# Patient Record
Sex: Female | Born: 1955 | Race: White | Hispanic: No | Smoking: Former smoker
Health system: Southern US, Community
[De-identification: ages and names within clinical notes are randomized; demographics above are authoritative.]

## PROBLEM LIST (undated history)

## (undated) DIAGNOSIS — M810 Age-related osteoporosis without current pathological fracture: Secondary | ICD-10-CM

## (undated) DIAGNOSIS — Z8669 Personal history of other diseases of the nervous system and sense organs: Secondary | ICD-10-CM

## (undated) DIAGNOSIS — T7840XA Allergy, unspecified, initial encounter: Secondary | ICD-10-CM

## (undated) DIAGNOSIS — I452 Bifascicular block: Secondary | ICD-10-CM

## (undated) DIAGNOSIS — I10 Essential (primary) hypertension: Secondary | ICD-10-CM

## (undated) DIAGNOSIS — J439 Emphysema, unspecified: Secondary | ICD-10-CM

## (undated) DIAGNOSIS — J9383 Other pneumothorax: Secondary | ICD-10-CM

## (undated) DIAGNOSIS — H269 Unspecified cataract: Secondary | ICD-10-CM

## (undated) DIAGNOSIS — I2510 Atherosclerotic heart disease of native coronary artery without angina pectoris: Secondary | ICD-10-CM

## (undated) DIAGNOSIS — I351 Nonrheumatic aortic (valve) insufficiency: Secondary | ICD-10-CM

## (undated) HISTORY — DX: Other pneumothorax: J93.83

## (undated) HISTORY — PX: EYE SURGERY: SHX253

## (undated) HISTORY — DX: Emphysema, unspecified: J43.9

## (undated) HISTORY — PX: WISDOM TOOTH EXTRACTION: SHX21

## (undated) HISTORY — DX: Bifascicular block: I45.2

## (undated) HISTORY — PX: COLONOSCOPY: SHX174

## (undated) HISTORY — DX: Age-related osteoporosis without current pathological fracture: M81.0

## (undated) HISTORY — PX: CATARACT EXTRACTION W/ INTRAOCULAR LENS IMPLANT: SHX1309

## (undated) HISTORY — DX: Essential (primary) hypertension: I10

## (undated) HISTORY — DX: Allergy, unspecified, initial encounter: T78.40XA

## (undated) HISTORY — DX: Unspecified cataract: H26.9

## (undated) HISTORY — DX: Personal history of other diseases of the nervous system and sense organs: Z86.69

## (undated) HISTORY — DX: Nonrheumatic aortic (valve) insufficiency: I35.1

## (undated) HISTORY — DX: Atherosclerotic heart disease of native coronary artery without angina pectoris: I25.10

---

## 1999-02-06 ENCOUNTER — Other Ambulatory Visit: Admission: RE | Admit: 1999-02-06 | Discharge: 1999-02-06 | Payer: Self-pay | Admitting: Family Medicine

## 2000-01-29 ENCOUNTER — Other Ambulatory Visit: Admission: RE | Admit: 2000-01-29 | Discharge: 2000-01-29 | Payer: Self-pay | Admitting: Internal Medicine

## 2001-06-14 ENCOUNTER — Other Ambulatory Visit: Admission: RE | Admit: 2001-06-14 | Discharge: 2001-06-14 | Payer: Self-pay | Admitting: Family Medicine

## 2002-08-28 ENCOUNTER — Other Ambulatory Visit: Admission: RE | Admit: 2002-08-28 | Discharge: 2002-08-28 | Payer: Self-pay | Admitting: Family Medicine

## 2003-02-23 DIAGNOSIS — J9383 Other pneumothorax: Secondary | ICD-10-CM

## 2003-02-23 HISTORY — DX: Other pneumothorax: J93.83

## 2003-02-23 HISTORY — PX: CHEST TUBE INSERTION: SHX231

## 2003-02-23 HISTORY — PX: VIDEO ASSISTED THORACOSCOPY: SHX5073

## 2003-09-26 ENCOUNTER — Other Ambulatory Visit: Admission: RE | Admit: 2003-09-26 | Discharge: 2003-09-26 | Payer: Self-pay | Admitting: Family Medicine

## 2003-12-16 ENCOUNTER — Ambulatory Visit (HOSPITAL_COMMUNITY): Admission: RE | Admit: 2003-12-16 | Discharge: 2003-12-16 | Payer: Self-pay | Admitting: Surgery

## 2003-12-16 ENCOUNTER — Inpatient Hospital Stay (HOSPITAL_COMMUNITY): Admission: EM | Admit: 2003-12-16 | Discharge: 2003-12-20 | Payer: Self-pay | Admitting: Emergency Medicine

## 2003-12-24 ENCOUNTER — Encounter: Admission: RE | Admit: 2003-12-24 | Discharge: 2003-12-24 | Payer: Self-pay | Admitting: Surgery

## 2003-12-24 ENCOUNTER — Inpatient Hospital Stay (HOSPITAL_COMMUNITY): Admission: AD | Admit: 2003-12-24 | Discharge: 2003-12-28 | Payer: Self-pay | Admitting: Surgery

## 2003-12-25 ENCOUNTER — Encounter (INDEPENDENT_AMBULATORY_CARE_PROVIDER_SITE_OTHER): Payer: Self-pay | Admitting: Specialist

## 2004-01-02 ENCOUNTER — Encounter: Admission: RE | Admit: 2004-01-02 | Discharge: 2004-01-02 | Payer: Self-pay | Admitting: Cardiothoracic Surgery

## 2004-02-18 ENCOUNTER — Encounter: Admission: RE | Admit: 2004-02-18 | Discharge: 2004-02-18 | Payer: Self-pay | Admitting: Surgery

## 2004-10-05 ENCOUNTER — Other Ambulatory Visit: Admission: RE | Admit: 2004-10-05 | Discharge: 2004-10-05 | Payer: Self-pay | Admitting: Family Medicine

## 2004-10-05 ENCOUNTER — Ambulatory Visit: Payer: Self-pay | Admitting: Family Medicine

## 2005-11-24 ENCOUNTER — Encounter: Payer: Self-pay | Admitting: Family Medicine

## 2005-11-24 ENCOUNTER — Ambulatory Visit: Payer: Self-pay | Admitting: Family Medicine

## 2005-11-24 ENCOUNTER — Other Ambulatory Visit: Admission: RE | Admit: 2005-11-24 | Discharge: 2005-11-24 | Payer: Self-pay | Admitting: Family Medicine

## 2005-12-10 ENCOUNTER — Encounter: Payer: Self-pay | Admitting: Internal Medicine

## 2005-12-10 ENCOUNTER — Ambulatory Visit: Payer: Self-pay

## 2005-12-13 ENCOUNTER — Ambulatory Visit (HOSPITAL_COMMUNITY): Admission: RE | Admit: 2005-12-13 | Discharge: 2005-12-13 | Payer: Self-pay | Admitting: Internal Medicine

## 2005-12-13 ENCOUNTER — Ambulatory Visit: Payer: Self-pay | Admitting: Internal Medicine

## 2005-12-21 ENCOUNTER — Ambulatory Visit: Payer: Self-pay

## 2006-02-06 IMAGING — CR DG CHEST 1V PORT
1 series · 1 of 1 positions shown · non-contrast
Comparison: 12/16/2003.

CLINICAL DATA: Followup right pneumothorax.

PORTABLE CHEST - 1 VIEW

[view not recorded]
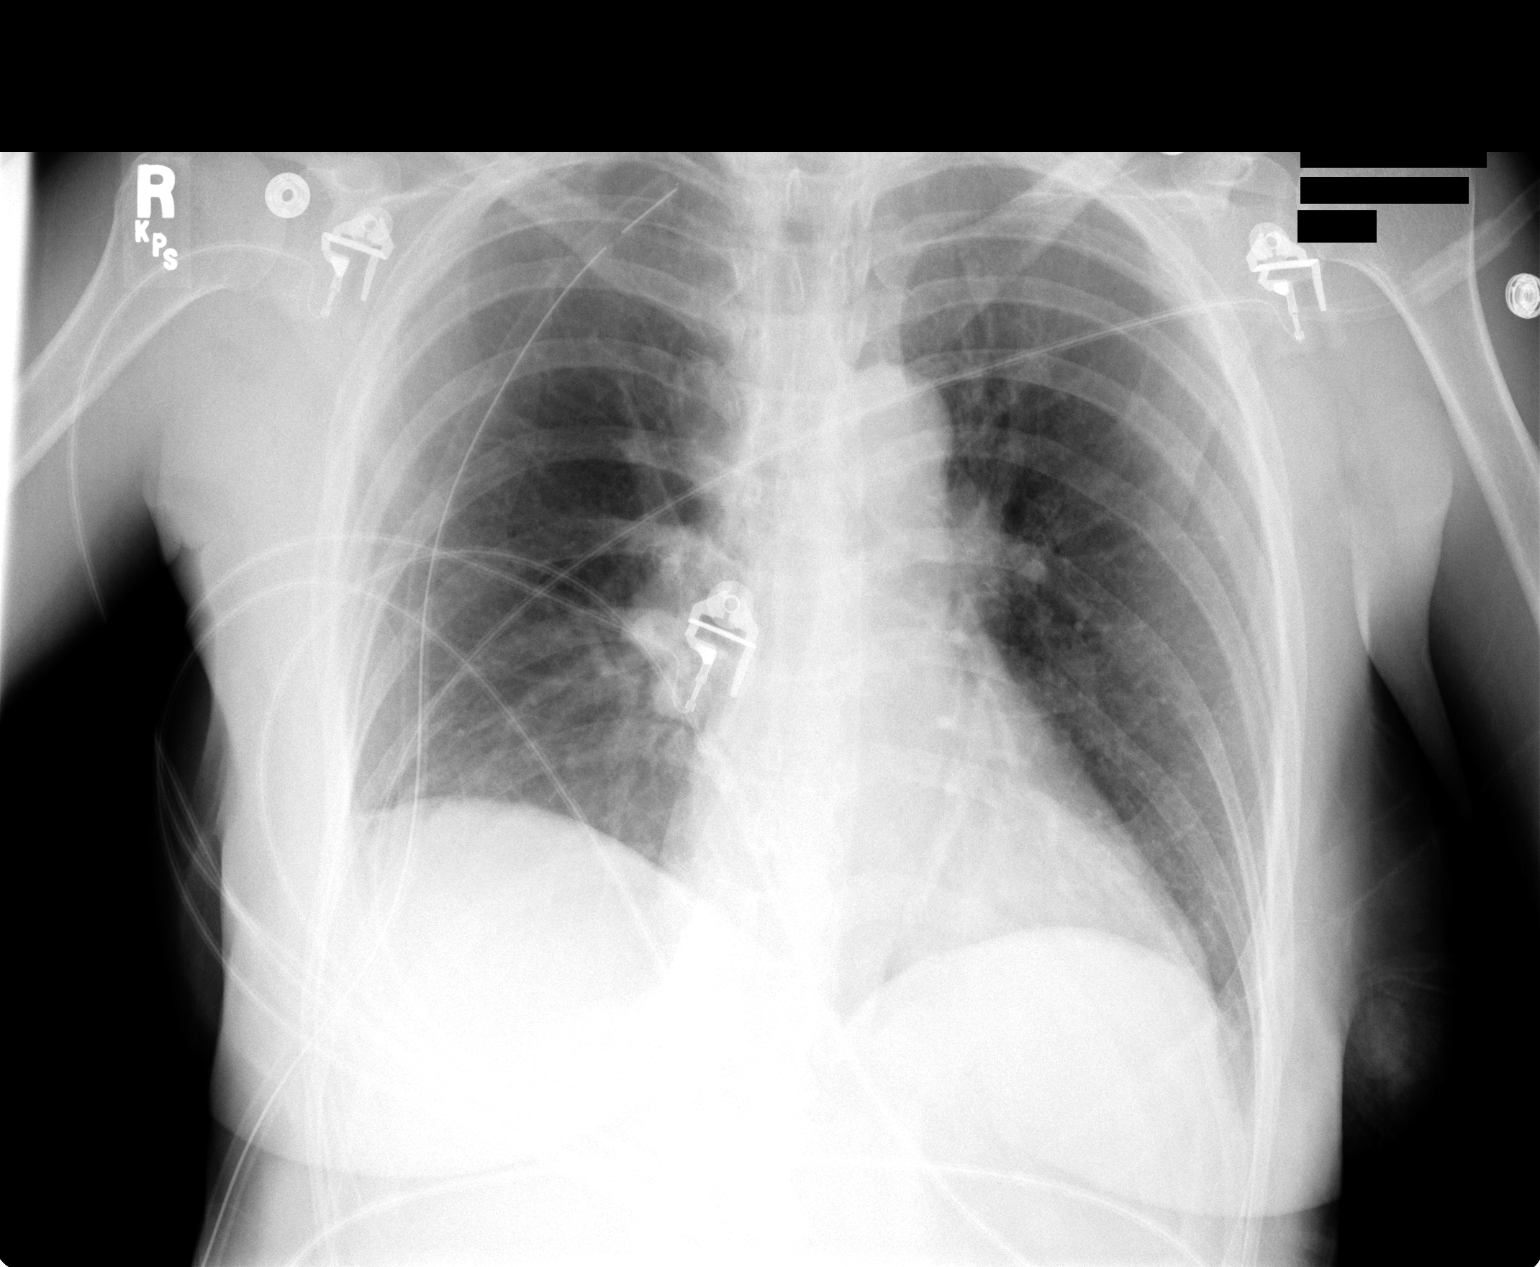

[1 of 1 positions shown; findings below may reference images not displayed]

FINDINGS: Stable small right apical pneumothorax. A right chest tube remains in place. Minimal
atelectasis at the right lateral costophrenic angle. Grossly normal sized heart. Minimally
prominent interstitial markings. Diffuse osteopenia.  

IMPRESSION

1. Stable less than 5% right apical pneumothorax. 

2. Minimal right basilar atelectasis.

3. Minimal chronic interstitial lung disease.

## 2006-02-13 IMAGING — CR DG CHEST 2V
2 series · 2 of 2 positions shown · non-contrast
Comparison: none

CLINICAL DATA: Spontaneous pneumothorax ? follow up.  Some shortness of breath. 
 CHEST X-RAY: 
 Two views of the chest are compared to a chest x-ray of 12/20/03 from [REDACTED].  There is a large right pneumothorax present of 60 to 70% with small right effusion.  The left lung is clear.  This report was called to Dr. Qaracaqiz?[REDACTED] at the time of interpretation.

[view not recorded (1 of 2)]
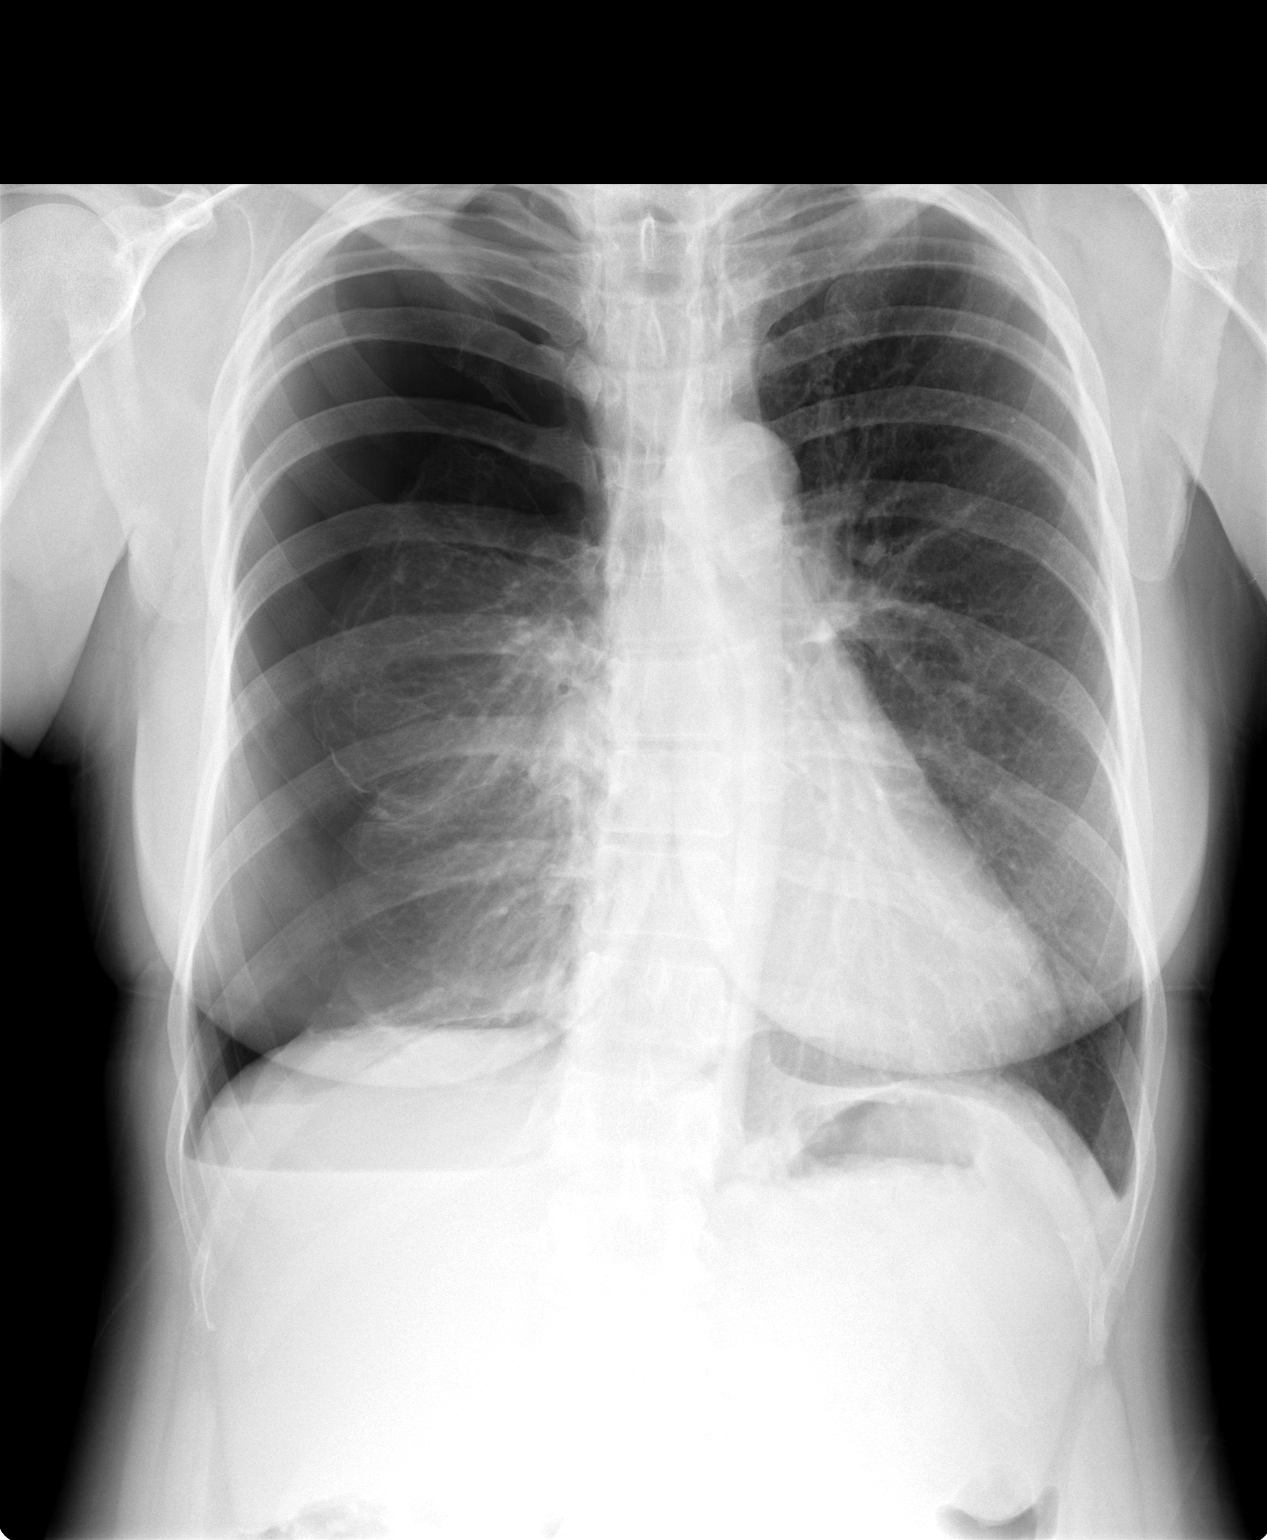

[view not recorded (2 of 2)]
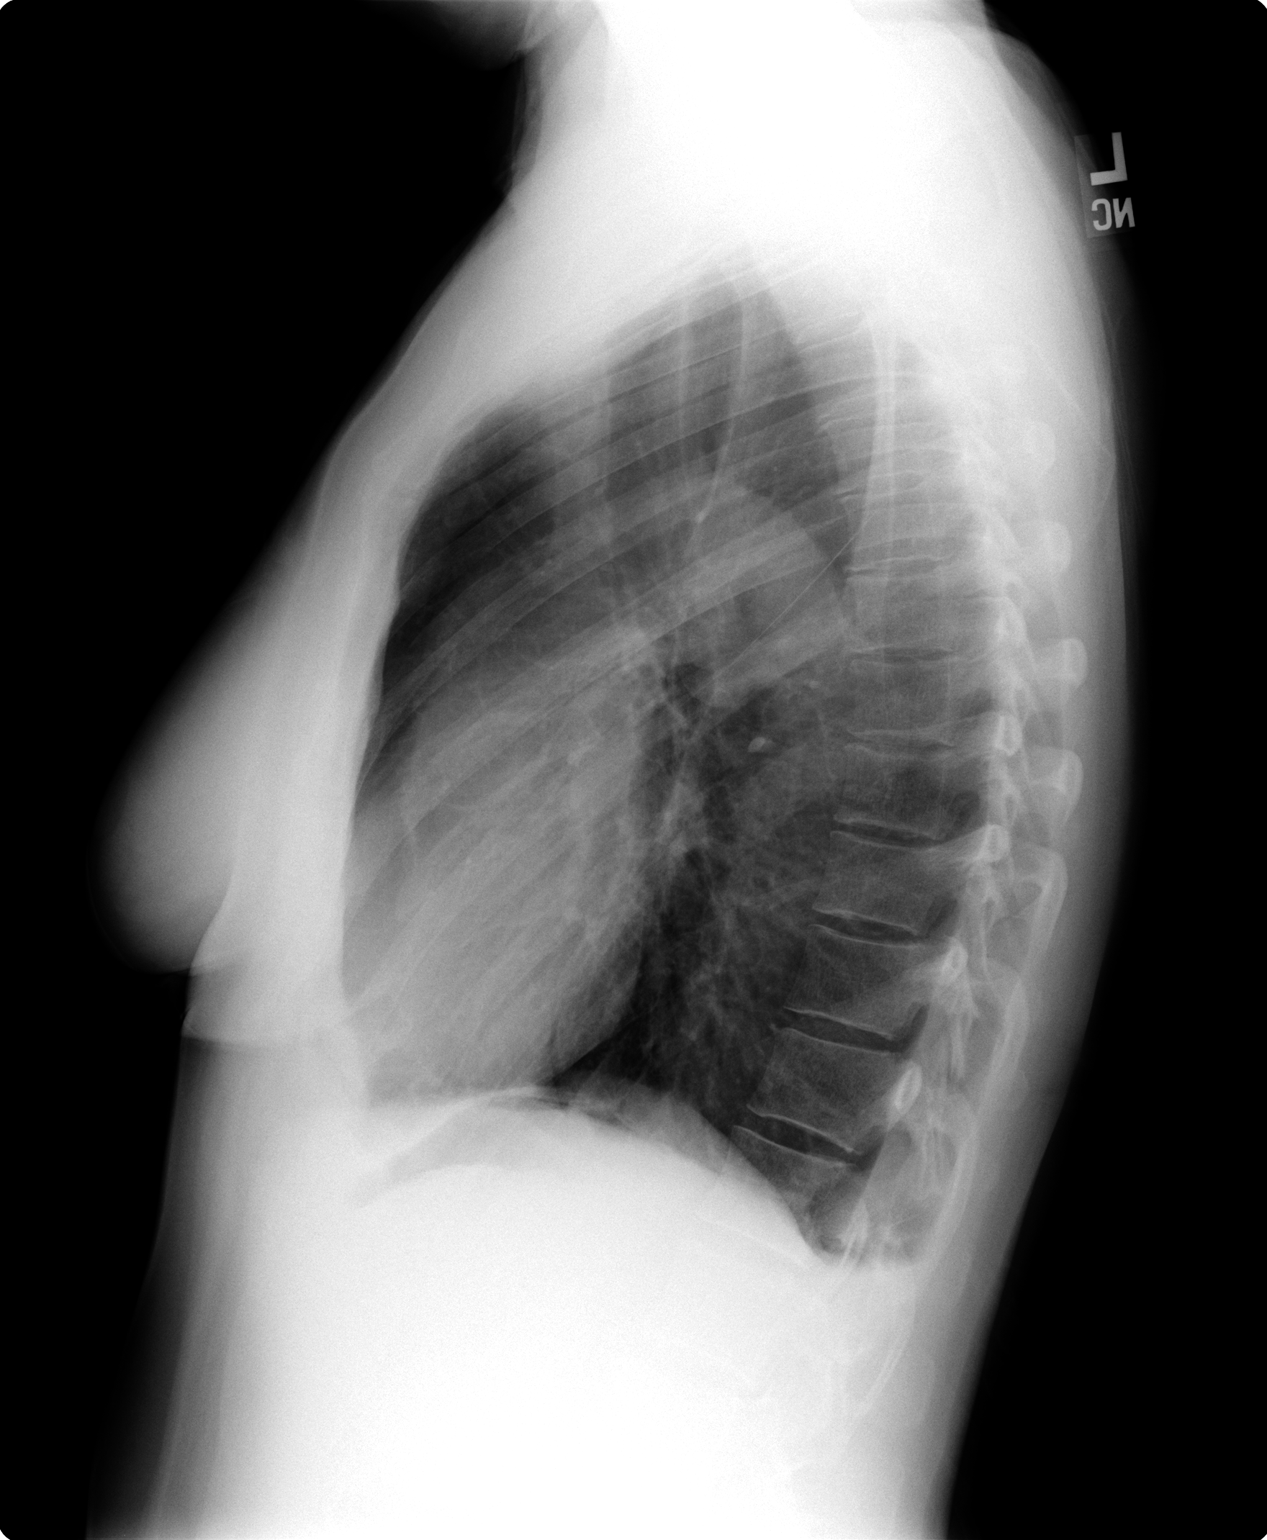

[2 of 2 positions shown; findings below may reference images not displayed]

IMPRESSION: Large right hydropneumothorax of at least 60 to 70%.

## 2006-03-23 ENCOUNTER — Ambulatory Visit: Payer: Self-pay | Admitting: Internal Medicine

## 2006-07-06 ENCOUNTER — Ambulatory Visit: Payer: Self-pay | Admitting: Family Medicine

## 2006-07-06 LAB — CONVERTED CEMR LAB
BUN: 11 mg/dL (ref 6–23)
CO2: 32 meq/L (ref 19–32)
Calcium: 8.9 mg/dL (ref 8.4–10.5)
Chloride: 108 meq/L (ref 96–112)
Creatinine, Ser: 0.7 mg/dL (ref 0.4–1.2)
GFR calc Af Amer: 113 mL/min
GFR calc non Af Amer: 93 mL/min
Glucose, Bld: 100 mg/dL — ABNORMAL HIGH (ref 70–99)
Potassium: 3.9 meq/L (ref 3.5–5.1)
Sodium: 143 meq/L (ref 135–145)

## 2006-10-07 ENCOUNTER — Encounter: Payer: Self-pay | Admitting: Family Medicine

## 2006-10-14 ENCOUNTER — Encounter: Payer: Self-pay | Admitting: Family Medicine

## 2006-12-21 ENCOUNTER — Telehealth (INDEPENDENT_AMBULATORY_CARE_PROVIDER_SITE_OTHER): Payer: Self-pay | Admitting: *Deleted

## 2007-01-12 ENCOUNTER — Telehealth (INDEPENDENT_AMBULATORY_CARE_PROVIDER_SITE_OTHER): Payer: Self-pay | Admitting: *Deleted

## 2007-01-13 ENCOUNTER — Ambulatory Visit: Payer: Self-pay | Admitting: Family Medicine

## 2007-01-13 DIAGNOSIS — N938 Other specified abnormal uterine and vaginal bleeding: Secondary | ICD-10-CM | POA: Insufficient documentation

## 2007-01-13 DIAGNOSIS — N949 Unspecified condition associated with female genital organs and menstrual cycle: Secondary | ICD-10-CM

## 2007-01-13 HISTORY — DX: Other specified abnormal uterine and vaginal bleeding: N93.8

## 2007-01-13 LAB — CONVERTED CEMR LAB: Estradiol: 24.2 pg/mL

## 2007-01-16 ENCOUNTER — Encounter (INDEPENDENT_AMBULATORY_CARE_PROVIDER_SITE_OTHER): Payer: Self-pay | Admitting: *Deleted

## 2007-01-16 LAB — CONVERTED CEMR LAB
Basophils Absolute: 0 10*3/uL (ref 0.0–0.1)
Basophils Relative: 1 % (ref 0–1)
Eosinophils Absolute: 0.2 10*3/uL (ref 0.2–0.7)
Eosinophils Relative: 4 % (ref 0–5)
FSH: 27.4 milliintl units/mL
HCT: 37.5 % (ref 36.0–46.0)
Hemoglobin: 13.2 g/dL (ref 12.0–15.0)
LH: 18.5 milliintl units/mL
Lymphocytes Relative: 34 % (ref 12–46)
Lymphs Abs: 1.7 10*3/uL (ref 0.7–4.0)
MCHC: 35.2 g/dL (ref 30.0–36.0)
MCV: 86.8 fL (ref 78.0–100.0)
Monocytes Absolute: 0.3 10*3/uL (ref 0.1–1.0)
Monocytes Relative: 7 % (ref 3–12)
Neutro Abs: 2.8 10*3/uL (ref 1.7–7.7)
Neutrophils Relative %: 56 % (ref 43–77)
Platelets: 202 10*3/uL (ref 150–400)
RBC: 4.32 M/uL (ref 3.87–5.11)
RDW: 12.2 % (ref 11.5–15.5)
TSH: 1.027 microintl units/mL (ref 0.350–5.50)
WBC: 4.9 10*3/uL (ref 4.0–10.5)

## 2007-01-18 ENCOUNTER — Encounter: Admission: RE | Admit: 2007-01-18 | Discharge: 2007-01-18 | Payer: Self-pay | Admitting: Family Medicine

## 2007-01-18 DIAGNOSIS — D259 Leiomyoma of uterus, unspecified: Secondary | ICD-10-CM

## 2007-01-18 HISTORY — DX: Leiomyoma of uterus, unspecified: D25.9

## 2007-01-27 ENCOUNTER — Encounter: Payer: Self-pay | Admitting: Family Medicine

## 2007-03-24 ENCOUNTER — Ambulatory Visit: Payer: Self-pay | Admitting: Family Medicine

## 2007-03-24 ENCOUNTER — Other Ambulatory Visit: Admission: RE | Admit: 2007-03-24 | Discharge: 2007-03-24 | Payer: Self-pay | Admitting: Family Medicine

## 2007-03-24 ENCOUNTER — Encounter: Payer: Self-pay | Admitting: Family Medicine

## 2007-03-24 DIAGNOSIS — J930 Spontaneous tension pneumothorax: Secondary | ICD-10-CM | POA: Insufficient documentation

## 2007-03-24 DIAGNOSIS — I10 Essential (primary) hypertension: Secondary | ICD-10-CM

## 2007-03-24 HISTORY — DX: Essential (primary) hypertension: I10

## 2007-03-24 HISTORY — DX: Spontaneous tension pneumothorax: J93.0

## 2007-03-28 ENCOUNTER — Telehealth (INDEPENDENT_AMBULATORY_CARE_PROVIDER_SITE_OTHER): Payer: Self-pay | Admitting: *Deleted

## 2007-05-25 ENCOUNTER — Ambulatory Visit: Payer: Self-pay | Admitting: Gastroenterology

## 2007-05-27 ENCOUNTER — Encounter: Payer: Self-pay | Admitting: Family Medicine

## 2007-06-07 ENCOUNTER — Encounter: Payer: Self-pay | Admitting: Family Medicine

## 2007-06-07 ENCOUNTER — Ambulatory Visit: Payer: Self-pay | Admitting: Gastroenterology

## 2007-10-16 ENCOUNTER — Encounter: Payer: Self-pay | Admitting: Family Medicine

## 2008-04-18 ENCOUNTER — Ambulatory Visit: Payer: Self-pay | Admitting: Family Medicine

## 2008-04-18 DIAGNOSIS — R928 Other abnormal and inconclusive findings on diagnostic imaging of breast: Secondary | ICD-10-CM | POA: Insufficient documentation

## 2008-04-26 ENCOUNTER — Ambulatory Visit: Payer: Self-pay | Admitting: Family Medicine

## 2008-04-26 ENCOUNTER — Encounter: Payer: Self-pay | Admitting: Family Medicine

## 2008-04-26 ENCOUNTER — Other Ambulatory Visit: Admission: RE | Admit: 2008-04-26 | Discharge: 2008-04-26 | Payer: Self-pay | Admitting: Internal Medicine

## 2008-05-02 ENCOUNTER — Encounter (INDEPENDENT_AMBULATORY_CARE_PROVIDER_SITE_OTHER): Payer: Self-pay | Admitting: *Deleted

## 2008-10-30 ENCOUNTER — Encounter: Payer: Self-pay | Admitting: Family Medicine

## 2009-05-05 ENCOUNTER — Other Ambulatory Visit: Admission: RE | Admit: 2009-05-05 | Discharge: 2009-05-05 | Payer: Self-pay | Admitting: Family Medicine

## 2009-05-05 ENCOUNTER — Ambulatory Visit: Payer: Self-pay | Admitting: Family Medicine

## 2009-05-13 ENCOUNTER — Encounter (INDEPENDENT_AMBULATORY_CARE_PROVIDER_SITE_OTHER): Payer: Self-pay | Admitting: *Deleted

## 2009-05-13 ENCOUNTER — Ambulatory Visit: Payer: Self-pay | Admitting: Family Medicine

## 2009-05-15 ENCOUNTER — Encounter (INDEPENDENT_AMBULATORY_CARE_PROVIDER_SITE_OTHER): Payer: Self-pay | Admitting: *Deleted

## 2009-05-15 LAB — CONVERTED CEMR LAB: Fecal Occult Bld: NEGATIVE

## 2009-10-31 ENCOUNTER — Encounter: Payer: Self-pay | Admitting: Family Medicine

## 2010-02-20 ENCOUNTER — Encounter: Payer: Self-pay | Admitting: Family Medicine

## 2010-03-14 ENCOUNTER — Encounter: Payer: Self-pay | Admitting: Thoracic Surgery (Cardiothoracic Vascular Surgery)

## 2010-03-22 LAB — CONVERTED CEMR LAB
ALT: 15 units/L (ref 0–35)
ALT: 15 units/L (ref 0–35)
ALT: 17 units/L (ref 0–35)
AST: 15 units/L (ref 0–37)
AST: 16 units/L (ref 0–37)
AST: 20 units/L (ref 0–37)
Albumin: 4 g/dL (ref 3.5–5.2)
Albumin: 4.2 g/dL (ref 3.5–5.2)
Albumin: 4.2 g/dL (ref 3.5–5.2)
Alkaline Phosphatase: 69 units/L (ref 39–117)
Alkaline Phosphatase: 75 units/L (ref 39–117)
Alkaline Phosphatase: 86 units/L (ref 39–117)
BUN: 12 mg/dL (ref 6–23)
BUN: 12 mg/dL (ref 6–23)
BUN: 8 mg/dL (ref 6–23)
Basophils Absolute: 0 10*3/uL (ref 0.0–0.1)
Basophils Absolute: 0 10*3/uL (ref 0.0–0.1)
Basophils Absolute: 0 10*3/uL (ref 0.0–0.1)
Basophils Relative: 0.1 % (ref 0.0–3.0)
Basophils Relative: 0.2 % (ref 0.0–1.0)
Basophils Relative: 0.8 % (ref 0.0–3.0)
Bilirubin Urine: NEGATIVE
Bilirubin Urine: NEGATIVE
Bilirubin, Direct: 0.1 mg/dL (ref 0.0–0.3)
Bilirubin, Direct: 0.1 mg/dL (ref 0.0–0.3)
Bilirubin, Direct: 0.1 mg/dL (ref 0.0–0.3)
Blood in Urine, dipstick: NEGATIVE
Blood in Urine, dipstick: NEGATIVE
CO2: 31 meq/L (ref 19–32)
CO2: 31 meq/L (ref 19–32)
CO2: 32 meq/L (ref 19–32)
Calcium: 8.9 mg/dL (ref 8.4–10.5)
Calcium: 9.4 mg/dL (ref 8.4–10.5)
Calcium: 9.6 mg/dL (ref 8.4–10.5)
Chloride: 105 meq/L (ref 96–112)
Chloride: 106 meq/L (ref 96–112)
Chloride: 107 meq/L (ref 96–112)
Cholesterol: 165 mg/dL (ref 0–200)
Cholesterol: 177 mg/dL (ref 0–200)
Cholesterol: 189 mg/dL (ref 0–200)
Creatinine, Ser: 0.6 mg/dL (ref 0.4–1.2)
Creatinine, Ser: 0.7 mg/dL (ref 0.4–1.2)
Creatinine, Ser: 0.8 mg/dL (ref 0.4–1.2)
Eosinophils Absolute: 0.2 10*3/uL (ref 0.0–0.6)
Eosinophils Absolute: 0.2 10*3/uL (ref 0.0–0.7)
Eosinophils Absolute: 0.2 10*3/uL (ref 0.0–0.7)
Eosinophils Relative: 3.8 % (ref 0.0–5.0)
Eosinophils Relative: 4.1 % (ref 0.0–5.0)
Eosinophils Relative: 4.4 % (ref 0.0–5.0)
GFR calc Af Amer: 113 mL/min
GFR calc Af Amer: 96 mL/min
GFR calc non Af Amer: 109.87 mL/min (ref >60)
GFR calc non Af Amer: 79 mL/min
GFR calc non Af Amer: 93 mL/min
Glucose, Bld: 90 mg/dL (ref 70–99)
Glucose, Bld: 99 mg/dL (ref 70–99)
Glucose, Bld: 99 mg/dL (ref 70–99)
Glucose, Urine, Semiquant: NEGATIVE
Glucose, Urine, Semiquant: NEGATIVE
HCT: 39.2 % (ref 36.0–46.0)
HCT: 39.3 % (ref 36.0–46.0)
HCT: 40.2 % (ref 36.0–46.0)
HDL: 58.7 mg/dL (ref >39.0)
HDL: 58.8 mg/dL (ref >39.0)
HDL: 64.9 mg/dL (ref >39.00)
Hemoglobin: 13.1 g/dL (ref 12.0–15.0)
Hemoglobin: 13.2 g/dL (ref 12.0–15.0)
Hemoglobin: 13.9 g/dL (ref 12.0–15.0)
Ketones, urine, test strip: NEGATIVE
Ketones, urine, test strip: NEGATIVE
LDL Cholesterol: 114 mg/dL — ABNORMAL HIGH (ref 0–99)
LDL Cholesterol: 96 mg/dL (ref 0–99)
LDL Cholesterol: 98 mg/dL (ref 0–99)
Lymphocytes Relative: 32.8 % (ref 12.0–46.0)
Lymphocytes Relative: 34.7 % (ref 12.0–46.0)
Lymphocytes Relative: 36.6 % (ref 12.0–46.0)
Lymphs Abs: 1.6 10*3/uL (ref 0.7–4.0)
MCHC: 33.6 g/dL (ref 30.0–36.0)
MCHC: 33.6 g/dL (ref 30.0–36.0)
MCHC: 34.6 g/dL (ref 30.0–36.0)
MCV: 89.8 fL (ref 78.0–100.0)
MCV: 90.1 fL (ref 78.0–100.0)
MCV: 91 fL (ref 78.0–100.0)
Monocytes Absolute: 0.3 10*3/uL (ref 0.1–1.0)
Monocytes Absolute: 0.3 10*3/uL (ref 0.2–0.7)
Monocytes Absolute: 0.4 10*3/uL (ref 0.1–1.0)
Monocytes Relative: 6.8 % (ref 3.0–11.0)
Monocytes Relative: 6.9 % (ref 3.0–12.0)
Monocytes Relative: 7.4 % (ref 3.0–12.0)
Neutro Abs: 2.3 10*3/uL (ref 1.4–7.7)
Neutro Abs: 2.6 10*3/uL (ref 1.4–7.7)
Neutro Abs: 2.8 10*3/uL (ref 1.4–7.7)
Neutrophils Relative %: 52 % (ref 43.0–77.0)
Neutrophils Relative %: 54.5 % (ref 43.0–77.0)
Neutrophils Relative %: 54.9 % (ref 43.0–77.0)
Nitrite: NEGATIVE
Nitrite: NEGATIVE
Pap Smear: NEGATIVE
Platelets: 202 10*3/uL (ref 150.0–400.0)
Platelets: 218 10*3/uL (ref 150–400)
Platelets: 224 10*3/uL (ref 150–400)
Potassium: 3.5 meq/L (ref 3.5–5.1)
Potassium: 4.2 meq/L (ref 3.5–5.1)
Potassium: 5.1 meq/L (ref 3.5–5.1)
Protein, U semiquant: NEGATIVE
Protein, U semiquant: NEGATIVE
RBC: 4.32 M/uL (ref 3.87–5.11)
RBC: 4.36 M/uL (ref 3.87–5.11)
RBC: 4.46 M/uL (ref 3.87–5.11)
RDW: 11.5 % (ref 11.5–14.6)
RDW: 11.5 % (ref 11.5–14.6)
RDW: 12.2 % (ref 11.5–14.6)
Sodium: 142 meq/L (ref 135–145)
Sodium: 142 meq/L (ref 135–145)
Sodium: 144 meq/L (ref 135–145)
Specific Gravity, Urine: <1.005
Specific Gravity, Urine: <1.005
TSH: 0.52 microintl units/mL (ref 0.35–5.50)
TSH: 0.9 microintl units/mL (ref 0.35–5.50)
TSH: 1.08 microintl units/mL (ref 0.35–5.50)
Total Bilirubin: 0.5 mg/dL (ref 0.3–1.2)
Total Bilirubin: 0.8 mg/dL (ref 0.3–1.2)
Total Bilirubin: 1.1 mg/dL (ref 0.3–1.2)
Total CHOL/HDL Ratio: 2.8
Total CHOL/HDL Ratio: 3
Total CHOL/HDL Ratio: 3.2
Total Protein: 7.1 g/dL (ref 6.0–8.3)
Total Protein: 7.5 g/dL (ref 6.0–8.3)
Total Protein: 7.7 g/dL (ref 6.0–8.3)
Triglycerides: 43 mg/dL (ref 0–149)
Triglycerides: 79 mg/dL (ref 0.0–149.0)
Triglycerides: 82 mg/dL (ref 0–149)
Urobilinogen, UA: NEGATIVE
Urobilinogen, UA: NEGATIVE
VLDL: 15.8 mg/dL (ref 0.0–40.0)
VLDL: 16 mg/dL (ref 0–40)
VLDL: 9 mg/dL (ref 0–40)
WBC Urine, dipstick: NEGATIVE
WBC Urine, dipstick: NEGATIVE
WBC: 4.4 10*3/uL — ABNORMAL LOW (ref 4.5–10.5)
WBC: 4.8 10*3/uL (ref 4.5–10.5)
WBC: 5 10*3/uL (ref 4.5–10.5)
pH: 5
pH: 6.5

## 2010-03-24 NOTE — Letter (Signed)
Summary: Results Follow up Letter  [facility] [place]  [address] [place], [place] [address]   Phone: [phone]  Fax: [phone]    05/15/2009 MRN: [id number]  [facility] [address] [place], [place]  [address]  Dear [name],  The following are the results of your recent test(s):  Test         Result    Pap Smear:        Normal _____  Not Normal _____ Comments: ______________________________________________________ Cholesterol: LDL(Bad cholesterol):         Your goal is less than:         HDL (Good cholesterol):       Your goal is more than: Comments:  ______________________________________________________ Mammogram:        Normal _____  Not Normal _____ Comments:  ___________________________________________________________________ Hemoccult:        Normal __X___  Not normal _______ Comments:    _____________________________________________________________________ Other Tests:    We routinely do not discuss normal results over the telephone.  If you desire a copy of the results, or you have any questions about this information we can discuss them at your next office visit.   Sincerely,

## 2010-03-24 NOTE — Assessment & Plan Note (Signed)
Summary: CPX & PAP, FASTING/RH......   Vital Signs:  Patient profile:   55 year old female Height:      63 inches Weight:      126 pounds BMI:     22.40 Temp:     97.7 degrees F oral Pulse rate:   65 / minute Pulse rhythm:   regular BP sitting:   124 / 72  (left arm) Cuff size:   regular  Vitals Entered By: [name] CMA (May 05, 2009 9:00 AM) CC: CPX, pap, [name] is fasting.   History of Present Illness: [name] here for cpe, labs, and pap.   [name] only complaint irregular periods---  she had 8 this year.  [name] saw [facility] gyn a few years ago.  They offered to start low dose hormones but she put is off then.  [name] will f/u with them if symptoms worsen.    Hypertension follow-up      This is a 55 year old woman who presents for Hypertension follow-up.  The patient denies lightheadedness, urinary frequency, headaches, edema, impotence, rash, and fatigue.  The patient denies the following associated symptoms: chest pain, chest pressure, exercise intolerance, dyspnea, palpitations, syncope, leg edema, and pedal edema.  Compliance with medications (by patient report) has been near 100%.  The patient reports that dietary compliance has been good.  The patient reports exercising 3-4X per week.  Adjunctive measures currently used by the patient include salt restriction.    Preventive Screening-Counseling & Management  Alcohol-Tobacco     Alcohol drinks/day: <1     Alcohol type: beer/ wine     Smoking Status: quit     Packs/Day: 1/2     Year Started: 1970     Year Quit: 1974     Pack years: 7     Passive Smoke Exposure: no  Caffeine-Diet-Exercise     Caffeine use/day: 1     Does Patient Exercise: yes     Type of exercise: jazzercise     Exercise (avg: min/session): 30-60     Times/week: 4  Hep-HIV-STD-Contraception     HIV Risk: no     Dental Visit-last 6 months yes     Dental Care Counseling: not indicated; dental care within six months     SBE monthly: yes     SBE  Education/Counseling: not indicated; SBE done regularly  Safety-Violence-Falls     Seat Belt Use: 100      Sexual History:  currently monogamous.    Current Medications (verified): 1)  Spironolactone 25 Mg  Tabs (Spironolactone) .Marland Kitchen.. 1 By Mouth Once Daily 2)  Diovan 160 Mg  Tabs (Valsartan) .Marland Kitchen.. 1 By Mouth Once Daily 3)  Flonase 50 Mcg/act Susp (Fluticasone Propionate) .... 2 Sprays Each Nostril Once Daily  Allergies (verified): No Known Drug Allergies  Past History:  Past Medical History: Last updated: 03/24/2007 Hypertension  Past Surgical History: Last updated: 03/24/2007 Caesarean section Tonsillectomy gum grafts  Family History: Last updated: 03/24/2007 MGM-- breast CA 72 Family History of Stroke F 1st degree relative 106 Family History of CAD Female 1st degree relative 1st MI in 52s Family History Hypertension  Social History: Last updated: 03/24/2007 Occupation: Musician for middle school Married Former Smoker Alcohol use-yes Drug use-no Regular exercise-yes  Risk Factors: Alcohol Use: <1 (05/05/2009) Caffeine Use: 1 (05/05/2009) Exercise: yes (05/05/2009)  Risk Factors: Smoking Status: quit (05/05/2009) Packs/Day: 1/2 (05/05/2009) Passive Smoke Exposure: no (05/05/2009)  Family History: Reviewed history from 03/24/2007 and no changes required. MGM-- breast  CA 72 Family History of Stroke F 1st degree relative 45 Family History of CAD Female 1st degree relative 1st MI in 48s Family History Hypertension  Social History: Reviewed history from 03/24/2007 and no changes required. 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 Eyes:  Denies blurring, discharge, double  vision, eye irritation, eye pain, halos, itching, light sensitivity, red eye, vision loss-1 eye, and vision loss-both eyes; optho-q2y. ENT:  Denies decreased hearing, difficulty swallowing, ear discharge, earache, hoarseness, nasal congestion, nosebleeds, postnasal drainage, ringing in ears, sinus pressure, and sore throat. CV:  Denies bluish discoloration of lips or nails, chest pain or discomfort, difficulty breathing at night, difficulty breathing while lying down, fainting, fatigue, leg cramps with exertion, lightheadness, near fainting, palpitations, shortness of breath with exertion, swelling of feet, swelling of hands, and weight gain. Resp:  Denies chest discomfort, chest pain with inspiration, cough, coughing up blood, excessive snoring, hypersomnolence, morning headaches, pleuritic, shortness of breath, sputum productive, and wheezing. GI:  Denies abdominal pain, bloody stools, change in bowel habits, constipation, dark tarry stools, diarrhea, excessive appetite, gas, hemorrhoids, indigestion, and loss of appetite. GU:  Complains of abnormal vaginal bleeding; denies decreased libido, discharge, dysuria, genital sores, hematuria, incontinence, nocturia, urinary frequency, and urinary hesitancy; periods are spreading out and irregular--[name] to see gyn. MS:  Denies joint pain, joint redness, joint swelling, loss of strength, low back pain, mid back pain, muscle aches, muscle , cramps, muscle weakness, stiffness, and thoracic pain. Derm:  Denies changes in color of skin, changes in nail beds, dryness, excessive perspiration, flushing, hair loss, insect bite(s), itching, lesion(s), poor wound healing, and rash. Neuro:  Denies brief paralysis, difficulty with concentration, disturbances in coordination, falling down, headaches, inability to speak, memory loss, numbness, poor balance, seizures, sensation of room spinning, tingling, tremors, visual disturbances, and weakness. Psych:  Denies alternate  hallucination ( auditory/visual), anxiety, depression, easily angered, easily tearful, irritability, mental problems, panic attacks, sense of great danger, suicidal thoughts/plans, thoughts of violence, unusual visions or sounds, and thoughts /plans of harming others. Endo:  Denies cold intolerance, excessive hunger, excessive thirst, excessive urination, heat intolerance, polyuria, and weight change. Heme:  Denies abnormal bruising, bleeding, enlarge lymph nodes, fevers, pallor, and skin discoloration.  Physical Exam  General:  Well-developed,well-nourished,in no acute distress; alert,appropriate and cooperative throughout examination Head:  Normocephalic and atraumatic without obvious abnormalities. No apparent alopecia or balding. Eyes:  vision grossly intact, pupils equal, pupils round, pupils reactive to light, and no injection.   Ears:  External ear exam shows no significant lesions or deformities.  Otoscopic examination reveals clear canals, tympanic membranes are intact bilaterally without bulging, retraction, inflammation or discharge. Hearing is grossly normal bilaterally. Nose:  External nasal examination shows no deformity or inflammation. Nasal mucosa are pink and moist without lesions or exudates. Mouth:  Oral mucosa and oropharynx without lesions or exudates.  Teeth in good repair. Neck:  No deformities, masses, or tenderness noted.no carotid bruits.   Chest Wall:  No deformities, masses, or tenderness noted. Breasts:  No mass, nodules, thickening, tenderness, bulging, retraction, inflamation, nipple discharge or skin changes noted.   Lungs:  Normal respiratory effort, chest expands symmetrically. Lungs are clear to auscultation, no crackles or wheezes. Heart:  Normal  rate and regular rhythm. S1 and S2 normal without gallop, murmur, click, rub or other extra sounds. Abdomen:  Bowel sounds positive,abdomen soft and non-tender without masses, organomegaly or hernias noted. Rectal:  No  external abnormalities noted. Normal sphincter tone. No rectal masses or tenderness. Genitalia:  Pelvic Exam:        External: normal female genitalia without lesions or masses        Vagina: normal without lesions or masses        Cervix: normal without lesions or masses        Adnexa: normal bimanual exam without masses or fullness        Uterus: normal by palpation        Pap smear: performed Msk:  normal ROM, no joint tenderness, no joint swelling, and no joint warmth.   Pulses:  R posterior tibial normal, R dorsalis pedis normal, R carotid normal, L posterior tibial normal, L dorsalis pedis normal, and L carotid normal.   Extremities:  No clubbing, cyanosis, edema, or deformity noted with normal full range of motion of all joints.   Neurologic:  No cranial nerve deficits noted. Station and gait are normal. Plantar reflexes are down-going bilaterally. DTRs are symmetrical throughout. Sensory, motor and coordinative functions appear intact. Skin:  Intact without suspicious lesions or rashes Cervical Nodes:  No lymphadenopathy noted Axillary Nodes:  No palpable lymphadenopathy Psych:  Oriented X3, normally interactive, good eye contact, not anxious appearing, and not depressed appearing.     Impression & Recommendations:  Problem # 1:  PREVENTIVE HEALTH CARE (ICD-V70.0) GHM utd sbe mammo done  Orders: Venipuncture (54098) TLB-Lipid Panel (80061-LIPID) TLB-BMP (Basic Metabolic Panel-BMET) (80048-METABOL) TLB-CBC Platelet - w/Differential (85025-CBCD) TLB-Hepatic/Liver Function Pnl (80076-HEPATIC) TLB-TSH (Thyroid Stimulating Hormone) (84443-TSH) EKG w/ Interpretation (93000)  Problem # 2:  HYPERTENSION (ICD-401.9)  Her updated medication list for this problem includes:    Spironolactone 25 Mg Tabs (Spironolactone) .Marland Kitchen... 1 by mouth once daily    Diovan 160 Mg Tabs (Valsartan) .Marland Kitchen... 1 by mouth once daily  BP today: 124/72 Prior BP: 130/78 (04/26/2008)  Labs Reviewed: K+:  5.1 (04/18/2008) Creat: : 0.8 (04/18/2008)   Chol: 189 (04/18/2008)   HDL: 58.7 (04/18/2008)   LDL: 114 (04/18/2008)   TG: 82 (04/18/2008)  Orders: EKG w/ Interpretation (93000)  Complete Medication List: 1)  Spironolactone 25 Mg Tabs (Spironolactone) .Marland Kitchen.. 1 by mouth once daily 2)  Diovan 160 Mg Tabs (Valsartan) .Marland Kitchen.. 1 by mouth once daily 3)  Flonase 50 Mcg/act Susp (Fluticasone propionate) .... 2 sprays each nostril once daily Prescriptions: FLONASE 50 MCG/ACT SUSP (FLUTICASONE PROPIONATE) 2 sprays each nostril once daily  #1 x 11   Entered and Authorized by:   Loreen Freud DO   Signed by:   Loreen Freud DO on 05/05/2009   Method used:   Electronically to        [id number] (retail)       [address]       [place], [place]  [address]       Ph: [phone] or [phone]       Fax: [phone]   RxID:   [id number] DIOVAN 160 MG  TABS (VALSARTAN) 1 by mouth once daily  #30 Tablet x 5   Entered and Authorized by:   Loreen Freud DO   Signed by:   Loreen Freud DO on 05/05/2009   Method used:   Electronically to  [facility]  [address] [id number]* (retail)       [address]       [place], [place]  [address]       Ph: [phone] or [phone]       Fax: [phone]   RxID:   [id number] SPIRONOLACTONE 25 MG  TABS (SPIRONOLACTONE) 1 by mouth once daily  #30 Tablet x 11   Entered and Authorized by:   Loreen Freud DO   Signed by:   Loreen Freud DO on 05/05/2009   Method used:   Electronically to        [id number] (retail)       [address]       [place], [place]  [address]       Ph: [phone] or [phone]       Fax: [phone]   RxID:   [id number]    EKG  Procedure date:  05/05/2009  Findings:      Sinus bradycardia with rate of:  49  EKG  Procedure date:  05/05/2009  Findings:      Right bundle branch block.       Last Flu Vaccine:   Fluvax 3+ (12/13/2007 9:41:57 AM) Flu Vaccine Result Date:  01/01/2009 Flu Vaccine Result:  given Flu Vaccine Next Due:  1 yr Last Mammogram:  Normal Bilateral---Dense breast -[facility] recc (10/10/2007 9:38:27 AM) Mammogram Result Date:  10/09/2008 Mammogram Result:  normal Mammogram Next Due:  1 yr  Appended Document: CPX & PAP, FASTING/RH......     Clinical Lists Changes  Observations: Added new observation of COMMENTS: [name] CMA  May 05, 2009 10:53 AM  (05/05/2009 10:53) Added new observation of PH URINE: 6.0  (05/05/2009 10:53) Added new observation of SPEC GR URIN: 1.015  (05/05/2009 10:53) Added new observation of APPEARANCE U: Clear  (05/05/2009 10:53) Added new observation of UA COLOR: yellow  (05/05/2009 10:53) Added new observation of WBC DIPSTK U: negative  (05/05/2009 10:53) Added new observation of NITRITE URN: negative  (05/05/2009 10:53) Added new observation of UROBILINOGEN: 0.2  (05/05/2009 10:53) Added new observation of PROTEIN, URN: negative  (05/05/2009 10:53) Added new observation of BLOOD UR DIP: negative  (05/05/2009 10:53) Added new observation of KETONES URN: negative  (05/05/2009 10:53) Added new observation of BILIRUBIN UR: negative  (05/05/2009 10:53) Added new observation of GLUCOSE, URN: negative  (05/05/2009 10:53)      Laboratory Results   Urine Tests    Routine Urinalysis   Color: yellow Appearance: Clear Glucose: negative   (Normal Range: Negative) Bilirubin: negative   (Normal Range: Negative) Ketone: negative   (Normal Range: Negative) Spec. Gravity: 1.015   (Normal Range: 1.003-1.035) Blood: negative   (Normal Range: Negative) pH: 6.0   (Normal Range: 5.0-8.0) Protein: negative   (Normal Range: Negative) Urobilinogen: 0.2   (Normal Range: 0-1) Nitrite: negative   (Normal Range: Negative) Leukocyte Esterace: negative   (Normal Range: Negative)    Comments: [name] CMA  May 05, 2009 10:53 AM

## 2010-03-24 NOTE — Letter (Signed)
Summary: [facility]: Immunoassay Fecal Occult Blood (iFOB) Order Form  [facility] [place]  [address] [place], [place] [address]   Phone: [phone]  Fax: [phone]      [facility]: Immunoassay Fecal Occult Blood (iFOB) Order Form   May 13, 2009 MRN: [id number]   [name] April 20, 1955   Physicican Name:_____Yvonne Lowne,DO____________________  Diagnosis Code:_____v76.51_____________________      [name] CMA

## 2010-03-26 NOTE — Medication Information (Signed)
Summary: Care Consideration Regarding Drugs Known to Cause Hyperkalemia/N  Care Consideration Regarding Drugs Known to Cause Hyperkalemia/[facility]   Imported By: Lanelle Bal 03/06/2010 13:53:25  _____________________________________________________________________  External Attachment:    Type:   Image     Comment:   External Document

## 2010-05-07 ENCOUNTER — Encounter: Payer: Self-pay | Admitting: Family Medicine

## 2010-05-07 ENCOUNTER — Other Ambulatory Visit: Payer: Self-pay | Admitting: Family Medicine

## 2010-05-07 ENCOUNTER — Other Ambulatory Visit (HOSPITAL_COMMUNITY)
Admission: RE | Admit: 2010-05-07 | Discharge: 2010-05-07 | Disposition: A | Discharge status: Home / Self Care | Payer: BC Managed Care – PPO | Source: Ambulatory Visit | Attending: Family Medicine | Admitting: Family Medicine

## 2010-05-07 ENCOUNTER — Encounter (INDEPENDENT_AMBULATORY_CARE_PROVIDER_SITE_OTHER): Payer: BC Managed Care – PPO | Admitting: Family Medicine

## 2010-05-07 DIAGNOSIS — D239 Other benign neoplasm of skin, unspecified: Secondary | ICD-10-CM

## 2010-05-07 DIAGNOSIS — Z0000 Encounter for general adult medical examination without abnormal findings: Secondary | ICD-10-CM

## 2010-05-07 DIAGNOSIS — I10 Essential (primary) hypertension: Secondary | ICD-10-CM

## 2010-05-07 DIAGNOSIS — Z01419 Encounter for gynecological examination (general) (routine) without abnormal findings: Secondary | ICD-10-CM | POA: Insufficient documentation

## 2010-05-07 DIAGNOSIS — E785 Hyperlipidemia, unspecified: Secondary | ICD-10-CM

## 2010-05-07 HISTORY — DX: Other benign neoplasm of skin, unspecified: D23.9

## 2010-05-07 LAB — LIPID PANEL
Cholesterol: 202 mg/dL — ABNORMAL HIGH (ref 0–200)
Total CHOL/HDL Ratio: 3
VLDL: 14.8 mg/dL (ref 0.0–40.0)

## 2010-05-07 LAB — BASIC METABOLIC PANEL
CO2: 31 mEq/L (ref 19–32)
Chloride: 103 mEq/L (ref 96–112)
Creatinine, Ser: 0.6 mg/dL (ref 0.4–1.2)
Potassium: 3.5 mEq/L (ref 3.5–5.1)

## 2010-05-07 LAB — CBC WITH DIFFERENTIAL/PLATELET
Basophils Relative: 0.7 % (ref 0.0–3.0)
Eosinophils Absolute: 0.2 10*3/uL (ref 0.0–0.7)
Eosinophils Relative: 3.1 % (ref 0.0–5.0)
HCT: 39.7 % (ref 36.0–46.0)
Hemoglobin: 14 g/dL (ref 12.0–15.0)
Lymphs Abs: 2.1 10*3/uL (ref 0.7–4.0)
MCHC: 35.3 g/dL (ref 30.0–36.0)
MCV: 89.4 fl (ref 78.0–100.0)
Monocytes Absolute: 0.3 10*3/uL (ref 0.1–1.0)
Neutro Abs: 2.7 10*3/uL (ref 1.4–7.7)
Neutrophils Relative %: 51.3 % (ref 43.0–77.0)
RBC: 4.44 Mil/uL (ref 3.87–5.11)
WBC: 5.3 10*3/uL (ref 4.5–10.5)

## 2010-05-07 LAB — LDL CHOLESTEROL, DIRECT: Direct LDL: 111.7 mg/dL

## 2010-05-07 LAB — CONVERTED CEMR LAB
Blood in Urine, dipstick: NEGATIVE
Glucose, Urine, Semiquant: NEGATIVE
Nitrite: NEGATIVE
Specific Gravity, Urine: <1.005
WBC Urine, dipstick: NEGATIVE
pH: 6

## 2010-05-07 LAB — HEPATIC FUNCTION PANEL
ALT: 13 U/L (ref 0–35)
Albumin: 4.5 g/dL (ref 3.5–5.2)
Bilirubin, Direct: 0.1 mg/dL (ref 0.0–0.3)
Total Protein: 7.3 g/dL (ref 6.0–8.3)

## 2010-05-07 LAB — TSH: TSH: 0.73 u[IU]/mL (ref 0.35–5.50)

## 2010-05-12 NOTE — Assessment & Plan Note (Signed)
Summary: cpx/pt will be fasting/lch   Vital Signs:  Patient profile:   55 year old female LMP:     04/21/2010 Height:      63 inches Weight:      124 pounds BMI:     22.05 Temp:     98.6 degrees F oral Pulse rate:   56 / minute Resp:     18 per minute BP sitting:   130 / 82  (left arm)  Vitals Entered By: Jeremy Johann CMA (May 07, 2010 1:04 PM) CC: cpx, fasting, pap LMP (date): 04/21/2010     Enter LMP: 04/21/2010 Last PAP Result NEGATIVE FOR INTRAEPITHELIAL LESIONS OR MALIGNANCY.   History of Present Illness: Pt here for cpe, pap and fasting labs.    No complaints.     Preventive Screening-Counseling & Management  Alcohol-Tobacco     Alcohol drinks/day: <1     Alcohol type: beer/ wine     Smoking Status: quit     Packs/Day: 1/2     Year Started: 1970     Year Quit: 1974     Pack years: 7     Passive Smoke Exposure: no  Caffeine-Diet-Exercise     Caffeine use/day: 1     Does Patient Exercise: yes     Type of exercise: jazzercise     Exercise (avg: min/session): 30-60     Times/week: 4  Hep-HIV-STD-Contraception     HIV Risk: no     Dental Visit-last 6 months yes     Dental Care Counseling: not indicated; dental care within six months     SBE monthly: yes     SBE Education/Counseling: not indicated; SBE done regularly  Safety-Violence-Falls     Seat Belt Use: 100      Sexual History:  currently monogamous.        Drug Use:  no.    Problems Prior to Update: 1)  Mole  (ICD-216.9) 2)  Routine Gynecological Examination  (ICD-V72.31) 3)  Mammogram, Abnormal  (ICD-793.80) 4)  Preventive Health Care  (ICD-V70.0) 5)  Family History of Cad Female 1st Degree Relative <50  (ICD-V17.3) 6)  Spontaneous Tension Pneumothorax  (ICD-512.0) 7)  Hypertension  (ICD-401.9) 8)  Fibroids, Uterus  (ICD-218.9) 9)  Menorrhagia, Perimenopausal  (ICD-626.8)  Medications Prior to Update: 1)  Spironolactone 25 Mg  Tabs (Spironolactone) .Marland Kitchen.. 1 By Mouth Once Daily 2)   Diovan 160 Mg  Tabs (Valsartan) .Marland Kitchen.. 1 By Mouth Once Daily 3)  Flonase 50 Mcg/act Susp (Fluticasone Propionate) .... 2 Sprays Each Nostril Once Daily  Current Medications (verified): 1)  Spironolactone 25 Mg  Tabs (Spironolactone) .Marland Kitchen.. 1 By Mouth Once Daily 2)  Diovan 160 Mg  Tabs (Valsartan) .Marland Kitchen.. 1 By Mouth Once Daily 3)  Flonase 50 Mcg/act Susp (Fluticasone Propionate) .... 2 Sprays Each Nostril Once Daily  Allergies (verified): No Known Drug Allergies  Past History:  Past Medical History: Last updated: 03/24/2007 Hypertension  Past Surgical History: Last updated: 03/24/2007 Caesarean section Tonsillectomy gum grafts  Family History: Last updated: 03/24/2007 MGM-- breast CA 72 Family History of Stroke F 1st degree relative 87 Family History of CAD Female 1st degree relative 1st MI in 6s Family History Hypertension  Social History: Last updated: 03/24/2007 Occupation: Musician for middle school Married Former Smoker Alcohol use-yes Drug use-no Regular exercise-yes  Risk Factors: Alcohol Use: <1 (05/07/2010) Caffeine Use: 1 (05/07/2010) Exercise: yes (05/07/2010)  Risk Factors: Smoking Status: quit (05/07/2010) Packs/Day: 1/2 (05/07/2010) Passive Smoke Exposure: no (05/07/2010)  Family History: Reviewed history from 03/24/2007 and no changes required. MGM-- breast CA 57 Family History of Stroke F 1st degree relative 76 Family History of CAD Female 1st degree relative 1st MI in 14s Family History Hypertension  Social History: Reviewed history from 03/24/2007 and no changes required. 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 Eyes:  Denies blurring, discharge, double vision, eye irritation, eye pain, halos, itching, light sensitivity, red  eye, vision loss-1 eye, and vision loss-both eyes; optho q2y. ENT:  Denies decreased hearing, difficulty swallowing, ear discharge, earache, hoarseness, nasal congestion, nosebleeds, postnasal drainage, ringing in ears, sinus pressure, and sore throat. CV:  Denies bluish discoloration of lips or nails, chest pain or discomfort, difficulty breathing at night, difficulty breathing while lying down, fainting, fatigue, leg cramps with exertion, lightheadness, near fainting, palpitations, shortness of breath with exertion, swelling of feet, swelling of hands, and weight gain. Resp:  Denies chest discomfort, chest pain with inspiration, cough, coughing up blood, excessive snoring, hypersomnolence, morning headaches, pleuritic, shortness of breath, sputum productive, and wheezing. GI:  Denies abdominal pain, bloody stools, change in bowel habits, constipation, dark tarry stools, diarrhea, excessive appetite, gas, hemorrhoids, indigestion, loss of appetite, nausea, vomiting, vomiting blood, and yellowish skin color. GU:  Denies abnormal vaginal bleeding, decreased libido, discharge, dysuria, genital sores, hematuria, incontinence, nocturia, urinary frequency, and urinary hesitancy. MS:  Denies joint pain, joint redness, joint swelling, loss of strength, low back pain, mid back pain, muscle aches, muscle , cramps, muscle weakness, stiffness, and thoracic pain. Derm:  Denies changes in color of skin, changes in nail beds, dryness, excessive perspiration, flushing, hair loss, insect bite(s), itching, lesion(s), poor wound healing, and rash. Neuro:  Denies brief paralysis, difficulty with concentration, disturbances in coordination, falling down, headaches, inability to speak, memory loss, numbness, poor balance, seizures, sensation of room spinning, tingling, tremors, visual disturbances, and weakness. Psych:  Denies alternate hallucination ( auditory/visual), anxiety, depression, easily angered, easily tearful,  irritability, mental problems, panic attacks, sense of great danger, suicidal thoughts/plans, thoughts of violence, unusual visions or sounds, and thoughts /plans of harming others. Endo:  Denies cold intolerance, excessive hunger, excessive thirst, excessive urination, heat intolerance, polyuria, and weight change. Heme:  Denies abnormal bruising, bleeding, enlarge lymph nodes, fevers, pallor, and skin discoloration. Allergy:  Denies hives or rash, itching eyes, persistent infections, seasonal allergies, and sneezing.  Physical Exam  General:  Well-developed,well-nourished,in no acute distress; alert,appropriate and cooperative throughout examination Head:  Normocephalic and atraumatic without obvious abnormalities. No apparent alopecia or balding. Eyes:  pupils equal, pupils round, pupils reactive to light, and no injection.   Ears:  External ear exam shows no significant lesions or deformities.  Otoscopic examination reveals clear canals, tympanic membranes are intact bilaterally without bulging, retraction, inflammation or discharge. Hearing is grossly normal bilaterally. Nose:  External nasal examination shows no deformity or inflammation. Nasal mucosa are pink and moist without lesions or exudates. Mouth:  Oral mucosa and oropharynx without lesions or exudates.  Teeth in good repair. Neck:  No deformities, masses, or tenderness noted. Chest Wall:  No deformities, masses, or tenderness noted. Breasts:  No mass, nodules, thickening, tenderness, bulging, retraction, inflamation, nipple discharge or skin changes noted.   Lungs:  Normal respiratory effort, chest expands symmetrically. Lungs are clear to auscultation, no crackles or  wheezes. Heart:  normal rate and no murmur.   Abdomen:  Bowel sounds positive,abdomen soft and non-tender without masses, organomegaly or hernias noted. Rectal:  No external abnormalities noted. Normal sphincter tone. No rectal masses or tenderness. Genitalia:  Pelvic  Exam:        External: normal female genitalia without lesions or masses        Vagina: normal without lesions or masses        Cervix: normal without lesions or masses        Adnexa: normal bimanual exam without masses or fullness        Uterus: normal by palpation        Pap smear: performed Msk:  No deformity or scoliosis noted of thoracic or lumbar spine.   Pulses:  R and L carotid,radial,femoral,dorsalis pedis and posterior tibial pulses are full and equal bilaterally Extremities:  No clubbing, cyanosis, edema, or deformity noted with normal full range of motion of all joints.   Neurologic:  No cranial nerve deficits noted. Station and gait are normal. Plantar reflexes are down-going bilaterally. DTRs are symmetrical throughout. Sensory, motor and coordinative functions appear intact. Skin:  Intact without suspicious lesions or rashes Cervical Nodes:  No lymphadenopathy noted Axillary Nodes:  No palpable lymphadenopathy Psych:  Cognition and judgment appear intact. Alert and cooperative with normal attention span and concentration. No apparent delusions, illusions, hallucinations   Impression & Recommendations:  Problem # 1:  PREVENTIVE HEALTH CARE (ICD-V70.0) ghm utd  Orders: Venipuncture (52841) TLB-Lipid Panel (80061-LIPID) TLB-BMP (Basic Metabolic Panel-BMET) (80048-METABOL) TLB-CBC Platelet - w/Differential (85025-CBCD) TLB-Hepatic/Liver Function Pnl (80076-HEPATIC) TLB-TSH (Thyroid Stimulating Hormone) (84443-TSH) Specimen Handling (32440) Specimen Handling (10272) EKG w/ Interpretation (93000)  Problem # 2:  MOLE (ICD-216.9)  Orders: Dermatology Referral (Derma) EKG w/ Interpretation (93000)  Problem # 3:  HYPERTENSION (ICD-401.9)  Her updated medication list for this problem includes:    Spironolactone 25 Mg Tabs (Spironolactone) .Marland Kitchen... 1 by mouth once daily    Diovan 160 Mg Tabs (Valsartan) .Marland Kitchen... 1 by mouth once daily  Orders: Venipuncture (53664) TLB-Lipid  Panel (80061-LIPID) TLB-BMP (Basic Metabolic Panel-BMET) (80048-METABOL) TLB-CBC Platelet - w/Differential (85025-CBCD) TLB-Hepatic/Liver Function Pnl (80076-HEPATIC) TLB-TSH (Thyroid Stimulating Hormone) (84443-TSH) Specimen Handling (40347) EKG w/ Interpretation (93000)  BP today: 130/82 Prior BP: 124/72 (05/05/2009)  Labs Reviewed: K+: 3.5 (05/05/2009) Creat: : 0.6 (05/05/2009)   Chol: 177 (05/05/2009)   HDL: 64.90 (05/05/2009)   LDL: 96 (05/05/2009)   TG: 79.0 (05/05/2009)  Complete Medication List: 1)  Spironolactone 25 Mg Tabs (Spironolactone) .Marland Kitchen.. 1 by mouth once daily 2)  Diovan 160 Mg Tabs (Valsartan) .Marland Kitchen.. 1 by mouth once daily 3)  Flonase 50 Mcg/act Susp (Fluticasone propionate) .... 2 sprays each nostril once daily  Patient Instructions: 1)  Please schedule a follow-up appointment in 6 months .  Prescriptions: DIOVAN 160 MG  TABS (VALSARTAN) 1 by mouth once daily  #90 x 3   Entered and Authorized by:   Loreen Freud DO   Signed by:   Loreen Freud DO on 05/07/2010   Method used:   Print then Give to Patient   RxID:   [id number] FLONASE 50 MCG/ACT SUSP (FLUTICASONE PROPIONATE) 2 sprays each nostril once daily  #1 x 11   Entered and Authorized by:   Loreen Freud DO   Signed by:   Loreen Freud DO on 05/07/2010   Method used:   Electronically to        [id number] (retail)  [address]       [place], [place]  [address]       Ph: [phone] or [phone]       Fax: [phone]   RxID:   [id number] SPIRONOLACTONE 25 MG  TABS (SPIRONOLACTONE) 1 by mouth once daily  #30 Tablet x 11   Entered and Authorized by:   Loreen Freud DO   Signed by:   Loreen Freud DO on 05/07/2010   Method used:   Electronically to        [id number]* (retail)       [address]       [place], [place]  [address]       Ph: [phone] or [phone]       Fax: [phone]   RxID:    [id number]    Orders Added: 1)  Venipuncture [57322] 2)  TLB-Lipid Panel [80061-LIPID] 3)  TLB-BMP (Basic Metabolic Panel-BMET) [80048-METABOL] 4)  TLB-CBC Platelet - w/Differential [85025-CBCD] 5)  TLB-Hepatic/Liver Function Pnl [80076-HEPATIC] 6)  TLB-TSH (Thyroid Stimulating Hormone) [84443-TSH] 7)  Specimen Handling [99000] 8)  Specimen Handling [99000] 9)  Dermatology Referral [Derma] 10)  Est. Patient 40-64 years [99396] 11)  EKG w/ Interpretation [93000]    Last Flu Vaccine:  given (01/01/2009 9:34:18 AM) Flu Vaccine Result Date:  12/31/2009 Flu Vaccine Result:  given Flu Vaccine Next Due:  1 yr Last Mammogram:  normal (10/09/2008 9:34:48 AM) Mammogram Result Date:  11/19/2009 Mammogram Result:  normal Mammogram Next Due:  1 yr   Laboratory Results   Urine Tests   Date/Time Reported: May 07, 2010 2:28 PM   Routine Urinalysis   Color: lt. yellow Appearance: Clear Glucose: negative   (Normal Range: Negative) Bilirubin: negative   (Normal Range: Negative) Ketone: negative   (Normal Range: Negative) Spec. Gravity: <1.005   (Normal Range: 1.003-1.035) Blood: negative   (Normal Range: Negative) pH: 6.0   (Normal Range: 5.0-8.0) Protein: negative   (Normal Range: Negative) Urobilinogen: negative   (Normal Range: 0-1) Nitrite: negative   (Normal Range: Negative) Leukocyte Esterace: negative   (Normal Range: Negative)    Comments: Floydene Flock  May 07, 2010 2:29 PM

## 2010-05-24 DIAGNOSIS — Z8669 Personal history of other diseases of the nervous system and sense organs: Secondary | ICD-10-CM

## 2010-05-24 HISTORY — DX: Personal history of other diseases of the nervous system and sense organs: Z86.69

## 2010-07-10 NOTE — Assessment & Plan Note (Signed)
[facility]                            CARDIOLOGY OFFICE NOTE   ADIBA, FARGNOLI                        MRN:          [id number]  DATE:03/23/2006                            DOB:          1955/07/07    REFERRING PHYSICIAN:  Loreen Freud, M.D.   INTERVAL HISTORY:  Ms. Lastra is a delightful 55 year old woman whom I saw  back in October for apparently new right bundle branch block and right-  sided chest pain, which was fairly atypical.  She has a history of  spontaneous right pneumothorax and was status post stapling of blebs.  Also had a history of hypertension.  Since we saw her, she underwent an  echocardiogram that showed an EF of 65%.  No valvular abnormalities.  She also underwent a Myoview, which showed an EF of 80%.  There was good  exercise capacity and a normal perfusion.   She returns today for routine followup.  She has been exercising 3 or 4  times a week with Jazzercise with no chest pain or shortness of breath.  She has no complaints.  She is doing well.  Her blood pressure has been  well controlled since switching her Diovan to 160 a day.  We did start  her on potassium for some hypokalemia.   CURRENT MEDICATIONS:  1. Spironolactone 25 a day.  2. Diovan 160 a day.  3. Potassium.   PHYSICAL EXAM:  She is vigorous-appearing, ambulates around the clinic  without any respiratory difficulty.  Blood pressure is 104/72, heart rate 61, weight 122.  HEENT:  Sclerae anicteric, EOMI.  There is no xanthelasma.  Mucous  membranes are moist.  NECK:  Supple.  No JVD.  Carotids are 2+ bilaterally without bruits.  There is no lymphadenopathy or thyromegaly.  CARDIAC:  Regular rate and rhythm.  No murmurs, rubs, or gallops.  LUNGS:  Clear.  ABDOMEN:  Soft, nontender, nondistended.  No hepatosplenomegaly.  No  bruits.  No masses.  Good bowel sounds.  EXTREMITIES:  Warm with no cyanosis, clubbing, or edema.  There is no  rash.  NEURO:  She is  alert and oriented x3.  Cranial nerves 2-12 are intact.  Moves all 4 extremities without difficulty.  Affect is pleasant.   EKG:  Sinus rhythm with a right bundle branch block at a rate of 61.  There is no ST-T wave abnormality.   ASSESSMENT AND PLAN:  1. Chest pain.  This is resolved.  Her Myoview is negative.  I do not      think she has significant underlying coronary artery disease.  We      will continue to follow her on a p.r.n. basis.  2. Hypertension, well controlled.  She will follow up with Dr. Laury Axon.   DISPOSITION:  She can follow up on a p.r.n. basis for any problems.     Bevelyn Buckles. Bensimhon, MD  Electronically Signed    DRB/MedQ  DD: 03/23/2006  DT: 03/23/2006  Job #: [id number]   cc:   Loreen Freud, M.D.

## 2010-07-10 NOTE — Assessment & Plan Note (Signed)
[facility]                              CARDIOLOGY OFFICE NOTE   [name], [name]                        MRN:          [id number]  DATE:12/13/2005                            DOB:          1956/01/30    REASON FOR CONSULTATION:  New right bundle-branch block.   HISTORY OF PRESENT ILLNESS:  [name] is a delightful 55 year old woman  with a history of hypertension as well as a spontaneous right pneumothorax  in 2005 status post stapling of blebs who presents today for further  evaluation of a new right bundle-branch block.  She denies any history of  known cardiovascular disease.  She has never had a stress test or a cardiac  catheterization.  As above, she did undergo stapling of blebs for a  spontaneous pneumothorax in 2005.  She currently is very active with  Jazzercise, going 3 days a week for an hour at a time without any chest pain  during exercise.  She does, however, have an occasional discomfort on her  right chest at the site of her previous thoracoscopic surgery.  This is not  related to exertion.  There is no related shortness of breath.  She does  have a kind of intermittent chronic cough.  She also has a history of high  blood pressure for which she has been followed by Dr. Laury Axon.  She denies any  syncope, presyncope, or palpitations.   REVIEW OF SYSTEMS:  Notable for allergies, chronic intermittent cough.  There are no heart failure symptoms.  She previously had a history of  glucose intolerance but straightened this out with diet and exercise.  The  remainder of review of systems is negative except for HPI and problem list.   PROBLEM LIST:  1. Hypertension.  2. History of spontaneous pneumothorax status post stapling of blebs in      November 2005.  3. History of gestational diabetes.   CURRENT MEDICATIONS:  1. Spironolactone 25 a day.  2. Diovan 80 a day.   ALLERGIES:  She has no known drug allergies.   SOCIAL HISTORY:   She is married with three children.  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  History of tobacco, quit in 1984.  No significant alcohol.   FAMILY HISTORY:  Mother died at age 73 from a stroke.  Father died at age  58, probable heart attack.  She has two half-sisters who are alive and well.   PHYSICAL EXAMINATION:  GENERAL:  She is well-appearing, in no acute  distress, looks much younger than her stated age.  Ambulates around the  clinic briskly without any respiratory difficulty.  VITAL SIGNS:  Blood pressure is 146/82, heart rate 64, weight is 122.  HEENT:  Sclerae anicteric, EOMI.  There is no xanthelasma.  Mucous membranes  are moist.  NECK:  Supple.  No JVD.  Carotids are 2+ bilaterally without any bruits.  There is no lymphadenopathy or thyromegaly.  CARDIAC:  Regular rate and rhythm +S4, no murmur.  LUNGS:  Clear.  ABDOMEN:  Soft, nontender, nondistended.  No hepatosplenomegaly, no bruits,  no masses, good bowel sounds.  EXTREMITIES:  Warm with no cyanosis, clubbing or edema.  NEUROLOGIC:  She is alert and oriented x3.  Cranial nerves II-XII are  intact.  Moves all four extremities without difficulty.  Affect is very  bright.  SKIN:  No rashes.   EKG shows normal sinus rhythm with sinus arrhythmia, rate of 64 with a right  bundle-branch block which is apparently new since her previous tracing a  year ago.  Total cholesterol is reported as 164.   ASSESSMENT AND PLAN:  1. Right bundle-branch block.  I do not think this is of any significance.      I think it just reflects aging of her conduction system.  We had a long      talk about this.  2. Right-sided chest pain.  I think this is most likely related to her      previous surgery.  However, given her risk factors I do think it is      reasonable to proceed with a routine screening stress test.  We have      ordered this.  3. Hypertension.  This is suboptimally controlled.  We will increase her       Diovan to 160 mg a day and have her follow up with a BMET.  She will      also follow up with Dr. Laury Axon.   DISPOSITION:  Will see her back in several months for followup.     Bevelyn Buckles. Bensimhon, MD    DRB/MedQ  DD: 12/13/2005  DT: 12/14/2005  Job #: [id number]

## 2010-07-10 NOTE — Op Note (Signed)
[name], [name] NO.:  [id number]   MEDICAL RECORD NO.:  [id number]          PATIENT TYPE:  INP   LOCATION:  2015                         FACILITY:  [facility]   PHYSICIAN:  Evelene Croon, M.D.     DATE OF BIRTH:  [birth year]   DATE OF PROCEDURE:  12/25/2003  DATE OF DISCHARGE:                                 OPERATIVE REPORT   PREOPERATIVE DIAGNOSIS:  Recurrent spontaneous right pneumothorax.   POSTOPERATIVE DIAGNOSIS:  Recurrent spontaneous right pneumothorax.   OPERATION PERFORMED:  Right video assisted thoracoscopy, stapling of apical  blebs, mechanical pleurodesis, wedge biopsy of right lower lobe nodule.   SURGEON:  Evelene Croon, M.D.   ASSISTANT:  Pecola Leisure, PA   ANESTHESIA:  General endotracheal.   INDICATIONS FOR PROCEDURE:  The patient is a 55 year old woman with a  history of spontaneous right pneumothorax treated with a chest tube a couple  of weeks ago.  She returned yesterday with recurrent spontaneous right  pneumothorax.  Since this was a second episode, I felt the best course of  action would be to proceed with video assisted thoracoscopy for stapling of  the blebs involved and a mechanical pleurodesis.  I discussed the operative  procedure with the patient and her husband including alternatives, benefits  and risks including bleeding, infection, injury of the lung, recurrent blebs  and spontaneous pneumothorax, and they understood and agreed to proceed.   DESCRIPTION OF PROCEDURE:  The patient was taken to the operating room and  placed on the table in the supine position.  After induction of general  endotracheal anesthesia using a double lumen tube, the patient was  positioned with the left side down in left lateral decubitus position.  The  right chest was prepped with Betadine soap and solution and draped in the  usual sterile manner.  A time out was taken and the correct operative side  was confirmed with the operating room  staff.  Preoperative intravenous  antibiotics were given.  Venous compression stockings were on both legs.   Then a 1 cm incision was made in the midaxillary line over the eighth  intercostal space.  A trocar was then inserted between the ribs and the 30  degree thoracoscope was inserted.  Examination of the pleural space showed  no abnormality.  Examination of the lungs showed several small blebs at the  apex of the right lung.  I also examined the superior segment of the right  lower lobe and there were no blebs present there.  There was a small nodule  present on the edge of the right lower lobe which looked like it may be an  intrapulmonary lymph node.  I decided to do a wedge biopsy of this lesion  using a stapler and this specimen was sent to pathology for permanent  section.  In addition to the midaxillary incision, two other 1 cm incisions  were made for inserting the stapler and the grasping instruments.  One was  positioned in the anterior axillary line over the fourth intercostal space  and one in the posterior axillary  line over the fifth intercostal space.  Using a grasping instrument and the linear stapler, the apical blebs were  stapled without difficulty.  There was complete hemostasis.  No other  abnormalities were seen in the lung.  The lung was then reinflated under  direct vision and no visible air leak was seen.  A 28 French chest tube was  inserted through the midaxillary incision and positioned with its tip near  the apex of the chest.  The two remaining incisions were closed using 2-0  Vicryl interrupted suture to reapproximate the muscle and subcutaneous  tissue and  3-0 subcuticular skin closure.  The sponge, needle and instrument counts  were correct according to the scrub nurse.  Dry sterile dressings were  applied over the incisions.  The chest tube was connected to Pleur-evac  suction.  The patient was awakened, extubated and transported to the post   anesthesia care unit in satisfactory and stable condition.      [name]   BB/MEDQ  D:  12/25/2003  T:  12/25/2003  Job:  [id number]

## 2010-07-10 NOTE — Discharge Summary (Signed)
NAMEONELIA, CADMUS NO.:  [id number]   MEDICAL RECORD NO.:  [id number]          PATIENT TYPE:  INP   LOCATION:  2010                         FACILITY:  [facility]   PHYSICIAN:  Jerold Coombe, P.A.DATE OF BIRTH:  Feb 10, 1956   DATE OF ADMISSION:  12/16/2003  DATE OF DISCHARGE:                                 DISCHARGE SUMMARY   ANTICIPATED DATE OF DISCHARGE:  December 20, 2003   ADMISSION DIAGNOSIS:  Spontaneous right pneumothorax.   DISCHARGE/SECONDARY DIAGNOSES:  1.  Spontaneous right pneumothorax.  2.  Hypertension.  3.  History of migraine headaches.  4.  Gestational diabetes.  5.  History of smoking but quit in 1984.   PROCEDURES:  Insertion of a 20-French chest tube on the right by Dr. Evelene Croon on December 16, 2003.   BRIEF HISTORY:  Ms. Debroux is a 55 year old white female with history of  hypertension and remote smoking who developed pain in the center of her  chest and mild shortness of breath on December 14, 2003.  This persisted for  approximately 2 days before she presented to Urgent Care.  A chest x-ray was  done showing a large right pneumothorax.  She was then sent to the [facility] Emergency Department to be evaluated by Dr. Evelene Croon of  [facility].  Based on the finding, he inserted a right chest tube and she was  admitted.  She had no prior history of pneumothorax.   HOSPITAL COURSE:  Ms. Huard was admitted to [facility] on December 16, 2003 after undergoing placement of a right chest tube for spontaneous  right pneumothorax.  Post chest tube placement her test tube was placed on  20 cm of suction.  On October 26th her chest x-rays had remained stable,  there was no air leak and her chest tube was placed to water-seal.  The  following day her chest x-ray again showed no pneumothorax.  On exam she had  no air leak.  She was afebrile with stable vital signs.  She was saturating  98% on room air.  On exam her lungs were  clear, her heart had a regular rate  and rhythm, her extremities showed no edema.  Since her lungs had stayed  expanded on water-seal it was felt it was appropriate to discontinue her  chest tube.  Her follow up portable chest x-ray showed no pneumothorax.  It  was felt that it her PA and lateral chest x-ray on the morning of December 20, 2003 was stable that she would be discharged home.   DISCHARGE MEDICATIONS:  1.  Spironolactone 25 mg one p.o. daily.  2.  Diovan 80 mg one p.o. daily.  3.  Nasonex two sprays per nostril daily as needed.  4.  Tylox one to two tablets q.4 h. p.r.n. pain.   DISCHARGE INSTRUCTIONS:  She is to avoid driving, heavy lifting, or  strenuous activity until her follow up appointment.  She may resume her  prehospitalization diet.  She may shower daily with soap and water.  She is  to notify the [facility] office if she develop fever greater than 101, shortness  of breath, or redness or drainage from her incision site.   FOLLOW UP:  She is to follow up with Dr. Charlett Lango at the [facility]  office on December 31, 2003 at 1:45 p.m.  This was scheduled with Dr.  Dorris Fetch as Dr. Laneta Simmers will be practicing in [facility].  She is to have  a chest x-ray at the [facility] on December 31, 2003 at  12:45 p.m. and was instructed to bring her chest x-ray films with her to the  [facility] office.       AWZ/MEDQ  D:  12/19/2003  T:  12/19/2003  Job:  [id number]   cc:   Angelena Sole, M.D. [facility]

## 2010-11-09 ENCOUNTER — Ambulatory Visit: Payer: BC Managed Care – PPO | Admitting: Family Medicine

## 2011-04-28 ENCOUNTER — Encounter: Payer: Self-pay | Admitting: Family Medicine

## 2011-05-10 ENCOUNTER — Encounter: Payer: BC Managed Care – PPO | Admitting: Family Medicine

## 2011-05-10 ENCOUNTER — Other Ambulatory Visit: Payer: Self-pay | Admitting: Family Medicine

## 2011-05-22 ENCOUNTER — Ambulatory Visit (INDEPENDENT_AMBULATORY_CARE_PROVIDER_SITE_OTHER): Payer: BC Managed Care – PPO | Admitting: Family Medicine

## 2011-05-22 ENCOUNTER — Ambulatory Visit: Payer: BC Managed Care – PPO

## 2011-05-22 ENCOUNTER — Telehealth: Payer: Self-pay

## 2011-05-22 VITALS — BP 144/88 | HR 57 | Temp 98.0°F | Resp 16 | Ht 63.0 in | Wt 124.6 lb

## 2011-05-22 DIAGNOSIS — Z8709 Personal history of other diseases of the respiratory system: Secondary | ICD-10-CM

## 2011-05-22 DIAGNOSIS — R0789 Other chest pain: Secondary | ICD-10-CM

## 2011-05-22 DIAGNOSIS — S270XXA Traumatic pneumothorax, initial encounter: Secondary | ICD-10-CM

## 2011-05-22 DIAGNOSIS — R071 Chest pain on breathing: Secondary | ICD-10-CM

## 2011-05-22 DIAGNOSIS — I10 Essential (primary) hypertension: Secondary | ICD-10-CM

## 2011-05-22 DIAGNOSIS — J309 Allergic rhinitis, unspecified: Secondary | ICD-10-CM

## 2011-05-22 MED ORDER — CETIRIZINE HCL 10 MG PO TABS: 10.0000 mg | ORAL_TABLET | Freq: Every day | ORAL | Status: DC

## 2011-05-22 MED ORDER — PREDNISONE 10 MG PO TABS: 10.0000 mg | ORAL_TABLET | Freq: Every day | ORAL | Status: DC

## 2011-05-22 NOTE — Progress Notes (Signed)
  Subjective:    Patient ID: [name], female    DOB: 04-Aug-1955, 56 y.o.   MRN: [id number]  HPI  Patient presents with history of environmental allergies.  Recently with increase symptoms of facial congestion, post nasal drainage and cough.(Has been on allergy immunotherapy in the past) After coughing developed (R) sided chest wall pain. Patient became very alarmed as in 2005 patient developed spontaneous pneumothorax that ultimately required lung to be "tacked down"(Dr. Laneta Simmers) Denies SOB/DOE  Also has been exercising and wonders if she could have pulled a muscle.  Review of Systems     Objective:   Physical Exam  Constitutional: She appears well-developed.  HENT:  Right Ear: Tympanic membrane is retracted.  Left Ear: Tympanic membrane is retracted.  Nose: Mucosal edema present.  Mouth/Throat: Posterior oropharyngeal erythema: post nasal drainage.  Neck: Neck supple.  Cardiovascular: Normal rate, regular rhythm and normal heart sounds.   Pulmonary/Chest: Effort normal and breath sounds normal.  Neurological: She is alert.  Skin: Skin is warm.    [facility] reading (PRIMARY) by  Dr. Hal Hope No pneumothorax       Assessment & Plan:   1. Chest wall pain  DG Chest 2 View  2. History of pneumothorax    3. Allergic rhinitis  predniSONE (DELTASONE) 10 MG tablet, cetirizine (ZYRTEC) 10 MG tablet  4. HTN (hypertension)     Patient reassured she does not have a pneumothorax She will keep her follow up with Dr. Laury Axon in 5/13

## 2011-06-10 NOTE — Telephone Encounter (Signed)
test

## 2011-07-09 ENCOUNTER — Other Ambulatory Visit: Payer: Self-pay | Admitting: Family Medicine

## 2011-07-09 NOTE — Telephone Encounter (Signed)
Last seen 05/07/10 and filled 05/05/11 #30 with 1 refill. Pending apt 07/14/11. Please advise     KP

## 2011-07-11 ENCOUNTER — Ambulatory Visit (INDEPENDENT_AMBULATORY_CARE_PROVIDER_SITE_OTHER): Payer: BC Managed Care – PPO | Admitting: Physician Assistant

## 2011-07-11 VITALS — BP 148/78 | HR 91 | Temp 98.5°F | Resp 18 | Ht 63.0 in | Wt 126.0 lb

## 2011-07-11 DIAGNOSIS — J309 Allergic rhinitis, unspecified: Secondary | ICD-10-CM

## 2011-07-11 DIAGNOSIS — R05 Cough: Secondary | ICD-10-CM

## 2011-07-11 DIAGNOSIS — J310 Chronic rhinitis: Secondary | ICD-10-CM

## 2011-07-11 DIAGNOSIS — R059 Cough, unspecified: Secondary | ICD-10-CM

## 2011-07-11 DIAGNOSIS — J329 Chronic sinusitis, unspecified: Secondary | ICD-10-CM

## 2011-07-11 MED ORDER — IPRATROPIUM BROMIDE 0.03 % NA SOLN: 2.0000 | Freq: Two times a day (BID) | NASAL | Status: DC

## 2011-07-11 MED ORDER — GUAIFENESIN ER 1200 MG PO TB12: 1.0000 | ORAL_TABLET | Freq: Two times a day (BID) | ORAL | Status: DC | PRN

## 2011-07-11 MED ORDER — BENZONATATE 100 MG PO CAPS: 100.0000 mg | ORAL_CAPSULE | Freq: Three times a day (TID) | ORAL | Status: AC | PRN

## 2011-07-11 NOTE — Patient Instructions (Signed)
Get LOTS of rest and drink at least 64 ounces of water daily.  If you do not begin to improve by day 6-8 of your illness, call.

## 2011-07-11 NOTE — Progress Notes (Signed)
  Subjective:    Patient ID: [name], female    DOB: Apr 22, 1955, 56 y.o.   MRN: [id number]  HPI  Presents with 2-3 days of sinus pressure (left cheek/eye area), post-nasal drainage, and cough.  Fever 100.3 last night.  No GU/GI symptoms.  Has chronic allergies treated with Flonase.  Review of Systems As above.    Objective:   Physical Exam Vital signs noted. Well-developed, well nourished WF who is awake, alert and oriented, in NAD. HEENT: /AT, PERRL, EOMI.  Sclera and conjunctiva are clear.  EAC are patent, TMs are normal in appearance. Nasal mucosa is pink and moist. OP is clear. Face is non-tender on palpation. Neck: supple, non-tender, no lymphadenopathy, thyromegaly. Heart: RRR, no murmur Lungs: CTA Extremities: no cyanosis, clubbing or edema. Skin: warm and dry without rash.        Assessment & Plan:   1. Rhinitis  ipratropium (ATROVENT) 0.03 % nasal spray  2. Cough  Guaifenesin (MUCINEX MAXIMUM STRENGTH) 1200 MG TB12, benzonatate (TESSALON) 100 MG capsule  3. AR (allergic rhinitis)  ipratropium (ATROVENT) 0.03 % nasal spray  4. Sinusitis  ipratropium (ATROVENT) 0.03 % nasal spray, Guaifenesin (MUCINEX MAXIMUM STRENGTH) 1200 MG TB12   If her symptoms worsen or persist, she will call.  I would plan to call in an antibiotic for sinusitis (Amoxicillin 875 mg, 2 PO BID x 5 days, #20)

## 2011-07-14 ENCOUNTER — Encounter: Payer: Self-pay | Admitting: Family Medicine

## 2011-07-14 ENCOUNTER — Other Ambulatory Visit (INDEPENDENT_AMBULATORY_CARE_PROVIDER_SITE_OTHER): Payer: BC Managed Care – PPO

## 2011-07-14 ENCOUNTER — Other Ambulatory Visit (HOSPITAL_COMMUNITY)
Admission: RE | Admit: 2011-07-14 | Discharge: 2011-07-14 | Disposition: A | Discharge status: Home / Self Care | Payer: BC Managed Care – PPO | Source: Ambulatory Visit | Attending: Family Medicine | Admitting: Family Medicine

## 2011-07-14 ENCOUNTER — Ambulatory Visit (INDEPENDENT_AMBULATORY_CARE_PROVIDER_SITE_OTHER): Payer: BC Managed Care – PPO | Admitting: Family Medicine

## 2011-07-14 VITALS — BP 120/72 | HR 83 | Temp 99.5°F | Ht 63.5 in | Wt 127.4 lb

## 2011-07-14 DIAGNOSIS — Z01419 Encounter for gynecological examination (general) (routine) without abnormal findings: Secondary | ICD-10-CM | POA: Insufficient documentation

## 2011-07-14 DIAGNOSIS — J302 Other seasonal allergic rhinitis: Secondary | ICD-10-CM

## 2011-07-14 DIAGNOSIS — Z0000 Encounter for general adult medical examination without abnormal findings: Secondary | ICD-10-CM

## 2011-07-14 DIAGNOSIS — I10 Essential (primary) hypertension: Secondary | ICD-10-CM

## 2011-07-14 DIAGNOSIS — N912 Amenorrhea, unspecified: Secondary | ICD-10-CM

## 2011-07-14 DIAGNOSIS — Z124 Encounter for screening for malignant neoplasm of cervix: Secondary | ICD-10-CM

## 2011-07-14 DIAGNOSIS — J309 Allergic rhinitis, unspecified: Secondary | ICD-10-CM

## 2011-07-14 LAB — LIPID PANEL
HDL: 53.6 mg/dL (ref >39.00)
LDL Cholesterol: 89 mg/dL (ref 0–99)
VLDL: 9.6 mg/dL (ref 0.0–40.0)

## 2011-07-14 LAB — BASIC METABOLIC PANEL
CO2: 28 mEq/L (ref 19–32)
Chloride: 104 mEq/L (ref 96–112)
Creatinine, Ser: 0.6 mg/dL (ref 0.4–1.2)
Glucose, Bld: 106 mg/dL — ABNORMAL HIGH (ref 70–99)

## 2011-07-14 LAB — CBC WITH DIFFERENTIAL/PLATELET
Basophils Relative: 0.5 % (ref 0.0–3.0)
Eosinophils Relative: 2.1 % (ref 0.0–5.0)
HCT: 37.2 % (ref 36.0–46.0)
Hemoglobin: 12.5 g/dL (ref 12.0–15.0)
Lymphs Abs: 1.3 10*3/uL (ref 0.7–4.0)
MCV: 89.6 fl (ref 78.0–100.0)
Monocytes Absolute: 0.5 10*3/uL (ref 0.1–1.0)
Monocytes Relative: 7.2 % (ref 3.0–12.0)
Neutro Abs: 5.2 10*3/uL (ref 1.4–7.7)
WBC: 7.3 10*3/uL (ref 4.5–10.5)

## 2011-07-14 LAB — POCT URINALYSIS DIPSTICK
Bilirubin, UA: NEGATIVE
Blood, UA: NEGATIVE
Ketones, UA: NEGATIVE
Leukocytes, UA: NEGATIVE
Spec Grav, UA: <=1.005
pH, UA: 6

## 2011-07-14 LAB — HEPATIC FUNCTION PANEL
Bilirubin, Direct: 0.1 mg/dL (ref 0.0–0.3)
Total Bilirubin: 0.7 mg/dL (ref 0.3–1.2)
Total Protein: 7.4 g/dL (ref 6.0–8.3)

## 2011-07-14 MED ORDER — AZELASTINE-FLUTICASONE 137-50 MCG/ACT NA SUSP: 1.0000 | Freq: Two times a day (BID) | NASAL | Status: DC

## 2011-07-14 MED ORDER — SPIRONOLACTONE 25 MG PO TABS: 25.0000 mg | ORAL_TABLET | Freq: Every day | ORAL | Status: DC

## 2011-07-14 MED ORDER — VALSARTAN 160 MG PO TABS: 160.0000 mg | ORAL_TABLET | Freq: Every day | ORAL | Status: DC

## 2011-07-14 NOTE — Progress Notes (Signed)
LABS ONLY  

## 2011-07-14 NOTE — Progress Notes (Signed)
Subjective:     [name] is a 56 y.o. female and is here for a comprehensive physical exam. The patient reports no problems.  History   Social History  . Marital Status: Married    Spouse Name: N/A    Number of Children: N/A  . Years of Education: N/A   Occupational History  . [employer]   Social History Main Topics  . Smoking status: Former Smoker -- 1.0 packs/day for 14 years    Types: Cigarettes    Quit date: 07/11/1982  . Smokeless tobacco: Not on file  . Alcohol Use: Yes     rare  . Drug Use: No  . Sexually Active: Yes -- Female partner(s)   Other Topics Concern  . Not on file   Social History Narrative   Exercise-- [employer]  3x a week    Health Maintenance  Topic Date Due  . Influenza Vaccine  11/23/2011  . Mammogram  04/08/2013  . Pap Smear  07/14/2014  . Colonoscopy  06/06/2017  . Tetanus/tdap  04/18/2018    The following portions of the patient's history were reviewed and updated as appropriate: allergies, current medications, past family history, past medical history, past social history, past surgical history and problem list.  Review of Systems Review of Systems  Constitutional: Negative for activity change, appetite change and fatigue.  HENT: Negative for hearing loss, congestion, tinnitus and ear discharge.  dentist q18m Eyes: Negative for visual disturbance (see optho q1y -- vision corrected to 20/20 with glasses).  Respiratory: Negative for cough, chest tightness and shortness of breath.   Cardiovascular: Negative for chest pain, palpitations and leg swelling.  Gastrointestinal: Negative for abdominal pain, diarrhea, constipation and abdominal distention.  Genitourinary: Negative for urgency, frequency, decreased urine volume and difficulty urinating.  Musculoskeletal: Negative for back pain, arthralgias and gait problem.  Skin: Negative for color change, pallor and rash.  Neurological: Negative for  dizziness, light-headedness, numbness and headaches.  Hematological: Negative for adenopathy. Does not bruise/bleed easily.  Psychiatric/Behavioral: Negative for suicidal ideas, confusion, sleep disturbance, self-injury, dysphoric mood, decreased concentration and agitation.       Objective:    BP 120/72  Pulse 83  Temp(Src) 99.5 F (37.5 C) (Oral)  Ht 5' 3.5" (1.613 m)  Wt 127 lb 6.4 oz (57.788 kg)  BMI 22.21 kg/m2  SpO2 97% General appearance: alert, cooperative, appears stated age and no distress Head: Normocephalic, without obvious abnormality, atraumatic Eyes: conjunctivae/corneas clear. PERRL, EOM's intact. Fundi benign. Ears: normal TM's and external ear canals both ears Nose: Nares normal. Septum midline. Mucosa normal. No drainage or sinus tenderness. Throat: lips, mucosa, and tongue normal; teeth and gums normal Neck: no adenopathy, no carotid bruit, no JVD, supple, symmetrical, trachea midline and thyroid not enlarged, symmetric, no tenderness/mass/nodules Back: symmetric, no curvature. ROM normal. No CVA tenderness. Lungs: clear to auscultation bilaterally Breasts: normal appearance, no masses or tenderness Heart: regular rate and rhythm, S1, S2 normal, no murmur, click, rub or gallop Abdomen: soft, non-tender; bowel sounds normal; no masses,  no organomegaly Pelvic: cervix normal in appearance, external genitalia normal, no adnexal masses or tenderness, no cervical motion tenderness, rectovaginal septum normal, uterus normal size, shape, and consistency and vagina normal without discharge Extremities: extremities normal, atraumatic, no cyanosis or edema Pulses: 2+ and symmetric Skin: Skin color, texture, turgor normal. No rashes or lesions Lymph nodes: Cervical, supraclavicular, and axillary nodes normal. Neurologic: Alert and oriented X 3, normal strength and tone. Normal  symmetric reflexes. Normal coordination and gait psych-- no depression / anxiety      Assessment:    Healthy female exam.      Plan:    labs done this am [facility] utd  See After Visit Summary for Counseling Recommendations

## 2011-07-14 NOTE — Patient Instructions (Signed)
Preventive Care for Adults, Female A healthy lifestyle and preventive care can promote health and wellness. Preventive health guidelines for women include the following key practices.  A routine yearly physical is a good way to check with your caregiver about your health and preventive screening. It is a chance to share any concerns and updates on your health, and to receive a thorough exam.   Visit your dentist for a routine exam and preventive care every 6 months. Brush your teeth twice a day and floss once a day. Good oral hygiene prevents tooth decay and gum disease.   The frequency of eye exams is based on your age, health, family medical history, use of contact lenses, and other factors. Follow your caregiver's recommendations for frequency of eye exams.   Eat a healthy diet. Foods like vegetables, fruits, whole grains, low-fat dairy products, and lean protein foods contain the nutrients you need without too many calories. Decrease your intake of foods high in solid fats, added sugars, and salt. Eat the right amount of calories for you.Get information about a proper diet from your caregiver, if necessary.   Regular physical exercise is one of the most important things you can do for your health. Most adults should get at least 150 minutes of moderate-intensity exercise (any activity that increases your heart rate and causes you to sweat) each week. In addition, most adults need muscle-strengthening exercises on 2 or more days a week.   Maintain a healthy weight. The body mass index (BMI) is a screening tool to identify possible weight problems. It provides an estimate of body fat based on height and weight. Your caregiver can help determine your BMI, and can help you achieve or maintain a healthy weight.For adults 20 years and older:   A BMI below 18.5 is considered underweight.   A BMI of 18.5 to 24.9 is normal.   A BMI of 25 to 29.9 is considered overweight.   A BMI of 30 and above is  considered obese.   Maintain normal blood lipids and cholesterol levels by exercising and minimizing your intake of saturated fat. Eat a balanced diet with plenty of fruit and vegetables. Blood tests for lipids and cholesterol should begin at age 20 and be repeated every 5 years. If your lipid or cholesterol levels are high, you are over 50, or you are at high risk for heart disease, you may need your cholesterol levels checked more frequently.Ongoing high lipid and cholesterol levels should be treated with medicines if diet and exercise are not effective.   If you smoke, find out from your caregiver how to quit. If you do not use tobacco, do not start.   If you are pregnant, do not drink alcohol. If you are breastfeeding, be very cautious about drinking alcohol. If you are not pregnant and choose to drink alcohol, do not exceed 1 drink per day. One drink is considered to be 12 ounces (355 mL) of beer, 5 ounces (148 mL) of wine, or 1.5 ounces (44 mL) of liquor.   Avoid use of street drugs. Do not share needles with anyone. Ask for help if you need support or instructions about stopping the use of drugs.   High blood pressure causes heart disease and increases the risk of stroke. Your blood pressure should be checked at least every 1 to 2 years. Ongoing high blood pressure should be treated with medicines if weight loss and exercise are not effective.   If you are 55 to 58   years old, ask your caregiver if you should take aspirin to prevent strokes.   Diabetes screening involves taking a blood sample to check your fasting blood sugar level. This should be done once every 3 years, after age 45, if you are within normal weight and without risk factors for diabetes. Testing should be considered at a younger age or be carried out more frequently if you are overweight and have at least 1 risk factor for diabetes.   Breast cancer screening is essential preventive care for women. You should practice "breast  self-awareness." This means understanding the normal appearance and feel of your breasts and may include breast self-examination. Any changes detected, no matter how small, should be reported to a caregiver. Women in their 20s and 30s should have a clinical breast exam (CBE) by a caregiver as part of a regular health exam every 1 to 3 years. After age 40, women should have a CBE every year. Starting at age 40, women should consider having a mammography (breast X-ray test) every year. Women who have a family history of breast cancer should talk to their caregiver about genetic screening. Women at a high risk of breast cancer should talk to their caregivers about having magnetic resonance imaging (MRI) and a mammography every year.   The Pap test is a screening test for cervical cancer. A Pap test can show cell changes on the cervix that might become cervical cancer if left untreated. A Pap test is a procedure in which cells are obtained and examined from the lower end of the uterus (cervix).   Women should have a Pap test starting at age 21.   Between ages 21 and 29, Pap tests should be repeated every 2 years.   Beginning at age 30, you should have a Pap test every 3 years as long as the past 3 Pap tests have been normal.   Some women have medical problems that increase the chance of getting cervical cancer. Talk to your caregiver about these problems. It is especially important to talk to your caregiver if a new problem develops soon after your last Pap test. In these cases, your caregiver may recommend more frequent screening and Pap tests.   The above recommendations are the same for women who have or have not gotten the vaccine for human papillomavirus (HPV).   If you had a hysterectomy for a problem that was not cancer or a condition that could lead to cancer, then you no longer need Pap tests. Even if you no longer need a Pap test, a regular exam is a good idea to make sure no other problems are  starting.   If you are between ages 65 and 70, and you have had normal Pap tests going back 10 years, you no longer need Pap tests. Even if you no longer need a Pap test, a regular exam is a good idea to make sure no other problems are starting.   If you have had past treatment for cervical cancer or a condition that could lead to cancer, you need Pap tests and screening for cancer for at least 20 years after your treatment.   If Pap tests have been discontinued, risk factors (such as a new sexual partner) need to be reassessed to determine if screening should be resumed.   The HPV test is an additional test that may be used for cervical cancer screening. The HPV test looks for the virus that can cause the cell changes on the cervix.   The cells collected during the Pap test can be tested for HPV. The HPV test could be used to screen women aged 30 years and older, and should be used in women of any age who have unclear Pap test results. After the age of 30, women should have HPV testing at the same frequency as a Pap test.   Colorectal cancer can be detected and often prevented. Most routine colorectal cancer screening begins at the age of 50 and continues through age 75. However, your caregiver may recommend screening at an earlier age if you have risk factors for colon cancer. On a yearly basis, your caregiver may provide home test kits to check for hidden blood in the stool. Use of a small camera at the end of a tube, to directly examine the colon (sigmoidoscopy or colonoscopy), can detect the earliest forms of colorectal cancer. Talk to your caregiver about this at age 50, when routine screening begins. Direct examination of the colon should be repeated every 5 to 10 years through age 75, unless early forms of pre-cancerous polyps or small growths are found.   Hepatitis C blood testing is recommended for all people born from 1945 through 1965 and any individual with known risks for hepatitis C.    Practice safe sex. Use condoms and avoid high-risk sexual practices to reduce the spread of sexually transmitted infections (STIs). STIs include gonorrhea, chlamydia, syphilis, trichomonas, herpes, HPV, and human immunodeficiency virus (HIV). Herpes, HIV, and HPV are viral illnesses that have no cure. They can result in disability, cancer, and death. Sexually active women aged 25 and younger should be checked for chlamydia. Older women with new or multiple partners should also be tested for chlamydia. Testing for other STIs is recommended if you are sexually active and at increased risk.   Osteoporosis is a disease in which the bones lose minerals and strength with aging. This can result in serious bone fractures. The risk of osteoporosis can be identified using a bone density scan. Women ages 65 and over and women at risk for fractures or osteoporosis should discuss screening with their caregivers. Ask your caregiver whether you should take a calcium supplement or vitamin D to reduce the rate of osteoporosis.   Menopause can be associated with physical symptoms and risks. Hormone replacement therapy is available to decrease symptoms and risks. You should talk to your caregiver about whether hormone replacement therapy is right for you.   Use sunscreen with sun protection factor (SPF) of 30 or more. Apply sunscreen liberally and repeatedly throughout the day. You should seek shade when your shadow is shorter than you. Protect yourself by wearing long sleeves, pants, a wide-brimmed hat, and sunglasses year round, whenever you are outdoors.   Once a month, do a whole body skin exam, using a mirror to look at the skin on your back. Notify your caregiver of new moles, moles that have irregular borders, moles that are larger than a pencil eraser, or moles that have changed in shape or color.   Stay current with required immunizations.   Influenza. You need a dose every fall (or winter). The composition of  the flu vaccine changes each year, so being vaccinated once is not enough.   Pneumococcal polysaccharide. You need 1 to 2 doses if you smoke cigarettes or if you have certain chronic medical conditions. You need 1 dose at age 65 (or older) if you have never been vaccinated.   Tetanus, diphtheria, pertussis (Tdap, Td). Get 1 dose of   Tdap vaccine if you are younger than age 65, are over 65 and have contact with an infant, are a healthcare worker, are pregnant, or simply want to be protected from whooping cough. After that, you need a Td booster dose every 10 years. Consult your caregiver if you have not had at least 3 tetanus and diphtheria-containing shots sometime in your life or have a deep or dirty wound.   HPV. You need this vaccine if you are a woman age 26 or younger. The vaccine is given in 3 doses over 6 months.   Measles, mumps, rubella (MMR). You need at least 1 dose of MMR if you were born in 1957 or later. You may also need a second dose.   Meningococcal. If you are age 19 to 21 and a first-year college student living in a residence hall, or have one of several medical conditions, you need to get vaccinated against meningococcal disease. You may also need additional booster doses.   Zoster (shingles). If you are age 60 or older, you should get this vaccine.   Varicella (chickenpox). If you have never had chickenpox or you were vaccinated but received only 1 dose, talk to your caregiver to find out if you need this vaccine.   Hepatitis A. You need this vaccine if you have a specific risk factor for hepatitis A virus infection or you simply wish to be protected from this disease. The vaccine is usually given as 2 doses, 6 to 18 months apart.   Hepatitis B. You need this vaccine if you have a specific risk factor for hepatitis B virus infection or you simply wish to be protected from this disease. The vaccine is given in 3 doses, usually over 6 months.  Preventive Services /  Frequency Ages 19 to 39  Blood pressure check.** / Every 1 to 2 years.   Lipid and cholesterol check.** / Every 5 years beginning at age 20.   Clinical breast exam.** / Every 3 years for women in their 20s and 30s.   Pap test.** / Every 2 years from ages 21 through 29. Every 3 years starting at age 30 through age 65 or 70 with a history of 3 consecutive normal Pap tests.   HPV screening.** / Every 3 years from ages 30 through ages 65 to 70 with a history of 3 consecutive normal Pap tests.   Hepatitis C blood test.** / For any individual with known risks for hepatitis C.   Skin self-exam. / Monthly.   Influenza immunization.** / Every year.   Pneumococcal polysaccharide immunization.** / 1 to 2 doses if you smoke cigarettes or if you have certain chronic medical conditions.   Tetanus, diphtheria, pertussis (Tdap, Td) immunization. / A one-time dose of Tdap vaccine. After that, you need a Td booster dose every 10 years.   HPV immunization. / 3 doses over 6 months, if you are 26 and younger.   Measles, mumps, rubella (MMR) immunization. / You need at least 1 dose of MMR if you were born in 1957 or later. You may also need a second dose.   Meningococcal immunization. / 1 dose if you are age 19 to 21 and a first-year college student living in a residence hall, or have one of several medical conditions, you need to get vaccinated against meningococcal disease. You may also need additional booster doses.   Varicella immunization.** / Consult your caregiver.   Hepatitis A immunization.** / Consult your caregiver. 2 doses, 6 to 18 months   apart.   Hepatitis B immunization.** / Consult your caregiver. 3 doses usually over 6 months.  Ages 40 to 64  Blood pressure check.** / Every 1 to 2 years.   Lipid and cholesterol check.** / Every 5 years beginning at age 20.   Clinical breast exam.** / Every year after age 40.   Mammogram.** / Every year beginning at age 40 and continuing for as  long as you are in good health. Consult with your caregiver.   Pap test.** / Every 3 years starting at age 30 through age 65 or 70 with a history of 3 consecutive normal Pap tests.   HPV screening.** / Every 3 years from ages 30 through ages 65 to 70 with a history of 3 consecutive normal Pap tests.   Fecal occult blood test (FOBT) of stool. / Every year beginning at age 50 and continuing until age 75. You may not need to do this test if you get a colonoscopy every 10 years.   Flexible sigmoidoscopy or colonoscopy.** / Every 5 years for a flexible sigmoidoscopy or every 10 years for a colonoscopy beginning at age 50 and continuing until age 75.   Hepatitis C blood test.** / For all people born from 1945 through 1965 and any individual with known risks for hepatitis C.   Skin self-exam. / Monthly.   Influenza immunization.** / Every year.   Pneumococcal polysaccharide immunization.** / 1 to 2 doses if you smoke cigarettes or if you have certain chronic medical conditions.   Tetanus, diphtheria, pertussis (Tdap, Td) immunization.** / A one-time dose of Tdap vaccine. After that, you need a Td booster dose every 10 years.   Measles, mumps, rubella (MMR) immunization. / You need at least 1 dose of MMR if you were born in 1957 or later. You may also need a second dose.   Varicella immunization.** / Consult your caregiver.   Meningococcal immunization.** / Consult your caregiver.   Hepatitis A immunization.** / Consult your caregiver. 2 doses, 6 to 18 months apart.   Hepatitis B immunization.** / Consult your caregiver. 3 doses, usually over 6 months.  Ages 65 and over  Blood pressure check.** / Every 1 to 2 years.   Lipid and cholesterol check.** / Every 5 years beginning at age 20.   Clinical breast exam.** / Every year after age 40.   Mammogram.** / Every year beginning at age 40 and continuing for as long as you are in good health. Consult with your caregiver.   Pap test.** /  Every 3 years starting at age 30 through age 65 or 70 with a 3 consecutive normal Pap tests. Testing can be stopped between 65 and 70 with 3 consecutive normal Pap tests and no abnormal Pap or HPV tests in the past 10 years.   HPV screening.** / Every 3 years from ages 30 through ages 65 or 70 with a history of 3 consecutive normal Pap tests. Testing can be stopped between 65 and 70 with 3 consecutive normal Pap tests and no abnormal Pap or HPV tests in the past 10 years.   Fecal occult blood test (FOBT) of stool. / Every year beginning at age 50 and continuing until age 75. You may not need to do this test if you get a colonoscopy every 10 years.   Flexible sigmoidoscopy or colonoscopy.** / Every 5 years for a flexible sigmoidoscopy or every 10 years for a colonoscopy beginning at age 50 and continuing until age 75.   Hepatitis   C blood test.** / For all people born from 1945 through 1965 and any individual with known risks for hepatitis C.   Osteoporosis screening.** / A one-time screening for women ages 65 and over and women at risk for fractures or osteoporosis.   Skin self-exam. / Monthly.   Influenza immunization.** / Every year.   Pneumococcal polysaccharide immunization.** / 1 dose at age 65 (or older) if you have never been vaccinated.   Tetanus, diphtheria, pertussis (Tdap, Td) immunization. / A one-time dose of Tdap vaccine if you are over 65 and have contact with an infant, are a healthcare worker, or simply want to be protected from whooping cough. After that, you need a Td booster dose every 10 years.   Varicella immunization.** / Consult your caregiver.   Meningococcal immunization.** / Consult your caregiver.   Hepatitis A immunization.** / Consult your caregiver. 2 doses, 6 to 18 months apart.   Hepatitis B immunization.** / Check with your caregiver. 3 doses, usually over 6 months.  ** Family history and personal history of risk and conditions may change your caregiver's  recommendations. Document Released: 04/06/2001 Document Revised: 01/28/2011 Document Reviewed: 07/06/2010 [employer] Patient Information 2012 [employer]. 

## 2011-07-15 ENCOUNTER — Encounter: Payer: Self-pay | Admitting: *Deleted

## 2011-07-16 ENCOUNTER — Telehealth: Payer: Self-pay

## 2011-07-16 NOTE — Telephone Encounter (Signed)
Pt is requesting antibotics - fever mid day every day, cough,  [facility] [address] [place]

## 2011-07-17 MED ORDER — AMOXICILLIN 875 MG PO TABS: 875.0000 mg | ORAL_TABLET | Freq: Two times a day (BID) | ORAL | Status: AC

## 2011-07-17 NOTE — Telephone Encounter (Signed)
Per chelle documentation on last OV, she stated pt would call for ABX if needed.

## 2011-07-17 NOTE — Telephone Encounter (Signed)
Spoke with pt advised RX sent to pharmacy. 

## 2011-07-17 NOTE — Telephone Encounter (Signed)
Antibiotic prescription was sent to her pharmacy

## 2011-07-23 ENCOUNTER — Encounter: Payer: BC Managed Care – PPO | Admitting: Family Medicine

## 2011-08-27 ENCOUNTER — Other Ambulatory Visit: Payer: Self-pay | Admitting: Family Medicine

## 2011-08-27 NOTE — Telephone Encounter (Signed)
Not on current med list. OK to refill? Please advise.

## 2011-08-27 NOTE — Telephone Encounter (Signed)
Refill done.  

## 2011-08-27 NOTE — Telephone Encounter (Signed)
Currently on Dymista but  if she likes to go back to Flonase is okay #1 and 6 refills

## 2011-11-05 ENCOUNTER — Ambulatory Visit (INDEPENDENT_AMBULATORY_CARE_PROVIDER_SITE_OTHER): Payer: BC Managed Care – PPO | Admitting: Family Medicine

## 2011-11-05 ENCOUNTER — Encounter: Payer: Self-pay | Admitting: Family Medicine

## 2011-11-05 VITALS — BP 121/68 | HR 61 | Temp 97.3°F | Ht 63.0 in | Wt 125.2 lb

## 2011-11-05 DIAGNOSIS — J329 Chronic sinusitis, unspecified: Secondary | ICD-10-CM | POA: Insufficient documentation

## 2011-11-05 MED ORDER — AMOXICILLIN 875 MG PO TABS: 875.0000 mg | ORAL_TABLET | Freq: Two times a day (BID) | ORAL | Status: AC

## 2011-11-05 NOTE — Assessment & Plan Note (Signed)
New.  Start daily OTC antihistamine.  abx for infxn.  Reviewed supportive care and red flags that should prompt return.  Pt expressed understanding and is in agreement w/ plan.

## 2011-11-05 NOTE — Progress Notes (Signed)
  Subjective:    Patient ID: [name], female    DOB: 02-20-1956, 56 y.o.   MRN: [id number]  HPI ? Sinus infxn- sxs started 2-3 days ago w/ facial pain/pressure.  R>L.  Taking tylenol w/ some relief.  + tooth pain.  Minimal nasal congestion.  No fevers.  R ear fullness.  + cough.  Hx of seasonal allergies.  No known sick contacts.   Review of Systems For ROS see HPI     Objective:   Physical Exam  Vitals reviewed. Constitutional: She appears well-developed and well-nourished. No distress.  HENT:  Head: Normocephalic and atraumatic.  Right Ear: Tympanic membrane normal.  Left Ear: Tympanic membrane normal.  Nose: Mucosal edema and rhinorrhea present. Right sinus exhibits maxillary sinus tenderness and frontal sinus tenderness. Left sinus exhibits no maxillary sinus tenderness and no frontal sinus tenderness.  Mouth/Throat: Uvula is midline and mucous membranes are normal. Posterior oropharyngeal erythema present. No oropharyngeal exudate.  Eyes: Conjunctivae normal and EOM are normal. Pupils are equal, round, and reactive to light.  Neck: Normal range of motion. Neck supple.  Cardiovascular: Normal rate, regular rhythm and normal heart sounds.   Pulmonary/Chest: Effort normal and breath sounds normal. No respiratory distress. She has no wheezes.  Lymphadenopathy:    She has no cervical adenopathy.          Assessment & Plan:

## 2011-11-05 NOTE — Patient Instructions (Addendum)
This appears to be a sinus infection Start the Amoxicillin twice daily- take w/ food Start Claritin or Zyrtec daily for the seasonal allergy component Drink plenty of fluids REST! Hang in there!!

## 2012-03-01 ENCOUNTER — Encounter: Payer: Self-pay | Admitting: Internal Medicine

## 2012-03-01 ENCOUNTER — Ambulatory Visit (INDEPENDENT_AMBULATORY_CARE_PROVIDER_SITE_OTHER): Payer: BC Managed Care – PPO | Admitting: Internal Medicine

## 2012-03-01 VITALS — BP 126/88 | HR 95 | Temp 98.9°F | Wt 126.4 lb

## 2012-03-01 DIAGNOSIS — J069 Acute upper respiratory infection, unspecified: Secondary | ICD-10-CM

## 2012-03-01 MED ORDER — AMOXICILLIN 500 MG PO CAPS: 500.0000 mg | ORAL_CAPSULE | Freq: Three times a day (TID) | ORAL | Status: DC

## 2012-03-01 NOTE — Progress Notes (Signed)
  Subjective:    Patient ID: [name], female    DOB: 1955-09-05, 57 y.o.   MRN: [id number]  HPI The respiratory tract symptoms began late 12/13 as ear pressure on R w/o discharge; Sudafed helped but symptoms recurred.  Significant associated symptoms included R facial pain & dental pain. Fever to 101 @ night w/o chills and sweats was present   Sputum was described as scant but green  She had  OS watering with crusting   There is no history of asthma. The patient had  quit smoking in 1984                  Review of SystemsSymptoms not present included frontal headache, sore throat, nasal purulence  Itchy eyes & sneezing were not noted.      Objective:   Physical Exam  General appearance:good health ;well nourished; no acute distress or increased work of breathing is present.  No  lymphadenopathy about the head, neck, or axilla noted.  Eyes: No conjunctival inflammation or lid edema is present. EOMI ; vision normal with lenses Ears:  External ear exam shows no significant lesions or deformities.  Otoscopic examination reveals clear canals, tympanic membranes are intact bilaterally without bulging, retraction, inflammation or discharge. Nose:  External nasal examination shows no deformity or inflammation. Nasal mucosa are pink and moist without lesions or exudates. No septal dislocation or deviation.No obstruction to airflow. Hyponasal speech. Sniffling repeatedly Oral exam: Dental hygiene is good; lips and gums are healthy appearing.There is no oropharyngeal erythema or exudate noted.  Neck:  No deformities,  masses, or tenderness noted.    Heart:  Normal rate and regular rhythm. S1 and S2 normal without gallop, murmur, click, rub or other extra sounds.  Lungs:Chest clear to auscultation; no wheezes, rhonchi,rales ,or rubs present.No increased work of breathing.   Extremities:  No cyanosis, edema, or clubbing  noted  Skin: Warm & dry .        Assessment & Plan:    #1 URI Plan: See orders and recommendations

## 2012-03-01 NOTE — Patient Instructions (Addendum)
Plain Mucinex for thick secretions ;force NON dairy fluids . Use a Neti pot daily as needed for sinus congestion; going from open side to congested side . Nasal cleansing in the shower as discussed. Make sure that all residual soap is removed to prevent irritation. Fluticasone 1 spray in each nostril twice a day as needed. Use the "crossover" technique as discussed. Plain Allegra 160  or Loratidine 10 mg daily as needed for itchy eyes & sneezing.

## 2012-05-10 ENCOUNTER — Encounter: Payer: Self-pay | Admitting: Family Medicine

## 2012-05-16 ENCOUNTER — Ambulatory Visit (INDEPENDENT_AMBULATORY_CARE_PROVIDER_SITE_OTHER): Payer: BC Managed Care – PPO | Admitting: Family Medicine

## 2012-05-16 ENCOUNTER — Encounter: Payer: Self-pay | Admitting: Family Medicine

## 2012-05-16 VITALS — BP 130/82 | HR 66 | Temp 98.4°F | Wt 130.0 lb

## 2012-05-16 DIAGNOSIS — J019 Acute sinusitis, unspecified: Secondary | ICD-10-CM

## 2012-05-16 MED ORDER — AZELASTINE-FLUTICASONE 137-50 MCG/ACT NA SUSP: 1.0000 | Freq: Two times a day (BID) | NASAL | Status: DC

## 2012-05-16 MED ORDER — CEFUROXIME AXETIL 500 MG PO TABS: 500.0000 mg | ORAL_TABLET | Freq: Two times a day (BID) | ORAL | Status: AC

## 2012-05-16 NOTE — Patient Instructions (Signed)

## 2012-05-16 NOTE — Progress Notes (Signed)
  Subjective:     [name] is a 57 y.o. female who presents for evaluation of sinus pain. Symptoms include: congestion, facial pain, headaches, nasal congestion, sinus pressure and tooth pain. Onset of symptoms was 4 days ago. Symptoms have been gradually worsening since that time. Past history is significant for no history of pneumonia or bronchitis. Patient is a non-smoker.  The following portions of the patient's history were reviewed and updated as appropriate: allergies, current medications, past family history, past medical history, past social history, past surgical history and problem list.  Review of Systems Pertinent items are noted in HPI.   Objective:    BP 130/82  Pulse 66  Temp(Src) 98.4 F (36.9 C) (Oral)  Wt 130 lb (58.968 kg)  BMI 23.03 kg/m2  SpO2 97% General appearance: alert, cooperative, appears stated age and no distress Ears: normal TM's and external ear canals both ears Nose: green discharge, moderate congestion, turbinates red, swollen, sinus tenderness right Throat: lips, mucosa, and tongue normal; teeth and gums normal Neck: mild anterior cervical adenopathy and thyroid not enlarged, symmetric, no tenderness/mass/nodules Lungs: clear to auscultation bilaterally    Assessment:    Acute bacterial sinusitis.    Plan:    Nasal steroids per medication orders. Antihistamines per medication orders. Ceftin per medication orders. rto prn

## 2012-06-13 ENCOUNTER — Telehealth: Payer: Self-pay | Admitting: General Practice

## 2012-06-13 NOTE — Telephone Encounter (Signed)
PA started on Pt for Dymista [id number]. Paperwork faxed on 06/13/12. JLT

## 2012-06-23 ENCOUNTER — Other Ambulatory Visit: Payer: Self-pay | Admitting: General Practice

## 2012-06-23 DIAGNOSIS — J019 Acute sinusitis, unspecified: Secondary | ICD-10-CM

## 2012-06-23 MED ORDER — AZELASTINE-FLUTICASONE 137-50 MCG/ACT NA SUSP: 1.0000 | Freq: Two times a day (BID) | NASAL | Status: DC

## 2012-06-23 NOTE — Telephone Encounter (Signed)
[facility] called. Notified PA was approved from 05/14/12-06/16/13. Med resent to pharmacy and pt notified.

## 2012-08-03 ENCOUNTER — Other Ambulatory Visit: Payer: Self-pay | Admitting: Family Medicine

## 2012-08-17 ENCOUNTER — Ambulatory Visit (INDEPENDENT_AMBULATORY_CARE_PROVIDER_SITE_OTHER): Payer: BC Managed Care – PPO | Admitting: Family Medicine

## 2012-08-17 ENCOUNTER — Encounter: Payer: Self-pay | Admitting: Family Medicine

## 2012-08-17 ENCOUNTER — Other Ambulatory Visit (HOSPITAL_COMMUNITY)
Admission: RE | Admit: 2012-08-17 | Discharge: 2012-08-17 | Disposition: A | Discharge status: Home / Self Care | Payer: BC Managed Care – PPO | Source: Ambulatory Visit | Attending: Family Medicine | Admitting: Family Medicine

## 2012-08-17 VITALS — BP 120/82 | HR 52 | Temp 97.9°F | Ht 63.0 in | Wt 128.8 lb

## 2012-08-17 DIAGNOSIS — J309 Allergic rhinitis, unspecified: Secondary | ICD-10-CM

## 2012-08-17 DIAGNOSIS — I10 Essential (primary) hypertension: Secondary | ICD-10-CM

## 2012-08-17 DIAGNOSIS — Z0000 Encounter for general adult medical examination without abnormal findings: Secondary | ICD-10-CM

## 2012-08-17 DIAGNOSIS — Z1151 Encounter for screening for human papillomavirus (HPV): Secondary | ICD-10-CM | POA: Insufficient documentation

## 2012-08-17 DIAGNOSIS — Z124 Encounter for screening for malignant neoplasm of cervix: Secondary | ICD-10-CM

## 2012-08-17 DIAGNOSIS — J302 Other seasonal allergic rhinitis: Secondary | ICD-10-CM

## 2012-08-17 DIAGNOSIS — Z01419 Encounter for gynecological examination (general) (routine) without abnormal findings: Secondary | ICD-10-CM | POA: Insufficient documentation

## 2012-08-17 LAB — CBC WITH DIFFERENTIAL/PLATELET
Basophils Relative: 0.9 % (ref 0.0–3.0)
Eosinophils Absolute: 0.1 10*3/uL (ref 0.0–0.7)
HCT: 39.2 % (ref 36.0–46.0)
Hemoglobin: 13.3 g/dL (ref 12.0–15.0)
Lymphocytes Relative: 39.6 % (ref 12.0–46.0)
MCHC: 33.9 g/dL (ref 30.0–36.0)
MCV: 88.9 fl (ref 78.0–100.0)
Monocytes Absolute: 0.3 10*3/uL (ref 0.1–1.0)
Neutro Abs: 2.4 10*3/uL (ref 1.4–7.7)
RBC: 4.41 Mil/uL (ref 3.87–5.11)

## 2012-08-17 LAB — HEPATIC FUNCTION PANEL
AST: 16 U/L (ref 0–37)
Albumin: 4.3 g/dL (ref 3.5–5.2)
Alkaline Phosphatase: 97 U/L (ref 39–117)
Total Protein: 7.7 g/dL (ref 6.0–8.3)

## 2012-08-17 LAB — LIPID PANEL: HDL: 65.4 mg/dL (ref >39.00)

## 2012-08-17 LAB — BASIC METABOLIC PANEL
CO2: 26 mEq/L (ref 19–32)
Chloride: 99 mEq/L (ref 96–112)
Creatinine, Ser: 0.6 mg/dL (ref 0.4–1.2)
GFR: 120.08 mL/min (ref >60.00)
Potassium: 3.3 mEq/L — ABNORMAL LOW (ref 3.5–5.1)
Sodium: 136 mEq/L (ref 135–145)

## 2012-08-17 LAB — POCT URINALYSIS DIPSTICK
Bilirubin, UA: NEGATIVE
Blood, UA: NEGATIVE
Ketones, UA: NEGATIVE
Leukocytes, UA: NEGATIVE
pH, UA: 8

## 2012-08-17 LAB — TSH: TSH: 0.79 u[IU]/mL (ref 0.35–5.50)

## 2012-08-17 MED ORDER — MONTELUKAST SODIUM 10 MG PO TABS: 10.0000 mg | ORAL_TABLET | Freq: Every day | ORAL | Status: DC

## 2012-08-17 NOTE — Patient Instructions (Addendum)
Preventive Care for Adults, Female A healthy lifestyle and preventive care can promote health and wellness. Preventive health guidelines for women include the following key practices.  A routine yearly physical is a good way to check with your caregiver about your health and preventive screening. It is a chance to share any concerns and updates on your health, and to receive a thorough exam.  Visit your dentist for a routine exam and preventive care every 6 months. Brush your teeth twice a day and floss once a day. Good oral hygiene prevents tooth decay and gum disease.  The frequency of eye exams is based on your age, health, family medical history, use of contact lenses, and other factors. Follow your caregiver's recommendations for frequency of eye exams.  Eat a healthy diet. Foods like vegetables, fruits, whole grains, low-fat dairy products, and lean protein foods contain the nutrients you need without too many calories. Decrease your intake of foods high in solid fats, added sugars, and salt. Eat the right amount of calories for you.Get information about a proper diet from your caregiver, if necessary.  Regular physical exercise is one of the most important things you can do for your health. Most adults should get at least 150 minutes of moderate-intensity exercise (any activity that increases your heart rate and causes you to sweat) each week. In addition, most adults need muscle-strengthening exercises on 2 or more days a week.  Maintain a healthy weight. The body mass index (BMI) is a screening tool to identify possible weight problems. It provides an estimate of body fat based on height and weight. Your caregiver can help determine your BMI, and can help you achieve or maintain a healthy weight.For adults 20 years and older:  A BMI below 18.5 is considered underweight.  A BMI of 18.5 to 24.9 is normal.  A BMI of 25 to 29.9 is considered overweight.  A BMI of 30 and above is  considered obese.  Maintain normal blood lipids and cholesterol levels by exercising and minimizing your intake of saturated fat. Eat a balanced diet with plenty of fruit and vegetables. Blood tests for lipids and cholesterol should begin at age 20 and be repeated every 5 years. If your lipid or cholesterol levels are high, you are over 50, or you are at high risk for heart disease, you may need your cholesterol levels checked more frequently.Ongoing high lipid and cholesterol levels should be treated with medicines if diet and exercise are not effective.  If you smoke, find out from your caregiver how to quit. If you do not use tobacco, do not start.  If you are pregnant, do not drink alcohol. If you are breastfeeding, be very cautious about drinking alcohol. If you are not pregnant and choose to drink alcohol, do not exceed 1 drink per day. One drink is considered to be 12 ounces (355 mL) of beer, 5 ounces (148 mL) of wine, or 1.5 ounces (44 mL) of liquor.  Avoid use of street drugs. Do not share needles with anyone. Ask for help if you need support or instructions about stopping the use of drugs.  High blood pressure causes heart disease and increases the risk of stroke. Your blood pressure should be checked at least every 1 to 2 years. Ongoing high blood pressure should be treated with medicines if weight loss and exercise are not effective.  If you are 55 to 59 years old, ask your caregiver if you should take aspirin to prevent strokes.  Diabetes   screening involves taking a blood sample to check your fasting blood sugar level. This should be done once every 3 years, after age 45, if you are within normal weight and without risk factors for diabetes. Testing should be considered at a younger age or be carried out more frequently if you are overweight and have at least 1 risk factor for diabetes.  Breast cancer screening is essential preventive care for women. You should practice "breast  self-awareness." This means understanding the normal appearance and feel of your breasts and may include breast self-examination. Any changes detected, no matter how small, should be reported to a caregiver. Women in their 20s and 30s should have a clinical breast exam (CBE) by a caregiver as part of a regular health exam every 1 to 3 years. After age 40, women should have a CBE every year. Starting at age 40, women should consider having a mammography (breast X-ray test) every year. Women who have a family history of breast cancer should talk to their caregiver about genetic screening. Women at a high risk of breast cancer should talk to their caregivers about having magnetic resonance imaging (MRI) and a mammography every year.  The Pap test is a screening test for cervical cancer. A Pap test can show cell changes on the cervix that might become cervical cancer if left untreated. A Pap test is a procedure in which cells are obtained and examined from the lower end of the uterus (cervix).  Women should have a Pap test starting at age 21.  Between ages 21 and 29, Pap tests should be repeated every 2 years.  Beginning at age 30, you should have a Pap test every 3 years as long as the past 3 Pap tests have been normal.  Some women have medical problems that increase the chance of getting cervical cancer. Talk to your caregiver about these problems. It is especially important to talk to your caregiver if a new problem develops soon after your last Pap test. In these cases, your caregiver may recommend more frequent screening and Pap tests.  The above recommendations are the same for women who have or have not gotten the vaccine for human papillomavirus (HPV).  If you had a hysterectomy for a problem that was not cancer or a condition that could lead to cancer, then you no longer need Pap tests. Even if you no longer need a Pap test, a regular exam is a good idea to make sure no other problems are  starting.  If you are between ages 65 and 70, and you have had normal Pap tests going back 10 years, you no longer need Pap tests. Even if you no longer need a Pap test, a regular exam is a good idea to make sure no other problems are starting.  If you have had past treatment for cervical cancer or a condition that could lead to cancer, you need Pap tests and screening for cancer for at least 20 years after your treatment.  If Pap tests have been discontinued, risk factors (such as a new sexual partner) need to be reassessed to determine if screening should be resumed.  The HPV test is an additional test that may be used for cervical cancer screening. The HPV test looks for the virus that can cause the cell changes on the cervix. The cells collected during the Pap test can be tested for HPV. The HPV test could be used to screen women aged 30 years and older, and should   be used in women of any age who have unclear Pap test results. After the age of 30, women should have HPV testing at the same frequency as a Pap test.  Colorectal cancer can be detected and often prevented. Most routine colorectal cancer screening begins at the age of 50 and continues through age 75. However, your caregiver may recommend screening at an earlier age if you have risk factors for colon cancer. On a yearly basis, your caregiver may provide home test kits to check for hidden blood in the stool. Use of a small camera at the end of a tube, to directly examine the colon (sigmoidoscopy or colonoscopy), can detect the earliest forms of colorectal cancer. Talk to your caregiver about this at age 50, when routine screening begins. Direct examination of the colon should be repeated every 5 to 10 years through age 75, unless early forms of pre-cancerous polyps or small growths are found.  Hepatitis C blood testing is recommended for all people born from 1945 through 1965 and any individual with known risks for hepatitis C.  Practice  safe sex. Use condoms and avoid high-risk sexual practices to reduce the spread of sexually transmitted infections (STIs). STIs include gonorrhea, chlamydia, syphilis, trichomonas, herpes, HPV, and human immunodeficiency virus (HIV). Herpes, HIV, and HPV are viral illnesses that have no cure. They can result in disability, cancer, and death. Sexually active women aged 25 and younger should be checked for chlamydia. Older women with new or multiple partners should also be tested for chlamydia. Testing for other STIs is recommended if you are sexually active and at increased risk.  Osteoporosis is a disease in which the bones lose minerals and strength with aging. This can result in serious bone fractures. The risk of osteoporosis can be identified using a bone density scan. Women ages 65 and over and women at risk for fractures or osteoporosis should discuss screening with their caregivers. Ask your caregiver whether you should take a calcium supplement or vitamin D to reduce the rate of osteoporosis.  Menopause can be associated with physical symptoms and risks. Hormone replacement therapy is available to decrease symptoms and risks. You should talk to your caregiver about whether hormone replacement therapy is right for you.  Use sunscreen with sun protection factor (SPF) of 30 or more. Apply sunscreen liberally and repeatedly throughout the day. You should seek shade when your shadow is shorter than you. Protect yourself by wearing long sleeves, pants, a wide-brimmed hat, and sunglasses year round, whenever you are outdoors.  Once a month, do a whole body skin exam, using a mirror to look at the skin on your back. Notify your caregiver of new moles, moles that have irregular borders, moles that are larger than a pencil eraser, or moles that have changed in shape or color.  Stay current with required immunizations.  Influenza. You need a dose every fall (or winter). The composition of the flu vaccine  changes each year, so being vaccinated once is not enough.  Pneumococcal polysaccharide. You need 1 to 2 doses if you smoke cigarettes or if you have certain chronic medical conditions. You need 1 dose at age 65 (or older) if you have never been vaccinated.  Tetanus, diphtheria, pertussis (Tdap, Td). Get 1 dose of Tdap vaccine if you are younger than age 65, are over 65 and have contact with an infant, are a healthcare worker, are pregnant, or simply want to be protected from whooping cough. After that, you need a Td   booster dose every 10 years. Consult your caregiver if you have not had at least 3 tetanus and diphtheria-containing shots sometime in your life or have a deep or dirty wound.  HPV. You need this vaccine if you are a woman age 26 or younger. The vaccine is given in 3 doses over 6 months.  Measles, mumps, rubella (MMR). You need at least 1 dose of MMR if you were born in 1957 or later. You may also need a second dose.  Meningococcal. If you are age 19 to 21 and a first-year college student living in a residence hall, or have one of several medical conditions, you need to get vaccinated against meningococcal disease. You may also need additional booster doses.  Zoster (shingles). If you are age 60 or older, you should get this vaccine.  Varicella (chickenpox). If you have never had chickenpox or you were vaccinated but received only 1 dose, talk to your caregiver to find out if you need this vaccine.  Hepatitis A. You need this vaccine if you have a specific risk factor for hepatitis A virus infection or you simply wish to be protected from this disease. The vaccine is usually given as 2 doses, 6 to 18 months apart.  Hepatitis B. You need this vaccine if you have a specific risk factor for hepatitis B virus infection or you simply wish to be protected from this disease. The vaccine is given in 3 doses, usually over 6 months. Preventive Services / Frequency Ages 19 to 39  Blood  pressure check.** / Every 1 to 2 years.  Lipid and cholesterol check.** / Every 5 years beginning at age 20.  Clinical breast exam.** / Every 3 years for women in their 20s and 30s.  Pap test.** / Every 2 years from ages 21 through 29. Every 3 years starting at age 30 through age 65 or 70 with a history of 3 consecutive normal Pap tests.  HPV screening.** / Every 3 years from ages 30 through ages 65 to 70 with a history of 3 consecutive normal Pap tests.  Hepatitis C blood test.** / For any individual with known risks for hepatitis C.  Skin self-exam. / Monthly.  Influenza immunization.** / Every year.  Pneumococcal polysaccharide immunization.** / 1 to 2 doses if you smoke cigarettes or if you have certain chronic medical conditions.  Tetanus, diphtheria, pertussis (Tdap, Td) immunization. / A one-time dose of Tdap vaccine. After that, you need a Td booster dose every 10 years.  HPV immunization. / 3 doses over 6 months, if you are 26 and younger.  Measles, mumps, rubella (MMR) immunization. / You need at least 1 dose of MMR if you were born in 1957 or later. You may also need a second dose.  Meningococcal immunization. / 1 dose if you are age 19 to 21 and a first-year college student living in a residence hall, or have one of several medical conditions, you need to get vaccinated against meningococcal disease. You may also need additional booster doses.  Varicella immunization.** / Consult your caregiver.  Hepatitis A immunization.** / Consult your caregiver. 2 doses, 6 to 18 months apart.  Hepatitis B immunization.** / Consult your caregiver. 3 doses usually over 6 months. Ages 40 to 64  Blood pressure check.** / Every 1 to 2 years.  Lipid and cholesterol check.** / Every 5 years beginning at age 20.  Clinical breast exam.** / Every year after age 40.  Mammogram.** / Every year beginning at age 40   and continuing for as long as you are in good health. Consult with your  caregiver.  Pap test.** / Every 3 years starting at age 30 through age 65 or 70 with a history of 3 consecutive normal Pap tests.  HPV screening.** / Every 3 years from ages 30 through ages 65 to 70 with a history of 3 consecutive normal Pap tests.  Fecal occult blood test (FOBT) of stool. / Every year beginning at age 50 and continuing until age 75. You may not need to do this test if you get a colonoscopy every 10 years.  Flexible sigmoidoscopy or colonoscopy.** / Every 5 years for a flexible sigmoidoscopy or every 10 years for a colonoscopy beginning at age 50 and continuing until age 75.  Hepatitis C blood test.** / For all people born from 1945 through 1965 and any individual with known risks for hepatitis C.  Skin self-exam. / Monthly.  Influenza immunization.** / Every year.  Pneumococcal polysaccharide immunization.** / 1 to 2 doses if you smoke cigarettes or if you have certain chronic medical conditions.  Tetanus, diphtheria, pertussis (Tdap, Td) immunization.** / A one-time dose of Tdap vaccine. After that, you need a Td booster dose every 10 years.  Measles, mumps, rubella (MMR) immunization. / You need at least 1 dose of MMR if you were born in 1957 or later. You may also need a second dose.  Varicella immunization.** / Consult your caregiver.  Meningococcal immunization.** / Consult your caregiver.  Hepatitis A immunization.** / Consult your caregiver. 2 doses, 6 to 18 months apart.  Hepatitis B immunization.** / Consult your caregiver. 3 doses, usually over 6 months. Ages 65 and over  Blood pressure check.** / Every 1 to 2 years.  Lipid and cholesterol check.** / Every 5 years beginning at age 20.  Clinical breast exam.** / Every year after age 40.  Mammogram.** / Every year beginning at age 40 and continuing for as long as you are in good health. Consult with your caregiver.  Pap test.** / Every 3 years starting at age 30 through age 65 or 70 with a 3  consecutive normal Pap tests. Testing can be stopped between 65 and 70 with 3 consecutive normal Pap tests and no abnormal Pap or HPV tests in the past 10 years.  HPV screening.** / Every 3 years from ages 30 through ages 65 or 70 with a history of 3 consecutive normal Pap tests. Testing can be stopped between 65 and 70 with 3 consecutive normal Pap tests and no abnormal Pap or HPV tests in the past 10 years.  Fecal occult blood test (FOBT) of stool. / Every year beginning at age 50 and continuing until age 75. You may not need to do this test if you get a colonoscopy every 10 years.  Flexible sigmoidoscopy or colonoscopy.** / Every 5 years for a flexible sigmoidoscopy or every 10 years for a colonoscopy beginning at age 50 and continuing until age 75.  Hepatitis C blood test.** / For all people born from 1945 through 1965 and any individual with known risks for hepatitis C.  Osteoporosis screening.** / A one-time screening for women ages 65 and over and women at risk for fractures or osteoporosis.  Skin self-exam. / Monthly.  Influenza immunization.** / Every year.  Pneumococcal polysaccharide immunization.** / 1 dose at age 65 (or older) if you have never been vaccinated.  Tetanus, diphtheria, pertussis (Tdap, Td) immunization. / A one-time dose of Tdap vaccine if you are over   65 and have contact with an infant, are a healthcare worker, or simply want to be protected from whooping cough. After that, you need a Td booster dose every 10 years.  Varicella immunization.** / Consult your caregiver.  Meningococcal immunization.** / Consult your caregiver.  Hepatitis A immunization.** / Consult your caregiver. 2 doses, 6 to 18 months apart.  Hepatitis B immunization.** / Check with your caregiver. 3 doses, usually over 6 months. ** Family history and personal history of risk and conditions may change your caregiver's recommendations. Document Released: 04/06/2001 Document Revised: 05/03/2011  Document Reviewed: 07/06/2010 [employer] Patient Information 2014 [employer]  

## 2012-08-17 NOTE — Progress Notes (Signed)
Subjective:     [name] is a 57 y.o. female and is here for a comprehensive physical exam. The patient reports no problems.  History   Social History  . Marital Status: Married    Spouse Name: N/A    Number of Children: N/A  . Years of Education: N/A   Occupational History  . [employer]   Social History Main Topics  . Smoking status: Former Smoker -- 1.00 packs/day for 14 years    Types: Cigarettes    Quit date: 07/11/1982  . Smokeless tobacco: Not on file  . Alcohol Use: Yes     Comment: rare  . Drug Use: No  . Sexually Active: Yes -- Female partner(s)   Other Topics Concern  . Not on file   Social History Narrative   Exercise-- jazzersize  3x a week    Health Maintenance  Topic Date Due  . Influenza Vaccine  10/23/2012  . Mammogram  04/10/2014  . Pap Smear  07/14/2014  . Colonoscopy  06/06/2017  . Tetanus/tdap  04/18/2018    The following portions of the patient's history were reviewed and updated as appropriate:  She  has a past medical history of Hypertension; Allergy; and H/O Bell's palsy (05/2010). She  does not have any pertinent problems on file. She  has past surgical history that includes Tubal ligation. Her family history includes Cancer in her maternal grandmother; Diabetes in her maternal grandmother; Heart disease in her father; Hypertension in her father and mother; and Stroke in her mother. She  reports that she quit smoking about 30 years ago. Her smoking use included Cigarettes. She has a 14 pack-year smoking history. She does not have any smokeless tobacco history on file. She reports that  drinks alcohol. She reports that she does not use illicit drugs. She has a current medication list which includes the following prescription(s): azelastine-fluticasone, chlorpheniramine, diovan, and spironolactone. Current Outpatient Prescriptions on File Prior to Visit  Medication Sig Dispense Refill  .  Azelastine-Fluticasone (DYMISTA) 137-50 MCG/ACT SUSP Place 1 spray into the nose 2 (two) times daily.  1 Bottle  5  . DIOVAN 160 MG tablet take 1 tablet by mouth once daily  90 tablet  1  . spironolactone (ALDACTONE) 25 MG tablet take 1 tablet by mouth once daily  90 tablet  1   No current facility-administered medications on file prior to visit.   She has No Known Allergies..  Review of Systems Review of Systems  Constitutional: Negative for activity change, appetite change and fatigue.  HENT: Negative for hearing loss, congestion, tinnitus and ear discharge.  dentist q58m Eyes: Negative for visual disturbance (see optho q1y -- vision corrected to 20/20 with glasses).  Respiratory: Negative for cough, chest tightness and shortness of breath.   Cardiovascular: Negative for chest pain, palpitations and leg swelling.  Gastrointestinal: Negative for abdominal pain, diarrhea, constipation and abdominal distention.  Genitourinary: Negative for urgency, frequency, decreased urine volume and difficulty urinating.  Musculoskeletal: Negative for back pain, arthralgias and gait problem.  Skin: Negative for color change, pallor and rash.  Neurological: Negative for dizziness, light-headedness, numbness and headaches.  Hematological: Negative for adenopathy. Does not bruise/bleed easily.  Psychiatric/Behavioral: Negative for suicidal ideas, confusion, sleep disturbance, self-injury, dysphoric mood, decreased concentration and agitation.       Objective:    BP 120/82  Pulse 52  Temp(Src) 97.9 F (36.6 C) (Oral)  Ht 5\' 3"  (1.6 m)  Wt 128 lb  12.8 oz (58.423 kg)  BMI 22.82 kg/m2  SpO2 97% General appearance: alert, cooperative, appears stated age and no distress Head: Normocephalic, without obvious abnormality, atraumatic Eyes: conjunctivae/corneas clear. PERRL, EOM's intact. Fundi benign. Ears: normal TM's and external ear canals both ears Nose: Nares normal. Septum midline. Mucosa normal.  No drainage or sinus tenderness. Throat: lips, mucosa, and tongue normal; teeth and gums normal Neck: no adenopathy, no carotid bruit, no JVD, supple, symmetrical, trachea midline and thyroid not enlarged, symmetric, no tenderness/mass/nodules Back: symmetric, no curvature. ROM normal. No CVA tenderness. Lungs: clear to auscultation bilaterally Breasts: normal appearance, no masses or tenderness Heart: regular rate and rhythm, S1, S2 normal, no murmur, click, rub or gallop Abdomen: soft, non-tender; bowel sounds normal; no masses,  no organomegaly Pelvic: cervix normal in appearance, external genitalia normal, no adnexal masses or tenderness, no cervical motion tenderness, rectovaginal septum normal, uterus normal size, shape, and consistency and vagina normal without discharge--pap done Extremities: extremities normal, atraumatic, no cyanosis or edema Pulses: 2+ and symmetric Skin: Skin color, texture, turgor normal. No rashes or lesions Lymph nodes: Cervical, supraclavicular, and axillary nodes normal. Neurologic: Alert and oriented X 3, normal strength and tone. Normal symmetric reflexes. Normal coordination and gait Psych-- no depression, no anxiety      Assessment:    Healthy female exam.       Plan:  Check labs  ghm utd See avs   See After Visit Summary for Counseling Recommendations

## 2012-08-18 NOTE — Assessment & Plan Note (Signed)
Stable con't meds 

## 2012-09-17 ENCOUNTER — Other Ambulatory Visit: Payer: Self-pay | Admitting: Family Medicine

## 2012-12-18 ENCOUNTER — Ambulatory Visit (HOSPITAL_BASED_OUTPATIENT_CLINIC_OR_DEPARTMENT_OTHER)
Admission: RE | Admit: 2012-12-18 | Discharge: 2012-12-18 | Disposition: A | Discharge status: Home / Self Care | Payer: BC Managed Care – PPO | Source: Ambulatory Visit | Attending: Internal Medicine | Admitting: Internal Medicine

## 2012-12-18 ENCOUNTER — Ambulatory Visit (INDEPENDENT_AMBULATORY_CARE_PROVIDER_SITE_OTHER): Payer: BC Managed Care – PPO | Admitting: Internal Medicine

## 2012-12-18 ENCOUNTER — Encounter: Payer: Self-pay | Admitting: Internal Medicine

## 2012-12-18 ENCOUNTER — Other Ambulatory Visit: Payer: Self-pay | Admitting: *Deleted

## 2012-12-18 VITALS — BP 160/81 | HR 70 | Temp 98.9°F | Wt 127.0 lb

## 2012-12-18 DIAGNOSIS — I10 Essential (primary) hypertension: Secondary | ICD-10-CM

## 2012-12-18 DIAGNOSIS — R079 Chest pain, unspecified: Secondary | ICD-10-CM

## 2012-12-18 NOTE — Assessment & Plan Note (Signed)
BP slightly elevated today, recommend self-monitoring, see instructions

## 2012-12-18 NOTE — Progress Notes (Signed)
  Subjective:    Patient ID: [name], female    DOB: 1955/05/15, 57 y.o.   MRN: [id number]  HPI Acute visit 4 days history of steady pain at the epigastric, right upper quadrant area with some radiation to the back, discomfort is described as a heaviness, worse with certain motions-positions, no change with deep breath or cough. She has a history of spontaneous pneumothorax few years ago and is concerned about her lungs. Sx not related to food intake.  Past Medical History  Diagnosis Date  . Hypertension   . Allergy   . H/O Bell's palsy 05/2010   Past Surgical History  Procedure Laterality Date  . Tubal ligation     History   Social History  . Marital Status: Married    Spouse Name: N/A    Number of Children: N/A  . Years of Education: N/A   Occupational History  . [employer]   Social History Main Topics  . Smoking status: Former Smoker -- 1.00 packs/day for 14 years    Types: Cigarettes    Quit date: 07/11/1982  . Smokeless tobacco: Not on file  . Alcohol Use: Yes     Comment: rare  . Drug Use: No  . Sexual Activity: Yes    Partners: Male   Other Topics Concern  . Not on file   Social History Narrative   Exercise-- jazzersize  3x a week      Review of Systems No fever or chills No lower extremity edema or pain in the calves. No shortness of breath or wheezing. No rash No pain at the thoracic spine per se  No recent airplane trips. occ cough ("due to allergies") but no URI symptoms per se No classic GERD    Objective:   Physical Exam  Abdominal:     BP 160/81  Pulse 70  Temp(Src) 98.9 F (37.2 C)  Wt 127 lb (57.607 kg)  BMI 22.5 kg/m2  SpO2 99% General -- alert, well-developed, NAD.  Neck --no mass or  LAD HEENT-- Not pale.   Lungs -- normal respiratory effort, no intercostal retractions, no accessory muscle use, and normal breath sounds.  Heart-- normal rate, regular rhythm, no murmur.    Abdomen-- Not distended, good bowel sounds,soft, non-tender. No rebound or rigidity. No mass,organomegaly. Skin-- no lesions at chest or abdomen Extremities-- no pretibial edema bilaterally , calves symmetric Neurologic--  alert & oriented X3. Speech normal, gait normal, strength normal in all extremities.  Psych-- Cognition and judgment appear intact. Cooperative with normal attention span and concentration. No anxious appearing , no depressed appearing.     Assessment & Plan:

## 2012-12-18 NOTE — Assessment & Plan Note (Signed)
Right upper quadrant and CP, Exam is unremarkable, differential diagnosis includes  shingles, musculoskeletal issue, pneumothorax, pneumonia, pleural effusion, etc. Very low suspicious for a heart condition or pulmonary emboli. Plan: Chest x-ray Abdominal ultrasound Observation, patient to call immediately if there is a rash increased symptoms.

## 2012-12-18 NOTE — Patient Instructions (Signed)
Get the XR at [facility], corner of [address] (10 minutes form here); they are open 24/7 [address]  [place], [place] [address] [phone]  Check the  blood pressure 2 or 3 times a month  be sure it is between 110/60 and 140/85. Ideal blood pressure is 120/80. If it is consistently higher or lower, let me know  Call if fever, chills, shortness or breath, rash. Call anytime if your symptoms increase

## 2012-12-19 ENCOUNTER — Ambulatory Visit (HOSPITAL_BASED_OUTPATIENT_CLINIC_OR_DEPARTMENT_OTHER)
Admission: RE | Admit: 2012-12-19 | Discharge: 2012-12-19 | Disposition: A | Discharge status: Home / Self Care | Payer: BC Managed Care – PPO | Source: Ambulatory Visit | Attending: Internal Medicine | Admitting: Internal Medicine

## 2012-12-19 DIAGNOSIS — R1011 Right upper quadrant pain: Secondary | ICD-10-CM | POA: Insufficient documentation

## 2012-12-19 DIAGNOSIS — R079 Chest pain, unspecified: Secondary | ICD-10-CM

## 2012-12-22 ENCOUNTER — Telehealth: Payer: Self-pay | Admitting: *Deleted

## 2012-12-25 ENCOUNTER — Telehealth: Payer: Self-pay | Admitting: *Deleted

## 2012-12-25 ENCOUNTER — Other Ambulatory Visit (INDEPENDENT_AMBULATORY_CARE_PROVIDER_SITE_OTHER): Payer: BC Managed Care – PPO

## 2012-12-25 ENCOUNTER — Other Ambulatory Visit: Payer: Self-pay | Admitting: Family Medicine

## 2012-12-25 DIAGNOSIS — I10 Essential (primary) hypertension: Secondary | ICD-10-CM

## 2012-12-25 DIAGNOSIS — R160 Hepatomegaly, not elsewhere classified: Secondary | ICD-10-CM

## 2012-12-25 LAB — BASIC METABOLIC PANEL
BUN: 12 mg/dL (ref 6–23)
CO2: 29 mEq/L (ref 19–32)
Calcium: 9.3 mg/dL (ref 8.4–10.5)
Creatinine, Ser: 0.7 mg/dL (ref 0.4–1.2)
GFR: 97.18 mL/min (ref >60.00)
Glucose, Bld: 101 mg/dL — ABNORMAL HIGH (ref 70–99)
Sodium: 141 mEq/L (ref 135–145)

## 2012-12-25 NOTE — Telephone Encounter (Signed)
Message copied by Eustace Quail on Mon Dec 25, 2012  2:10 PM ------      Message from: Willow Ora E      Created: Fri Dec 22, 2012  4:28 PM       [name], please call the patient:      Ultrasound showed a normal gallbladder.      He does have a spot in theliver that this very likely a benign condition called hemangioma, the only way to be 100% certain about this by doing an MRI.       I suggest he discuss that with Dr. Redmond Baseman when he comes back for a checkup .      If the pain is not going away, let me know. ------

## 2012-12-25 NOTE — Telephone Encounter (Signed)
Pt notified. Has a MRI apt 12/26/12 0930.

## 2012-12-25 NOTE — Telephone Encounter (Signed)
error 

## 2012-12-26 ENCOUNTER — Ambulatory Visit (INDEPENDENT_AMBULATORY_CARE_PROVIDER_SITE_OTHER): Payer: BC Managed Care – PPO

## 2012-12-26 DIAGNOSIS — R160 Hepatomegaly, not elsewhere classified: Secondary | ICD-10-CM

## 2012-12-26 DIAGNOSIS — D1809 Hemangioma of other sites: Secondary | ICD-10-CM

## 2012-12-26 MED ORDER — GADOBENATE DIMEGLUMINE 529 MG/ML IV SOLN: 10.0000 mL | Freq: Once | INTRAVENOUS | Status: AC | PRN

## 2012-12-28 ENCOUNTER — Telehealth: Payer: Self-pay | Admitting: *Deleted

## 2012-12-28 NOTE — Telephone Encounter (Signed)
Spoke with patient to make aware that recent MRI only showed a benign hemangioma and nothing to be concerned about.

## 2013-02-05 ENCOUNTER — Other Ambulatory Visit: Payer: Self-pay | Admitting: Family Medicine

## 2013-05-08 ENCOUNTER — Other Ambulatory Visit: Payer: Self-pay | Admitting: Family Medicine

## 2013-08-08 ENCOUNTER — Other Ambulatory Visit: Payer: Self-pay | Admitting: Family Medicine

## 2013-08-20 ENCOUNTER — Ambulatory Visit (INDEPENDENT_AMBULATORY_CARE_PROVIDER_SITE_OTHER): Payer: BC Managed Care – PPO | Admitting: Family Medicine

## 2013-08-20 ENCOUNTER — Encounter: Payer: Self-pay | Admitting: Family Medicine

## 2013-08-20 ENCOUNTER — Telehealth: Payer: Self-pay

## 2013-08-20 VITALS — BP 130/74 | HR 64 | Temp 98.1°F | Ht 63.75 in | Wt 129.4 lb

## 2013-08-20 DIAGNOSIS — Z0000 Encounter for general adult medical examination without abnormal findings: Secondary | ICD-10-CM

## 2013-08-20 DIAGNOSIS — J309 Allergic rhinitis, unspecified: Secondary | ICD-10-CM

## 2013-08-20 DIAGNOSIS — I10 Essential (primary) hypertension: Secondary | ICD-10-CM

## 2013-08-20 DIAGNOSIS — Z9109 Other allergy status, other than to drugs and biological substances: Secondary | ICD-10-CM

## 2013-08-20 DIAGNOSIS — Z23 Encounter for immunization: Secondary | ICD-10-CM

## 2013-08-20 DIAGNOSIS — Z2911 Encounter for prophylactic immunotherapy for respiratory syncytial virus (RSV): Secondary | ICD-10-CM

## 2013-08-20 DIAGNOSIS — J302 Other seasonal allergic rhinitis: Secondary | ICD-10-CM

## 2013-08-20 LAB — CBC WITH DIFFERENTIAL/PLATELET
BASOS PCT: 0.7 % (ref 0.0–3.0)
Basophils Absolute: 0 10*3/uL (ref 0.0–0.1)
Eosinophils Absolute: 0.2 10*3/uL (ref 0.0–0.7)
Eosinophils Relative: 3.4 % (ref 0.0–5.0)
HCT: 41.1 % (ref 36.0–46.0)
HEMOGLOBIN: 14 g/dL (ref 12.0–15.0)
LYMPHS PCT: 34.9 % (ref 12.0–46.0)
Lymphs Abs: 1.8 10*3/uL (ref 0.7–4.0)
MCHC: 34 g/dL (ref 30.0–36.0)
MCV: 87.6 fl (ref 78.0–100.0)
Monocytes Absolute: 0.3 10*3/uL (ref 0.1–1.0)
Monocytes Relative: 5.5 % (ref 3.0–12.0)
NEUTROS ABS: 2.9 10*3/uL (ref 1.4–7.7)
Neutrophils Relative %: 55.5 % (ref 43.0–77.0)
Platelets: 240 10*3/uL (ref 150.0–400.0)
RBC: 4.69 Mil/uL (ref 3.87–5.11)
RDW: 13.3 % (ref 11.5–15.5)
WBC: 5.3 10*3/uL (ref 4.0–10.5)

## 2013-08-20 LAB — HEPATIC FUNCTION PANEL
ALBUMIN: 4.5 g/dL (ref 3.5–5.2)
ALT: 19 U/L (ref 0–35)
AST: 22 U/L (ref 0–37)
Alkaline Phosphatase: 102 U/L (ref 39–117)
Bilirubin, Direct: 0.1 mg/dL (ref 0.0–0.3)
TOTAL PROTEIN: 7.7 g/dL (ref 6.0–8.3)
Total Bilirubin: 0.7 mg/dL (ref 0.2–1.2)

## 2013-08-20 LAB — LIPID PANEL
CHOLESTEROL: 199 mg/dL (ref 0–200)
HDL: 63.4 mg/dL (ref >39.00)
LDL Cholesterol: 121 mg/dL — ABNORMAL HIGH (ref 0–99)
NonHDL: 135.6
TRIGLYCERIDES: 72 mg/dL (ref 0.0–149.0)
Total CHOL/HDL Ratio: 3
VLDL: 14.4 mg/dL (ref 0.0–40.0)

## 2013-08-20 LAB — BASIC METABOLIC PANEL
BUN: 11 mg/dL (ref 6–23)
CALCIUM: 9.3 mg/dL (ref 8.4–10.5)
CO2: 32 meq/L (ref 19–32)
CREATININE: 0.7 mg/dL (ref 0.4–1.2)
Chloride: 103 mEq/L (ref 96–112)
GFR: 98.69 mL/min (ref >60.00)
GLUCOSE: 94 mg/dL (ref 70–99)
Potassium: 3.8 mEq/L (ref 3.5–5.1)
Sodium: 140 mEq/L (ref 135–145)

## 2013-08-20 LAB — TSH: TSH: 1.04 u[IU]/mL (ref 0.35–4.50)

## 2013-08-20 MED ORDER — MONTELUKAST SODIUM 10 MG PO TABS: 10.0000 mg | ORAL_TABLET | Freq: Every day | ORAL | Status: DC

## 2013-08-20 MED ORDER — VALSARTAN 160 MG PO TABS: ORAL_TABLET | ORAL | Status: DC

## 2013-08-20 MED ORDER — SPIRONOLACTONE 25 MG PO TABS: ORAL_TABLET | ORAL | Status: DC

## 2013-08-20 NOTE — Telephone Encounter (Signed)
Copy being faxed         KP

## 2013-08-20 NOTE — Progress Notes (Signed)
Pre visit review using our clinic review tool, if applicable. No additional management support is needed unless otherwise documented below in the visit note. 

## 2013-08-20 NOTE — Telephone Encounter (Signed)
Message copied by Ewing Schlein on Mon Aug 20, 2013 10:26 AM ------      Message from: Rosalita Chessman      Created: Mon Aug 20, 2013  8:58 AM       Need mammogram from [facility] ---done jan/feb ------

## 2013-08-20 NOTE — Progress Notes (Signed)
Subjective:     [name] is a 58 y.o. female and is here for a comprehensive physical exam. The patient reports no problems.  History   Social History  . Marital Status: Married    Spouse Name: N/A    Number of Children: 3  . Years of Education: N/A   Occupational History  . [employer]   Social History Main Topics  . Smoking status: Former Smoker -- 1.00 packs/day for 14 years    Types: Cigarettes    Quit date: 07/11/1982  . Smokeless tobacco: Never Used  . Alcohol Use: Yes     Comment: rare  . Drug Use: No  . Sexual Activity: Yes    Partners: Male   Other Topics Concern  . Not on file   Social History Narrative   Exercise-- jazzersize  3x a week    Health Maintenance  Topic Date Due  . Influenza Vaccine  09/22/2013  . Mammogram  04/10/2014  . Pap Smear  08/18/2015  . Colonoscopy  06/06/2017  . Tetanus/tdap  04/18/2018  . Zostavax  Completed    The following portions of the patient's history were reviewed and updated as appropriate:  She  has a past medical history of Hypertension; Allergy; H/O Bell's palsy (05/2010); and Spontaneous pneumothorax (2005). She  does not have any pertinent problems on file. She  has past surgical history that includes Tubal ligation; Chest tube insertion (2005); and Video assisted thoracoscopy (2005). Her family history includes Cancer in her maternal grandmother; Diabetes in her maternal grandmother; Heart disease in her father; Hypertension in her father and mother; Stroke in her mother. She  reports that she quit smoking about 31 years ago. Her smoking use included Cigarettes. She has a 14 pack-year smoking history. She has never used smokeless tobacco. She reports that she drinks alcohol. She reports that she does not use illicit drugs. She has a current medication list which includes the following prescription(s): montelukast, sodium chloride, spironolactone, and valsartan.  She  has No Known Allergies..  Review of Systems Review of Systems  Constitutional: Negative for activity change, appetite change and fatigue.  HENT: Negative for hearing loss, congestion, tinnitus and ear discharge.  dentist q91m Eyes: Negative for visual disturbance (see optho q1y -- vision corrected to 20/20 with glasses).  Respiratory: Negative for cough, chest tightness and shortness of breath.   Cardiovascular: Negative for chest pain, palpitations and leg swelling.  Gastrointestinal: Negative for abdominal pain, diarrhea, constipation and abdominal distention.  Genitourinary: Negative for urgency, frequency, decreased urine volume and difficulty urinating.  Musculoskeletal: Negative for back pain, arthralgias and gait problem.  Skin: Negative for color change, pallor and rash.  Neurological: Negative for dizziness, light-headedness, numbness and headaches.  Hematological: Negative for adenopathy. Does not bruise/bleed easily.  Psychiatric/Behavioral: Negative for suicidal ideas, confusion, sleep disturbance, self-injury, dysphoric mood, decreased concentration and agitation.       Objective:    BP 130/74  Pulse 64  Temp(Src) 98.1 F (36.7 C) (Oral)  Ht 5' 3.75" (1.619 m)  Wt 129 lb 6.4 oz (58.695 kg)  BMI 22.39 kg/m2  SpO2 99% General appearance: alert, cooperative, appears stated age and no distress Head: Normocephalic, without obvious abnormality, atraumatic Eyes: conjunctivae/corneas clear. PERRL, EOM's intact. Fundi benign. Ears: normal TM's and external ear canals both ears Nose: Nares normal. Septum midline. Mucosa normal. No drainage or sinus tenderness. Throat: lips, mucosa, and tongue normal; teeth and gums normal Neck: no adenopathy, no  carotid bruit, no JVD, supple, symmetrical, trachea midline and thyroid not enlarged, symmetric, no tenderness/mass/nodules Back: symmetric, no curvature. ROM normal. No CVA tenderness. Lungs: clear to auscultation  bilaterally Breasts: normal appearance, no masses or tenderness Heart: S1, S2 normal Abdomen: soft, non-tender; bowel sounds normal; no masses,  no organomegaly Pelvic: deferred Extremities: extremities normal, atraumatic, no cyanosis or edema Pulses: 2+ and symmetric Skin: Skin color, texture, turgor normal. No rashes or lesions Lymph nodes: Cervical, supraclavicular, and axillary nodes normal. Neurologic: Alert and oriented X 3, normal strength and tone. Normal symmetric reflexes. Normal coordination and gait Psych--no anxiety, no depression      Assessment:    Healthy female exam.      Plan:    ghm utd Check labs See After Visit Summary for Counseling Recommendations   1. Environmental allergies  - Ambulatory referral to Allergy   2. Preventative health care  - Basic metabolic panel - Hepatic function panel - Lipid panel - POCT urinalysis dipstick - CBC with Differential - TSH  4. Essential hypertension Stable, con't meds - Basic metabolic panel - Hepatic function panel - Lipid panel - POCT urinalysis dipstick - CBC with Differential - TSH

## 2013-08-20 NOTE — Patient Instructions (Signed)
Preventive Care for Adults A healthy lifestyle and preventive care can promote health and wellness. Preventive health guidelines for women include the following key practices.  A routine yearly physical is a good way to check with your health care [name] about your health and preventive screening. It is a chance to share any concerns and updates on your health and to receive a thorough exam.  Visit your dentist for a routine exam and preventive care every 6 months. Brush your teeth twice a day and floss once a day. Good oral hygiene prevents tooth decay and gum disease.  The frequency of eye exams is based on your age, health, family medical history, use of contact lenses, and other factors. Follow your health care [name]'s recommendations for frequency of eye exams.  Eat a healthy diet. Foods like vegetables, fruits, whole grains, low-fat dairy products, and lean protein foods contain the nutrients you need without too many calories. Decrease your intake of foods high in solid fats, added sugars, and salt. Eat the right amount of calories for you.Get information about a proper diet from your health care [name], if necessary.  Regular physical exercise is one of the most important things you can do for your health. Most adults should get at least 150 minutes of moderate-intensity exercise (any activity that increases your heart rate and causes you to sweat) each week. In addition, most adults need muscle-strengthening exercises on 2 or more days a week.  Maintain a healthy weight. The body mass index (BMI) is a screening tool to identify possible weight problems. It provides an estimate of body fat based on height and weight. Your health care [name] can find your BMI, and can help you achieve or maintain a healthy weight.For adults 20 years and older:  A BMI below 18.5 is considered underweight.  A BMI of 18.5 to 24.9 is normal.  A BMI of 25 to 29.9 is considered overweight.  A BMI of  30 and above is considered obese.  Maintain normal blood lipids and cholesterol levels by exercising and minimizing your intake of saturated fat. Eat a balanced diet with plenty of fruit and vegetables. Blood tests for lipids and cholesterol should begin at age 52 and be repeated every 5 years. If your lipid or cholesterol levels are high, you are over 50, or you are at high risk for heart disease, you may need your cholesterol levels checked more frequently.Ongoing high lipid and cholesterol levels should be treated with medicines if diet and exercise are not working.  If you smoke, find out from your health care [name] how to quit. If you do not use tobacco, do not start.  Lung cancer screening is recommended for adults aged 37-80 years who are at high risk for developing lung cancer because of a history of smoking. A yearly low-dose CT scan of the lungs is recommended for people who have at least a 30-pack-year history of smoking and are a current smoker or have quit within the past 15 years. A pack year of smoking is smoking an average of 1 pack of cigarettes a day for 1 year (for example: 1 pack a day for 30 years or 2 packs a day for 15 years). Yearly screening should continue until the smoker has stopped smoking for at least 15 years. Yearly screening should be stopped for people who develop a health problem that would prevent them from having lung cancer treatment.  If you are pregnant, do not drink alcohol. If you are breastfeeding,  be very cautious about drinking alcohol. If you are not pregnant and choose to drink alcohol, do not have more than 1 drink per day. One drink is considered to be 12 ounces (355 mL) of beer, 5 ounces (148 mL) of wine, or 1.5 ounces (44 mL) of liquor.  Avoid use of street drugs. Do not share needles with anyone. Ask for help if you need support or instructions about stopping the use of drugs.  High blood pressure causes heart disease and increases the risk of  stroke. Your blood pressure should be checked at least every 1 to 2 years. Ongoing high blood pressure should be treated with medicines if weight loss and exercise do not work.  If you are 75-52 years old, ask your health care [name] if you should take aspirin to prevent strokes.  Diabetes screening involves taking a blood sample to check your fasting blood sugar level. This should be done once every 3 years, after age 15, if you are within normal weight and without risk factors for diabetes. Testing should be considered at a younger age or be carried out more frequently if you are overweight and have at least 1 risk factor for diabetes.  Breast cancer screening is essential preventive care for women. You should practice "breast self-awareness." This means understanding the normal appearance and feel of your breasts and may include breast self-examination. Any changes detected, no matter how small, should be reported to a health care [name]. Women in their 58s and 30s should have a clinical breast exam (CBE) by a health care [name] as part of a regular health exam every 1 to 3 years. After age 16, women should have a CBE every year. Starting at age 53, women should consider having a mammogram (breast X-ray test) every year. Women who have a family history of breast cancer should talk to their health care [name] about genetic screening. Women at a high risk of breast cancer should talk to their health care providers about having an MRI and a mammogram every year.  Breast cancer gene (BRCA)-related cancer risk assessment is recommended for women who have family members with BRCA-related cancers. BRCA-related cancers include breast, ovarian, tubal, and peritoneal cancers. Having family members with these cancers may be associated with an increased risk for harmful changes (mutations) in the breast cancer genes BRCA1 and BRCA2. Results of the assessment will determine the need for genetic counseling and  BRCA1 and BRCA2 testing.  Routine pelvic exams to screen for cancer are no longer recommended for nonpregnant women who are considered low risk for cancer of the pelvic organs (ovaries, uterus, and vagina) and who do not have symptoms. Ask your health care [name] if a screening pelvic exam is right for you.  If you have had past treatment for cervical cancer or a condition that could lead to cancer, you need Pap tests and screening for cancer for at least 20 years after your treatment. If Pap tests have been discontinued, your risk factors (such as having a new sexual partner) need to be reassessed to determine if screening should be resumed. Some women have medical problems that increase the chance of getting cervical cancer. In these cases, your health care [name] may recommend more frequent screening and Pap tests.  The HPV test is an additional test that may be used for cervical cancer screening. The HPV test looks for the virus that can cause the cell changes on the cervix. The cells collected during the Pap test can be  tested for HPV. The HPV test could be used to screen women aged 47 years and older, and should be used in women of any age who have unclear Pap test results. After the age of 36, women should have HPV testing at the same frequency as a Pap test.  Colorectal cancer can be detected and often prevented. Most routine colorectal cancer screening begins at the age of 38 years and continues through age 58 years. However, your health care [name] may recommend screening at an earlier age if you have risk factors for colon cancer. On a yearly basis, your health care [name] may provide home test kits to check for hidden blood in the stool. Use of a small camera at the end of a tube, to directly examine the colon (sigmoidoscopy or colonoscopy), can detect the earliest forms of colorectal cancer. Talk to your health care [name] about this at age 64, when routine screening begins. Direct  exam of the colon should be repeated every 5-10 years through age 21 years, unless Melissa forms of pre-cancerous polyps or small growths are found.  People who are at an increased risk for hepatitis B should be screened for this virus. You are considered at high risk for hepatitis B if:  You were born in a country where hepatitis B occurs often. Talk with your health care [name] about which countries are considered high risk.  Your parents were born in a high-risk country and you have not received a shot to protect against hepatitis B (hepatitis B vaccine).  You have HIV or AIDS.  You use needles to inject street drugs.  You live with, or have sex with, someone who has Hepatitis B.  You get hemodialysis treatment.  You take certain medicines for conditions like cancer, organ transplantation, and autoimmune conditions.  Hepatitis C blood testing is recommended for all people born from [birth year] through 1965 and any individual with known risks for hepatitis C.  Practice safe sex. Use condoms and avoid high-risk sexual practices to reduce the spread of sexually transmitted infections (STIs). STIs include gonorrhea, chlamydia, syphilis, trichomonas, herpes, HPV, and human immunodeficiency virus (HIV). Herpes, HIV, and HPV are viral illnesses that have no cure. They can result in disability, cancer, and death.  You should be screened for sexually transmitted illnesses (STIs) including gonorrhea and chlamydia if:  You are sexually active and are younger than 24 years.  You are older than 24 years and your health care [name] tells you that you are at risk for this type of infection.  Your sexual activity has changed since you were last screened and you are at an increased risk for chlamydia or gonorrhea. Ask your health care [name] if you are at risk.  If you are at risk of being infected with HIV, it is recommended that you take a prescription medicine daily to prevent HIV infection. This is  called preexposure prophylaxis (PrEP). You are considered at risk if:  You are a heterosexual woman, are sexually active, and are at increased risk for HIV infection.  You take drugs by injection.  You are sexually active with a partner who has HIV.  Talk with your health care [name] about whether you are at high risk of being infected with HIV. If you choose to begin PrEP, you should first be tested for HIV. You should then be tested every 3 months for as long as you are taking PrEP.  Osteoporosis is a disease in which the bones lose minerals and strength  with aging. This can result in serious bone fractures or breaks. The risk of osteoporosis can be identified using a bone density scan. Women ages 65 years and over and women at risk for fractures or osteoporosis should discuss screening with their health care providers. Ask your health care Ericah Scotto whether you should take a calcium supplement or vitamin D to reduce the rate of osteoporosis.  Menopause can be associated with physical symptoms and risks. Hormone replacement therapy is available to decrease symptoms and risks. You should talk to your health care Nadav Swindell about whether hormone replacement therapy is right for you.  Use sunscreen. Apply sunscreen liberally and repeatedly throughout the day. You should seek shade when your shadow is shorter than you. Protect yourself by wearing long sleeves, pants, a wide-brimmed hat, and sunglasses year round, whenever you are outdoors.  Once a month, do a whole body skin exam, using a mirror to look at the skin on your back. Tell your health care Cletis Muma of new moles, moles that have irregular borders, moles that are larger than a pencil eraser, or moles that have changed in shape or color.  Stay current with required vaccines (immunizations).  Influenza vaccine. All adults should be immunized every year.  Tetanus, diphtheria, and acellular pertussis (Td, Tdap) vaccine. Pregnant women should  receive 1 dose of Tdap vaccine during each pregnancy. The dose should be obtained regardless of the length of time since the last dose. Immunization is preferred during the 27th-36th week of gestation. An adult who has not previously received Tdap or who does not know her vaccine status should receive 1 dose of Tdap. This initial dose should be followed by tetanus and diphtheria toxoids (Td) booster doses every 10 years. Adults with an unknown or incomplete history of completing a 3-dose immunization series with Td-containing vaccines should begin or complete a primary immunization series including a Tdap dose. Adults should receive a Td booster every 10 years.  Varicella vaccine. An adult without evidence of immunity to varicella should receive 2 doses or a second dose if she has previously received 1 dose. Pregnant females who do not have evidence of immunity should receive the first dose after pregnancy. This first dose should be obtained before leaving the health care facility. The second dose should be obtained 4-8 weeks after the first dose.  Human papillomavirus (HPV) vaccine. Females aged 13-26 years who have not received the vaccine previously should obtain the 3-dose series. The vaccine is not recommended for use in pregnant females. However, pregnancy testing is not needed before receiving a dose. If a female is found to be pregnant after receiving a dose, no treatment is needed. In that case, the remaining doses should be delayed until after the pregnancy. Immunization is recommended for any person with an immunocompromised condition through the age of 26 years if she did not get any or all doses earlier. During the 3-dose series, the second dose should be obtained 4-8 weeks after the first dose. The third dose should be obtained 24 weeks after the first dose and 16 weeks after the second dose.  Zoster vaccine. One dose is recommended for adults aged 60 years or older unless certain conditions are  present.  Measles, mumps, and rubella (MMR) vaccine. Adults born before 1957 generally are considered immune to measles and mumps. Adults born in 1957 or later should have 1 or more doses of MMR vaccine unless there is a contraindication to the vaccine or there is laboratory evidence of immunity to   each of the three diseases. A routine second dose of MMR vaccine should be obtained at least 28 days after the first dose for students attending postsecondary schools, health care workers, or international travelers. People who received inactivated measles vaccine or an unknown type of measles vaccine during 1963-1967 should receive 2 doses of MMR vaccine. People who received inactivated mumps vaccine or an unknown type of mumps vaccine before 1979 and are at high risk for mumps infection should consider immunization with 2 doses of MMR vaccine. For females of childbearing age, rubella immunity should be determined. If there is no evidence of immunity, females who are not pregnant should be vaccinated. If there is no evidence of immunity, females who are pregnant should delay immunization until after pregnancy. Unvaccinated health care workers born before 1957 who lack laboratory evidence of measles, mumps, or rubella immunity or laboratory confirmation of disease should consider measles and mumps immunization with 2 doses of MMR vaccine or rubella immunization with 1 dose of MMR vaccine.  Pneumococcal 13-valent conjugate (PCV13) vaccine. When indicated, a person who is uncertain of her immunization history and has no record of immunization should receive the PCV13 vaccine. An adult aged 19 years or older who has certain medical conditions and has not been previously immunized should receive 1 dose of PCV13 vaccine. This PCV13 should be followed with a dose of pneumococcal polysaccharide (PPSV23) vaccine. The PPSV23 vaccine dose should be obtained at least 8 weeks after the dose of PCV13 vaccine. An adult aged 19  years or older who has certain medical conditions and previously received 1 or more doses of PPSV23 vaccine should receive 1 dose of PCV13. The PCV13 vaccine dose should be obtained 1 or more years after the last PPSV23 vaccine dose.  Pneumococcal polysaccharide (PPSV23) vaccine. When PCV13 is also indicated, PCV13 should be obtained first. All adults aged 65 years and older should be immunized. An adult younger than age 65 years who has certain medical conditions should be immunized. Any person who resides in a nursing home or long-term care facility should be immunized. An adult smoker should be immunized. People with an immunocompromised condition and certain other conditions should receive both PCV13 and PPSV23 vaccines. People with human immunodeficiency virus (HIV) infection should be immunized as soon as possible after diagnosis. Immunization during chemotherapy or radiation therapy should be avoided. Routine use of PPSV23 vaccine is not recommended for American Indians, [place] Natives, or people younger than 65 years unless there are medical conditions that require PPSV23 vaccine. When indicated, people who have unknown immunization and have no record of immunization should receive PPSV23 vaccine. One-time revaccination 5 years after the first dose of PPSV23 is recommended for people aged 19-64 years who have chronic kidney failure, nephrotic syndrome, asplenia, or immunocompromised conditions. People who received 1-2 doses of PPSV23 before age 65 years should receive another dose of PPSV23 vaccine at age 65 years or later if at least 5 years have passed since the previous dose. Doses of PPSV23 are not needed for people immunized with PPSV23 at or after age 65 years.  Meningococcal vaccine. Adults with asplenia or persistent complement component deficiencies should receive 2 doses of quadrivalent meningococcal conjugate (MenACWY-D) vaccine. The doses should be obtained at least 2 months apart.  Microbiologists working with certain meningococcal bacteria, military recruits, people at risk during an outbreak, and people who travel to or live in countries with a high rate of meningitis should be immunized. A first-year college student up through age   21 years who is living in a residence hall should receive a dose if she did not receive a dose on or after her 16th birthday. Adults who have certain high-risk conditions should receive one or more doses of vaccine.  Hepatitis A vaccine. Adults who wish to be protected from this disease, have certain high-risk conditions, work with hepatitis A-infected animals, work in hepatitis A research labs, or travel to or work in countries with a high rate of hepatitis A should be immunized. Adults who were previously unvaccinated and who anticipate close contact with an international adoptee during the first 60 days after arrival in the [place] from a country with a high rate of hepatitis A should be immunized.  Hepatitis B vaccine. Adults who wish to be protected from this disease, have certain high-risk conditions, may be exposed to blood or other infectious body fluids, are household contacts or sex partners of hepatitis B positive people, are clients or workers in certain care facilities, or travel to or work in countries with a high rate of hepatitis B should be immunized.  Haemophilus influenzae type b (Hib) vaccine. A previously unvaccinated person with asplenia or sickle cell disease or having a scheduled splenectomy should receive 1 dose of Hib vaccine. Regardless of previous immunization, a recipient of a hematopoietic stem cell transplant should receive a 3-dose series 6-12 months after her successful transplant. Hib vaccine is not recommended for adults with HIV infection. Preventive Services / Frequency Ages 43 to 39years  Blood pressure check.** / Every 1 to 2 years.  Lipid and cholesterol check.** / Every 5 years beginning at age  75.  Clinical breast exam.** / Every 3 years for women in their 32s and 74s.  BRCA-related cancer risk assessment.** / For women who have family members with a BRCA-related cancer (breast, ovarian, tubal, or peritoneal cancers).  Pap test.** / Every 2 years from ages 65 through 91. Every 3 years starting at age 34 through age 93 or 72 with a history of 3 consecutive normal Pap tests.  HPV screening.** / Every 3 years from ages 46 through ages 53 to 26 with a history of 3 consecutive normal Pap tests.  Hepatitis C blood test.** / For any individual with known risks for hepatitis C.  Skin self-exam. / Monthly.  Influenza vaccine. / Every year.  Tetanus, diphtheria, and acellular pertussis (Tdap, Td) vaccine.** / Consult your health care Everlene Cunning. Pregnant women should receive 1 dose of Tdap vaccine during each pregnancy. 1 dose of Td every 10 years.  Varicella vaccine.** / Consult your health care Kaion Tisdale. Pregnant females who do not have evidence of immunity should receive the first dose after pregnancy.  HPV vaccine. / 3 doses over 6 months, if 70 and younger. The vaccine is not recommended for use in pregnant females. However, pregnancy testing is not needed before receiving a dose.  Measles, mumps, rubella (MMR) vaccine.** / You need at least 1 dose of MMR if you were born in 1957 or later. You may also need a 2nd dose. For females of childbearing age, rubella immunity should be determined. If there is no evidence of immunity, females who are not pregnant should be vaccinated. If there is no evidence of immunity, females who are pregnant should delay immunization until after pregnancy.  Pneumococcal 13-valent conjugate (PCV13) vaccine.** / Consult your health care Kely Dohn.  Pneumococcal polysaccharide (PPSV23) vaccine.** / 1 to 2 doses if you smoke cigarettes or if you have certain conditions.  Meningococcal vaccine.** /  1 dose if you are age 70 to 51 years and a [place]er living in a residence hall, or have one of several medical conditions, you need to get vaccinated against meningococcal disease. You may also need additional booster doses.  Hepatitis A vaccine.** / Consult your health care Ren Grasse.  Hepatitis B vaccine.** / Consult your health care Xyler Terpening.  Haemophilus influenzae type b (Hib) vaccine.** / Consult your health care Kemaya Dorner. Ages 40 to 64years  Blood pressure check.** / Every 1 to 2 years.  Lipid and cholesterol check.** / Every 5 years beginning at age 58 years.  Lung cancer screening. / Every year if you are aged 56-80 years and have a 30-pack-year history of smoking and currently smoke or have quit within the past 15 years. Yearly screening is stopped once you have quit smoking for at least 15 years or develop a health problem that would prevent you from having lung cancer treatment.  Clinical breast exam.** / Every year after age 35 years.  BRCA-related cancer risk assessment.** / For women who have family members with a BRCA-related cancer (breast, ovarian, tubal, or peritoneal cancers).  Mammogram.** / Every year beginning at age 109 years and continuing for as long as you are in good health. Consult with your health care Jaylani Mcguinn.  Pap test.** / Every 3 years starting at age 44 years through age 94 or 70 years with a history of 3 consecutive normal Pap tests.  HPV screening.** / Every 3 years from ages 109 years through ages 50 to 30 years with a history of 3 consecutive normal Pap tests.  Fecal occult blood test (FOBT) of stool. / Every year beginning at age 73 years and continuing until age 59 years. You may not need to do this test if you get a colonoscopy every 10 years.  Flexible sigmoidoscopy or colonoscopy.** / Every 5 years for a flexible sigmoidoscopy or every 10 years for a colonoscopy beginning at age 68 years and continuing until age 12 years.  Hepatitis C blood test.** / For all people born from [birth year] through  1967 and any individual with known risks for hepatitis C.  Skin self-exam. / Monthly.  Influenza vaccine. / Every year.  Tetanus, diphtheria, and acellular pertussis (Tdap/Td) vaccine.** / Consult your health care Anajah Sterbenz. Pregnant women should receive 1 dose of Tdap vaccine during each pregnancy. 1 dose of Td every 10 years.  Varicella vaccine.** / Consult your health care Omah Dewalt. Pregnant females who do not have evidence of immunity should receive the first dose after pregnancy.  Zoster vaccine.** / 1 dose for adults aged 2 years or older.  Measles, mumps, rubella (MMR) vaccine.** / You need at least 1 dose of MMR if you were born in 1959 or later. You may also need a 2nd dose. For females of childbearing age, rubella immunity should be determined. If there is no evidence of immunity, females who are not pregnant should be vaccinated. If there is no evidence of immunity, females who are pregnant should delay immunization until after pregnancy.  Pneumococcal 13-valent conjugate (PCV13) vaccine.** / Consult your health care Cay Kath.  Pneumococcal polysaccharide (PPSV23) vaccine.** / 1 to 2 doses if you smoke cigarettes or if you have certain conditions.  Meningococcal vaccine.** / Consult your health care Daevion Navarette.  Hepatitis A vaccine.** / Consult your health care Youcef Klas.  Hepatitis B vaccine.** / Consult your health care Martino Tompson.  Haemophilus influenzae type b (Hib) vaccine.** / Consult your health care Neelie Welshans. Ages 48 years  and over  Blood pressure check.** / Every 1 to 2 years.  Lipid and cholesterol check.** / Every 5 years beginning at age 84 years.  Lung cancer screening. / Every year if you are aged 50-80 years and have a 30-pack-year history of smoking and currently smoke or have quit within the past 15 years. Yearly screening is stopped once you have quit smoking for at least 15 years or develop a health problem that would prevent you from having lung cancer  treatment.  Clinical breast exam.** / Every year after age 24 years.  BRCA-related cancer risk assessment.** / For women who have family members with a BRCA-related cancer (breast, ovarian, tubal, or peritoneal cancers).  Mammogram.** / Every year beginning at age 14 years and continuing for as long as you are in good health. Consult with your health care Ahonesty Woodfin.  Pap test.** / Every 3 years starting at age 17 years through age 31 or 74 years with 3 consecutive normal Pap tests. Testing can be stopped between 65 and 70 years with 3 consecutive normal Pap tests and no abnormal Pap or HPV tests in the past 10 years.  HPV screening.** / Every 3 years from ages 30 years through ages 70 or 28 years with a history of 3 consecutive normal Pap tests. Testing can be stopped between 65 and 70 years with 3 consecutive normal Pap tests and no abnormal Pap or HPV tests in the past 10 years.  Fecal occult blood test (FOBT) of stool. / Every year beginning at age 64 years and continuing until age 92 years. You may not need to do this test if you get a colonoscopy every 10 years.  Flexible sigmoidoscopy or colonoscopy.** / Every 5 years for a flexible sigmoidoscopy or every 10 years for a colonoscopy beginning at age 73 years and continuing until age 39 years.  Hepatitis C blood test.** / For all people born from [birth year] through 1967 and any individual with known risks for hepatitis C.  Osteoporosis screening.** / A one-time screening for women ages 35 years and over and women at risk for fractures or osteoporosis.  Skin self-exam. / Monthly.  Influenza vaccine. / Every year.  Tetanus, diphtheria, and acellular pertussis (Tdap/Td) vaccine.** / 1 dose of Td every 10 years.  Varicella vaccine.** / Consult your health care Onnie Hatchel.  Zoster vaccine.** / 1 dose for adults aged 59 years or older.  Pneumococcal 13-valent conjugate (PCV13) vaccine.** / Consult your health care Selisa Tensley.  Pneumococcal  polysaccharide (PPSV23) vaccine.** / 1 dose for all adults aged 8 years and older.  Meningococcal vaccine.** / Consult your health care Bayle Calvo.  Hepatitis A vaccine.** / Consult your health care Shaquavia Whisonant.  Hepatitis B vaccine.** / Consult your health care Deaun Rocha.  Haemophilus influenzae type b (Hib) vaccine.** / Consult your health care Malik Paar. ** Family history and personal history of risk and conditions may change your health care Kada Friesen's recommendations. Document Released: 04/06/2001 Document Revised: 02/13/2013 Document Reviewed: 07/06/2010 [facility] Patient Information 2015 [place], [place]. This information is not intended to replace advice given to you by your health care Tytionna Cloyd. Make sure you discuss any questions you have with your health care Jackelynn Hosie.

## 2013-08-21 ENCOUNTER — Telehealth: Payer: Self-pay | Admitting: Family Medicine

## 2013-08-21 LAB — POCT URINALYSIS DIPSTICK
BILIRUBIN UA: NEGATIVE
GLUCOSE UA: NEGATIVE
KETONES UA: NEGATIVE
Leukocytes, UA: NEGATIVE
NITRITE UA: NEGATIVE
Protein, UA: NEGATIVE
RBC UA: NEGATIVE
Spec Grav, UA: <=1.005
Urobilinogen, UA: 0.2
pH, UA: 5

## 2013-08-21 NOTE — Telephone Encounter (Signed)
Relevant patient education mailed to patient.  

## 2013-08-31 ENCOUNTER — Encounter: Payer: Self-pay | Admitting: Family Medicine

## 2013-12-27 ENCOUNTER — Encounter: Payer: Self-pay | Admitting: Family Medicine

## 2013-12-27 ENCOUNTER — Ambulatory Visit (INDEPENDENT_AMBULATORY_CARE_PROVIDER_SITE_OTHER): Payer: BC Managed Care – PPO | Admitting: Family Medicine

## 2013-12-27 VITALS — BP 134/79 | HR 63 | Temp 98.5°F | Wt 129.0 lb

## 2013-12-27 DIAGNOSIS — R350 Frequency of micturition: Secondary | ICD-10-CM

## 2013-12-27 LAB — POCT URINALYSIS DIPSTICK
Bilirubin, UA: NEGATIVE
Blood, UA: NEGATIVE
Glucose, UA: NEGATIVE
KETONES UA: NEGATIVE
Leukocytes, UA: NEGATIVE
Nitrite, UA: NEGATIVE
Protein, UA: NEGATIVE
Spec Grav, UA: 1.015
Urobilinogen, UA: 4
pH, UA: 8

## 2013-12-27 MED ORDER — SOLIFENACIN SUCCINATE 5 MG PO TABS: 5.0000 mg | ORAL_TABLET | Freq: Every day | ORAL | Status: DC

## 2013-12-27 NOTE — Patient Instructions (Signed)
Urinary Incontinence Urinary incontinence is the involuntary loss of urine from your bladder. CAUSES  There are many causes of urinary incontinence. They include:  Medicines.  Infections.  Prostatic enlargement, leading to overflow of urine from your bladder.  Surgery.  Neurological diseases.  Emotional factors. SIGNS AND SYMPTOMS Urinary Incontinence can be divided into four types: 1. Urge incontinence. Urge incontinence is the involuntary loss of urine before you have the opportunity to go to the bathroom. There is a sudden urge to void but not enough time to reach a bathroom. 2. Stress incontinence. Stress incontinence is the sudden loss of urine with any activity that forces urine to pass. It is commonly caused by anatomical changes to the pelvis and sphincter areas of your body. 3. Overflow incontinence. Overflow incontinence is the loss of urine from an obstructed opening to your bladder. This results in a backup of urine and a resultant buildup of pressure within the bladder. When the pressure within the bladder exceeds the closing pressure of the sphincter, the urine overflows, which causes incontinence, similar to water overflowing a dam. 4. Total incontinence. Total incontinence is the loss of urine as a result of the inability to store urine within your bladder. DIAGNOSIS  Evaluating the cause of incontinence may require:  A thorough and complete medical and obstetric history.  A complete physical exam.  Laboratory tests such as a urine culture and sensitivities. When additional tests are indicated, they can include:  An ultrasound exam.  Kidney and bladder X-rays.  Cystoscopy. This is an exam of the bladder using a narrow scope.  Urodynamic testing to test the nerve function to the bladder and sphincter areas. TREATMENT  Treatment for urinary incontinence depends on the cause:  For urge incontinence caused by a bacterial infection, antibiotics will be prescribed.  If the urge incontinence is related to medicines you take, your health care provider may have you change the medicine.  For stress incontinence, surgery to re-establish anatomical support to the bladder or sphincter, or both, will often correct the condition.  For overflow incontinence caused by an enlarged prostate, an operation to open the channel through the enlarged prostate will allow the flow of urine out of the bladder. In women with fibroids, a hysterectomy may be recommended.  For total incontinence, surgery on your urinary sphincter may help. An artificial urinary sphincter (an inflatable cuff placed around the urethra) may be required. In women who have developed a hole-like passage between their bladder and vagina (vesicovaginal fistula), surgery to close the fistula often is required. HOME CARE INSTRUCTIONS  Normal daily hygiene and the use of pads or adult diapers that are changed regularly will help prevent odors and skin damage.  Avoid caffeine. It can overstimulate your bladder.  Use the bathroom regularly. Try about every 2-3 hours to go to the bathroom, even if you do not feel the need to do so. Take time to empty your bladder completely. After urinating, wait a minute. Then try to urinate again.  For causes involving nerve dysfunction, keep a log of the medicines you take and a journal of the times you go to the bathroom. SEEK MEDICAL CARE IF:  You experience worsening of pain instead of improvement in pain after your procedure.  Your incontinence becomes worse instead of better. SEE IMMEDIATE MEDICAL CARE IF:  You experience fever or shaking chills.  You are unable to pass your urine.  You have redness spreading into your groin or down into your thighs. MAKE SURE   YOU:   Understand these instructions.   Will watch your condition.  Will get help right away if you are not doing well or get worse. Document Released: 03/18/2004 Document Revised: 11/29/2012 Document  Reviewed: 07/18/2012 [employer] Patient Information 2015 [employer]. This information is not intended to replace advice given to you by your health care provider. Make sure you discuss any questions you have with your health care provider.  

## 2013-12-27 NOTE — Progress Notes (Signed)
  Subjective:    [name] is a 59 y.o. female who complains of frequency and incontinence. She has had symptoms for 5 days. Patient also complains of no other symptoms. Patient denies back pain, congestion, cough, fever, headache, rhinitis, sorethroat, stomach ache and vaginal discharge. Patient does not have a history of recurrent UTI. Patient does not have a history of pyelonephritis.   The following portions of the patient's history were reviewed and updated as appropriate: allergies, current medications, past family history, past medical history, past social history, past surgical history and problem list.  Review of Systems Pertinent items are noted in HPI.    Objective:    BP 134/79 mmHg  Pulse 63  Temp(Src) 98.5 F (36.9 C) (Oral)  Wt 129 lb (58.514 kg)  SpO2 99% General appearance: alert, cooperative and appears stated age Neurologic: Grossly normal  Laboratory:  Urine dipstick: negative for all components.   Micro exam: not done.    Assessment:    stress incontinenc     Plan:    Maintain adequate hydration. Follow up if symptoms not improving, and as needed.   [facility] if no relief

## 2013-12-27 NOTE — Progress Notes (Signed)
Pre visit review using our clinic review tool, if applicable. No additional management support is needed unless otherwise documented below in the visit note. 

## 2014-02-21 ENCOUNTER — Ambulatory Visit (INDEPENDENT_AMBULATORY_CARE_PROVIDER_SITE_OTHER): Payer: BC Managed Care – PPO | Admitting: Family Medicine

## 2014-02-21 ENCOUNTER — Encounter: Payer: Self-pay | Admitting: Family Medicine

## 2014-02-21 VITALS — BP 126/80 | HR 66 | Temp 98.9°F | Wt 129.8 lb

## 2014-02-21 DIAGNOSIS — E785 Hyperlipidemia, unspecified: Secondary | ICD-10-CM

## 2014-02-21 DIAGNOSIS — I10 Essential (primary) hypertension: Secondary | ICD-10-CM

## 2014-02-21 LAB — HEPATIC FUNCTION PANEL
ALK PHOS: 96 U/L (ref 39–117)
ALT: 17 U/L (ref 0–35)
AST: 18 U/L (ref 0–37)
Albumin: 4.3 g/dL (ref 3.5–5.2)
BILIRUBIN TOTAL: 0.7 mg/dL (ref 0.2–1.2)
Bilirubin, Direct: 0.1 mg/dL (ref 0.0–0.3)
Total Protein: 7.2 g/dL (ref 6.0–8.3)

## 2014-02-21 LAB — BASIC METABOLIC PANEL
BUN: 10 mg/dL (ref 6–23)
CO2: 30 mEq/L (ref 19–32)
Calcium: 9.1 mg/dL (ref 8.4–10.5)
Chloride: 101 mEq/L (ref 96–112)
Creatinine, Ser: 0.6 mg/dL (ref 0.4–1.2)
GFR: 100.3 mL/min (ref >60.00)
Glucose, Bld: 97 mg/dL (ref 70–99)
POTASSIUM: 3.6 meq/L (ref 3.5–5.1)
SODIUM: 136 meq/L (ref 135–145)

## 2014-02-21 LAB — LIPID PANEL
CHOLESTEROL: 221 mg/dL — AB (ref 0–200)
HDL: 60.7 mg/dL (ref >39.00)
LDL Cholesterol: 144 mg/dL — ABNORMAL HIGH (ref 0–99)
NonHDL: 160.3
TRIGLYCERIDES: 80 mg/dL (ref 0.0–149.0)
Total CHOL/HDL Ratio: 4
VLDL: 16 mg/dL (ref 0.0–40.0)

## 2014-02-21 LAB — CBC WITH DIFFERENTIAL/PLATELET
BASOS ABS: 0 10*3/uL (ref 0.0–0.1)
Basophils Relative: 0.7 % (ref 0.0–3.0)
Eosinophils Absolute: 0.1 10*3/uL (ref 0.0–0.7)
Eosinophils Relative: 3.2 % (ref 0.0–5.0)
HEMATOCRIT: 39.5 % (ref 36.0–46.0)
HEMOGLOBIN: 13.5 g/dL (ref 12.0–15.0)
LYMPHS ABS: 1.6 10*3/uL (ref 0.7–4.0)
Lymphocytes Relative: 35.7 % (ref 12.0–46.0)
MCHC: 34.2 g/dL (ref 30.0–36.0)
MCV: 85.9 fl (ref 78.0–100.0)
MONO ABS: 0.3 10*3/uL (ref 0.1–1.0)
MONOS PCT: 6.4 % (ref 3.0–12.0)
NEUTROS PCT: 54 % (ref 43.0–77.0)
Neutro Abs: 2.4 10*3/uL (ref 1.4–7.7)
Platelets: 220 10*3/uL (ref 150.0–400.0)
RBC: 4.59 Mil/uL (ref 3.87–5.11)
RDW: 12.3 % (ref 11.5–15.5)
WBC: 4.5 10*3/uL (ref 4.0–10.5)

## 2014-02-21 LAB — POCT URINALYSIS DIPSTICK
Bilirubin, UA: NEGATIVE
Blood, UA: NEGATIVE
GLUCOSE UA: NEGATIVE
Ketones, UA: NEGATIVE
Leukocytes, UA: NEGATIVE
NITRITE UA: NEGATIVE
PH UA: 7.5
Protein, UA: NEGATIVE
Spec Grav, UA: 1.015
Urobilinogen, UA: 2

## 2014-02-21 NOTE — Progress Notes (Signed)
Pre visit review using our clinic review tool, if applicable. No additional management support is needed unless otherwise documented below in the visit note. 

## 2014-02-21 NOTE — Progress Notes (Signed)
    Subjective:     HPI: [name] is a 58 y.o. female here for follow up of dyslipidemia. The patient does not use medications that may worsen dyslipidemias (corticosteroids, progestins, anabolic steroids, diuretics, beta-blockers, amiodarone, cyclosporine, olanzapine). The patient exercises frequently. The patient is not known to have coexisting coronary artery disease.    Review of Systems: Review of Systems - History obtained from the patient General ROS: negative for - fatigue, night sweats, sleep disturbance, weight gain or weight loss Respiratory ROS: no cough, shortness of breath, or wheezing Cardiovascular ROS: no chest pain or dyspnea on exertion Musculoskeletal ROS: negative for - gait disturbance, joint pain, joint stiffness, joint swelling, muscle pain or muscular weakness      Objective:    Filed Vitals:   02/21/14 1019  BP: 126/80  Pulse: 66  Temp: 98.9 F (37.2 C)    Physical Exam: General Appearance:  awake, alert, oriented, in no acute distress Neck:  neck- supple, no mass, non-tender Lungs:  Normal expansion.  Clear to auscultation.  No rales, rhonchi, or wheezing. Heart:  Heart sounds are normal.  Regular rate and rhythm without murmur, gallop or rub. Extremities: Extremities warm to touch, pink, with no edema.   Lab Review Lab Results  Component Value Date   CHOL 199 08/20/2013   CHOL 193 08/17/2012   CHOL 152 07/14/2011   HDL 63.40 08/20/2013   HDL 65.40 08/17/2012   HDL 53.60 07/14/2011   LDLDIRECT 111.7 05/07/2010       Assessment:     Problem List Items Addressed This Visit    None    Visit Diagnoses    Essential hypertension    -  Primary    Relevant Orders       Basic metabolic panel       CBC with Differential       Hepatic function panel       Lipid panel       Microalbumin / creatinine urine ratio       POCT urinalysis dipstick    Hyperlipidemia        Relevant Orders       Basic metabolic panel       CBC with  Differential       Hepatic function panel       Lipid panel       Microalbumin / creatinine urine ratio       POCT urinalysis dipstick           Plan:    The following changes are planned for the next 6 months, at which time the patient will return for repeat fasting lipids:  1. Dietary recommendations: Mediterranean diet guidelines Reduce saturated fat, "trans" monounsaturated fatty acids, and cholesterol 2. Exercise recommendations:  An average 40 minutes of moderate to vigorous-intensity aerobic activity 3 or 4 times per week 3. Other treatment: None  Return in about 6 months (around 08/22/2014), or if symptoms worsen or fail to improve.    1. Essential hypertension Stable, con' t diovan and spironolactone - Basic metabolic panel - CBC with Differential - Hepatic function panel - Lipid panel - Microalbumin / creatinine urine ratio - POCT urinalysis dipstick  2. Hyperlipidemia Check labs - Basic metabolic panel - CBC with Differential - Hepatic function panel - Lipid panel - Microalbumin / creatinine urine ratio - POCT urinalysis dipstick

## 2014-02-21 NOTE — Patient Instructions (Signed)

## 2014-02-22 LAB — MICROALBUMIN / CREATININE URINE RATIO
Creatinine, Urine: 55.9 mg/dL
Microalb Creat Ratio: 3.6 mg/g (ref 0.0–30.0)
Microalb, Ur: 0.2 mg/dL (ref <2.0)

## 2014-04-24 ENCOUNTER — Telehealth: Payer: Self-pay | Admitting: Family Medicine

## 2014-04-24 DIAGNOSIS — E2839 Other primary ovarian failure: Secondary | ICD-10-CM

## 2014-04-24 NOTE — Telephone Encounter (Signed)
She has scheduled her bone density for [facility] [place]  Please put in the order

## 2014-04-25 NOTE — Telephone Encounter (Signed)
Order faxed to [facility].      KP

## 2014-05-02 ENCOUNTER — Encounter: Payer: Self-pay | Admitting: Gynecology

## 2014-05-02 ENCOUNTER — Ambulatory Visit (INDEPENDENT_AMBULATORY_CARE_PROVIDER_SITE_OTHER): Payer: BC Managed Care – PPO | Admitting: Gynecology

## 2014-05-02 VITALS — BP 130/80 | Ht 63.0 in | Wt 127.0 lb

## 2014-05-02 DIAGNOSIS — N950 Postmenopausal bleeding: Secondary | ICD-10-CM

## 2014-05-02 DIAGNOSIS — N952 Postmenopausal atrophic vaginitis: Secondary | ICD-10-CM

## 2014-05-02 NOTE — Patient Instructions (Signed)
Follow up for ultrasound as scheduled 

## 2014-05-02 NOTE — Progress Notes (Signed)
[name] [birth year] [id number]        59 y.o.  [id number] New patient who presents complaining of 5 separate episodes of dark brown vaginal staining since the end of February. Preceding this she noted feeling bloated with pelvic pressure like she would feel before her menses. Last menstrual period/last bleeding episode mid 8s with no bleeding since then. Actively sees Dr. Etter [name] who does her routine healthcare/breast and pelvic exam with last Pap smear/HPV negative 07/2012.  No other symptoms to include breast complaints or constitutional symptoms. No change in medications.  Past medical history,surgical history, problem list, medications, allergies, family history and social history were all reviewed and documented in the EPIC chart.  Directed ROS with pertinent positives and negatives documented in the history of present illness/assessment and plan.  Exam: [name] assistant Filed Vitals:   05/02/14 0931  BP: 130/80  Height: 5\' 3"  (1.6 m)  Weight: 127 lb (57.607 kg)   General appearance:  Normal Abdomen soft nontender without masses guarding rebound Pelvic external BUS vagina with atrophic changes. Cervix atrophic. Uterus axial to anteverted, normal size,  midline mobile nontender. Adnexa without masses or tenderness. Rectovaginal exam is normal.  Assessment/Plan:  59 y.o. [id number] with postmenopausal bleeding over the past month with several episodes of dark brown staining. Not bleeding today. Reviewed differential to include atrophic changes, hyperplastic changes, polyps, submucous myomas and uterine cancer. Recommend following up with sonohysterogram for pelvic surveillance, endometrial assessment and potential for endometrial biopsy. Patient agrees and will schedule appointment. She knows the importance of follow up.     Melissa Auerbach MD, 10:13 AM 05/02/2014

## 2014-05-06 ENCOUNTER — Other Ambulatory Visit: Payer: Self-pay | Admitting: Gynecology

## 2014-05-06 DIAGNOSIS — N950 Postmenopausal bleeding: Secondary | ICD-10-CM

## 2014-05-22 ENCOUNTER — Other Ambulatory Visit: Payer: Self-pay | Admitting: Gynecology

## 2014-05-22 ENCOUNTER — Encounter: Payer: Self-pay | Admitting: Gynecology

## 2014-05-22 ENCOUNTER — Other Ambulatory Visit (HOSPITAL_COMMUNITY)
Admission: RE | Admit: 2014-05-22 | Discharge: 2014-05-22 | Disposition: A | Discharge status: Home / Self Care | Payer: BC Managed Care – PPO | Source: Ambulatory Visit | Attending: Gynecology | Admitting: Gynecology

## 2014-05-22 ENCOUNTER — Ambulatory Visit (INDEPENDENT_AMBULATORY_CARE_PROVIDER_SITE_OTHER): Payer: BC Managed Care – PPO | Admitting: Gynecology

## 2014-05-22 ENCOUNTER — Ambulatory Visit (INDEPENDENT_AMBULATORY_CARE_PROVIDER_SITE_OTHER): Payer: BC Managed Care – PPO

## 2014-05-22 VITALS — BP 120/70

## 2014-05-22 DIAGNOSIS — N8331 Acquired atrophy of ovary: Secondary | ICD-10-CM

## 2014-05-22 DIAGNOSIS — N950 Postmenopausal bleeding: Secondary | ICD-10-CM

## 2014-05-22 DIAGNOSIS — Z01419 Encounter for gynecological examination (general) (routine) without abnormal findings: Secondary | ICD-10-CM | POA: Diagnosis present

## 2014-05-22 DIAGNOSIS — N952 Postmenopausal atrophic vaginitis: Secondary | ICD-10-CM

## 2014-05-22 DIAGNOSIS — N83319 Acquired atrophy of ovary, unspecified side: Secondary | ICD-10-CM

## 2014-05-22 DIAGNOSIS — N841 Polyp of cervix uteri: Secondary | ICD-10-CM

## 2014-05-22 DIAGNOSIS — D251 Intramural leiomyoma of uterus: Secondary | ICD-10-CM

## 2014-05-22 DIAGNOSIS — Z124 Encounter for screening for malignant neoplasm of cervix: Secondary | ICD-10-CM

## 2014-05-22 NOTE — Patient Instructions (Signed)
Office will call you with biopsy results. Call if vaginal staining continues.

## 2014-05-22 NOTE — Progress Notes (Signed)
[name] [birth year] [id number]        59 y.o.  [id number] presents for senna histogram due to recurrent vaginal staining postmenopausal.  Past medical history,surgical history, problem list, medications, allergies, family history and social history were all reviewed and documented in the EPIC chart.  Directed ROS with pertinent positives and negatives documented in the history of present illness/assessment and plan.  Exam: [place]/[name] assistant Filed Vitals:   05/22/14 1122  BP: 120/70   General appearance:  Normal External BUS vagina normal with atrophic changes. Cervix initially normal with atrophic changes. Pap smear done. With withdrawal of brush small endocervical polyp extruded at the external os. Uterus grossly normal midline mobile nontender. Adnexa without masses or tenderness  Ultrasound shows uterus normal size. 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 Endometrial echo 2.5 mm. Right and left ovaries atrophic/normal. Cul-de-sac negative.  Procedure:  Sonohysterogram performed, sterile technique, easy catheter introduction, good distention with no abnormalities. Endometrial sample taken. Patient tolerated well.  Cervix was visualized with a speculum and the endocervical polyp was biopsied off with a biopsy instrument in several passes. Silver nitrate hemostasis applied afterwards.  Assessment/Plan:  59 y.o. [id number] with postmenopausal staining. Ultrasound shows several small myomas but no intracavitary abnormalities. Pelvic exam does show small endocervical polyp not appreciated previously. Patient will follow up for endometrial biopsy, endocervical polyp biopsy and Pap smear results. Assuming all negative will keep bleeding calendar and as long as no further bleeding will monitor. If persistent bleeding she'll represent for further evaluation.    Anastasio Auerbach MD, 11:38 AM 05/22/2014

## 2014-05-22 NOTE — Addendum Note (Signed)
Addended by: Nelva Nay on: 05/22/2014 11:55 AM   Modules accepted: Orders

## 2014-05-24 LAB — CYTOLOGY - PAP

## 2014-05-29 ENCOUNTER — Encounter: Payer: Self-pay | Admitting: Family Medicine

## 2014-06-06 ENCOUNTER — Telehealth: Payer: Self-pay

## 2014-06-06 NOTE — Telephone Encounter (Signed)
Discussed BMD results with the patient and she has Osteopenia. Per Dr.Lowne the patient is to take Calcium 1200 mg and vitamin D 1000 along with weight bearing exercise daily and recheck in 2 years. The patient verbalized understanding. Copy mailed.      KP

## 2014-06-17 ENCOUNTER — Encounter: Payer: Self-pay | Admitting: Family Medicine

## 2014-08-02 ENCOUNTER — Telehealth: Payer: Self-pay | Admitting: Family Medicine

## 2014-08-02 NOTE — Telephone Encounter (Signed)
Pre Visit letter sent  °

## 2014-08-17 ENCOUNTER — Ambulatory Visit (INDEPENDENT_AMBULATORY_CARE_PROVIDER_SITE_OTHER): Payer: BC Managed Care – PPO | Admitting: Family Medicine

## 2014-08-17 VITALS — BP 128/82 | HR 58 | Temp 97.7°F | Resp 16 | Ht 63.5 in | Wt 129.6 lb

## 2014-08-17 DIAGNOSIS — R300 Dysuria: Secondary | ICD-10-CM

## 2014-08-17 LAB — POCT URINALYSIS DIPSTICK
Bilirubin, UA: NEGATIVE
Blood, UA: NEGATIVE
GLUCOSE UA: NEGATIVE
Ketones, UA: NEGATIVE
Leukocytes, UA: NEGATIVE
Nitrite, UA: NEGATIVE
PH UA: 7.5
Protein, UA: NEGATIVE
Spec Grav, UA: 1.01
UROBILINOGEN UA: 0.2

## 2014-08-17 LAB — POCT UA - MICROSCOPIC ONLY
Bacteria, U Microscopic: NEGATIVE
CASTS, UR, LPF, POC: NEGATIVE
Crystals, Ur, HPF, POC: NEGATIVE
Mucus, UA: NEGATIVE
WBC, Ur, HPF, POC: NEGATIVE
Yeast, UA: NEGATIVE

## 2014-08-17 MED ORDER — NITROFURANTOIN MONOHYD MACRO 100 MG PO CAPS: 100.0000 mg | ORAL_CAPSULE | Freq: Two times a day (BID) | ORAL | Status: DC

## 2014-08-17 MED ORDER — PHENAZOPYRIDINE HCL 200 MG PO TABS: 200.0000 mg | ORAL_TABLET | Freq: Three times a day (TID) | ORAL | Status: DC | PRN

## 2014-08-17 NOTE — Patient Instructions (Addendum)
Urinary Frequency The number of times a normal person urinates depends upon how much liquid they take in and how much liquid they are losing. If the temperature is hot and there is high humidity, then the person will sweat more and usually breathe a little more frequently. These factors decrease the amount of frequency of urination that would be considered normal. The amount you drink is easily determined, but the amount of fluid lost is sometimes more difficult to calculate.  Fluid is lost in two ways:  Sensible fluid loss is usually measured by the amount of urine that you get rid of. Losses of fluid can also occur with diarrhea.  Insensible fluid loss is more difficult to measure. It is caused by evaporation. Insensible loss of fluid occurs through breathing and sweating. It usually ranges from a little less than a quart to a little more than a quart of fluid a day. In normal temperatures and activity levels, the average person may urinate 4 to 7 times in a 24-hour period. Needing to urinate more often than that could indicate a problem. If one urinates 4 to 7 times in 24 hours and has large volumes each time, that could indicate a different problem from one who urinates 4 to 7 times a day and has small volumes. The time of urinating is also important. Most urinating should be done during the waking hours. Getting up at night to urinate frequently can indicate some problems. CAUSES  The bladder is the organ in your lower abdomen that holds urine. Like a balloon, it swells some as it fills up. Your nerves sense this and tell you it is time to head for the bathroom. There are a number of reasons that you might feel the need to urinate more often than usual. They include:  Urinary tract infection. This is usually associated with other signs such as burning when you urinate.  In men, problems with the prostate (a walnut-size gland that is located near the tube that carries urine out of your body). There  are two reasons why the prostate can cause an increased frequency of urination:  An enlarged prostate that does not let the bladder empty well. If the bladder only half empties when you urinate, then it only has half the capacity to fill before you have to urinate again.  The nerves in the bladder become more hypersensitive with an increased size of the prostate even if the bladder empties completely.  Pregnancy.  Obesity. Excess weight is more likely to cause a problem for women than for men.  Bladder stones or other bladder problems.  Caffeine.  Alcohol.  Medications. For example, drugs that help the body get rid of extra fluid (diuretics) increase urine production. Some other medicines must be taken with lots of fluids.  Muscle or nerve weakness. This might be the result of a spinal cord injury, a stroke, multiple sclerosis, or Parkinson disease.  Long-standing diabetes can decrease the sensation of the bladder. This loss of sensation makes it harder to sense the bladder needs to be emptied. Over a period of years, the bladder is stretched out by constant overfilling. This weakens the bladder muscles so that the bladder does not empty well and has less capacity to fill with new urine.  Interstitial cystitis (also called painful bladder syndrome). This condition develops because the tissues that line the inside of the bladder are inflamed (inflammation is the body's way of reacting to injury or infection). It causes pain and frequent   urination. It occurs in women more often than in men. DIAGNOSIS   To decide what might be causing your urinary frequency, your health care provider will probably:  Ask about symptoms you have noticed.  Ask about your overall health. This will include questions about any medications you are taking.  Do a physical examination.  Order some tests. These might include:  A blood test to check for diabetes or other health issues that could be contributing  to the problem.  Urine testing. This could measure the flow of urine and the pressure on the bladder.  A test of your neurological system (the brain, spinal cord, and nerves). This is the system that senses the need to urinate.  A bladder test to check whether it is emptying completely when you urinate.  Cystoscopy. This test uses a thin tube with a tiny camera on it. It offers a look inside your urethra and bladder to see if there are problems.  Imaging tests. You might be given a contrast dye and then asked to urinate. X-rays are taken to see how your bladder is working. TREATMENT  It is important for you to be evaluated to determine if the amount or frequency that you have is unusual or abnormal. If it is found to be abnormal, the cause should be determined and this can usually be found out easily. Depending upon the cause, treatment could include medication, stimulation of the nerves, or surgery. There are not too many things that you can do as an individual to change your urinary frequency. It is important that you balance the amount of fluid intake needed to compensate for your activity and the temperature. Medical problems will be diagnosed and taken care of by your physician. There is no particular bladder training such as Kegel exercises that you can do to help urinary frequency. This is an exercise that is usually recommended for people who have leaking of urine when they laugh, cough, or sneeze. HOME CARE INSTRUCTIONS   Take any medications your health care provider prescribed or suggested. Follow the directions carefully.  Practice any lifestyle changes that are recommended. These might include:  Drinking less fluid or drinking at different times of the day. If you need to urinate often during the night, for example, you may need to stop drinking fluids early in the evening.  Cutting down on caffeine or alcohol. They both can make you need to urinate more often than normal. Caffeine  is found in coffee, tea, and sodas.  Losing weight, if that is recommended.  Keep a journal or a log. You might be asked to record how much you drink and when and where you feel the need to urinate. This will also help evaluate how well the treatment provided by your physician is working. SEEK MEDICAL CARE IF:   Your need to urinate often gets worse.  You feel increased pain or irritation when you urinate.  You notice blood in your urine.  You have questions about any medications that your health care provider recommended.  You notice blood, pus, or swelling at the site of any test or treatment procedure.  You develop a fever of more than 100.5F (38.1C). SEEK IMMEDIATE MEDICAL CARE IF:  You develop a fever of more than 102.0F (38.9C). Document Released: 12/05/2008 Document Revised: 06/25/2013 Document Reviewed: 12/05/2008 [employer] Patient Information 2015 [employer]. This information is not intended to replace advice given to you by your health care provider. Make sure you discuss any questions you   have with your health care provider.  Kegel Exercises The goal of Kegel exercises is to isolate and exercise your pelvic floor muscles. These muscles act as a hammock that supports the rectum, vagina, small intestine, and uterus. As the muscles weaken, the hammock sags and these organs are displaced from their normal positions. Kegel exercises can strengthen your pelvic floor muscles and help you to improve bladder and bowel control, improve sexual response, and help reduce many problems and some discomfort during pregnancy. Kegel exercises can be done anywhere and at any time. HOW TO PERFORM [place] your pelvic floor muscles. To do this, squeeze (contract) the muscles that you use when you try to stop the flow of urine. You will feel a tightness in the vaginal area (women) and a tight lift in the rectal area (men and women).  When you begin, contract your pelvic  muscles tight for 2-5 seconds, then relax them for 2-5 seconds. This is one set. Do 4-5 sets with a short pause in between.  Contract your pelvic muscles for 8-10 seconds, then relax them for 8-10 seconds. Do 4-5 sets. If you cannot contract your pelvic muscles for 8-10 seconds, try 5-7 seconds and work your way up to 8-10 seconds. Your goal is 4-5 sets of 10 contractions each day. Keep your stomach, buttocks, and legs relaxed during the exercises. Perform sets of both short and long contractions. Vary your positions. Perform these contractions 3-4 times per day. Perform sets while you are:   Lying in bed in the morning.  Standing at lunch.  Sitting in the late afternoon.  Lying in bed at night. You should do 40-50 contractions per day. Do not perform more Kegel exercises per day than recommended. Overexercising can cause muscle fatigue. Continue these exercises for for at least 15-20 weeks or as directed by your caregiver. Document Released: 01/26/2012 Document Reviewed: 01/26/2012 [facility] Patient Information 2015 [place]. This information is not intended to replace advice given to you by your health care provider. Make sure you discuss any questions you have with your health care provider.  Urinary Tract Infection Urinary tract infections (UTIs) can develop anywhere along your urinary tract. Your urinary tract is your body's drainage system for removing wastes and extra water. Your urinary tract includes two kidneys, two ureters, a bladder, and a urethra. Your kidneys are a pair of bean-shaped organs. Each kidney is about the size of your fist. They are located below your ribs, one on each side of your spine. CAUSES Infections are caused by microbes, which are microscopic organisms, including fungi, viruses, and bacteria. These organisms are so small that they can only be seen through a microscope. Bacteria are the microbes that most commonly cause UTIs. SYMPTOMS  Symptoms of UTIs  may vary by age and gender of the patient and by the location of the infection. Symptoms in young women typically include a frequent and intense urge to urinate and a painful, burning feeling in the bladder or urethra during urination. Older women and men are more likely to be tired, shaky, and weak and have muscle aches and abdominal pain. A fever may mean the infection is in your kidneys. Other symptoms of a kidney infection include pain in your back or sides below the ribs, nausea, and vomiting. DIAGNOSIS To diagnose a UTI, your caregiver will ask you about your symptoms. Your caregiver also will ask to provide a urine sample. The urine sample will be tested for bacteria and white blood  cells. White blood cells are made by your body to help fight infection. TREATMENT  Typically, UTIs can be treated with medication. Because most UTIs are caused by a bacterial infection, they usually can be treated with the use of antibiotics. The choice of antibiotic and length of treatment depend on your symptoms and the type of bacteria causing your infection. HOME CARE INSTRUCTIONS  If you were prescribed antibiotics, take them exactly as your caregiver instructs you. Finish the medication even if you feel better after you have only taken some of the medication.  Drink enough water and fluids to keep your urine clear or pale yellow.  Avoid caffeine, tea, and carbonated beverages. They tend to irritate your bladder.  Empty your bladder often. Avoid holding urine for long periods of time.  Empty your bladder before and after sexual intercourse.  After a bowel movement, women should cleanse from front to back. Use each tissue only once. SEEK MEDICAL CARE IF:   You have back pain.  You develop a fever.  Your symptoms do not begin to resolve within 3 days. SEEK IMMEDIATE MEDICAL CARE IF:   You have severe back pain or lower abdominal pain.  You develop chills.  You have nausea or vomiting.  You have  continued burning or discomfort with urination. MAKE SURE YOU:   Understand these instructions.  Will watch your condition.  Will get help right away if you are not doing well or get worse. Document Released: 11/18/2004 Document Revised: 08/10/2011 Document Reviewed: 03/19/2011 [facility] Patient Information 2015 [place], [place]. This information is not intended to replace advice given to you by your health care provider. Make sure you discuss any questions you have with your health care provider.

## 2014-08-19 LAB — URINE CULTURE

## 2014-08-19 MED ORDER — CIPROFLOXACIN HCL 500 MG PO TABS: 500.0000 mg | ORAL_TABLET | Freq: Two times a day (BID) | ORAL | Status: DC

## 2014-08-19 NOTE — Progress Notes (Signed)
Subjective:    Patient ID: [name], female    DOB: Jul 16, 1955, 59 y.o.   MRN: [id number] Chief Complaint  Patient presents with  . Dysuria    Off and on for a few weeks per pt  . Back Pain    Low back left side  . Urinary Frequency    HPI  For the past few wks pt has noted urinary freq and after she voids she feels her urethra spasm.  Not bad dysuria but has been trying to really push fluids, water, and eliminate diet sodas.  Has had a UTI a couple yrs ago and felt similar though this is less severe. No n/v, f/c, flank pain. Does have chronic constipation treated with an apple a day.  Has a h/o fibroids followed by gyn and so has a physiologic vag d/c unchanged and occ few streaks of vaginal bleeding unchanged from baseline.  Past Medical History  Diagnosis Date  . Hypertension   . Allergy   . H/O Bell's palsy 05/2010  . Spontaneous pneumothorax 2005    R, s/p tube, then VATS   Current Outpatient Prescriptions on File Prior to Visit  Medication Sig Dispense Refill  . albuterol (PROVENTIL) (2.5 MG/3ML) 0.083% nebulizer solution Take 2.5 mg by nebulization every 6 (six) hours as needed for wheezing or shortness of breath.    . fluticasone (FLONASE) 50 MCG/ACT nasal spray Place 1 spray into both nostrils daily.    Marland Kitchen levocetirizine (XYZAL) 5 MG tablet Take 5 mg by mouth every evening.    . Olopatadine HCl (PATANASE) 0.6 % SOLN Place into the nose.    . spironolactone (ALDACTONE) 25 MG tablet take 1 tablet by mouth once daily 90 tablet 3  . valsartan (DIOVAN) 160 MG tablet take 1 tablet by mouth once daily 90 tablet 3  . chlorpheniramine (CHLOR-TRIMETON) 4 MG tablet Take 4 mg by mouth 2 (two) times daily as needed for allergies.    . sodium chloride (OCEAN) 0.65 % SOLN nasal spray Place 1 spray into both nostrils as needed for congestion.     No current facility-administered medications on file prior to visit.   No Known Allergies     Review of Systems  Constitutional:  Negative for fever, chills, diaphoresis, activity change, appetite change, fatigue and unexpected weight change.  Gastrointestinal: Negative for abdominal pain, diarrhea, constipation, blood in stool, anal bleeding and rectal pain.  Genitourinary: Positive for dysuria, urgency, frequency, vaginal bleeding, vaginal discharge and pelvic pain. Negative for hematuria, decreased urine volume, difficulty urinating, genital sores, vaginal pain, menstrual problem and dyspareunia.  Musculoskeletal: Negative for gait problem.  Skin: Negative for rash.  Hematological: Negative for adenopathy.  Psychiatric/Behavioral: The patient is not nervous/anxious.        Objective:  BP 128/82 mmHg  Pulse 58  Temp(Src) 97.7 F (36.5 C) (Oral)  Resp 16  Ht 5' 3.5" (1.613 m)  Wt 129 lb 9.6 oz (58.786 kg)  BMI 22.59 kg/m2  SpO2 99%  Physical Exam  Constitutional: She is oriented to person, place, and time. She appears well-developed and well-nourished. No distress.  HENT:  Head: Normocephalic and atraumatic.  Cardiovascular: Normal rate, regular rhythm, normal heart sounds and intact distal pulses.   Pulmonary/Chest: Effort normal and breath sounds normal.  Abdominal: Soft. Bowel sounds are normal. She exhibits no distension. There is no hepatosplenomegaly. There is no tenderness. There is no rebound, no guarding and no CVA tenderness.  Neurological: She is alert and oriented to  person, place, and time.  Skin: Skin is warm and dry. She is not diaphoretic.  Psychiatric: She has a normal mood and affect. Her behavior is normal.          Assessment & Plan:   1. Dysuria   UA nml but sxs have been indolent for sev wks so could be subclinical - recommend trial of pyridium while UClx is P - if pyridium DOES relieve sxs then she can fill the macrobid antibiotic - if it does not help then she should return for a pelvic exam if UClx is nml as well - pt agrees.  ADDENDUM: Clx+ - small amount of bacteria but treat  due to length of sxs - resistent to macrobid so sent in cipro.  Orders Placed This Encounter  Procedures  . Urine culture  . POCT UA - Microscopic Only  . POCT urinalysis dipstick    Meds ordered this encounter  Medications  . nitrofurantoin, macrocrystal-monohydrate, (MACROBID) 100 MG capsule    Sig: Take 1 capsule (100 mg total) by mouth 2 (two) times daily.    Dispense:  14 capsule    Refill:  0  . phenazopyridine (PYRIDIUM) 200 MG tablet    Sig: Take 1 tablet (200 mg total) by mouth 3 (three) times daily as needed for pain.    Dispense:  12 tablet    Refill:  0    Delman Cheadle, MD MPH  Results for orders placed or performed in visit on 08/17/14  Urine culture  Result Value Ref Range   Culture KLEBSIELLA PNEUMONIAE    Colony Count 20,OOO COLONIES/ML    Organism ID, Bacteria KLEBSIELLA PNEUMONIAE       Susceptibility   Klebsiella pneumoniae -  (no method available)    AMPICILLIN  Resistant     AMOX/CLAVULANIC <=2 Sensitive     AMPICILLIN/SULBACTAM 4 Sensitive     PIP/TAZO <=4 Sensitive     IMIPENEM <=0.25 Sensitive     CEFAZOLIN <=4 Sensitive     CEFTRIAXONE <=1 Sensitive     CEFTAZIDIME <=1 Sensitive     CEFEPIME <=1 Sensitive     GENTAMICIN <=1 Sensitive     TOBRAMYCIN <=1 Sensitive     CIPROFLOXACIN <=0.25 Sensitive     LEVOFLOXACIN <=0.12 Sensitive     NITROFURANTOIN 64 Intermediate     TRIMETH/SULFA <=20 Sensitive   POCT UA - Microscopic Only  Result Value Ref Range   WBC, Ur, HPF, POC neg    RBC, urine, microscopic 0-1    Bacteria, U Microscopic neg    Mucus, UA neg    Epithelial cells, urine per micros 0-1    Crystals, Ur, HPF, POC neg    Casts, Ur, LPF, POC neg    Yeast, UA neg   POCT urinalysis dipstick  Result Value Ref Range   Color, UA pale yellow    Clarity, UA clear    Glucose, UA neg    Bilirubin, UA neg    Ketones, UA neg    Spec Grav, UA 1.010    Blood, UA neg    pH, UA 7.5    Protein, UA neg    Urobilinogen, UA 0.2    Nitrite, UA neg     Leukocytes, UA Negative Negative

## 2014-08-21 ENCOUNTER — Telehealth: Payer: Self-pay | Admitting: *Deleted

## 2014-08-21 ENCOUNTER — Encounter: Payer: Self-pay | Admitting: *Deleted

## 2014-08-21 NOTE — Telephone Encounter (Signed)
Pre-Visit Call completed with patient and chart updated.   Pre-Visit Info documented in Specialty Comments under [employer].    

## 2014-08-22 ENCOUNTER — Encounter: Payer: BC Managed Care – PPO | Admitting: Family Medicine

## 2014-08-23 ENCOUNTER — Ambulatory Visit (INDEPENDENT_AMBULATORY_CARE_PROVIDER_SITE_OTHER): Payer: BC Managed Care – PPO | Admitting: Family Medicine

## 2014-08-23 ENCOUNTER — Encounter: Payer: Self-pay | Admitting: Family Medicine

## 2014-08-23 VITALS — BP 114/70 | HR 75 | Temp 98.2°F | Ht 63.5 in | Wt 128.6 lb

## 2014-08-23 DIAGNOSIS — M8580 Other specified disorders of bone density and structure, unspecified site: Secondary | ICD-10-CM | POA: Insufficient documentation

## 2014-08-23 DIAGNOSIS — I10 Essential (primary) hypertension: Secondary | ICD-10-CM

## 2014-08-23 DIAGNOSIS — Z0000 Encounter for general adult medical examination without abnormal findings: Secondary | ICD-10-CM

## 2014-08-23 HISTORY — DX: Other specified disorders of bone density and structure, unspecified site: M85.80

## 2014-08-23 LAB — CBC WITH DIFFERENTIAL/PLATELET
BASOS ABS: 0 10*3/uL (ref 0.0–0.1)
BASOS PCT: 0.6 % (ref 0.0–3.0)
EOS PCT: 1.7 % (ref 0.0–5.0)
Eosinophils Absolute: 0.1 10*3/uL (ref 0.0–0.7)
HCT: 39 % (ref 36.0–46.0)
Hemoglobin: 13.1 g/dL (ref 12.0–15.0)
Lymphocytes Relative: 29.7 % (ref 12.0–46.0)
Lymphs Abs: 1.1 10*3/uL (ref 0.7–4.0)
MCHC: 33.7 g/dL (ref 30.0–36.0)
MCV: 87 fl (ref 78.0–100.0)
Monocytes Absolute: 0.2 10*3/uL (ref 0.1–1.0)
Monocytes Relative: 5 % (ref 3.0–12.0)
NEUTROS PCT: 63 % (ref 43.0–77.0)
Neutro Abs: 2.4 10*3/uL (ref 1.4–7.7)
Platelets: 220 10*3/uL (ref 150.0–400.0)
RBC: 4.49 Mil/uL (ref 3.87–5.11)
RDW: 12.7 % (ref 11.5–15.5)
WBC: 3.8 10*3/uL — ABNORMAL LOW (ref 4.0–10.5)

## 2014-08-23 LAB — LIPID PANEL
Cholesterol: 176 mg/dL (ref 0–200)
HDL: 57.1 mg/dL (ref >39.00)
LDL Cholesterol: 106 mg/dL — ABNORMAL HIGH (ref 0–99)
NonHDL: 118.9
Total CHOL/HDL Ratio: 3
Triglycerides: 64 mg/dL (ref 0.0–149.0)
VLDL: 12.8 mg/dL (ref 0.0–40.0)

## 2014-08-23 LAB — HEPATIC FUNCTION PANEL
ALBUMIN: 4.2 g/dL (ref 3.5–5.2)
ALT: 15 U/L (ref 0–35)
AST: 15 U/L (ref 0–37)
Alkaline Phosphatase: 97 U/L (ref 39–117)
Bilirubin, Direct: 0.1 mg/dL (ref 0.0–0.3)
TOTAL PROTEIN: 7.2 g/dL (ref 6.0–8.3)
Total Bilirubin: 0.7 mg/dL (ref 0.2–1.2)

## 2014-08-23 LAB — BASIC METABOLIC PANEL
BUN: 8 mg/dL (ref 6–23)
CO2: 29 mEq/L (ref 19–32)
Calcium: 9.2 mg/dL (ref 8.4–10.5)
Chloride: 101 mEq/L (ref 96–112)
Creatinine, Ser: 0.67 mg/dL (ref 0.40–1.20)
GFR: 94.98 mL/min (ref >60.00)
Glucose, Bld: 101 mg/dL — ABNORMAL HIGH (ref 70–99)
POTASSIUM: 3.4 meq/L — AB (ref 3.5–5.1)
SODIUM: 137 meq/L (ref 135–145)

## 2014-08-23 LAB — POCT URINALYSIS DIPSTICK
Bilirubin, UA: NEGATIVE
Blood, UA: NEGATIVE
Glucose, UA: NEGATIVE
Ketones, UA: NEGATIVE
Leukocytes, UA: NEGATIVE
Nitrite, UA: NEGATIVE
Protein, UA: NEGATIVE
Spec Grav, UA: <=1.005
Urobilinogen, UA: 4
pH, UA: 7.5

## 2014-08-23 LAB — TSH: TSH: 0.66 u[IU]/mL (ref 0.35–4.50)

## 2014-08-23 MED ORDER — SPIRONOLACTONE 25 MG PO TABS: ORAL_TABLET | ORAL | Status: DC

## 2014-08-23 MED ORDER — VALSARTAN 160 MG PO TABS: ORAL_TABLET | ORAL | Status: DC

## 2014-08-23 NOTE — Progress Notes (Signed)
Pre visit review using our clinic review tool, if applicable. No additional management support is needed unless otherwise documented below in the visit note. 

## 2014-08-23 NOTE — Patient Instructions (Signed)
Preventive Care for Adults A healthy lifestyle and preventive care can promote health and wellness. Preventive health guidelines for women include the following key practices.  A routine yearly physical is a good way to check with your health care provider about your health and preventive screening. It is a chance to share any concerns and updates on your health and to receive a thorough exam.  Visit your dentist for a routine exam and preventive care every 6 months. Brush your teeth twice a day and floss once a day. Good oral hygiene prevents tooth decay and gum disease.  The frequency of eye exams is based on your age, health, family medical history, use of contact lenses, and other factors. Follow your health care provider's recommendations for frequency of eye exams.  Eat a healthy diet. Foods like vegetables, fruits, whole grains, low-fat dairy products, and lean protein foods contain the nutrients you need without too many calories. Decrease your intake of foods high in solid fats, added sugars, and salt. Eat the right amount of calories for you.Get information about a proper diet from your health care provider, if necessary.  Regular physical exercise is one of the most important things you can do for your health. Most adults should get at least 150 minutes of moderate-intensity exercise (any activity that increases your heart rate and causes you to sweat) each week. In addition, most adults need muscle-strengthening exercises on 2 or more days a week.  Maintain a healthy weight. The body mass index (BMI) is a screening tool to identify possible weight problems. It provides an estimate of body fat based on height and weight. Your health care provider can find your BMI and can help you achieve or maintain a healthy weight.For adults 20 years and older:  A BMI below 18.5 is considered underweight.  A BMI of 18.5 to 24.9 is normal.  A BMI of 25 to 29.9 is considered overweight.  A BMI of  30 and above is considered obese.  Maintain normal blood lipids and cholesterol levels by exercising and minimizing your intake of saturated fat. Eat a balanced diet with plenty of fruit and vegetables. Blood tests for lipids and cholesterol should begin at age 76 and be repeated every 5 years. If your lipid or cholesterol levels are high, you are over 50, or you are at high risk for heart disease, you may need your cholesterol levels checked more frequently.Ongoing high lipid and cholesterol levels should be treated with medicines if diet and exercise are not working.  If you smoke, find out from your health care provider how to quit. If you do not use tobacco, do not start.  Lung cancer screening is recommended for adults aged 22-80 years who are at high risk for developing lung cancer because of a history of smoking. A yearly low-dose CT scan of the lungs is recommended for people who have at least a 30-pack-year history of smoking and are a current smoker or have quit within the past 15 years. A pack year of smoking is smoking an average of 1 pack of cigarettes a day for 1 year (for example: 1 pack a day for 30 years or 2 packs a day for 15 years). Yearly screening should continue until the smoker has stopped smoking for at least 15 years. Yearly screening should be stopped for people who develop a health problem that would prevent them from having lung cancer treatment.  If you are pregnant, do not drink alcohol. If you are breastfeeding,  be very cautious about drinking alcohol. If you are not pregnant and choose to drink alcohol, do not have more than 1 drink per day. One drink is considered to be 12 ounces (355 mL) of beer, 5 ounces (148 mL) of wine, or 1.5 ounces (44 mL) of liquor.  Avoid use of street drugs. Do not share needles with anyone. Ask for help if you need support or instructions about stopping the use of drugs.  High blood pressure causes heart disease and increases the risk of  stroke. Your blood pressure should be checked at least every 1 to 2 years. Ongoing high blood pressure should be treated with medicines if weight loss and exercise do not work.  If you are 75-52 years old, ask your health care provider if you should take aspirin to prevent strokes.  Diabetes screening involves taking a blood sample to check your fasting blood sugar level. This should be done once every 3 years, after age 15, if you are within normal weight and without risk factors for diabetes. Testing should be considered at a younger age or be carried out more frequently if you are overweight and have at least 1 risk factor for diabetes.  Breast cancer screening is essential preventive care for women. You should practice "breast self-awareness." This means understanding the normal appearance and feel of your breasts and may include breast self-examination. Any changes detected, no matter how small, should be reported to a health care provider. Women in their 58s and 30s should have a clinical breast exam (CBE) by a health care provider as part of a regular health exam every 1 to 3 years. After age 16, women should have a CBE every year. Starting at age 53, women should consider having a mammogram (breast X-ray test) every year. Women who have a family history of breast cancer should talk to their health care provider about genetic screening. Women at a high risk of breast cancer should talk to their health care providers about having an MRI and a mammogram every year.  Breast cancer gene (BRCA)-related cancer risk assessment is recommended for women who have family members with BRCA-related cancers. BRCA-related cancers include breast, ovarian, tubal, and peritoneal cancers. Having family members with these cancers may be associated with an increased risk for harmful changes (mutations) in the breast cancer genes BRCA1 and BRCA2. Results of the assessment will determine the need for genetic counseling and  BRCA1 and BRCA2 testing.  Routine pelvic exams to screen for cancer are no longer recommended for nonpregnant women who are considered low risk for cancer of the pelvic organs (ovaries, uterus, and vagina) and who do not have symptoms. Ask your health care provider if a screening pelvic exam is right for you.  If you have had past treatment for cervical cancer or a condition that could lead to cancer, you need Pap tests and screening for cancer for at least 20 years after your treatment. If Pap tests have been discontinued, your risk factors (such as having a new sexual partner) need to be reassessed to determine if screening should be resumed. Some women have medical problems that increase the chance of getting cervical cancer. In these cases, your health care provider may recommend more frequent screening and Pap tests.  The HPV test is an additional test that may be used for cervical cancer screening. The HPV test looks for the virus that can cause the cell changes on the cervix. The cells collected during the Pap test can be  tested for HPV. The HPV test could be used to screen women aged 30 years and older, and should be used in women of any age who have unclear Pap test results. After the age of 30, women should have HPV testing at the same frequency as a Pap test.  Colorectal cancer can be detected and often prevented. Most routine colorectal cancer screening begins at the age of 50 years and continues through age 75 years. However, your health care provider may recommend screening at an earlier age if you have risk factors for colon cancer. On a yearly basis, your health care provider may provide home test kits to check for hidden blood in the stool. Use of a small camera at the end of a tube, to directly examine the colon (sigmoidoscopy or colonoscopy), can detect the earliest forms of colorectal cancer. Talk to your health care provider about this at age 50, when routine screening begins. Direct  exam of the colon should be repeated every 5-10 years through age 75 years, unless early forms of pre-cancerous polyps or small growths are found.  People who are at an increased risk for hepatitis B should be screened for this virus. You are considered at high risk for hepatitis B if:  You were born in a country where hepatitis B occurs often. Talk with your health care provider about which countries are considered high risk.  Your parents were born in a high-risk country and you have not received a shot to protect against hepatitis B (hepatitis B vaccine).  You have HIV or AIDS.  You use needles to inject street drugs.  You live with, or have sex with, someone who has hepatitis B.  You get hemodialysis treatment.  You take certain medicines for conditions like cancer, organ transplantation, and autoimmune conditions.  Hepatitis C blood testing is recommended for all people born from 1945 through 1965 and any individual with known risks for hepatitis C.  Practice safe sex. Use condoms and avoid high-risk sexual practices to reduce the spread of sexually transmitted infections (STIs). STIs include gonorrhea, chlamydia, syphilis, trichomonas, herpes, HPV, and human immunodeficiency virus (HIV). Herpes, HIV, and HPV are viral illnesses that have no cure. They can result in disability, cancer, and death.  You should be screened for sexually transmitted illnesses (STIs) including gonorrhea and chlamydia if:  You are sexually active and are younger than 24 years.  You are older than 24 years and your health care provider tells you that you are at risk for this type of infection.  Your sexual activity has changed since you were last screened and you are at an increased risk for chlamydia or gonorrhea. Ask your health care provider if you are at risk.  If you are at risk of being infected with HIV, it is recommended that you take a prescription medicine daily to prevent HIV infection. This is  called preexposure prophylaxis (PrEP). You are considered at risk if:  You are a heterosexual woman, are sexually active, and are at increased risk for HIV infection.  You take drugs by injection.  You are sexually active with a partner who has HIV.  Talk with your health care provider about whether you are at high risk of being infected with HIV. If you choose to begin PrEP, you should first be tested for HIV. You should then be tested every 3 months for as long as you are taking PrEP.  Osteoporosis is a disease in which the bones lose minerals and strength   with aging. This can result in serious bone fractures or breaks. The risk of osteoporosis can be identified using a bone density scan. Women ages 65 years and over and women at risk for fractures or osteoporosis should discuss screening with their health care providers. Ask your health care provider whether you should take a calcium supplement or vitamin D to reduce the rate of osteoporosis.  Menopause can be associated with physical symptoms and risks. Hormone replacement therapy is available to decrease symptoms and risks. You should talk to your health care provider about whether hormone replacement therapy is right for you.  Use sunscreen. Apply sunscreen liberally and repeatedly throughout the day. You should seek shade when your shadow is shorter than you. Protect yourself by wearing long sleeves, pants, a wide-brimmed hat, and sunglasses year round, whenever you are outdoors.  Once a month, do a whole body skin exam, using a mirror to look at the skin on your back. Tell your health care provider of new moles, moles that have irregular borders, moles that are larger than a pencil eraser, or moles that have changed in shape or color.  Stay current with required vaccines (immunizations).  Influenza vaccine. All adults should be immunized every year.  Tetanus, diphtheria, and acellular pertussis (Td, Tdap) vaccine. Pregnant women should  receive 1 dose of Tdap vaccine during each pregnancy. The dose should be obtained regardless of the length of time since the last dose. Immunization is preferred during the 27th-36th week of gestation. An adult who has not previously received Tdap or who does not know her vaccine status should receive 1 dose of Tdap. This initial dose should be followed by tetanus and diphtheria toxoids (Td) booster doses every 10 years. Adults with an unknown or incomplete history of completing a 3-dose immunization series with Td-containing vaccines should begin or complete a primary immunization series including a Tdap dose. Adults should receive a Td booster every 10 years.  Varicella vaccine. An adult without evidence of immunity to varicella should receive 2 doses or a second dose if she has previously received 1 dose. Pregnant females who do not have evidence of immunity should receive the first dose after pregnancy. This first dose should be obtained before leaving the health care facility. The second dose should be obtained 4-8 weeks after the first dose.  Human papillomavirus (HPV) vaccine. Females aged 13-26 years who have not received the vaccine previously should obtain the 3-dose series. The vaccine is not recommended for use in pregnant females. However, pregnancy testing is not needed before receiving a dose. If a female is found to be pregnant after receiving a dose, no treatment is needed. In that case, the remaining doses should be delayed until after the pregnancy. Immunization is recommended for any person with an immunocompromised condition through the age of 26 years if she did not get any or all doses earlier. During the 3-dose series, the second dose should be obtained 4-8 weeks after the first dose. The third dose should be obtained 24 weeks after the first dose and 16 weeks after the second dose.  Zoster vaccine. One dose is recommended for adults aged 60 years or older unless certain conditions are  present.  Measles, mumps, and rubella (MMR) vaccine. Adults born before 1957 generally are considered immune to measles and mumps. Adults born in 1957 or later should have 1 or more doses of MMR vaccine unless there is a contraindication to the vaccine or there is laboratory evidence of immunity to   each of the three diseases. A routine second dose of MMR vaccine should be obtained at least 28 days after the first dose for students attending postsecondary schools, health care workers, or international travelers. People who received inactivated measles vaccine or an unknown type of measles vaccine during 1963-1967 should receive 2 doses of MMR vaccine. People who received inactivated mumps vaccine or an unknown type of mumps vaccine before 1979 and are at high risk for mumps infection should consider immunization with 2 doses of MMR vaccine. For females of childbearing age, rubella immunity should be determined. If there is no evidence of immunity, females who are not pregnant should be vaccinated. If there is no evidence of immunity, females who are pregnant should delay immunization until after pregnancy. Unvaccinated health care workers born before 1957 who lack laboratory evidence of measles, mumps, or rubella immunity or laboratory confirmation of disease should consider measles and mumps immunization with 2 doses of MMR vaccine or rubella immunization with 1 dose of MMR vaccine.  Pneumococcal 13-valent conjugate (PCV13) vaccine. When indicated, a person who is uncertain of her immunization history and has no record of immunization should receive the PCV13 vaccine. An adult aged 19 years or older who has certain medical conditions and has not been previously immunized should receive 1 dose of PCV13 vaccine. This PCV13 should be followed with a dose of pneumococcal polysaccharide (PPSV23) vaccine. The PPSV23 vaccine dose should be obtained at least 8 weeks after the dose of PCV13 vaccine. An adult aged 19  years or older who has certain medical conditions and previously received 1 or more doses of PPSV23 vaccine should receive 1 dose of PCV13. The PCV13 vaccine dose should be obtained 1 or more years after the last PPSV23 vaccine dose.  Pneumococcal polysaccharide (PPSV23) vaccine. When PCV13 is also indicated, PCV13 should be obtained first. All adults aged 65 years and older should be immunized. An adult younger than age 65 years who has certain medical conditions should be immunized. Any person who resides in a nursing home or long-term care facility should be immunized. An adult smoker should be immunized. People with an immunocompromised condition and certain other conditions should receive both PCV13 and PPSV23 vaccines. People with human immunodeficiency virus (HIV) infection should be immunized as soon as possible after diagnosis. Immunization during chemotherapy or radiation therapy should be avoided. Routine use of PPSV23 vaccine is not recommended for American Indians, [place] Natives, or people younger than 65 years unless there are medical conditions that require PPSV23 vaccine. When indicated, people who have unknown immunization and have no record of immunization should receive PPSV23 vaccine. One-time revaccination 5 years after the first dose of PPSV23 is recommended for people aged 19-64 years who have chronic kidney failure, nephrotic syndrome, asplenia, or immunocompromised conditions. People who received 1-2 doses of PPSV23 before age 65 years should receive another dose of PPSV23 vaccine at age 65 years or later if at least 5 years have passed since the previous dose. Doses of PPSV23 are not needed for people immunized with PPSV23 at or after age 65 years.  Meningococcal vaccine. Adults with asplenia or persistent complement component deficiencies should receive 2 doses of quadrivalent meningococcal conjugate (MenACWY-D) vaccine. The doses should be obtained at least 2 months apart.  Microbiologists working with certain meningococcal bacteria, military recruits, people at risk during an outbreak, and people who travel to or live in countries with a high rate of meningitis should be immunized. A first-year college student up through age   21 years who is living in a residence hall should receive a dose if she did not receive a dose on or after her 16th birthday. Adults who have certain high-risk conditions should receive one or more doses of vaccine.  Hepatitis A vaccine. Adults who wish to be protected from this disease, have certain high-risk conditions, work with hepatitis A-infected animals, work in hepatitis A research labs, or travel to or work in countries with a high rate of hepatitis A should be immunized. Adults who were previously unvaccinated and who anticipate close contact with an international adoptee during the first 60 days after arrival in the [place] from a country with a high rate of hepatitis A should be immunized.  Hepatitis B vaccine. Adults who wish to be protected from this disease, have certain high-risk conditions, may be exposed to blood or other infectious body fluids, are household contacts or sex partners of hepatitis B positive people, are clients or workers in certain care facilities, or travel to or work in countries with a high rate of hepatitis B should be immunized.  Haemophilus influenzae type b (Hib) vaccine. A previously unvaccinated person with asplenia or sickle cell disease or having a scheduled splenectomy should receive 1 dose of Hib vaccine. Regardless of previous immunization, a recipient of a hematopoietic stem cell transplant should receive a 3-dose series 6-12 months after her successful transplant. Hib vaccine is not recommended for adults with HIV infection. Preventive Services / Frequency Ages 64 to 68 years  Blood pressure check.** / Every 1 to 2 years.  Lipid and cholesterol check.** / Every 5 years beginning at age  22.  Clinical breast exam.** / Every 3 years for women in their 88s and 53s.  BRCA-related cancer risk assessment.** / For women who have family members with a BRCA-related cancer (breast, ovarian, tubal, or peritoneal cancers).  Pap test.** / Every 2 years from ages 90 through 51. Every 3 years starting at age 21 through age 56 or 3 with a history of 3 consecutive normal Pap tests.  HPV screening.** / Every 3 years from ages 24 through ages 1 to 46 with a history of 3 consecutive normal Pap tests.  Hepatitis C blood test.** / For any individual with known risks for hepatitis C.  Skin self-exam. / Monthly.  Influenza vaccine. / Every year.  Tetanus, diphtheria, and acellular pertussis (Tdap, Td) vaccine.** / Consult your health care provider. Pregnant women should receive 1 dose of Tdap vaccine during each pregnancy. 1 dose of Td every 10 years.  Varicella vaccine.** / Consult your health care provider. Pregnant females who do not have evidence of immunity should receive the first dose after pregnancy.  HPV vaccine. / 3 doses over 6 months, if 72 and younger. The vaccine is not recommended for use in pregnant females. However, pregnancy testing is not needed before receiving a dose.  Measles, mumps, rubella (MMR) vaccine.** / You need at least 1 dose of MMR if you were born in 1957 or later. You may also need a 2nd dose. For females of childbearing age, rubella immunity should be determined. If there is no evidence of immunity, females who are not pregnant should be vaccinated. If there is no evidence of immunity, females who are pregnant should delay immunization until after pregnancy.  Pneumococcal 13-valent conjugate (PCV13) vaccine.** / Consult your health care provider.  Pneumococcal polysaccharide (PPSV23) vaccine.** / 1 to 2 doses if you smoke cigarettes or if you have certain conditions.  Meningococcal vaccine.** /  1 dose if you are age 19 to 21 years and a first-year college  student living in a residence hall, or have one of several medical conditions, you need to get vaccinated against meningococcal disease. You may also need additional booster doses.  Hepatitis A vaccine.** / Consult your health care provider.  Hepatitis B vaccine.** / Consult your health care provider.  Haemophilus influenzae type b (Hib) vaccine.** / Consult your health care provider. Ages 40 to 64 years  Blood pressure check.** / Every 1 to 2 years.  Lipid and cholesterol check.** / Every 5 years beginning at age 20 years.  Lung cancer screening. / Every year if you are aged 55-80 years and have a 30-pack-year history of smoking and currently smoke or have quit within the past 15 years. Yearly screening is stopped once you have quit smoking for at least 15 years or develop a health problem that would prevent you from having lung cancer treatment.  Clinical breast exam.** / Every year after age 40 years.  BRCA-related cancer risk assessment.** / For women who have family members with a BRCA-related cancer (breast, ovarian, tubal, or peritoneal cancers).  Mammogram.** / Every year beginning at age 40 years and continuing for as long as you are in good health. Consult with your health care provider.  Pap test.** / Every 3 years starting at age 30 years through age 65 or 70 years with a history of 3 consecutive normal Pap tests.  HPV screening.** / Every 3 years from ages 30 years through ages 65 to 70 years with a history of 3 consecutive normal Pap tests.  Fecal occult blood test (FOBT) of stool. / Every year beginning at age 50 years and continuing until age 75 years. You may not need to do this test if you get a colonoscopy every 10 years.  Flexible sigmoidoscopy or colonoscopy.** / Every 5 years for a flexible sigmoidoscopy or every 10 years for a colonoscopy beginning at age 50 years and continuing until age 75 years.  Hepatitis C blood test.** / For all people born from 1945 through  1965 and any individual with known risks for hepatitis C.  Skin self-exam. / Monthly.  Influenza vaccine. / Every year.  Tetanus, diphtheria, and acellular pertussis (Tdap/Td) vaccine.** / Consult your health care provider. Pregnant women should receive 1 dose of Tdap vaccine during each pregnancy. 1 dose of Td every 10 years.  Varicella vaccine.** / Consult your health care provider. Pregnant females who do not have evidence of immunity should receive the first dose after pregnancy.  Zoster vaccine.** / 1 dose for adults aged 60 years or older.  Measles, mumps, rubella (MMR) vaccine.** / You need at least 1 dose of MMR if you were born in 1957 or later. You may also need a 2nd dose. For females of childbearing age, rubella immunity should be determined. If there is no evidence of immunity, females who are not pregnant should be vaccinated. If there is no evidence of immunity, females who are pregnant should delay immunization until after pregnancy.  Pneumococcal 13-valent conjugate (PCV13) vaccine.** / Consult your health care provider.  Pneumococcal polysaccharide (PPSV23) vaccine.** / 1 to 2 doses if you smoke cigarettes or if you have certain conditions.  Meningococcal vaccine.** / Consult your health care provider.  Hepatitis A vaccine.** / Consult your health care provider.  Hepatitis B vaccine.** / Consult your health care provider.  Haemophilus influenzae type b (Hib) vaccine.** / Consult your health care provider. Ages 65   years and over  Blood pressure check.** / Every 1 to 2 years.  Lipid and cholesterol check.** / Every 5 years beginning at age 22 years.  Lung cancer screening. / Every year if you are aged 73-80 years and have a 30-pack-year history of smoking and currently smoke or have quit within the past 15 years. Yearly screening is stopped once you have quit smoking for at least 15 years or develop a health problem that would prevent you from having lung cancer  treatment.  Clinical breast exam.** / Every year after age 4 years.  BRCA-related cancer risk assessment.** / For women who have family members with a BRCA-related cancer (breast, ovarian, tubal, or peritoneal cancers).  Mammogram.** / Every year beginning at age 40 years and continuing for as long as you are in good health. Consult with your health care provider.  Pap test.** / Every 3 years starting at age 9 years through age 34 or 91 years with 3 consecutive normal Pap tests. Testing can be stopped between 65 and 70 years with 3 consecutive normal Pap tests and no abnormal Pap or HPV tests in the past 10 years.  HPV screening.** / Every 3 years from ages 57 years through ages 64 or 45 years with a history of 3 consecutive normal Pap tests. Testing can be stopped between 65 and 70 years with 3 consecutive normal Pap tests and no abnormal Pap or HPV tests in the past 10 years.  Fecal occult blood test (FOBT) of stool. / Every year beginning at age 15 years and continuing until age 17 years. You may not need to do this test if you get a colonoscopy every 10 years.  Flexible sigmoidoscopy or colonoscopy.** / Every 5 years for a flexible sigmoidoscopy or every 10 years for a colonoscopy beginning at age 86 years and continuing until age 71 years.  Hepatitis C blood test.** / For all people born from [birth year] through 1967 and any individual with known risks for hepatitis C.  Osteoporosis screening.** / A one-time screening for women ages 83 years and over and women at risk for fractures or osteoporosis.  Skin self-exam. / Monthly.  Influenza vaccine. / Every year.  Tetanus, diphtheria, and acellular pertussis (Tdap/Td) vaccine.** / 1 dose of Td every 10 years.  Varicella vaccine.** / Consult your health care provider.  Zoster vaccine.** / 1 dose for adults aged 61 years or older.  Pneumococcal 13-valent conjugate (PCV13) vaccine.** / Consult your health care provider.  Pneumococcal  polysaccharide (PPSV23) vaccine.** / 1 dose for all adults aged 28 years and older.  Meningococcal vaccine.** / Consult your health care provider.  Hepatitis A vaccine.** / Consult your health care provider.  Hepatitis B vaccine.** / Consult your health care provider.  Haemophilus influenzae type b (Hib) vaccine.** / Consult your health care provider. ** Family history and personal history of risk and conditions may change your health care provider's recommendations. Document Released: 04/06/2001 Document Revised: 06/25/2013 Document Reviewed: 07/06/2010 [facility] Patient Information 2015 [place], [place]. This information is not intended to replace advice given to you by your health care provider. Make sure you discuss any questions you have with your health care provider.

## 2014-08-23 NOTE — Progress Notes (Signed)
Subjective:     [name] is a 59 y.o. female and is here for a comprehensive physical exam. The patient reports problems - still has urinary symptoms.  The pt was seen in urgent care and given cipro for 3 days.  --- still having symptoms  History   Social History  . Marital Status: Married    Spouse Name: N/A  . Number of Children: 3  . Years of Education: N/A   Occupational History  . [employer]   Social History Main Topics  . Smoking status: Former Smoker    Types: Cigarettes    Quit date: 07/11/1982  . Smokeless tobacco: Never Used  . Alcohol Use: 0.0 oz/week    0 Standard drinks or equivalent per week     Comment: rare  . Drug Use: No  . Sexual Activity:    Partners: Male    Birth Control/ Protection: Post-menopausal     Comment: 1st intercourse 59 yo-Fewer than 5 partners   Other Topics Concern  . Not on file   Social History Narrative   Exercise-- jazzersize  3x a week    Health Maintenance  Topic Date Due  . HIV Screening  08/23/2015 (Originally 03/31/1970)  . INFLUENZA VACCINE  09/23/2014  . MAMMOGRAM  05/05/2016  . PAP SMEAR  05/21/2017  . COLONOSCOPY  06/06/2017  . TETANUS/TDAP  04/18/2018  . ZOSTAVAX  Completed    The following portions of the patient's history were reviewed and updated as appropriate:  She  has a past medical history of Hypertension; Allergy; H/O Bell's palsy (05/2010); and Spontaneous pneumothorax (2005). She  does not have any pertinent problems on file. She  has past surgical history that includes Chest tube insertion (2005); Video assisted thoracoscopy (2005); and Cesarean section. Her family history includes Cancer (age of onset: 5) in her maternal grandmother; Diabetes in her maternal grandmother; Heart disease in her father; Hypertension in her father and mother; Stroke in her mother. She  reports that she quit smoking about 32 years ago. Her smoking use included Cigarettes. She  has never used smokeless tobacco. She reports that she drinks alcohol. She reports that she does not use illicit drugs. She has a current medication list which includes the following prescription(s): albuterol, chlorpheniramine, ciprofloxacin, fluticasone, levocetirizine, olopatadine hcl, phenazopyridine, sodium chloride, spironolactone, and valsartan. Current Outpatient Prescriptions on File Prior to Visit  Medication Sig Dispense Refill  . albuterol (PROVENTIL) (2.5 MG/3ML) 0.083% nebulizer solution Take 2.5 mg by nebulization every 6 (six) hours as needed for wheezing or shortness of breath.    . chlorpheniramine (CHLOR-TRIMETON) 4 MG tablet Take 4 mg by mouth 2 (two) times daily as needed for allergies.    . ciprofloxacin (CIPRO) 500 MG tablet Take 1 tablet (500 mg total) by mouth 2 (two) times daily. 6 tablet 0  . fluticasone (FLONASE) 50 MCG/ACT nasal spray Place 1 spray into both nostrils daily.    Marland Kitchen levocetirizine (XYZAL) 5 MG tablet Take 5 mg by mouth every evening.    . Olopatadine HCl (PATANASE) 0.6 % SOLN Place into the nose.    . phenazopyridine (PYRIDIUM) 200 MG tablet Take 1 tablet (200 mg total) by mouth 3 (three) times daily as needed for pain. 12 tablet 0  . sodium chloride (OCEAN) 0.65 % SOLN nasal spray Place 1 spray into both nostrils as needed for congestion.     No current facility-administered medications on file prior to visit.   She has  No Known Allergies..  Review of Systems Review of Systems  Constitutional: Negative for activity change, appetite change and fatigue.  HENT: Negative for hearing loss, congestion, tinnitus and ear discharge.  dentist q46m Eyes: Negative for visual disturbance (see optho q1y -- vision corrected to 20/20 with glasses).  Respiratory: Negative for cough, chest tightness and shortness of breath.   Cardiovascular: Negative for chest pain, palpitations and leg swelling.  Gastrointestinal: Negative for abdominal pain, diarrhea, constipation  and abdominal distention.  Genitourinary: Negative for urgency, frequency, decreased urine volume and difficulty urinating.  Musculoskeletal: Negative for back pain, arthralgias and gait problem.  Skin: Negative for color change, pallor and rash.  Neurological: Negative for dizziness, light-headedness, numbness and headaches.  Hematological: Negative for adenopathy. Does not bruise/bleed easily.  Psychiatric/Behavioral: Negative for suicidal ideas, confusion, sleep disturbance, self-injury, dysphoric mood, decreased concentration and agitation.      Objective:    BP 114/70 mmHg  Pulse 75  Temp(Src) 98.2 F (36.8 C) (Oral)  Ht 5' 3.5" (1.613 m)  Wt 128 lb 9.6 oz (58.333 kg)  BMI 22.42 kg/m2  SpO2 98% General appearance: alert, cooperative, appears stated age and no distress Head: Normocephalic, without obvious abnormality, atraumatic Eyes: conjunctivae/corneas clear. PERRL, EOM's intact. Fundi benign. Ears: normal TM's and external ear canals both ears Nose: Nares normal. Septum midline. Mucosa normal. No drainage or sinus tenderness. Throat: lips, mucosa, and tongue normal; teeth and gums normal Neck: no adenopathy, no carotid bruit, no JVD, supple, symmetrical, trachea midline and thyroid not enlarged, symmetric, no tenderness/mass/nodules Back: symmetric, no curvature. ROM normal. No CVA tenderness. Lungs: clear to auscultation bilaterally Breasts: gyn  Heart: regular rate and rhythm, S1, S2 normal, no murmur, click, rub or gallop Abdomen: soft, non-tender; bowel sounds normal; no masses,  no organomegaly Pelvic: deferred--gyn Extremities: extremities normal, atraumatic, no cyanosis or edema Pulses: 2+ and symmetric Skin: Skin color, texture, turgor normal. No rashes or lesions Lymph nodes: Cervical, supraclavicular, and axillary nodes normal. Neurologic: Alert and oriented X 3, normal strength and tone. Normal symmetric reflexes. Normal coordination and gait Psych- no  depression, no anxiety      Assessment:    Healthy female exam.      Plan:    ghm utd Check labs See After Visit Summary for Counseling Recommendations    1. Essential hypertension stable - valsartan (DIOVAN) 160 MG tablet; take 1 tablet by mouth once daily  Dispense: 90 tablet; Refill: 3 - spironolactone (ALDACTONE) 25 MG tablet; take 1 tablet by mouth once daily  Dispense: 90 tablet; Refill: 3 - Basic metabolic panel - CBC with Differential/Platelet - Hepatic function panel - Lipid panel - POCT urinalysis dipstick - TSH  2. Preventative health care See AVS - Basic metabolic panel - CBC with Differential/Platelet - Hepatic function panel - Lipid panel - POCT urinalysis dipstick - TSH  3. Osteopenia     See BMD

## 2014-08-27 ENCOUNTER — Telehealth: Payer: Self-pay | Admitting: Family Medicine

## 2014-08-27 DIAGNOSIS — N3090 Cystitis, unspecified without hematuria: Secondary | ICD-10-CM

## 2014-08-27 NOTE — Telephone Encounter (Addendum)
Relation to pt: self   Call back number: [phone]  Reason for call:  Pt returning your call regarding lab results taken 08/23/14.

## 2014-08-28 NOTE — Telephone Encounter (Signed)
Reviewed labs with patient. Order entered for urology.

## 2014-10-05 ENCOUNTER — Encounter: Payer: Self-pay | Admitting: Family Medicine

## 2014-10-25 ENCOUNTER — Encounter: Payer: Self-pay | Admitting: Gastroenterology

## 2015-01-20 HISTORY — PX: SPIROMETRY: SHX456

## 2015-02-08 IMAGING — CR DG CHEST 2V
2 series · 2 of 2 positions shown · non-contrast
Comparison: CHEST x-ray 05/22/2011.

CLINICAL DATA: Heaviness in the right side of the chest for the
past week. Prior history of spontaneous pneumothorax.

EXAM:
CHEST  2 VIEW

[w chest pa]
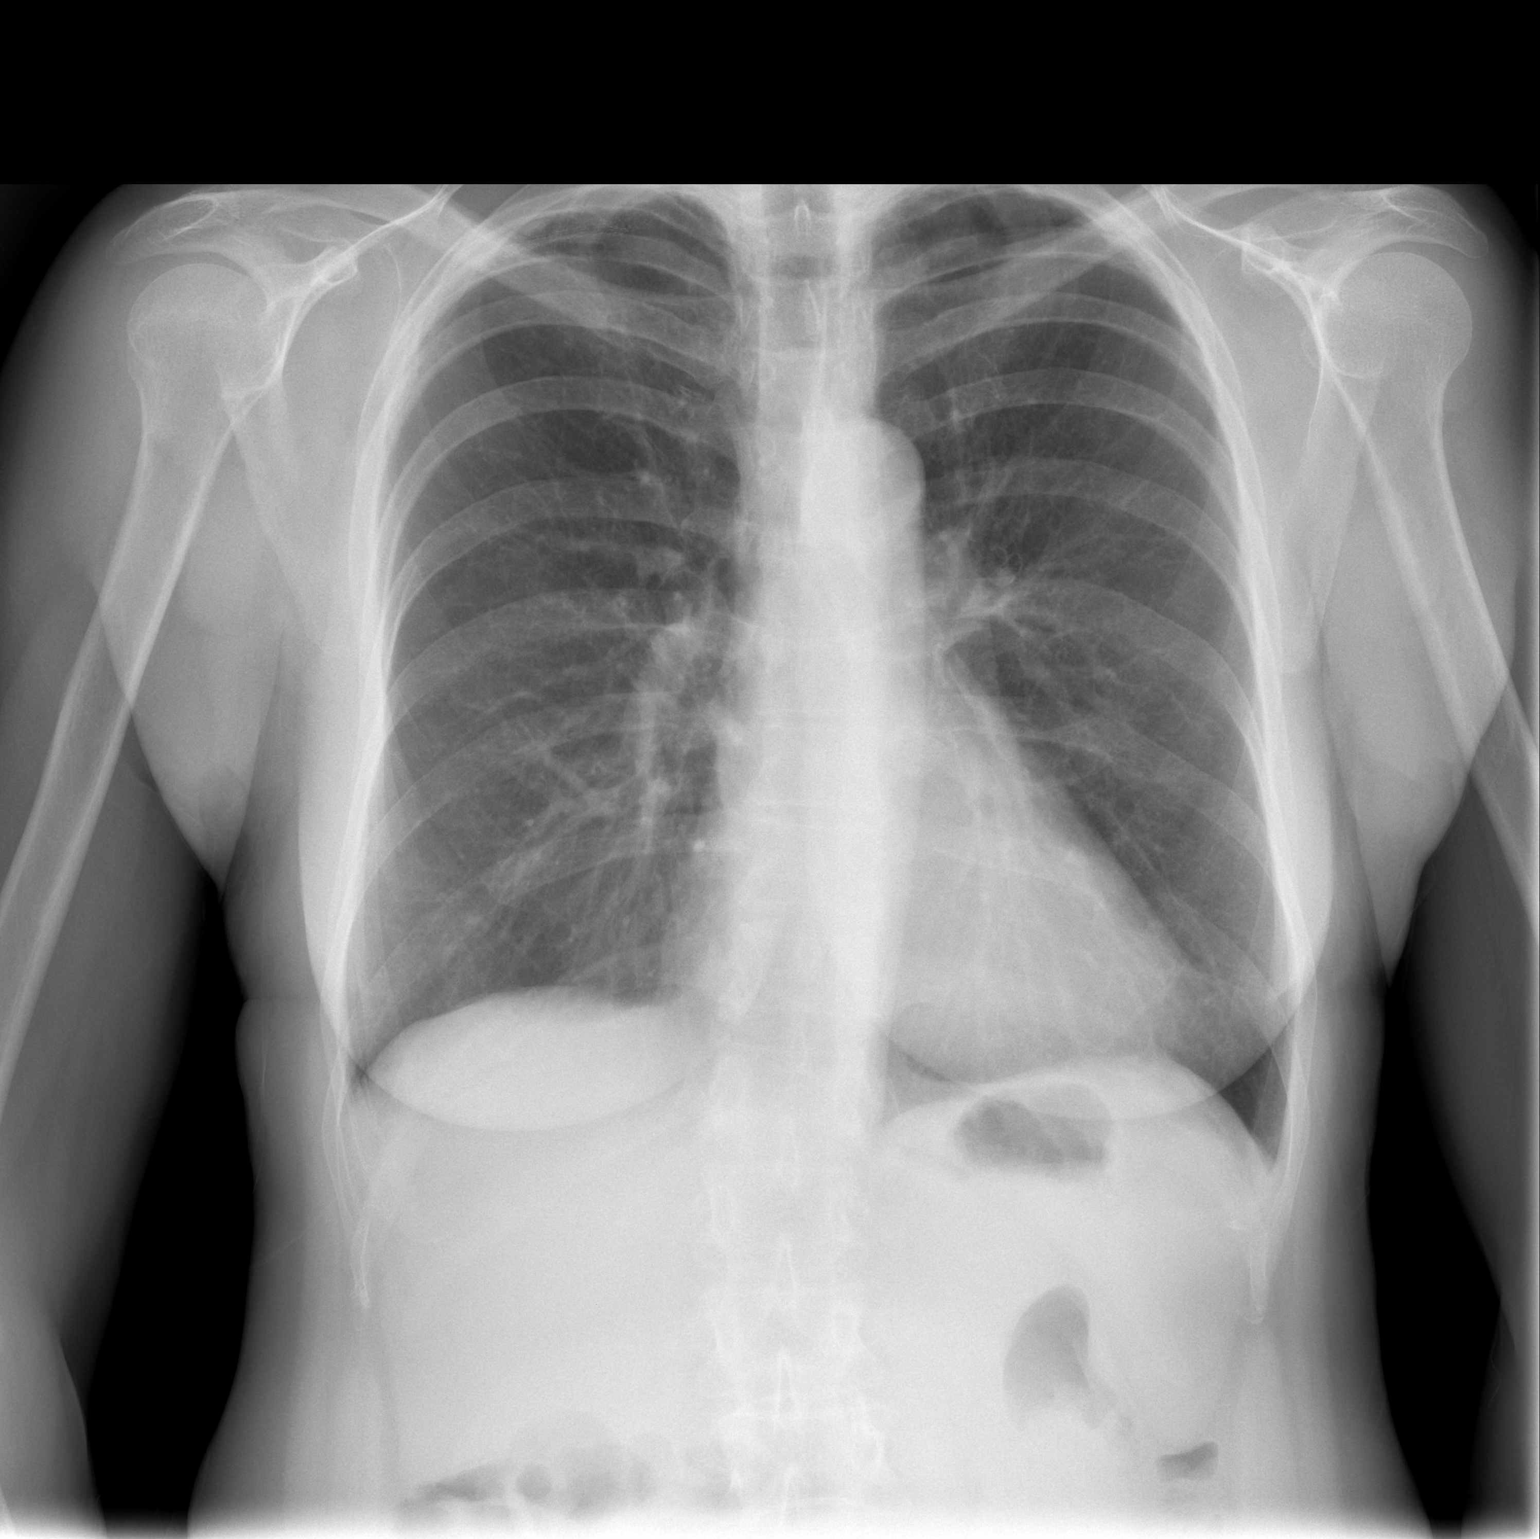

[w chest lat]
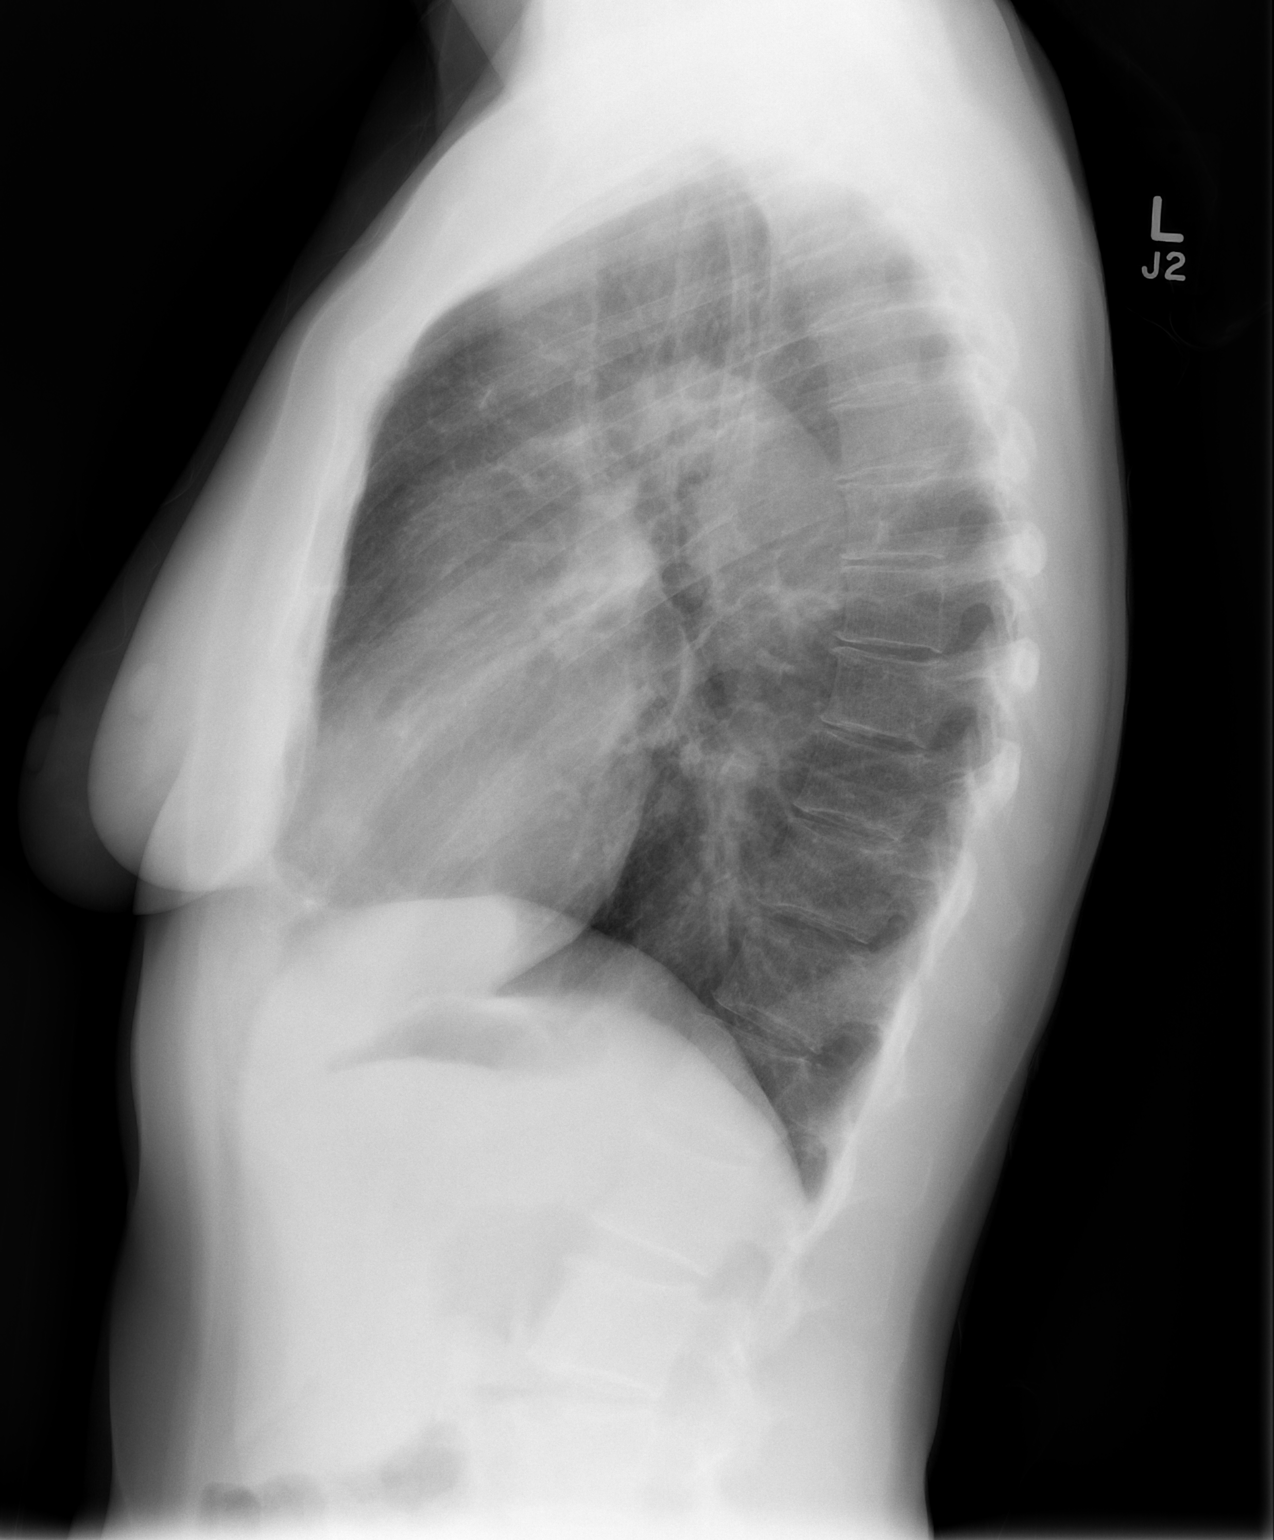

[2 of 2 positions shown; findings below may reference images not displayed]

FINDINGS: Lung volumes are normal. No consolidative airspace disease. No
pleural effusions. No pneumothorax. Multiple tiny high-density
nodules, similar to prior studies, compatible with calcified
granulomas. Calcified right hilar lymph nodes. No other suspicious
appearing pulmonary nodule or mass noted. Pulmonary vasculature and
the cardiomediastinal silhouette are within normal limits. Mild
bilateral apical pleuroparenchymal thickening, likely to represent
post infectious or inflammatory scarring, unchanged.
IMPRESSION: 1. No radiographic evidence of acute cardiopulmonary disease.
Specifically, no pneumothorax identified.
2. Sequela of old granulomatous disease redemonstrated, as above.

## 2015-05-19 LAB — HM MAMMOGRAPHY

## 2015-06-04 ENCOUNTER — Encounter: Payer: Self-pay | Admitting: Family Medicine

## 2015-06-04 NOTE — Telephone Encounter (Signed)
Attempted to contact patient to reschedule appointment scheduled for 08/25/2015 with Dr. Carollee Herter. Sent My Chart message, appointment cancelled and letter mailed 06/04/2015

## 2015-08-25 ENCOUNTER — Encounter: Payer: BC Managed Care – PPO | Admitting: Family Medicine

## 2015-09-05 ENCOUNTER — Encounter: Payer: Self-pay | Admitting: Medical

## 2015-09-05 ENCOUNTER — Other Ambulatory Visit (HOSPITAL_COMMUNITY)
Admission: RE | Admit: 2015-09-05 | Discharge: 2015-09-05 | Disposition: A | Discharge status: Home / Self Care | Payer: BC Managed Care – PPO | Source: Ambulatory Visit | Attending: Medical | Admitting: Medical

## 2015-09-05 ENCOUNTER — Ambulatory Visit (INDEPENDENT_AMBULATORY_CARE_PROVIDER_SITE_OTHER): Payer: BC Managed Care – PPO | Admitting: Medical

## 2015-09-05 VITALS — BP 110/68 | HR 77 | Temp 98.1°F | Ht 63.5 in | Wt 121.0 lb

## 2015-09-05 DIAGNOSIS — Z113 Encounter for screening for infections with a predominantly sexual mode of transmission: Secondary | ICD-10-CM | POA: Insufficient documentation

## 2015-09-05 DIAGNOSIS — N760 Acute vaginitis: Secondary | ICD-10-CM | POA: Insufficient documentation

## 2015-09-05 DIAGNOSIS — N898 Other specified noninflammatory disorders of vagina: Secondary | ICD-10-CM | POA: Diagnosis not present

## 2015-09-05 LAB — POC URINALSYSI DIPSTICK (AUTOMATED)
Bilirubin, UA: NEGATIVE
Glucose, UA: NEGATIVE
KETONES UA: NEGATIVE
Leukocytes, UA: NEGATIVE
Nitrite, UA: NEGATIVE
PH UA: 6.5
PROTEIN UA: NEGATIVE
RBC UA: NEGATIVE
SPEC GRAV UA: 1.02
Urobilinogen, UA: 0.2

## 2015-09-05 MED ORDER — FLUCONAZOLE 150 MG PO TABS: 150.0000 mg | ORAL_TABLET | Freq: Once | ORAL | Status: DC

## 2015-09-05 NOTE — Progress Notes (Signed)
Subjective:    Patient ID: [name], female    DOB: 1955/07/09, 60 y.o.   MRN: [id number]  HPI  Pt in for some recent soreness around vaginal area. Slight burning sensation. Pt tried some aquafor. She also notes some bladder leakage when she coughs. Pt not reporting itching. She has had this for 2 weeks.   Pt has increased her water intake.   Not much pain on urination.  Patient states vaginal area feels little raw.  Pt did treat herself with monistat last weekend. She did not think symptoms got better.   No fever, no chills or sweats.  Pt had no antibiotics recently.  Pt thinks some faint small blisters may be present in her vaginal area. She has been married for years. She mentions no other partners.    Review of Systems  Constitutional: Negative for fever, chills and fatigue.  Genitourinary: Positive for vaginal pain. Negative for dysuria, urgency, frequency, flank pain, vaginal bleeding and vaginal discharge.       Discomfort.  Musculoskeletal: Negative for back pain.  Skin: Negative for rash.   Past Medical History  Diagnosis Date  . Hypertension   . Allergy   . H/O Bell's palsy 05/2010  . Spontaneous pneumothorax 2005    R, s/p tube, then VATS     Social History   Social History  . Marital Status: Married    Spouse Name: N/A  . Number of Children: 3  . Years of Education: N/A   Occupational History  . [employer]   Social History Main Topics  . Smoking status: Former Smoker    Types: Cigarettes    Quit date: 07/11/1982  . Smokeless tobacco: Never Used  . Alcohol Use: 0.0 oz/week    0 Standard drinks or equivalent per week     Comment: rare  . Drug Use: No  . Sexual Activity:    Partners: Male    Birth Control/ Protection: Post-menopausal     Comment: 1st intercourse 60 yo-Fewer than 5 partners   Other Topics Concern  . Not on file   Social History Narrative   Exercise-- jazzersize  3x a week      Past Surgical History  Procedure Laterality Date  . Chest tube insertion  2005    spontaneus Pneumothorax  . Video assisted thoracoscopy  2005  . Cesarean section    . Spirometry  01/20/2015    Dr. Donneta Romberg    Family History  Problem Relation Age of Onset  . Hypertension Mother   . Stroke Mother   . Heart disease Father   . Hypertension Father   . Cancer Maternal Grandmother 72    breast  . Diabetes Maternal Grandmother     No Known Allergies  Current Outpatient Prescriptions on File Prior to Visit  Medication Sig Dispense Refill  . albuterol (PROVENTIL) (2.5 MG/3ML) 0.083% nebulizer solution Take 2.5 mg by nebulization every 6 (six) hours as needed for wheezing or shortness of breath.    . chlorpheniramine (CHLOR-TRIMETON) 4 MG tablet Take 4 mg by mouth 2 (two) times daily as needed for allergies.    . fluticasone (FLONASE) 50 MCG/ACT nasal spray Place 1 spray into both nostrils daily.    Marland Kitchen levocetirizine (XYZAL) 5 MG tablet Take 5 mg by mouth every evening.    . Olopatadine HCl (PATANASE) 0.6 % SOLN Place into the nose.    . sodium chloride (OCEAN) 0.65 % SOLN nasal spray Place 1  spray into both nostrils as needed for congestion.    Marland Kitchen spironolactone (ALDACTONE) 25 MG tablet take 1 tablet by mouth once daily 90 tablet 3  . valsartan (DIOVAN) 160 MG tablet take 1 tablet by mouth once daily 90 tablet 3   No current facility-administered medications on file prior to visit.    BP 110/68 mmHg  Pulse 77  Temp(Src) 98.1 F (36.7 C) (Oral)  Ht 5' 3.5" (1.613 m)  Wt 121 lb (54.885 kg)  BMI 21.10 kg/m2  SpO2 97%      Objective:   Physical Exam  General- No acute distress. Pleasant patient. Neck- Full range of motion, no jvd Lungs- Clear, even and unlabored. Heart- regular rate and rhythm. Neurologic- CNII- XII grossly intact.  Abdomen- soft, nt, nd, +bs, no rebound or guarding.no suprapubic pain.  Back- no cva tenderness.  Vaginal exam- on inspection no  blister. Only faint inflamed red appearance. No ulcerations.      Assessment & Plan:  For your vaginal irritation we are getting urine culture and urine ancillary studies.  You mentions almost blister type appearance when you checked so we did some labs today to evaluate potential cause for blisters.  On exam you had slight inflamed red pearance. So I will rx diflucan for 3 days pending results of the above.  Will contact you on lab results and see how you. Determine if additional treatment needed  Follow up in 7 days or as needed  Hanah Moultry, Percell Miller, [employer]

## 2015-09-05 NOTE — Patient Instructions (Addendum)
For your vaginal irritation we are getting urine culture and urine ancillary studies.  You mentions almost blister type appearance when you checked so we did some labs today to evaluate potential cause for blisters.  On exam you had slight inflamed red appearance. So I will rx diflucan for 3 days pending results of the above.  Will contact you on lab results and see how you. Determine if additional treatment needed  Follow up in 7 days or as needed

## 2015-09-05 NOTE — Addendum Note (Signed)
Addended by: Tasia Catchings on: 09/05/2015 11:55 AM   Modules accepted: Orders, SmartSet

## 2015-09-05 NOTE — Progress Notes (Signed)
Pre visit review using our clinic review tool, if applicable. No additional management support is needed unless otherwise documented below in the visit note./hsm  

## 2015-09-06 LAB — URINE CULTURE

## 2015-09-08 ENCOUNTER — Telehealth: Payer: Self-pay | Admitting: Medical

## 2015-09-08 LAB — HSV(HERPES SMPLX)ABS-I+II(IGG+IGM)-BLD
HERPES SIMPLEX VRS I-IGM AB (EIA): 1.3 {index} — AB
HSV 2 GLYCOPROTEIN G AB, IGG: 9.1 {index} — AB (ref <0.90)

## 2015-09-08 LAB — URINE CYTOLOGY ANCILLARY ONLY
CHLAMYDIA, DNA PROBE: NEGATIVE
Neisseria Gonorrhea: NEGATIVE
TRICH (WINDOWPATH): NEGATIVE

## 2015-09-08 MED ORDER — FAMCICLOVIR 500 MG PO TABS: 500.0000 mg | ORAL_TABLET | Freq: Three times a day (TID) | ORAL | Status: DC

## 2015-09-08 NOTE — Telephone Encounter (Signed)
rx famvir sent to patient pharmacy.

## 2015-09-08 NOTE — Addendum Note (Signed)
Addended by: Tasia Catchings on: 09/08/2015 12:00 PM   Modules accepted: SmartSet

## 2015-09-09 ENCOUNTER — Telehealth: Payer: Self-pay | Admitting: Family Medicine

## 2015-09-09 NOTE — Addendum Note (Signed)
Addended by: Tasia Catchings on: 09/09/2015 09:06 AM   Modules accepted: Miquel Dunn

## 2015-09-09 NOTE — Telephone Encounter (Signed)
Spoke with pt and she voices understanding.  

## 2015-09-09 NOTE — Telephone Encounter (Signed)
Pt returned CMA's call for lab results.  CB: [phone]

## 2015-09-10 LAB — URINE CYTOLOGY ANCILLARY ONLY
BACTERIAL VAGINITIS: NEGATIVE
CANDIDA VAGINITIS: NEGATIVE

## 2015-09-23 ENCOUNTER — Ambulatory Visit (INDEPENDENT_AMBULATORY_CARE_PROVIDER_SITE_OTHER): Payer: BC Managed Care – PPO | Admitting: Family Medicine

## 2015-09-23 ENCOUNTER — Other Ambulatory Visit (HOSPITAL_COMMUNITY)
Admission: RE | Admit: 2015-09-23 | Discharge: 2015-09-23 | Disposition: A | Discharge status: Home / Self Care | Payer: BC Managed Care – PPO | Source: Ambulatory Visit | Attending: Family Medicine | Admitting: Family Medicine

## 2015-09-23 ENCOUNTER — Encounter: Payer: Self-pay | Admitting: Family Medicine

## 2015-09-23 VITALS — BP 130/74 | HR 60 | Temp 98.0°F | Ht 63.0 in | Wt 121.0 lb

## 2015-09-23 DIAGNOSIS — Z124 Encounter for screening for malignant neoplasm of cervix: Secondary | ICD-10-CM

## 2015-09-23 DIAGNOSIS — Z0000 Encounter for general adult medical examination without abnormal findings: Secondary | ICD-10-CM | POA: Diagnosis not present

## 2015-09-23 DIAGNOSIS — Z1151 Encounter for screening for human papillomavirus (HPV): Secondary | ICD-10-CM | POA: Insufficient documentation

## 2015-09-23 DIAGNOSIS — Z01419 Encounter for gynecological examination (general) (routine) without abnormal findings: Secondary | ICD-10-CM | POA: Diagnosis present

## 2015-09-23 DIAGNOSIS — Z1159 Encounter for screening for other viral diseases: Secondary | ICD-10-CM

## 2015-09-23 DIAGNOSIS — I10 Essential (primary) hypertension: Secondary | ICD-10-CM | POA: Diagnosis not present

## 2015-09-23 LAB — POCT URINALYSIS DIPSTICK
BILIRUBIN UA: NEGATIVE
Glucose, UA: NEGATIVE
KETONES UA: NEGATIVE
Leukocytes, UA: NEGATIVE
Nitrite, UA: NEGATIVE
PH UA: 6
Protein, UA: NEGATIVE
RBC UA: NEGATIVE
SPEC GRAV UA: 1.015
Urobilinogen, UA: 0.2

## 2015-09-23 MED ORDER — SPIRONOLACTONE 25 MG PO TABS: ORAL_TABLET | ORAL | 3 refills | Status: DC

## 2015-09-23 MED ORDER — VALSARTAN 160 MG PO TABS: ORAL_TABLET | ORAL | 3 refills | Status: DC

## 2015-09-23 NOTE — Patient Instructions (Signed)
Preventive Care for Adults, Female A healthy lifestyle and preventive care can promote health and wellness. Preventive health guidelines for women include the following key practices.  A routine yearly physical is a good way to check with your health care provider about your health and preventive screening. It is a chance to share any concerns and updates on your health and to receive a thorough exam.  Visit your dentist for a routine exam and preventive care every 6 months. Brush your teeth twice a day and floss once a day. Good oral hygiene prevents tooth decay and gum disease.  The frequency of eye exams is based on your age, health, family medical history, use of contact lenses, and other factors. Follow your health care provider's recommendations for frequency of eye exams.  Eat a healthy diet. Foods like vegetables, fruits, whole grains, low-fat dairy products, and lean protein foods contain the nutrients you need without too many calories. Decrease your intake of foods high in solid fats, added sugars, and salt. Eat the right amount of calories for you.Get information about a proper diet from your health care provider, if necessary.  Regular physical exercise is one of the most important things you can do for your health. Most adults should get at least 150 minutes of moderate-intensity exercise (any activity that increases your heart rate and causes you to sweat) each week. In addition, most adults need muscle-strengthening exercises on 2 or more days a week.  Maintain a healthy weight. The body mass index (BMI) is a screening tool to identify possible weight problems. It provides an estimate of body fat based on height and weight. Your health care provider can find your BMI and can help you achieve or maintain a healthy weight.For adults 20 years and older:  A BMI below 18.5 is considered underweight.  A BMI of 18.5 to 24.9 is normal.  A BMI of 25 to 29.9 is considered overweight.  A  BMI of 30 and above is considered obese.  Maintain normal blood lipids and cholesterol levels by exercising and minimizing your intake of saturated fat. Eat a balanced diet with plenty of fruit and vegetables. Blood tests for lipids and cholesterol should begin at age 45 and be repeated every 5 years. If your lipid or cholesterol levels are high, you are over 50, or you are at high risk for heart disease, you may need your cholesterol levels checked more frequently.Ongoing high lipid and cholesterol levels should be treated with medicines if diet and exercise are not working.  If you smoke, find out from your health care provider how to quit. If you do not use tobacco, do not start.  Lung cancer screening is recommended for adults aged 45-80 years who are at high risk for developing lung cancer because of a history of smoking. A yearly low-dose CT scan of the lungs is recommended for people who have at least a 30-pack-year history of smoking and are a current smoker or have quit within the past 15 years. A pack year of smoking is smoking an average of 1 pack of cigarettes a day for 1 year (for example: 1 pack a day for 30 years or 2 packs a day for 15 years). Yearly screening should continue until the smoker has stopped smoking for at least 15 years. Yearly screening should be stopped for people who develop a health problem that would prevent them from having lung cancer treatment.  If you are pregnant, do not drink alcohol. If you are  breastfeeding, be very cautious about drinking alcohol. If you are not pregnant and choose to drink alcohol, do not have more than 1 drink per day. One drink is considered to be 12 ounces (355 mL) of beer, 5 ounces (148 mL) of wine, or 1.5 ounces (44 mL) of liquor.  Avoid use of street drugs. Do not share needles with anyone. Ask for help if you need support or instructions about stopping the use of drugs.  High blood pressure causes heart disease and increases the risk  of stroke. Your blood pressure should be checked at least every 1 to 2 years. Ongoing high blood pressure should be treated with medicines if weight loss and exercise do not work.  If you are 55-79 years old, ask your health care provider if you should take aspirin to prevent strokes.  Diabetes screening is done by taking a blood sample to check your blood glucose level after you have not eaten for a certain period of time (fasting). If you are not overweight and you do not have risk factors for diabetes, you should be screened once every 3 years starting at age 45. If you are overweight or obese and you are 40-70 years of age, you should be screened for diabetes every year as part of your cardiovascular risk assessment.  Breast cancer screening is essential preventive care for women. You should practice "breast self-awareness." This means understanding the normal appearance and feel of your breasts and may include breast self-examination. Any changes detected, no matter how small, should be reported to a health care provider. Women in their 20s and 30s should have a clinical breast exam (CBE) by a health care provider as part of a regular health exam every 1 to 3 years. After age 40, women should have a CBE every year. Starting at age 40, women should consider having a mammogram (breast X-ray test) every year. Women who have a family history of breast cancer should talk to their health care provider about genetic screening. Women at a high risk of breast cancer should talk to their health care providers about having an MRI and a mammogram every year.  Breast cancer gene (BRCA)-related cancer risk assessment is recommended for women who have family members with BRCA-related cancers. BRCA-related cancers include breast, ovarian, tubal, and peritoneal cancers. Having family members with these cancers may be associated with an increased risk for harmful changes (mutations) in the breast cancer genes BRCA1 and  BRCA2. Results of the assessment will determine the need for genetic counseling and BRCA1 and BRCA2 testing.  Your health care provider may recommend that you be screened regularly for cancer of the pelvic organs (ovaries, uterus, and vagina). This screening involves a pelvic examination, including checking for microscopic changes to the surface of your cervix (Pap test). You may be encouraged to have this screening done every 3 years, beginning at age 21.  For women ages 30-65, health care providers may recommend pelvic exams and Pap testing every 3 years, or they may recommend the Pap and pelvic exam, combined with testing for human papilloma virus (HPV), every 5 years. Some types of HPV increase your risk of cervical cancer. Testing for HPV may also be done on women of any age with unclear Pap test results.  Other health care providers may not recommend any screening for nonpregnant women who are considered low risk for pelvic cancer and who do not have symptoms. Ask your health care provider if a screening pelvic exam is right for   you.  If you have had past treatment for cervical cancer or a condition that could lead to cancer, you need Pap tests and screening for cancer for at least 20 years after your treatment. If Pap tests have been discontinued, your risk factors (such as having a new sexual partner) need to be reassessed to determine if screening should resume. Some women have medical problems that increase the chance of getting cervical cancer. In these cases, your health care provider may recommend more frequent screening and Pap tests.  Colorectal cancer can be detected and often prevented. Most routine colorectal cancer screening begins at the age of 50 years and continues through age 75 years. However, your health care provider may recommend screening at an earlier age if you have risk factors for colon cancer. On a yearly basis, your health care provider may provide home test kits to check  for hidden blood in the stool. Use of a small camera at the end of a tube, to directly examine the colon (sigmoidoscopy or colonoscopy), can detect the earliest forms of colorectal cancer. Talk to your health care provider about this at age 50, when routine screening begins. Direct exam of the colon should be repeated every 5-10 years through age 75 years, unless early forms of precancerous polyps or small growths are found.  People who are at an increased risk for hepatitis B should be screened for this virus. You are considered at high risk for hepatitis B if:  You were born in a country where hepatitis B occurs often. Talk with your health care provider about which countries are considered high risk.  Your parents were born in a high-risk country and you have not received a shot to protect against hepatitis B (hepatitis B vaccine).  You have HIV or AIDS.  You use needles to inject street drugs.  You live with, or have sex with, someone who has hepatitis B.  You get hemodialysis treatment.  You take certain medicines for conditions like cancer, organ transplantation, and autoimmune conditions.  Hepatitis C blood testing is recommended for all people born from 1945 through 1965 and any individual with known risks for hepatitis C.  Practice safe sex. Use condoms and avoid high-risk sexual practices to reduce the spread of sexually transmitted infections (STIs). STIs include gonorrhea, chlamydia, syphilis, trichomonas, herpes, HPV, and human immunodeficiency virus (HIV). Herpes, HIV, and HPV are viral illnesses that have no cure. They can result in disability, cancer, and death.  You should be screened for sexually transmitted illnesses (STIs) including gonorrhea and chlamydia if:  You are sexually active and are younger than 24 years.  You are older than 24 years and your health care provider tells you that you are at risk for this type of infection.  Your sexual activity has changed  since you were last screened and you are at an increased risk for chlamydia or gonorrhea. Ask your health care provider if you are at risk.  If you are at risk of being infected with HIV, it is recommended that you take a prescription medicine daily to prevent HIV infection. This is called preexposure prophylaxis (PrEP). You are considered at risk if:  You are sexually active and do not regularly use condoms or know the HIV status of your partner(s).  You take drugs by injection.  You are sexually active with a partner who has HIV.  Talk with your health care provider about whether you are at high risk of being infected with HIV. If   you choose to begin PrEP, you should first be tested for HIV. You should then be tested every 3 months for as long as you are taking PrEP.  Osteoporosis is a disease in which the bones lose minerals and strength with aging. This can result in serious bone fractures or breaks. The risk of osteoporosis can be identified using a bone density scan. Women ages 67 years and over and women at risk for fractures or osteoporosis should discuss screening with their health care providers. Ask your health care provider whether you should take a calcium supplement or vitamin D to reduce the rate of osteoporosis.  Menopause can be associated with physical symptoms and risks. Hormone replacement therapy is available to decrease symptoms and risks. You should talk to your health care provider about whether hormone replacement therapy is right for you.  Use sunscreen. Apply sunscreen liberally and repeatedly throughout the day. You should seek shade when your shadow is shorter than you. Protect yourself by wearing long sleeves, pants, a wide-brimmed hat, and sunglasses year round, whenever you are outdoors.  Once a month, do a whole body skin exam, using a mirror to look at the skin on your back. Tell your health care provider of new moles, moles that have irregular borders, moles that  are larger than a pencil eraser, or moles that have changed in shape or color.  Stay current with required vaccines (immunizations).  Influenza vaccine. All adults should be immunized every year.  Tetanus, diphtheria, and acellular pertussis (Td, Tdap) vaccine. Pregnant women should receive 1 dose of Tdap vaccine during each pregnancy. The dose should be obtained regardless of the length of time since the last dose. Immunization is preferred during the 27th-36th week of gestation. An adult who has not previously received Tdap or who does not know her vaccine status should receive 1 dose of Tdap. This initial dose should be followed by tetanus and diphtheria toxoids (Td) booster doses every 10 years. Adults with an unknown or incomplete history of completing a 3-dose immunization series with Td-containing vaccines should begin or complete a primary immunization series including a Tdap dose. Adults should receive a Td booster every 10 years.  Varicella vaccine. An adult without evidence of immunity to varicella should receive 2 doses or a second dose if she has previously received 1 dose. Pregnant females who do not have evidence of immunity should receive the first dose after pregnancy. This first dose should be obtained before leaving the health care facility. The second dose should be obtained 4-8 weeks after the first dose.  Human papillomavirus (HPV) vaccine. Females aged 13-26 years who have not received the vaccine previously should obtain the 3-dose series. The vaccine is not recommended for use in pregnant females. However, pregnancy testing is not needed before receiving a dose. If a female is found to be pregnant after receiving a dose, no treatment is needed. In that case, the remaining doses should be delayed until after the pregnancy. Immunization is recommended for any person with an immunocompromised condition through the age of 61 years if she did not get any or all doses earlier. During the  3-dose series, the second dose should be obtained 4-8 weeks after the first dose. The third dose should be obtained 24 weeks after the first dose and 16 weeks after the second dose.  Zoster vaccine. One dose is recommended for adults aged 30 years or older unless certain conditions are present.  Measles, mumps, and rubella (MMR) vaccine. Adults born  before 1957 generally are considered immune to measles and mumps. Adults born in 1957 or later should have 1 or more doses of MMR vaccine unless there is a contraindication to the vaccine or there is laboratory evidence of immunity to each of the three diseases. A routine second dose of MMR vaccine should be obtained at least 28 days after the first dose for students attending postsecondary schools, health care workers, or international travelers. People who received inactivated measles vaccine or an unknown type of measles vaccine during 1963-1967 should receive 2 doses of MMR vaccine. People who received inactivated mumps vaccine or an unknown type of mumps vaccine before 1979 and are at high risk for mumps infection should consider immunization with 2 doses of MMR vaccine. For females of childbearing age, rubella immunity should be determined. If there is no evidence of immunity, females who are not pregnant should be vaccinated. If there is no evidence of immunity, females who are pregnant should delay immunization until after pregnancy. Unvaccinated health care workers born before 1957 who lack laboratory evidence of measles, mumps, or rubella immunity or laboratory confirmation of disease should consider measles and mumps immunization with 2 doses of MMR vaccine or rubella immunization with 1 dose of MMR vaccine.  Pneumococcal 13-valent conjugate (PCV13) vaccine. When indicated, a person who is uncertain of his immunization history and has no record of immunization should receive the PCV13 vaccine. All adults 65 years of age and older should receive this  vaccine. An adult aged 19 years or older who has certain medical conditions and has not been previously immunized should receive 1 dose of PCV13 vaccine. This PCV13 should be followed with a dose of pneumococcal polysaccharide (PPSV23) vaccine. Adults who are at high risk for pneumococcal disease should obtain the PPSV23 vaccine at least 8 weeks after the dose of PCV13 vaccine. Adults older than 62 years of age who have normal immune system function should obtain the PPSV23 vaccine dose at least 1 year after the dose of PCV13 vaccine.  Pneumococcal polysaccharide (PPSV23) vaccine. When PCV13 is also indicated, PCV13 should be obtained first. All adults aged 65 years and older should be immunized. An adult younger than age 65 years who has certain medical conditions should be immunized. Any person who resides in a nursing home or long-term care facility should be immunized. An adult smoker should be immunized. People with an immunocompromised condition and certain other conditions should receive both PCV13 and PPSV23 vaccines. People with human immunodeficiency virus (HIV) infection should be immunized as soon as possible after diagnosis. Immunization during chemotherapy or radiation therapy should be avoided. Routine use of PPSV23 vaccine is not recommended for American Indians, [place] Natives, or people younger than 65 years unless there are medical conditions that require PPSV23 vaccine. When indicated, people who have unknown immunization and have no record of immunization should receive PPSV23 vaccine. One-time revaccination 5 years after the first dose of PPSV23 is recommended for people aged 19-64 years who have chronic kidney failure, nephrotic syndrome, asplenia, or immunocompromised conditions. People who received 1-2 doses of PPSV23 before age 65 years should receive another dose of PPSV23 vaccine at age 65 years or later if at least 5 years have passed since the previous dose. Doses of PPSV23 are not  needed for people immunized with PPSV23 at or after age 65 years.  Meningococcal vaccine. Adults with asplenia or persistent complement component deficiencies should receive 2 doses of quadrivalent meningococcal conjugate (MenACWY-D) vaccine. The doses should be obtained   at least 2 months apart. Microbiologists working with certain meningococcal bacteria, [place] recruits, people at risk during an outbreak, and people who travel to or live in countries with a high rate of meningitis should be immunized. A first-year college student up through age 34 years who is living in a residence hall should receive a dose if she did not receive a dose on or after her 16th birthday. Adults who have certain high-risk conditions should receive one or more doses of vaccine.  Hepatitis A vaccine. Adults who wish to be protected from this disease, have certain high-risk conditions, work with hepatitis A-infected animals, work in hepatitis A research labs, or travel to or work in countries with a high rate of hepatitis A should be immunized. Adults who were previously unvaccinated and who anticipate close contact with an international adoptee during the first 60 days after arrival in the [place] from a country with a high rate of hepatitis A should be immunized.  Hepatitis B vaccine. Adults who wish to be protected from this disease, have certain high-risk conditions, may be exposed to blood or other infectious body fluids, are household contacts or sex partners of hepatitis B positive people, are clients or workers in certain care facilities, or travel to or work in countries with a high rate of hepatitis B should be immunized.  Haemophilus influenzae type b (Hib) vaccine. A previously unvaccinated person with asplenia or sickle cell disease or having a scheduled splenectomy should receive 1 dose of Hib vaccine. Regardless of previous immunization, a recipient of a hematopoietic stem cell transplant should receive a  3-dose series 6-12 months after her successful transplant. Hib vaccine is not recommended for adults with HIV infection. Preventive Services / Frequency Ages 35 to 4 years  Blood pressure check.** / Every 3-5 years.  Lipid and cholesterol check.** / Every 5 years beginning at age 60.  Clinical breast exam.** / Every 3 years for women in their 71s and 10s.  BRCA-related cancer risk assessment.** / For women who have family members with a BRCA-related cancer (breast, ovarian, tubal, or peritoneal cancers).  Pap test.** / Every 2 years from ages 76 through 26. Every 3 years starting at age 61 through age 76 or 93 with a history of 3 consecutive normal Pap tests.  HPV screening.** / Every 3 years from ages 37 through ages 60 to 51 with a history of 3 consecutive normal Pap tests.  Hepatitis C blood test.** / For any individual with known risks for hepatitis C.  Skin self-exam. / Monthly.  Influenza vaccine. / Every year.  Tetanus, diphtheria, and acellular pertussis (Tdap, Td) vaccine.** / Consult your health care provider. Pregnant women should receive 1 dose of Tdap vaccine during each pregnancy. 1 dose of Td every 10 years.  Varicella vaccine.** / Consult your health care provider. Pregnant females who do not have evidence of immunity should receive the first dose after pregnancy.  HPV vaccine. / 3 doses over 6 months, if 93 and younger. The vaccine is not recommended for use in pregnant females. However, pregnancy testing is not needed before receiving a dose.  Measles, mumps, rubella (MMR) vaccine.** / You need at least 1 dose of MMR if you were born in 1957 or later. You may also need a 2nd dose. For females of childbearing age, rubella immunity should be determined. If there is no evidence of immunity, females who are not pregnant should be vaccinated. If there is no evidence of immunity, females who are  pregnant should delay immunization until after pregnancy.  Pneumococcal  13-valent conjugate (PCV13) vaccine.** / Consult your health care provider.  Pneumococcal polysaccharide (PPSV23) vaccine.** / 1 to 2 doses if you smoke cigarettes or if you have certain conditions.  Meningococcal vaccine.** / 1 dose if you are age 68 to 8 years and a Market researcher living in a residence hall, or have one of several medical conditions, you need to get vaccinated against meningococcal disease. You may also need additional booster doses.  Hepatitis A vaccine.** / Consult your health care provider.  Hepatitis B vaccine.** / Consult your health care provider.  Haemophilus influenzae type b (Hib) vaccine.** / Consult your health care provider. Ages 7 to 53 years  Blood pressure check.** / Every year.  Lipid and cholesterol check.** / Every 5 years beginning at age 25 years.  Lung cancer screening. / Every year if you are aged 11-80 years and have a 30-pack-year history of smoking and currently smoke or have quit within the past 15 years. Yearly screening is stopped once you have quit smoking for at least 15 years or develop a health problem that would prevent you from having lung cancer treatment.  Clinical breast exam.** / Every year after age 48 years.  BRCA-related cancer risk assessment.** / For women who have family members with a BRCA-related cancer (breast, ovarian, tubal, or peritoneal cancers).  Mammogram.** / Every year beginning at age 41 years and continuing for as long as you are in good health. Consult with your health care provider.  Pap test.** / Every 3 years starting at age 65 years through age 37 or 70 years with a history of 3 consecutive normal Pap tests.  HPV screening.** / Every 3 years from ages 72 years through ages 60 to 40 years with a history of 3 consecutive normal Pap tests.  Fecal occult blood test (FOBT) of stool. / Every year beginning at age 21 years and continuing until age 5 years. You may not need to do this test if you get  a colonoscopy every 10 years.  Flexible sigmoidoscopy or colonoscopy.** / Every 5 years for a flexible sigmoidoscopy or every 10 years for a colonoscopy beginning at age 35 years and continuing until age 48 years.  Hepatitis C blood test.** / For all people born from [birth year] through 1965 and any individual with known risks for hepatitis C.  Skin self-exam. / Monthly.  Influenza vaccine. / Every year.  Tetanus, diphtheria, and acellular pertussis (Tdap/Td) vaccine.** / Consult your health care provider. Pregnant women should receive 1 dose of Tdap vaccine during each pregnancy. 1 dose of Td every 10 years.  Varicella vaccine.** / Consult your health care provider. Pregnant females who do not have evidence of immunity should receive the first dose after pregnancy.  Zoster vaccine.** / 1 dose for adults aged 30 years or older.  Measles, mumps, rubella (MMR) vaccine.** / You need at least 1 dose of MMR if you were born in 1957 or later. You may also need a second dose. For females of childbearing age, rubella immunity should be determined. If there is no evidence of immunity, females who are not pregnant should be vaccinated. If there is no evidence of immunity, females who are pregnant should delay immunization until after pregnancy.  Pneumococcal 13-valent conjugate (PCV13) vaccine.** / Consult your health care provider.  Pneumococcal polysaccharide (PPSV23) vaccine.** / 1 to 2 doses if you smoke cigarettes or if you have certain conditions.  Meningococcal vaccine.** /  Consult your health care provider.  Hepatitis A vaccine.** / Consult your health care provider.  Hepatitis B vaccine.** / Consult your health care provider.  Haemophilus influenzae type b (Hib) vaccine.** / Consult your health care provider. Ages 64 years and over  Blood pressure check.** / Every year.  Lipid and cholesterol check.** / Every 5 years beginning at age 23 years.  Lung cancer screening. / Every year if you  are aged 16-80 years and have a 30-pack-year history of smoking and currently smoke or have quit within the past 15 years. Yearly screening is stopped once you have quit smoking for at least 15 years or develop a health problem that would prevent you from having lung cancer treatment.  Clinical breast exam.** / Every year after age 74 years.  BRCA-related cancer risk assessment.** / For women who have family members with a BRCA-related cancer (breast, ovarian, tubal, or peritoneal cancers).  Mammogram.** / Every year beginning at age 44 years and continuing for as long as you are in good health. Consult with your health care provider.  Pap test.** / Every 3 years starting at age 58 years through age 22 or 39 years with 3 consecutive normal Pap tests. Testing can be stopped between 65 and 70 years with 3 consecutive normal Pap tests and no abnormal Pap or HPV tests in the past 10 years.  HPV screening.** / Every 3 years from ages 64 years through ages 70 or 61 years with a history of 3 consecutive normal Pap tests. Testing can be stopped between 65 and 70 years with 3 consecutive normal Pap tests and no abnormal Pap or HPV tests in the past 10 years.  Fecal occult blood test (FOBT) of stool. / Every year beginning at age 40 years and continuing until age 27 years. You may not need to do this test if you get a colonoscopy every 10 years.  Flexible sigmoidoscopy or colonoscopy.** / Every 5 years for a flexible sigmoidoscopy or every 10 years for a colonoscopy beginning at age 7 years and continuing until age 32 years.  Hepatitis C blood test.** / For all people born from [birth year] through 1967 and any individual with known risks for hepatitis C.  Osteoporosis screening.** / A one-time screening for women ages 30 years and over and women at risk for fractures or osteoporosis.  Skin self-exam. / Monthly.  Influenza vaccine. / Every year.  Tetanus, diphtheria, and acellular pertussis (Tdap/Td)  vaccine.** / 1 dose of Td every 10 years.  Varicella vaccine.** / Consult your health care provider.  Zoster vaccine.** / 1 dose for adults aged 35 years or older.  Pneumococcal 13-valent conjugate (PCV13) vaccine.** / Consult your health care provider.  Pneumococcal polysaccharide (PPSV23) vaccine.** / 1 dose for all adults aged 46 years and older.  Meningococcal vaccine.** / Consult your health care provider.  Hepatitis A vaccine.** / Consult your health care provider.  Hepatitis B vaccine.** / Consult your health care provider.  Haemophilus influenzae type b (Hib) vaccine.** / Consult your health care provider. ** Family history and personal history of risk and conditions may change your health care provider's recommendations.   This information is not intended to replace advice given to you by your health care provider. Make sure you discuss any questions you have with your health care provider.   Document Released: 04/06/2001 Document Revised: 03/01/2014 Document Reviewed: 07/06/2010 Elsevier Interactive Patient Education [employer].

## 2015-09-23 NOTE — Progress Notes (Signed)
Pre visit review using our clinic review tool, if applicable. No additional management support is needed unless otherwise documented below in the visit note. 

## 2015-09-23 NOTE — Progress Notes (Signed)
Subjective:     [name] is a 60 y.o. female and is here for a comprehensive physical exam. The patient reports no problems.  Social History   Social History  . Marital status: Married    Spouse name: N/A  . Number of children: 3  . Years of education: N/A   Occupational History  . [employer]   Social History Main Topics  . Smoking status: Former Smoker    Types: Cigarettes    Quit date: 07/11/1982  . Smokeless tobacco: Never Used  . Alcohol use 0.0 oz/week     Comment: rare  . Drug use: No  . Sexual activity: Yes    Partners: Male    Birth control/ protection: Post-menopausal     Comment: 1st intercourse 60 yo-Fewer than 5 partners   Other Topics Concern  . Not on file   Social History Narrative   Exercise-- jazzersize  3x a week    Health Maintenance  Topic Date Due  . Hepatitis C Screening  05-15-1955  . INFLUENZA VACCINE  11/23/2015 (Originally 09/23/2015)  . HIV Screening  09/22/2016 (Originally 03/31/1968)  . MAMMOGRAM  05/18/2017  . PAP SMEAR  05/21/2017  . COLONOSCOPY  06/06/2017  . TETANUS/TDAP  04/18/2018  . ZOSTAVAX  Completed    The following portions of the patient's history were reviewed and updated as appropriate: She  has a past medical history of Allergy; H/O Bell's palsy (05/2010); Hypertension; and Spontaneous pneumothorax (2005). She  does not have any pertinent problems on file. She  has a past surgical history that includes Chest tube insertion (2005); Video assisted thoracoscopy (2005); Cesarean section; and spirometry (01/20/2015). Her family history includes Cancer (age of onset: 81) in her maternal grandmother; Diabetes in her maternal grandmother; Heart disease in her father; Hypertension in her father and mother; Stroke in her mother. She  reports that she quit smoking about 33 years ago. Her smoking use included Cigarettes. She has never used smokeless tobacco. She reports that she drinks  alcohol. She reports that she does not use drugs. She has a current medication list which includes the following prescription(s): albuterol, chlorpheniramine, famciclovir, fluticasone, levocetirizine, olopatadine hcl, sodium chloride, spironolactone, and valsartan. Current Outpatient Prescriptions on File Prior to Visit  Medication Sig Dispense Refill  . albuterol (PROVENTIL) (2.5 MG/3ML) 0.083% nebulizer solution Take 2.5 mg by nebulization every 6 (six) hours as needed for wheezing or shortness of breath.    . chlorpheniramine (CHLOR-TRIMETON) 4 MG tablet Take 4 mg by mouth 2 (two) times daily as needed for allergies.    . famciclovir (FAMVIR) 500 MG tablet Take 1 tablet (500 mg total) by mouth 3 (three) times daily. 21 tablet 0  . fluticasone (FLONASE) 50 MCG/ACT nasal spray Place 1 spray into both nostrils daily.    Marland Kitchen levocetirizine (XYZAL) 5 MG tablet Take 5 mg by mouth every evening.    . Olopatadine HCl (PATANASE) 0.6 % SOLN Place into the nose.    . sodium chloride (OCEAN) 0.65 % SOLN nasal spray Place 1 spray into both nostrils as needed for congestion.     No current facility-administered medications on file prior to visit.    She has No Known Allergies..  Review of Systems Review of Systems  Constitutional: Negative for activity change, appetite change and fatigue.  HENT: Negative for hearing loss, congestion, tinnitus and ear discharge.  dentist q36m Eyes: Negative for visual disturbance (see optho q1y -- vision corrected to 20/20  with glasses).  Respiratory: Negative for cough, chest tightness and shortness of breath.   Cardiovascular: Negative for chest pain, palpitations and leg swelling.  Gastrointestinal: Negative for abdominal pain, diarrhea, constipation and abdominal distention.  Genitourinary: Negative for urgency, frequency, decreased urine volume and difficulty urinating.  Musculoskeletal: Negative for back pain, arthralgias and gait problem.  Skin: Negative for color  change, pallor and rash.  Neurological: Negative for dizziness, light-headedness, numbness and headaches.  Hematological: Negative for adenopathy. Does not bruise/bleed easily.  Psychiatric/Behavioral: Negative for suicidal ideas, confusion, sleep disturbance, self-injury, dysphoric mood, decreased concentration and agitation.       Objective:    BP 130/74 (BP Location: Left Arm, Patient Position: Sitting, Cuff Size: Small)   Pulse 60   Temp 98 F (36.7 C) (Oral)   Ht 5\' 3"  (1.6 m)   Wt 121 lb (54.9 kg)   SpO2 99%   BMI 21.43 kg/m  General appearance: alert, cooperative, appears stated age and no distress Head: Normocephalic, without obvious abnormality, atraumatic Eyes: conjunctivae/corneas clear. PERRL, EOM's intact. Fundi benign. Ears: normal TM's and external ear canals both ears Nose: Nares normal. Septum midline. Mucosa normal. No drainage or sinus tenderness. Throat: lips, mucosa, and tongue normal; teeth and gums normal Neck: no adenopathy, no carotid bruit, no JVD, supple, symmetrical, trachea midline and thyroid not enlarged, symmetric, no tenderness/mass/nodules Back: symmetric, no curvature. ROM normal. No CVA tenderness. Lungs: clear to auscultation bilaterally Breasts: normal appearance, no masses or tenderness Heart: regular rate and rhythm, S1, S2 normal, no murmur, click, rub or gallop Abdomen: soft, non-tender; bowel sounds normal; no masses,  no organomegaly Pelvic: cervix normal in appearance, external genitalia normal, no adnexal masses or tenderness, no cervical motion tenderness, rectovaginal septum normal, uterus normal size, shape, and consistency, vagina normal without discharge and pap done, rectal heme neg brown stool Extremities: extremities normal, atraumatic, no cyanosis or edema Pulses: 2+ and symmetric Skin: Skin color, texture, turgor normal. No rashes or lesions Lymph nodes: Cervical, supraclavicular, and axillary nodes normal. Neurologic: Alert  and oriented X 3, normal strength and tone. Normal symmetric reflexes. Normal coordination and gait Psych- no depression, no anxiety     Assessment:    Healthy female exam.      Plan:    ghm utd Check labs See After Visit Summary for Counseling Recommendations    1. Screening for malignant neoplasm of cervix   - Cytology - PAP  2. Preventative health care See above - Cytology - PAP - Comprehensive metabolic panel - CBC with Differential/Platelet - Lipid panel - POCT urinalysis dipstick - TSH  3. Need for hepatitis C screening test   - Hepatitis C Antibody  4. Essential hypertension stable - spironolactone (ALDACTONE) 25 MG tablet; take 1 tablet by mouth once daily  Dispense: 90 tablet; Refill: 3 - valsartan (DIOVAN) 160 MG tablet; take 1 tablet by mouth once daily  Dispense: 90 tablet; Refill: 3

## 2015-09-24 LAB — COMPREHENSIVE METABOLIC PANEL
ALK PHOS: 92 U/L (ref 39–117)
ALT: 15 U/L (ref 0–35)
AST: 14 U/L (ref 0–37)
Albumin: 4.6 g/dL (ref 3.5–5.2)
BUN: 7 mg/dL (ref 6–23)
CO2: 27 mEq/L (ref 19–32)
Calcium: 9.5 mg/dL (ref 8.4–10.5)
Chloride: 99 mEq/L (ref 96–112)
Creatinine, Ser: 0.62 mg/dL (ref 0.40–1.20)
GFR: 103.5 mL/min (ref >60.00)
GLUCOSE: 91 mg/dL (ref 70–99)
Potassium: 3.6 mEq/L (ref 3.5–5.1)
SODIUM: 137 meq/L (ref 135–145)
TOTAL PROTEIN: 7.5 g/dL (ref 6.0–8.3)
Total Bilirubin: 0.6 mg/dL (ref 0.2–1.2)

## 2015-09-24 LAB — CBC WITH DIFFERENTIAL/PLATELET
Basophils Absolute: 0 10*3/uL (ref 0.0–0.1)
Basophils Relative: 0.2 % (ref 0.0–3.0)
EOS PCT: 1.3 % (ref 0.0–5.0)
Eosinophils Absolute: 0.1 10*3/uL (ref 0.0–0.7)
HEMATOCRIT: 37.9 % (ref 36.0–46.0)
HEMOGLOBIN: 12.9 g/dL (ref 12.0–15.0)
LYMPHS ABS: 1.7 10*3/uL (ref 0.7–4.0)
LYMPHS PCT: 24.2 % (ref 12.0–46.0)
MCHC: 34.1 g/dL (ref 30.0–36.0)
MCV: 87.4 fl (ref 78.0–100.0)
Monocytes Absolute: 0.2 10*3/uL (ref 0.1–1.0)
Monocytes Relative: 3.2 % (ref 3.0–12.0)
Neutro Abs: 5 10*3/uL (ref 1.4–7.7)
Neutrophils Relative %: 71.1 % (ref 43.0–77.0)
Platelets: 237 10*3/uL (ref 150.0–400.0)
RBC: 4.34 Mil/uL (ref 3.87–5.11)
RDW: 13.1 % (ref 11.5–15.5)
WBC: 7.1 10*3/uL (ref 4.0–10.5)

## 2015-09-24 LAB — TSH: TSH: 0.51 u[IU]/mL (ref 0.35–4.50)

## 2015-09-24 LAB — LIPID PANEL
Cholesterol: 176 mg/dL (ref 0–200)
HDL: 75.9 mg/dL (ref >39.00)
LDL CALC: 88 mg/dL (ref 0–99)
NONHDL: 100.01
Total CHOL/HDL Ratio: 2
Triglycerides: 58 mg/dL (ref 0.0–149.0)
VLDL: 11.6 mg/dL (ref 0.0–40.0)

## 2015-09-24 LAB — HEPATITIS C ANTIBODY: HCV AB: NEGATIVE

## 2015-09-26 LAB — CYTOLOGY - PAP

## 2015-10-10 ENCOUNTER — Other Ambulatory Visit (INDEPENDENT_AMBULATORY_CARE_PROVIDER_SITE_OTHER): Payer: BC Managed Care – PPO

## 2015-10-10 DIAGNOSIS — Z1159 Encounter for screening for other viral diseases: Secondary | ICD-10-CM

## 2015-10-10 DIAGNOSIS — I10 Essential (primary) hypertension: Secondary | ICD-10-CM

## 2015-10-10 DIAGNOSIS — Z0000 Encounter for general adult medical examination without abnormal findings: Secondary | ICD-10-CM

## 2015-10-10 DIAGNOSIS — Z124 Encounter for screening for malignant neoplasm of cervix: Secondary | ICD-10-CM

## 2015-10-10 LAB — FECAL OCCULT BLOOD, IMMUNOCHEMICAL: Fecal Occult Bld: NEGATIVE

## 2015-10-10 NOTE — Addendum Note (Signed)
Addended by: Peggyann Shoals on: 10/10/2015 04:22 PM   Modules accepted: Orders

## 2016-01-24 ENCOUNTER — Ambulatory Visit (INDEPENDENT_AMBULATORY_CARE_PROVIDER_SITE_OTHER): Payer: BC Managed Care – PPO | Admitting: Physician Assistant

## 2016-01-24 VITALS — BP 128/84 | HR 79 | Temp 98.7°F | Resp 17 | Ht 63.0 in | Wt 120.0 lb

## 2016-01-24 DIAGNOSIS — R0789 Other chest pain: Secondary | ICD-10-CM

## 2016-01-24 MED ORDER — GUAIFENESIN ER 1200 MG PO TB12: 1.0000 | ORAL_TABLET | Freq: Two times a day (BID) | ORAL | 1 refills | Status: DC | PRN

## 2016-01-24 MED ORDER — BENZONATATE 100 MG PO CAPS: 100.0000 mg | ORAL_CAPSULE | Freq: Three times a day (TID) | ORAL | 0 refills | Status: DC | PRN

## 2016-01-24 MED ORDER — PREDNISONE 20 MG PO TABS: ORAL_TABLET | ORAL | 0 refills | Status: DC

## 2016-01-24 NOTE — Patient Instructions (Addendum)
Bronchospasm, Adult A bronchospasm is a spasm or tightening of the airways going into the lungs. During a bronchospasm breathing becomes more difficult because the airways get smaller. When this happens there can be coughing, a whistling sound when breathing (wheezing), and difficulty breathing. Bronchospasm is often associated with asthma, but not all patients who experience a bronchospasm have asthma. What are the causes? A bronchospasm is caused by inflammation or irritation of the airways. The inflammation or irritation may be triggered by:  Allergies (such as to animals, pollen, food, or mold). Allergens that cause bronchospasm may cause wheezing immediately after exposure or many hours later.  Infection. Viral infections are believed to be the most common cause of bronchospasm.  Exercise.  Irritants (such as pollution, cigarette smoke, strong odors, aerosol sprays, and paint fumes).  Weather changes. Winds increase molds and pollens in the air. Rain refreshes the air by washing irritants out. Cold air may cause inflammation.  Stress and emotional upset. What are the signs or symptoms?  Wheezing.  Excessive nighttime coughing.  Frequent or severe coughing with a simple cold.  Chest tightness.  Shortness of breath. How is this diagnosed? Bronchospasm is usually diagnosed through a history and physical exam. Tests, such as chest X-rays, are sometimes done to look for other conditions. How is this treated?  Inhaled medicines can be given to open up your airways and help you breathe. The medicines can be given using either an inhaler or a nebulizer machine.  Corticosteroid medicines may be given for severe bronchospasm, usually when it is associated with asthma. Follow these instructions at home:  Always have a plan prepared for seeking medical care. Know when to call your health care provider and local emergency services (911 in the [place] Know where you can access local  emergency care.  Only take medicines as directed by your health care provider.  If you were prescribed an inhaler or nebulizer machine, ask your health care provider to explain how to use it correctly. Always use a spacer with your inhaler if you were given one.  It is necessary to remain calm during an attack. Try to relax and breathe more slowly.  Control your home environment in the following ways:  Change your heating and air conditioning filter at least once a month.  Limit your use of fireplaces and wood stoves.  Do not smoke and do not allow smoking in your home.  Avoid exposure to perfumes and fragrances.  Get rid of pests (such as roaches and mice) and their droppings.  Throw away plants if you see mold on them.  Keep your house clean and dust free.  Replace carpet with wood, tile, or vinyl flooring. Carpet can trap dander and dust.  Use allergy-proof pillows, mattress covers, and box spring covers.  Wash bed sheets and blankets every week in hot water and dry them in a dryer.  Use blankets that are made of polyester or cotton.  Wash hands frequently. Contact a health care provider if:  You have muscle aches.  You have chest pain.  The sputum changes from clear or white to yellow, green, gray, or bloody.  The sputum you cough up gets thicker.  There are problems that may be related to the medicine you are given, such as a rash, itching, swelling, or trouble breathing. Get help right away if:  You have worsening wheezing and coughing even after taking your prescribed medicines.  You have increased difficulty breathing.  You develop severe chest pain.  This information is not intended to replace advice given to you by your health care provider. Make sure you discuss any questions you have with your health care provider. Document Released: 02/11/2003 Document Revised: 07/17/2015 Document Reviewed: 07/31/2012 Elsevier Interactive Patient Education  2017  [employer].    IF you received an x-ray today, you will receive an invoice from [facility]. Please contact [facility] at [phone] with questions or concerns regarding your invoice.   IF you received labwork today, you will receive an invoice from [employer]. Please contact [facility] at [phone] with questions or concerns regarding your invoice.   Our billing staff will not be able to assist you with questions regarding bills from these companies.  You will be contacted with the lab results as soon as they are available. The fastest way to get your results is to activate your My Chart account. Instructions are located on the last page of this paperwork. If you have not heard from [place] regarding the results in 2 weeks, please contact this office.

## 2016-01-24 NOTE — Progress Notes (Signed)
[facility] [address] [place] [address] [phone]  Date:  01/24/2016   Name:  [name]   DOB:  08-Sep-1955   MRN:  [id number]  PCP:  Ann Held, DO   Chief Complaint  Patient presents with  . Cough    With chest "tightness" x 1week      History of Present Illness:  [name] is a 60 y.o. female patient who presents to [facility] for cc of chest tightness for one week.  Cough mildly productive.  Cough is throughout the day.  No fevers.  Fatigue, nasal congestion, and mild sore throat.     Patient Active Problem List   Diagnosis Date Noted  . Osteopenia 08/23/2014  . MOLE 05/07/2010  . MAMMOGRAM, ABNORMAL 04/18/2008  . HYPERTENSION 03/24/2007  . SPONTANEOUS TENSION PNEUMOTHORAX 03/24/2007  . FIBROIDS, UTERUS 01/18/2007  . MENORRHAGIA, PERIMENOPAUSAL 01/13/2007    Past Medical History:  Diagnosis Date  . Allergy   . H/O Bell's palsy 05/2010  . Hypertension   . Spontaneous pneumothorax 2005   R, s/p tube, then VATS    Past Surgical History:  Procedure Laterality Date  . CESAREAN SECTION    . CHEST TUBE INSERTION  2005   spontaneus Pneumothorax  . SPIROMETRY  01/20/2015   Dr. Donneta Romberg  . VIDEO ASSISTED THORACOSCOPY  2005    Social History  Substance Use Topics  . Smoking status: Former Smoker    Types: Cigarettes    Quit date: 07/11/1982  . Smokeless tobacco: Never Used  . Alcohol use 0.0 oz/week     Comment: rare    Family History  Problem Relation Age of Onset  . Hypertension Mother   . Stroke Mother   . Heart disease Father   . Hypertension Father   . Cancer Maternal Grandmother 72    breast  . Diabetes Maternal Grandmother     No Known Allergies  Medication list has been reviewed and updated.  Current Outpatient Prescriptions on File Prior to Visit  Medication Sig Dispense Refill  . albuterol (PROVENTIL) (2.5 MG/3ML) 0.083% nebulizer solution Take 2.5 mg by nebulization every 6 (six) hours as needed  for wheezing or shortness of breath.    . chlorpheniramine (CHLOR-TRIMETON) 4 MG tablet Take 4 mg by mouth 2 (two) times daily as needed for allergies.    . fluticasone (FLONASE) 50 MCG/ACT nasal spray Place 1 spray into both nostrils daily.    Marland Kitchen levocetirizine (XYZAL) 5 MG tablet Take 5 mg by mouth every evening.    . Olopatadine HCl (PATANASE) 0.6 % SOLN Place into the nose.    . sodium chloride (OCEAN) 0.65 % SOLN nasal spray Place 1 spray into both nostrils as needed for congestion.    Marland Kitchen spironolactone (ALDACTONE) 25 MG tablet take 1 tablet by mouth once daily 90 tablet 3  . valsartan (DIOVAN) 160 MG tablet take 1 tablet by mouth once daily 90 tablet 3   No current facility-administered medications on file prior to visit.     ROS ROS otherwise unremarkable unless listed above.   Physical Examination: BP 128/84 (BP Location: Right Arm, Patient Position: Sitting, Cuff Size: Normal)   Pulse 79   Temp 98.7 F (37.1 C) (Oral)   Resp 17   Ht 5\' 3"  (1.6 m)   Wt 120 lb (54.4 kg)   SpO2 100%   BMI 21.26 kg/m  Ideal Body Weight: Weight in (lb) to have BMI =  25: 140.8  Physical Exam  Constitutional: She is oriented to person, place, and time. She appears well-developed and well-nourished. No distress.  HENT:  Head: Normocephalic and atraumatic.  Right Ear: Tympanic membrane, external ear and ear canal normal.  Left Ear: Tympanic membrane, external ear and ear canal normal.  Nose: Mucosal edema and rhinorrhea present. Right sinus exhibits no maxillary sinus tenderness and no frontal sinus tenderness. Left sinus exhibits no maxillary sinus tenderness and no frontal sinus tenderness.  Mouth/Throat: No uvula swelling. No oropharyngeal exudate, posterior oropharyngeal edema or posterior oropharyngeal erythema.  Eyes: Conjunctivae and EOM are normal. Pupils are equal, round, and reactive to light.  Cardiovascular: Normal rate and regular rhythm.  Exam reveals no gallop, no distant heart  sounds and no friction rub.   No murmur heard. Pulmonary/Chest: Effort normal. No respiratory distress. She has decreased breath sounds. She has no rhonchi.  Lymphadenopathy:       Head (right side): No submandibular, no tonsillar, no preauricular and no posterior auricular adenopathy present.       Head (left side): No submandibular, no tonsillar, no preauricular and no posterior auricular adenopathy present.  Neurological: She is alert and oriented to person, place, and time.  Skin: She is not diaphoretic.  Psychiatric: She has a normal mood and affect. Her behavior is normal.     Assessment and Plan: [name] is a 60 y.o. female who is here today for chest tightness.  Supportive treatment for exacerbation of allergies and bronchospasms.  Advised to return if symptoms do not improve.  Chest tightness or pressure - Plan: montelukast (SINGULAIR) 10 MG tablet, beclomethasone (QVAR) 40 MCG/ACT inhaler, EKG 12-Lead, predniSONE (DELTASONE) 20 MG tablet, Guaifenesin (MUCINEX MAXIMUM STRENGTH) 1200 MG TB12, benzonatate (TESSALON) 100 MG capsule  Ivar Drape, PA-C [facility] 01/24/2016 9:49 AM

## 2016-05-31 ENCOUNTER — Encounter: Payer: Self-pay | Admitting: Family Medicine

## 2016-05-31 LAB — HM MAMMOGRAPHY

## 2016-05-31 LAB — HM DEXA SCAN

## 2016-06-11 ENCOUNTER — Encounter: Payer: Self-pay | Admitting: *Deleted

## 2016-07-21 ENCOUNTER — Encounter: Payer: Self-pay | Admitting: *Deleted

## 2016-07-29 ENCOUNTER — Ambulatory Visit (INDEPENDENT_AMBULATORY_CARE_PROVIDER_SITE_OTHER): Payer: BC Managed Care – PPO | Admitting: Family Medicine

## 2016-07-29 ENCOUNTER — Encounter: Payer: Self-pay | Admitting: Family Medicine

## 2016-07-29 ENCOUNTER — Ambulatory Visit (HOSPITAL_BASED_OUTPATIENT_CLINIC_OR_DEPARTMENT_OTHER)
Admission: RE | Admit: 2016-07-29 | Discharge: 2016-07-29 | Disposition: A | Discharge status: Home / Self Care | Payer: BC Managed Care – PPO | Source: Ambulatory Visit | Attending: Family Medicine | Admitting: Family Medicine

## 2016-07-29 VITALS — BP 154/84 | HR 66 | Temp 98.1°F | Resp 16 | Wt 121.2 lb

## 2016-07-29 DIAGNOSIS — R1011 Right upper quadrant pain: Secondary | ICD-10-CM | POA: Diagnosis not present

## 2016-07-29 DIAGNOSIS — R05 Cough: Secondary | ICD-10-CM | POA: Diagnosis not present

## 2016-07-29 DIAGNOSIS — R059 Cough, unspecified: Secondary | ICD-10-CM

## 2016-07-29 DIAGNOSIS — R1013 Epigastric pain: Secondary | ICD-10-CM

## 2016-07-29 HISTORY — DX: Right upper quadrant pain: R10.11

## 2016-07-29 HISTORY — DX: Epigastric pain: R10.13

## 2016-07-29 MED ORDER — GI COCKTAIL ~~LOC~~: 30.0000 mL | Freq: Once | ORAL | Status: DC

## 2016-07-29 MED ORDER — OMEPRAZOLE 20 MG PO CPDR: 20.0000 mg | DELAYED_RELEASE_CAPSULE | Freq: Every day | ORAL | 5 refills | Status: DC

## 2016-07-29 NOTE — Assessment & Plan Note (Signed)
gerd ho Check labs Omeprazole [place] abd --  ? gb due to RUQ tenderness as well

## 2016-07-29 NOTE — Progress Notes (Signed)
Patient ID: [name], female    DOB: Jun 01, 1955  Age: 61 y.o. MRN: [id number]    Subjective:  Subjective  HPI [name] presents for RUQ pain and heartburn x few weeks.  No otc tried. Pt also c/o cough, hoarse voice -- pt does see allergist.    Review of Systems  Constitutional: Negative for activity change, appetite change, fatigue and unexpected weight change.  HENT: Positive for voice change. Negative for congestion, postnasal drip, rhinorrhea and sinus pain.   Respiratory: Negative for cough and shortness of breath.   Cardiovascular: Negative for chest pain and palpitations.  Gastrointestinal: Positive for abdominal pain. Negative for abdominal distention, anal bleeding, blood in stool, constipation, diarrhea, nausea, rectal pain and vomiting.  Psychiatric/Behavioral: Negative for behavioral problems and dysphoric mood. The patient is not nervous/anxious.     History Past Medical History:  Diagnosis Date  . Allergy   . H/O Bell's palsy 05/2010  . Hypertension   . Spontaneous pneumothorax 2005   R, s/p tube, then VATS    She has a past surgical history that includes Chest tube insertion (2005); Video assisted thoracoscopy (2005); Cesarean section; and spirometry (01/20/2015).   Her family history includes Cancer (age of onset: 31) in her maternal grandmother; Diabetes in her maternal grandmother; Heart disease in her father; Hypertension in her father and mother; Stroke in her mother.She reports that she quit smoking about 34 years ago. Her smoking use included Cigarettes. She has never used smokeless tobacco. She reports that she drinks alcohol. She reports that she does not use drugs.  Current Outpatient Prescriptions on File Prior to Visit  Medication Sig Dispense Refill  . albuterol (PROVENTIL) (2.5 MG/3ML) 0.083% nebulizer solution Take 2.5 mg by nebulization every 6 (six) hours as needed for wheezing or shortness of breath.    . beclomethasone (QVAR) 40 MCG/ACT  inhaler Inhale into the lungs 2 (two) times daily.    . chlorpheniramine (CHLOR-TRIMETON) 4 MG tablet Take 4 mg by mouth 2 (two) times daily as needed for allergies.    . fluticasone (FLONASE) 50 MCG/ACT nasal spray Place 1 spray into both nostrils daily.    Marland Kitchen levocetirizine (XYZAL) 5 MG tablet Take 5 mg by mouth every evening.    . montelukast (SINGULAIR) 10 MG tablet Take 10 mg by mouth at bedtime.    . Olopatadine HCl (PATANASE) 0.6 % SOLN Place into the nose.    . sodium chloride (OCEAN) 0.65 % SOLN nasal spray Place 1 spray into both nostrils as needed for congestion.    Marland Kitchen spironolactone (ALDACTONE) 25 MG tablet take 1 tablet by mouth once daily 90 tablet 3  . valsartan (DIOVAN) 160 MG tablet take 1 tablet by mouth once daily 90 tablet 3   No current facility-administered medications on file prior to visit.      Objective:  Objective  Physical Exam  Constitutional: She is oriented to person, place, and time. She appears well-developed and well-nourished.  HENT:  Head: Normocephalic and atraumatic.  Eyes: Conjunctivae and EOM are normal.  Neck: Normal range of motion. Neck supple. No JVD present. Carotid bruit is not present. No thyromegaly present.  Cardiovascular: Normal rate, regular rhythm and normal heart sounds.   No murmur heard. Pulmonary/Chest: Effort normal and breath sounds normal. No respiratory distress. She has no wheezes. She has no rales. She exhibits no tenderness.  Abdominal: Soft. She exhibits no mass. There is tenderness in the epigastric area. There is no rebound and no guarding.  Musculoskeletal: She exhibits no edema.  Neurological: She is alert and oriented to person, place, and time.  Psychiatric: She has a normal mood and affect.  Nursing note and vitals reviewed.  BP (!) 154/84 (BP Location: Left Arm, Patient Position: Sitting, Cuff Size: Normal)   Pulse 66   Temp 98.1 F (36.7 C) (Oral)   Resp 16   Wt 121 lb 3.2 oz (55 kg)   SpO2 100%   BMI 21.47  kg/m  Wt Readings from Last 3 Encounters:  07/29/16 121 lb 3.2 oz (55 kg)  01/24/16 120 lb (54.4 kg)  09/23/15 121 lb (54.9 kg)     Lab Results  Component Value Date   WBC 7.1 09/23/2015   HGB 12.9 09/23/2015   HCT 37.9 09/23/2015   PLT 237.0 09/23/2015   GLUCOSE 91 09/23/2015   CHOL 176 09/23/2015   TRIG 58.0 09/23/2015   HDL 75.90 09/23/2015   LDLDIRECT 111.7 05/07/2010   LDLCALC 88 09/23/2015   ALT 15 09/23/2015   AST 14 09/23/2015   NA 137 09/23/2015   K 3.6 09/23/2015   CL 99 09/23/2015   CREATININE 0.62 09/23/2015   BUN 7 09/23/2015   CO2 27 09/23/2015   TSH 0.51 09/23/2015   MICROALBUR 0.2 02/21/2014    No results found.   Assessment & Plan:  Plan  I have discontinued Ms. Collyer's predniSONE, Guaifenesin, and benzonatate. I am also having her start on omeprazole. Additionally, I am having her maintain her sodium chloride, levocetirizine, albuterol, fluticasone, Olopatadine HCl, chlorpheniramine, spironolactone, valsartan, montelukast, and beclomethasone. We will continue to administer gi cocktail.  Meds ordered this encounter  Medications  . gi cocktail (Maalox,Lidocaine,Donnatal)  . omeprazole (PRILOSEC) 20 MG capsule    Sig: Take 1 capsule (20 mg total) by mouth daily.    Dispense:  30 capsule    Refill:  5    Problem List Items Addressed This Visit      Unprioritized   Epigastric pain    gerd ho Check labs Omeprazole [place] abd --  ? gb due to RUQ tenderness as well       Relevant Medications   gi cocktail (Maalox,Lidocaine,Donnatal)   omeprazole (PRILOSEC) 20 MG capsule   Other Relevant Orders   CBC with Differential/Platelet   Comprehensive metabolic panel   H. pylori antibody, IgG   RUQ pain   Relevant Medications   gi cocktail (Maalox,Lidocaine,Donnatal)   Other Relevant Orders   US Abdomen Complete    Other Visit Diagnoses    Cough    -  Primary   Relevant Orders   DG Chest 2 View      Follow-up: Return if symptoms worsen or fail  to improve.  Ann Held, DO

## 2016-07-29 NOTE — Patient Instructions (Signed)
Food Choices for Gastroesophageal Reflux Disease, Adult When you have gastroesophageal reflux disease (GERD), the foods you eat and your eating habits are very important. Choosing the right foods can help ease your discomfort. What guidelines do I need to follow?  Choose fruits, vegetables, whole grains, and low-fat dairy products.  Choose low-fat meat, fish, and poultry.  Limit fats such as oils, salad dressings, butter, nuts, and avocado.  Keep a food diary. This helps you identify foods that cause symptoms.  Avoid foods that cause symptoms. These may be different for everyone.  Eat small meals often instead of 3 large meals a day.  Eat your meals slowly, in a place where you are relaxed.  Limit fried foods.  Cook foods using methods other than frying.  Avoid drinking alcohol.  Avoid drinking large amounts of liquids with your meals.  Avoid bending over or lying down until 2-3 hours after eating. What foods are not recommended? These are some foods and drinks that may make your symptoms worse: Vegetables  Tomatoes. Tomato juice. Tomato and spaghetti sauce. Chili peppers. Onion and garlic. Horseradish. Fruits  Oranges, grapefruit, and lemon (fruit and juice). Meats  High-fat meats, fish, and poultry. This includes hot dogs, ribs, ham, sausage, salami, and bacon. Dairy  Whole milk and chocolate milk. Sour cream. Cream. Butter. Ice cream. Cream cheese. Drinks  Coffee and tea. Bubbly (carbonated) drinks or energy drinks. Condiments  Hot sauce. Barbecue sauce. Sweets/Desserts  Chocolate and cocoa. Donuts. Peppermint and spearmint. Fats and Oils  High-fat foods. This includes French fries and potato chips. Other  Vinegar. Strong spices. This includes black pepper, white pepper, red pepper, cayenne, curry powder, cloves, ginger, and chili powder. The items listed above may not be a complete list of foods and drinks to avoid. Contact your dietitian for more information.    This information is not intended to replace advice given to you by your health care provider. Make sure you discuss any questions you have with your health care provider. Document Released: 08/10/2011 Document Revised: 07/17/2015 Document Reviewed: 12/13/2012 Elsevier Interactive Patient Education  2017 Elsevier Inc[employer]  

## 2016-07-30 LAB — COMPREHENSIVE METABOLIC PANEL
ALT: 15 U/L (ref 0–35)
AST: 15 U/L (ref 0–37)
Albumin: 4.4 g/dL (ref 3.5–5.2)
Alkaline Phosphatase: 83 U/L (ref 39–117)
BILIRUBIN TOTAL: 0.5 mg/dL (ref 0.2–1.2)
BUN: 11 mg/dL (ref 6–23)
CALCIUM: 9.5 mg/dL (ref 8.4–10.5)
CHLORIDE: 97 meq/L (ref 96–112)
CO2: 29 meq/L (ref 19–32)
Creatinine, Ser: 0.62 mg/dL (ref 0.40–1.20)
GFR: 103.22 mL/min (ref >60.00)
Glucose, Bld: 97 mg/dL (ref 70–99)
POTASSIUM: 3.8 meq/L (ref 3.5–5.1)
Sodium: 131 mEq/L — ABNORMAL LOW (ref 135–145)
Total Protein: 7 g/dL (ref 6.0–8.3)

## 2016-07-30 LAB — CBC WITH DIFFERENTIAL/PLATELET
BASOS PCT: 1 % (ref 0.0–3.0)
Basophils Absolute: 0.1 10*3/uL (ref 0.0–0.1)
Eosinophils Absolute: 0.1 10*3/uL (ref 0.0–0.7)
Eosinophils Relative: 1.6 % (ref 0.0–5.0)
HEMATOCRIT: 36.9 % (ref 36.0–46.0)
Hemoglobin: 12.7 g/dL (ref 12.0–15.0)
LYMPHS PCT: 34 % (ref 12.0–46.0)
Lymphs Abs: 1.8 10*3/uL (ref 0.7–4.0)
MCHC: 34.6 g/dL (ref 30.0–36.0)
MCV: 86 fl (ref 78.0–100.0)
MONOS PCT: 5.1 % (ref 3.0–12.0)
Monocytes Absolute: 0.3 10*3/uL (ref 0.1–1.0)
NEUTROS ABS: 3.2 10*3/uL (ref 1.4–7.7)
Neutrophils Relative %: 58.3 % (ref 43.0–77.0)
Platelets: 230 10*3/uL (ref 150.0–400.0)
RBC: 4.29 Mil/uL (ref 3.87–5.11)
RDW: 12.6 % (ref 11.5–15.5)
WBC: 5.4 10*3/uL (ref 4.0–10.5)

## 2016-07-30 LAB — H. PYLORI ANTIBODY, IGG: H Pylori IgG: NEGATIVE

## 2016-08-04 ENCOUNTER — Ambulatory Visit (HOSPITAL_BASED_OUTPATIENT_CLINIC_OR_DEPARTMENT_OTHER)
Admission: RE | Admit: 2016-08-04 | Discharge: 2016-08-04 | Disposition: A | Discharge status: Home / Self Care | Payer: BC Managed Care – PPO | Source: Ambulatory Visit | Attending: Family Medicine | Admitting: Family Medicine

## 2016-08-04 DIAGNOSIS — R932 Abnormal findings on diagnostic imaging of liver and biliary tract: Secondary | ICD-10-CM | POA: Insufficient documentation

## 2016-08-04 DIAGNOSIS — R1011 Right upper quadrant pain: Secondary | ICD-10-CM

## 2016-09-24 ENCOUNTER — Encounter: Payer: BC Managed Care – PPO | Admitting: Family Medicine

## 2016-10-08 ENCOUNTER — Other Ambulatory Visit: Payer: Self-pay | Admitting: Family Medicine

## 2016-10-08 DIAGNOSIS — I10 Essential (primary) hypertension: Secondary | ICD-10-CM

## 2016-10-18 ENCOUNTER — Ambulatory Visit (INDEPENDENT_AMBULATORY_CARE_PROVIDER_SITE_OTHER): Payer: BC Managed Care – PPO | Admitting: Family Medicine

## 2016-10-18 ENCOUNTER — Encounter: Payer: Self-pay | Admitting: Family Medicine

## 2016-10-18 ENCOUNTER — Other Ambulatory Visit (HOSPITAL_COMMUNITY)
Admission: RE | Admit: 2016-10-18 | Discharge: 2016-10-18 | Disposition: A | Discharge status: Home / Self Care | Payer: BC Managed Care – PPO | Source: Ambulatory Visit | Attending: Family Medicine | Admitting: Family Medicine

## 2016-10-18 VITALS — BP 128/78 | HR 55 | Temp 98.2°F | Resp 17 | Ht 63.0 in | Wt 119.2 lb

## 2016-10-18 DIAGNOSIS — I10 Essential (primary) hypertension: Secondary | ICD-10-CM

## 2016-10-18 DIAGNOSIS — Z0000 Encounter for general adult medical examination without abnormal findings: Secondary | ICD-10-CM

## 2016-10-18 DIAGNOSIS — Z01419 Encounter for gynecological examination (general) (routine) without abnormal findings: Secondary | ICD-10-CM | POA: Diagnosis not present

## 2016-10-18 LAB — POC URINALSYSI DIPSTICK (AUTOMATED)
Bilirubin, UA: NEGATIVE
Blood, UA: NEGATIVE
GLUCOSE UA: NEGATIVE
Ketones, UA: NEGATIVE
LEUKOCYTES UA: NEGATIVE
NITRITE UA: NEGATIVE
Protein, UA: NEGATIVE
Spec Grav, UA: 1.015 (ref 1.010–1.025)
Urobilinogen, UA: 0.2 E.U./dL
pH, UA: 6 (ref 5.0–8.0)

## 2016-10-18 LAB — COMPREHENSIVE METABOLIC PANEL
ALT: 14 U/L (ref 0–35)
AST: 13 U/L (ref 0–37)
Albumin: 4.3 g/dL (ref 3.5–5.2)
Alkaline Phosphatase: 88 U/L (ref 39–117)
BILIRUBIN TOTAL: 0.5 mg/dL (ref 0.2–1.2)
BUN: 10 mg/dL (ref 6–23)
CO2: 31 meq/L (ref 19–32)
Calcium: 9.1 mg/dL (ref 8.4–10.5)
Chloride: 102 mEq/L (ref 96–112)
Creatinine, Ser: 0.64 mg/dL (ref 0.40–1.20)
GFR: 99.44 mL/min (ref >60.00)
GLUCOSE: 104 mg/dL — AB (ref 70–99)
Potassium: 3.5 mEq/L (ref 3.5–5.1)
SODIUM: 137 meq/L (ref 135–145)
Total Protein: 7 g/dL (ref 6.0–8.3)

## 2016-10-18 LAB — CBC WITH DIFFERENTIAL/PLATELET
BASOS PCT: 0.8 % (ref 0.0–3.0)
Basophils Absolute: 0.1 10*3/uL (ref 0.0–0.1)
EOS PCT: 1.6 % (ref 0.0–5.0)
Eosinophils Absolute: 0.1 10*3/uL (ref 0.0–0.7)
HCT: 38.4 % (ref 36.0–46.0)
Hemoglobin: 13.2 g/dL (ref 12.0–15.0)
LYMPHS ABS: 1.6 10*3/uL (ref 0.7–4.0)
Lymphocytes Relative: 24.9 % (ref 12.0–46.0)
MCHC: 34.4 g/dL (ref 30.0–36.0)
MCV: 86.5 fl (ref 78.0–100.0)
MONOS PCT: 6 % (ref 3.0–12.0)
Monocytes Absolute: 0.4 10*3/uL (ref 0.1–1.0)
NEUTROS ABS: 4.2 10*3/uL (ref 1.4–7.7)
NEUTROS PCT: 66.7 % (ref 43.0–77.0)
PLATELETS: 216 10*3/uL (ref 150.0–400.0)
RBC: 4.43 Mil/uL (ref 3.87–5.11)
RDW: 12.4 % (ref 11.5–15.5)
WBC: 6.3 10*3/uL (ref 4.0–10.5)

## 2016-10-18 LAB — LIPID PANEL
CHOLESTEROL: 186 mg/dL (ref 0–200)
HDL: 62.4 mg/dL (ref >39.00)
LDL Cholesterol: 107 mg/dL — ABNORMAL HIGH (ref 0–99)
NONHDL: 123.25
Total CHOL/HDL Ratio: 3
Triglycerides: 79 mg/dL (ref 0.0–149.0)
VLDL: 15.8 mg/dL (ref 0.0–40.0)

## 2016-10-18 LAB — TSH: TSH: 0.8 u[IU]/mL (ref 0.35–4.50)

## 2016-10-18 NOTE — Progress Notes (Signed)
Subjective:     [name] is a 61 y.o. female and is here for a comprehensive physical exam. The patient reports no problems.  Social History   Social History  . Marital status: Married    Spouse name: N/A  . Number of children: 3  . Years of education: N/A   Occupational History  . [employer]   Social History Main Topics  . Smoking status: Former Smoker    Types: Cigarettes    Quit date: 07/11/1982  . Smokeless tobacco: Never Used  . Alcohol use 0.0 oz/week     Comment: rare  . Drug use: No  . Sexual activity: Yes    Partners: Male    Birth control/ protection: Post-menopausal     Comment: 1st intercourse 61 yo-Fewer than 5 partners   Other Topics Concern  . Not on file   Social History Narrative   Exercise-- jazzersize  3x a week    Health Maintenance  Topic Date Due  . HIV Screening  03/31/1968  . INFLUENZA VACCINE  09/22/2016  . MAMMOGRAM  05/31/2017  . COLONOSCOPY  06/06/2017  . TETANUS/TDAP  04/18/2018  . DEXA SCAN  06/01/2018  . PAP SMEAR  09/23/2018  . Hepatitis C Screening  Completed    The following portions of the patient's history were reviewed and updated as appropriate:  She  has a past medical history of Allergy; H/O Bell's palsy (05/2010); Hypertension; and Spontaneous pneumothorax (2005). She  does not have any pertinent problems on file. She  has a past surgical history that includes Chest tube insertion (2005); Video assisted thoracoscopy (2005); Cesarean section; and spirometry (01/20/2015). Her family history includes Cancer (age of onset: 73) in her maternal grandmother; Diabetes in her maternal grandmother; Heart disease in her father; Hypertension in her father and mother; Stroke in her mother. She  reports that she quit smoking about 34 years ago. Her smoking use included Cigarettes. She has never used smokeless tobacco. She reports that she drinks alcohol. She reports that she does not use  drugs. She has a current medication list which includes the following prescription(s): albuterol, chlorpheniramine, fluticasone, levocetirizine, montelukast, olopatadine hcl, sodium chloride, spironolactone, valsartan, beclomethasone, and omeprazole, and the following Facility-Administered Medications: gi cocktail. Current Outpatient Prescriptions on File Prior to Visit  Medication Sig Dispense Refill  . albuterol (PROVENTIL) (2.5 MG/3ML) 0.083% nebulizer solution Take 2.5 mg by nebulization every 6 (six) hours as needed for wheezing or shortness of breath.    . chlorpheniramine (CHLOR-TRIMETON) 4 MG tablet Take 4 mg by mouth 2 (two) times daily as needed for allergies.    . fluticasone (FLONASE) 50 MCG/ACT nasal spray Place 1 spray into both nostrils daily.    Marland Kitchen levocetirizine (XYZAL) 5 MG tablet Take 5 mg by mouth every evening.    . montelukast (SINGULAIR) 10 MG tablet Take 10 mg by mouth at bedtime.    . Olopatadine HCl (PATANASE) 0.6 % SOLN Place into the nose.    . sodium chloride (OCEAN) 0.65 % SOLN nasal spray Place 1 spray into both nostrils as needed for congestion.    Marland Kitchen spironolactone (ALDACTONE) 25 MG tablet take 1 tablet by mouth once daily 90 tablet 3  . valsartan (DIOVAN) 160 MG tablet take 1 tablet by mouth once daily 90 tablet 3  . beclomethasone (QVAR) 40 MCG/ACT inhaler Inhale into the lungs 2 (two) times daily.    Marland Kitchen omeprazole (PRILOSEC) 20 MG capsule Take 1 capsule (20  mg total) by mouth daily. (Patient not taking: Reported on 10/18/2016) 30 capsule 5   Current Facility-Administered Medications on File Prior to Visit  Medication Dose Route Frequency Provider Last Rate Last Dose  . gi cocktail (Maalox,Lidocaine,Donnatal)  30 mL Oral Once Roma Schanz R, DO       She has No Known Allergies..  Review of Systems Review of Systems  Constitutional: Negative for activity change, appetite change and fatigue.  HENT: Negative for hearing loss, congestion, tinnitus and ear  discharge.  dentist q59m Eyes: Negative for visual disturbance (see optho q1y -- vision corrected to 20/20 with glasses).  Respiratory: Negative for cough, chest tightness and shortness of breath.   Cardiovascular: Negative for chest pain, palpitations and leg swelling.  Gastrointestinal: Negative for abdominal pain, diarrhea, constipation and abdominal distention.  Genitourinary: Negative for urgency, frequency, decreased urine volume and difficulty urinating.  Musculoskeletal: Negative for back pain, arthralgias and gait problem.  Skin: Negative for color change, pallor and rash.  Neurological: Negative for dizziness, light-headedness, numbness and headaches.  Hematological: Negative for adenopathy. Does not bruise/bleed easily.  Psychiatric/Behavioral: Negative for suicidal ideas, confusion, sleep disturbance, self-injury, dysphoric mood, decreased concentration and agitation.       Objective:    BP 128/78 (BP Location: Left Arm, Patient Position: Sitting, Cuff Size: Normal)   Pulse (!) 55   Temp 98.2 F (36.8 C) (Oral)   Resp 17   Ht 5\' 3"  (1.6 m)   Wt 119 lb 3.2 oz (54.1 kg)   SpO2 99%   BMI 21.12 kg/m  General appearance: alert, cooperative, appears stated age and no distress Head: Normocephalic, without obvious abnormality, atraumatic Eyes: conjunctivae/corneas clear. PERRL, EOM's intact. Fundi benign. Ears: normal TM's and external ear canals both ears Nose: Nares normal. Septum midline. Mucosa normal. No drainage or sinus tenderness. Throat: lips, mucosa, and tongue normal; teeth and gums normal Neck: no adenopathy, no carotid bruit, no JVD, supple, symmetrical, trachea midline and thyroid not enlarged, symmetric, no tenderness/mass/nodules Back: symmetric, no curvature. ROM normal. No CVA tenderness. Lungs: clear to auscultation bilaterally Breasts: normal appearance, no masses or tenderness Heart: regular rate and rhythm, S1, S2 normal, no murmur, click, rub or  gallop Abdomen: soft, non-tender; bowel sounds normal; no masses,  no organomegaly Pelvic: cervix normal in appearance, external genitalia normal, no adnexal masses or tenderness, no cervical motion tenderness, rectovaginal septum normal, uterus normal size, shape, and consistency, vagina normal without discharge and pap done , rectum -- heme neg brown stool Extremities: extremities normal, atraumatic, no cyanosis or edema Pulses: 2+ and symmetric Skin: Skin color, texture, turgor normal. No rashes or lesions Lymph nodes: Cervical, supraclavicular, and axillary nodes normal. Neurologic: Alert and oriented X 3, normal strength and tone. Normal symmetric reflexes. Normal coordination and gait    Assessment:    Healthy female exam.      Plan:    ghm utd Check labs See After Visit Summary for Counseling Recommendations    1. Essential hypertension Stable con't mecs  - CBC with Differential/Platelet - Lipid panel - TSH - Comprehensive metabolic panel - POCT Urinalysis Dipstick (Automated)  2. Preventative health care See above - CBC with Differential/Platelet - Lipid panel - TSH - Comprehensive metabolic panel - POCT Urinalysis Dipstick (Automated) - Cytology - PAP

## 2016-10-18 NOTE — Patient Instructions (Signed)
Preventive Care 40-64 Years, Female Preventive care refers to lifestyle choices and visits with your health care provider that can promote health and wellness. What does preventive care include?  A yearly physical exam. This is also called an annual well check.  Dental exams once or twice a year.  Routine eye exams. Ask your health care provider how often you should have your eyes checked.  Personal lifestyle choices, including: ? Daily care of your teeth and gums. ? Regular physical activity. ? Eating a healthy diet. ? Avoiding tobacco and drug use. ? Limiting alcohol use. ? Practicing safe sex. ? Taking low-dose aspirin daily starting at age 63. ? Taking vitamin and mineral supplements as recommended by your health care provider. What happens during an annual well check? The services and screenings done by your health care provider during your annual well check will depend on your age, overall health, lifestyle risk factors, and family history of disease. Counseling Your health care provider may ask you questions about your:  Alcohol use.  Tobacco use.  Drug use.  Emotional well-being.  Home and relationship well-being.  Sexual activity.  Eating habits.  Work and work Statistician.  Method of birth control.  Menstrual cycle.  Pregnancy history.  Screening You may have the following tests or measurements:  Height, weight, and BMI.  Blood pressure.  Lipid and cholesterol levels. These may be checked every 5 years, or more frequently if you are over 63 years old.  Skin check.  Lung cancer screening. You may have this screening every year starting at age 63 if you have a 30-pack-year history of smoking and currently smoke or have quit within the past 15 years.  Fecal occult blood test (FOBT) of the stool. You may have this test every year starting at age 63.  Flexible sigmoidoscopy or colonoscopy. You may have a sigmoidoscopy every 5 years or a colonoscopy  every 10 years starting at age 63.  Hepatitis C blood test.  Hepatitis B blood test.  Sexually transmitted disease (STD) testing.  Diabetes screening. This is done by checking your blood sugar (glucose) after you have not eaten for a while (fasting). You may have this done every 1-3 years.  Mammogram. This may be done every 1-2 years. Talk to your health care provider about when you should start having regular mammograms. This may depend on whether you have a family history of breast cancer.  BRCA-related cancer screening. This may be done if you have a family history of breast, ovarian, tubal, or peritoneal cancers.  Pelvic exam and Pap test. This may be done every 3 years starting at age 63. Starting at age 36, this may be done every 5 years if you have a Pap test in combination with an HPV test.  Bone density scan. This is done to screen for osteoporosis. You may have this scan if you are at high risk for osteoporosis.  Discuss your test results, treatment options, and if necessary, the need for more tests with your health care provider. Vaccines Your health care provider may recommend certain vaccines, such as:  Influenza vaccine. This is recommended every year.  Tetanus, diphtheria, and acellular pertussis (Tdap, Td) vaccine. You may need a Td booster every 10 years.  Varicella vaccine. You may need this if you have not been vaccinated.  Zoster vaccine. You may need this after age 5.  Measles, mumps, and rubella (MMR) vaccine. You may need at least one dose of MMR if you were born in  1957 or later. You may also need a second dose.  Pneumococcal 13-valent conjugate (PCV13) vaccine. You may need this if you have certain conditions and were not previously vaccinated.  Pneumococcal polysaccharide (PPSV23) vaccine. You may need one or two doses if you smoke cigarettes or if you have certain conditions.  Meningococcal vaccine. You may need this if you have certain  conditions.  Hepatitis A vaccine. You may need this if you have certain conditions or if you travel or work in places where you may be exposed to hepatitis A.  Hepatitis B vaccine. You may need this if you have certain conditions or if you travel or work in places where you may be exposed to hepatitis B.  Haemophilus influenzae type b (Hib) vaccine. You may need this if you have certain conditions.  Talk to your health care provider about which screenings and vaccines you need and how often you need them. This information is not intended to replace advice given to you by your health care provider. Make sure you discuss any questions you have with your health care provider. Document Released: 03/07/2015 Document Revised: 10/29/2015 Document Reviewed: 12/10/2014 Elsevier Interactive Patient Education  2017 [employer].

## 2016-10-19 LAB — CYTOLOGY - PAP: Diagnosis: NEGATIVE

## 2016-12-28 ENCOUNTER — Encounter: Payer: Self-pay | Admitting: Gynecology

## 2016-12-28 ENCOUNTER — Ambulatory Visit: Payer: BC Managed Care – PPO | Admitting: Gynecology

## 2016-12-28 VITALS — BP 118/76

## 2016-12-28 DIAGNOSIS — R1031 Right lower quadrant pain: Secondary | ICD-10-CM | POA: Diagnosis not present

## 2016-12-28 NOTE — Patient Instructions (Signed)
Follow-up for the ultrasound as scheduled. 

## 2016-12-28 NOTE — Progress Notes (Signed)
    AKILI CUDA Aug 21, 1955 [id number]        61 y.o.  G4P0013 presents with a 75-month history of right lower quadrant discomfort that comes and goes.  More of a discomfort than sharp pain.  Does have some intermittent bloating with some diarrhea and constipation intermittently.  No nausea vomiting.  No urinary symptoms such as frequency dysuria urgency low back pain fever or chills.  Had normal pelvic exam this year through her primary physician's office with a normal Pap smear.  Had some postmenopausal spotting 2 years ago with a negative sonohysterogram and negative endometrial biopsy.  Past medical history,surgical history, problem list, medications, allergies, family history and social history were all reviewed and documented in the EPIC chart.  Directed ROS with pertinent positives and negatives documented in the history of present illness/assessment and plan.  Exam: Caryn Bee assistant Vitals:   12/28/16 1058  BP: 118/76   General appearance:  Normal Abdomen soft nontender without masses guarding rebound Pelvic external BUS vagina with atrophic changes.  Cervix with atrophic changes.  Uterus normal size midline mobile nontender.  Adnexa without masses or tenderness.  Rectal exam is normal.  Assessment/Plan:  61 y.o. G4P0013 low-level right lower quadrant intermittent discomfort.  Does have some bloating with diarrhea/constipation.  Suspect discomfort is GI related.  Is coming due for colonoscopy and I recommended that she follow-up with her gastroenterologist for evaluation of this discomfort as well as arranging for her colonoscopy.  I also suggested a trial of probiotics.  Will check UA to rule out urinary tract involvement.  Recommended baseline ultrasound now to rule out GYN etiology particularly ovarian given the ill-defined symptoms and her concern about ovarian having read a lot on the Internet.  She will follow-up with me after the ultrasound but otherwise will be making  arrangements to see her gastroenterologist.    Anastasio Auerbach MD, 11:15 AM 12/28/2016

## 2016-12-29 LAB — URINALYSIS W MICROSCOPIC + REFLEX CULTURE
BILIRUBIN URINE: NEGATIVE
Bacteria, UA: NONE SEEN /HPF
GLUCOSE, UA: NEGATIVE
Hgb urine dipstick: NEGATIVE
Hyaline Cast: NONE SEEN /LPF
KETONES UR: NEGATIVE
LEUKOCYTE ESTERASE: NEGATIVE
NITRITES URINE, INITIAL: NEGATIVE
Protein, ur: NEGATIVE
RBC / HPF: NONE SEEN /HPF (ref 0–2)
SPECIFIC GRAVITY, URINE: 1.006 (ref 1.001–1.03)
SQUAMOUS EPITHELIAL / LPF: NONE SEEN /HPF (ref <=5)
WBC, UA: NONE SEEN /HPF (ref 0–5)
pH: 7.5 (ref 5.0–8.0)

## 2016-12-29 LAB — NO CULTURE INDICATED

## 2017-01-19 ENCOUNTER — Encounter: Payer: Self-pay | Admitting: Gynecology

## 2017-01-19 ENCOUNTER — Ambulatory Visit: Payer: BC Managed Care – PPO | Admitting: Gynecology

## 2017-01-19 ENCOUNTER — Ambulatory Visit (INDEPENDENT_AMBULATORY_CARE_PROVIDER_SITE_OTHER): Payer: BC Managed Care – PPO

## 2017-01-19 VITALS — BP 118/78

## 2017-01-19 DIAGNOSIS — R1031 Right lower quadrant pain: Secondary | ICD-10-CM

## 2017-01-19 NOTE — Progress Notes (Signed)
    DIMOND CROTTY 04-25-1955 [id number]        61 y.o.  G4P0013 presents for ultrasound. History of right lower quadrant discomfort that comes and goes.  Past medical history,surgical history, problem list, medications, allergies, family history and social history were all reviewed and documented in the EPIC chart.  Directed ROS with pertinent positives and negatives documented in the history of present illness/assessment and plan.  Exam: Vitals:   01/19/17 1143  BP: 118/78   General appearance:  Normal  Ultrasound transvaginal shows uterus normal size anteverted with heterogeneous echotexture. Small myomas noted 10 x 7 mm and 9 x 7 mm. Endometrial echo 5.1 mm. Right ovary was small calcification 10 x 6 x 11 mm. Negative flow. Left ovary normal. Cul-de-sac negative.  Assessment/Plan:  61 y.o. G4P0013 with right lower quadrant pain that I suspect is secondary to GI. She actually is planning on colonoscopy beginning of next year and is going to follow up with her gastroenterologist in reference to this. Ultrasound is normal overall. Small echogenic calcification right ovary I think is physiologic. No blood flow to the area or cystic changes. Reviewed with patient and we both feel comfortable with no special follow up. Endometrial echo is 5.1 mm. Patient has no bleeding or other symptoms. Issues of whether to evaluate as it is greater than 4 mm discussed. We both feel comfortable at this point with no further evaluation. Patient knows to call if any bleeding.     Anastasio Auerbach MD, 3:20 PM 01/19/2017

## 2017-01-19 NOTE — Patient Instructions (Addendum)
Follow up with your gastroenterologist as we discussed.

## 2017-02-07 ENCOUNTER — Encounter: Payer: Self-pay | Admitting: *Deleted

## 2017-03-22 ENCOUNTER — Encounter: Payer: Self-pay | Admitting: Nurse Practitioner

## 2017-03-22 ENCOUNTER — Ambulatory Visit: Payer: BC Managed Care – PPO | Admitting: Nurse Practitioner

## 2017-03-22 VITALS — BP 118/76 | HR 76 | Temp 98.3°F | Ht 63.0 in | Wt 119.0 lb

## 2017-03-22 DIAGNOSIS — J101 Influenza due to other identified influenza virus with other respiratory manifestations: Secondary | ICD-10-CM

## 2017-03-22 LAB — POC INFLUENZA A&B (BINAX/QUICKVUE)
Influenza A, POC: POSITIVE — AB
Influenza B, POC: NEGATIVE

## 2017-03-22 MED ORDER — DM-GUAIFENESIN ER 30-600 MG PO TB12: 1.0000 | ORAL_TABLET | Freq: Two times a day (BID) | ORAL | 0 refills | Status: DC | PRN

## 2017-03-22 MED ORDER — METHYLPREDNISOLONE ACETATE 40 MG/ML IJ SUSP
40.0000 mg | Freq: Once | INTRAMUSCULAR | Status: AC
  Administered 2017-03-22: 40 mg via INTRAMUSCULAR

## 2017-03-22 MED ORDER — OSELTAMIVIR PHOSPHATE 75 MG PO CAPS: 75.0000 mg | ORAL_CAPSULE | Freq: Two times a day (BID) | ORAL | 0 refills | Status: DC

## 2017-03-22 MED ORDER — OXYMETAZOLINE HCL 0.05 % NA SOLN: 1.0000 | Freq: Two times a day (BID) | NASAL | 0 refills | Status: DC

## 2017-03-22 NOTE — Progress Notes (Signed)
Subjective:  Patient ID: [name], female    DOB: 11/06/1955  Age: 62 y.o. MRN: [id number]  CC: Sore Throat (sore throat,coughing,fever,facial pain headache,sneezing/ 3 days/ took tylenol/ hx allergy issue)   URI   This is a new problem. The current episode started yesterday. The problem has been gradually worsening. The maximum temperature recorded prior to her arrival was 101 - 101.9 F. Associated symptoms include congestion, coughing, ear pain, a plugged ear sensation, rhinorrhea, sinus pain and sneezing. Pertinent negatives include no wheezing.    Outpatient Medications Prior to Visit  Medication Sig Dispense Refill  . albuterol (PROVENTIL) (2.5 MG/3ML) 0.083% nebulizer solution Take 2.5 mg by nebulization every 6 (six) hours as needed for wheezing or shortness of breath.    . chlorpheniramine (CHLOR-TRIMETON) 4 MG tablet Take 4 mg by mouth 2 (two) times daily as needed for allergies.    . fluticasone (FLONASE) 50 MCG/ACT nasal spray Place 1 spray into both nostrils daily.    Marland Kitchen levocetirizine (XYZAL) 5 MG tablet Take 5 mg by mouth every evening.    . montelukast (SINGULAIR) 10 MG tablet Take 10 mg by mouth at bedtime.    . sodium chloride (OCEAN) 0.65 % SOLN nasal spray Place 1 spray into both nostrils as needed for congestion.    Marland Kitchen spironolactone (ALDACTONE) 25 MG tablet take 1 tablet by mouth once daily 90 tablet 3  . valsartan (DIOVAN) 160 MG tablet take 1 tablet by mouth once daily 90 tablet 3  . beclomethasone (QVAR) 40 MCG/ACT inhaler Inhale into the lungs 2 (two) times daily.    . Olopatadine HCl (PATANASE) 0.6 % SOLN Place into the nose.    Marland Kitchen omeprazole (PRILOSEC) 20 MG capsule Take 1 capsule (20 mg total) by mouth daily. (Patient not taking: Reported on 01/19/2017) 30 capsule 5   Facility-Administered Medications Prior to Visit  Medication Dose Route Frequency Provider Last Rate Last Dose  . gi cocktail (Maalox,Lidocaine,Donnatal)  30 mL Oral Once [employer], Yvonne R,  DO        ROS See HPI  Objective:  BP 118/76   Pulse 76   Temp 98.3 F (36.8 C)   Ht 5\' 3"  (1.6 m)   Wt 119 lb (54 kg)   SpO2 98%   BMI 21.08 kg/m   BP Readings from Last 3 Encounters:  03/22/17 118/76  01/19/17 118/78  12/28/16 118/76    Wt Readings from Last 3 Encounters:  03/22/17 119 lb (54 kg)  10/18/16 119 lb 3.2 oz (54.1 kg)  07/29/16 121 lb 3.2 oz (55 kg)    Physical Exam  Constitutional: She is oriented to person, place, and time.  HENT:  Right Ear: Tympanic membrane, external ear and ear canal normal.  Left Ear: Tympanic membrane, external ear and ear canal normal.  Nose: Mucosal edema and rhinorrhea present. Right sinus exhibits maxillary sinus tenderness. Right sinus exhibits no frontal sinus tenderness. Left sinus exhibits maxillary sinus tenderness. Left sinus exhibits no frontal sinus tenderness.  Mouth/Throat: Uvula is midline. No trismus in the jaw. Posterior oropharyngeal erythema present. No oropharyngeal exudate.  Eyes: No scleral icterus.  Neck: Normal range of motion. Neck supple.  Cardiovascular: Normal rate and normal heart sounds.  Pulmonary/Chest: Effort normal and breath sounds normal.  Musculoskeletal: She exhibits no edema.  Lymphadenopathy:    She has no cervical adenopathy.  Neurological: She is alert and oriented to person, place, and time.  Vitals reviewed.   Lab Results  Component Value  Date   WBC 6.3 10/18/2016   HGB 13.2 10/18/2016   HCT 38.4 10/18/2016   PLT 216.0 10/18/2016   GLUCOSE 104 (H) 10/18/2016   CHOL 186 10/18/2016   TRIG 79.0 10/18/2016   HDL 62.40 10/18/2016   LDLDIRECT 111.7 05/07/2010   LDLCALC 107 (H) 10/18/2016   ALT 14 10/18/2016   AST 13 10/18/2016   NA 137 10/18/2016   K 3.5 10/18/2016   CL 102 10/18/2016   CREATININE 0.64 10/18/2016   BUN 10 10/18/2016   CO2 31 10/18/2016   TSH 0.80 10/18/2016   MICROALBUR 0.2 02/21/2014    Assessment & Plan:   [name] was seen today for sore  throat.  Diagnoses and all orders for this visit:  Influenza A -     POC Influenza A&B(BINAX/QUICKVUE) -     oseltamivir (TAMIFLU) 75 MG capsule; Take 1 capsule (75 mg total) by mouth 2 (two) times daily. -     dextromethorphan-guaiFENesin (MUCINEX DM) 30-600 MG 12hr tablet; Take 1 tablet by mouth 2 (two) times daily as needed for cough. -     methylPREDNISolone acetate (DEPO-MEDROL) injection 40 mg -     oxymetazoline (AFRIN NASAL SPRAY) 0.05 % nasal spray; Place 1 spray into both nostrils 2 (two) times daily. Use only for 3days, then stop   I am having [name] start on oseltamivir, dextromethorphan-guaiFENesin, and oxymetazoline. I am also having her maintain her sodium chloride, levocetirizine, albuterol, fluticasone, Olopatadine HCl, chlorpheniramine, montelukast, beclomethasone, omeprazole, spironolactone, and valsartan. We administered methylPREDNISolone acetate. We will continue to administer gi cocktail.  Meds ordered this encounter  Medications  . oseltamivir (TAMIFLU) 75 MG capsule    Sig: Take 1 capsule (75 mg total) by mouth 2 (two) times daily.    Dispense:  10 capsule    Refill:  0    Order Specific Question:   Supervising Provider    Answer:   Lucille Passy [[id number]]  . dextromethorphan-guaiFENesin (MUCINEX DM) 30-600 MG 12hr tablet    Sig: Take 1 tablet by mouth 2 (two) times daily as needed for cough.    Dispense:  14 tablet    Refill:  0    Order Specific Question:   Supervising Provider    Answer:   Lucille Passy [[id number]]  . methylPREDNISolone acetate (DEPO-MEDROL) injection 40 mg  . oxymetazoline (AFRIN NASAL SPRAY) 0.05 % nasal spray    Sig: Place 1 spray into both nostrils 2 (two) times daily. Use only for 3days, then stop    Dispense:  30 mL    Refill:  0    Order Specific Question:   Supervising Provider    Answer:   Lucille Passy [[id number]]    Follow-up: Return if symptoms worsen or fail to improve.  Wilfred Lacy, NP

## 2017-03-22 NOTE — Patient Instructions (Signed)
URI Instructions: Flonase and Afrin use: apply 1spray of afrin in each nare, wait 77mins, then apply 2sprays of flonase in each nare. Use both nasal spray consecutively x 3days, then flonase only.  Hold azestaline while using afrin nasal spray.  Encourage adequate oral hydration.  Use over-the-counter  "cold" medicines  such as "Tylenol cold" , "Advil cold",  "Mucinex" or" Mucinex D"  for cough and congestion.  Avoid decongestants if you have high blood pressure. Use" Delsym" or" Robitussin" cough syrup varietis for cough.  You can use plain "Tylenol" or "Advi"l for fever, chills and achyness.   "Common cold" symptoms are usually triggered by a virus.  The antibiotics are usually not necessary. On average, a" viral cold" illness would take 4-7 days to resolve. Please, make an appointment if you are not better or if you're worse.

## 2017-05-23 ENCOUNTER — Encounter: Payer: Self-pay | Admitting: Gastroenterology

## 2017-06-16 LAB — HM MAMMOGRAPHY

## 2017-06-21 ENCOUNTER — Encounter: Payer: Self-pay | Admitting: Family Medicine

## 2017-06-21 LAB — HM MAMMOGRAPHY

## 2017-06-30 ENCOUNTER — Telehealth: Payer: Self-pay | Admitting: *Deleted

## 2017-06-30 NOTE — Telephone Encounter (Signed)
Received Physician Orders from [facility]; forwarded to provider/SLS 05/09

## 2017-07-08 ENCOUNTER — Encounter: Payer: Self-pay | Admitting: Family Medicine

## 2017-08-11 ENCOUNTER — Encounter: Payer: Self-pay | Admitting: Gastroenterology

## 2017-10-07 ENCOUNTER — Other Ambulatory Visit: Payer: Self-pay

## 2017-10-07 ENCOUNTER — Ambulatory Visit (AMBULATORY_SURGERY_CENTER): Payer: Self-pay

## 2017-10-07 VITALS — Ht 63.0 in | Wt 113.8 lb

## 2017-10-07 DIAGNOSIS — Z1211 Encounter for screening for malignant neoplasm of colon: Secondary | ICD-10-CM

## 2017-10-07 MED ORDER — NA SULFATE-K SULFATE-MG SULF 17.5-3.13-1.6 GM/177ML PO SOLN: 1.0000 | Freq: Once | ORAL | 0 refills | Status: AC

## 2017-10-07 NOTE — Progress Notes (Signed)
No egg or soy allergy known to patient  No issues with past sedation with any surgeries  or procedures, no intubation problems  No diet pills per patient No home 02 use per patient  No blood thinners per patient  Pt denies issues with constipation  No A fib or A flutter  EMMI video sent to pt's e mail , pt declined    

## 2017-10-12 ENCOUNTER — Encounter: Payer: Self-pay | Admitting: Family Medicine

## 2017-10-12 ENCOUNTER — Encounter: Payer: Self-pay | Admitting: Gastroenterology

## 2017-10-12 DIAGNOSIS — I10 Essential (primary) hypertension: Secondary | ICD-10-CM

## 2017-10-14 MED ORDER — VALSARTAN 160 MG PO TABS: 160.0000 mg | ORAL_TABLET | Freq: Every day | ORAL | 0 refills | Status: DC

## 2017-10-14 MED ORDER — SPIRONOLACTONE 25 MG PO TABS: 25.0000 mg | ORAL_TABLET | Freq: Every day | ORAL | 0 refills | Status: DC

## 2017-10-17 ENCOUNTER — Encounter: Payer: BC Managed Care – PPO | Admitting: Gastroenterology

## 2017-10-21 ENCOUNTER — Encounter: Payer: Self-pay | Admitting: Family Medicine

## 2017-10-21 ENCOUNTER — Ambulatory Visit (INDEPENDENT_AMBULATORY_CARE_PROVIDER_SITE_OTHER): Payer: BC Managed Care – PPO | Admitting: Family Medicine

## 2017-10-21 VITALS — BP 145/80 | HR 69 | Temp 97.9°F | Resp 16 | Ht 63.0 in | Wt 112.0 lb

## 2017-10-21 DIAGNOSIS — I10 Essential (primary) hypertension: Secondary | ICD-10-CM

## 2017-10-21 DIAGNOSIS — Z0000 Encounter for general adult medical examination without abnormal findings: Secondary | ICD-10-CM | POA: Diagnosis not present

## 2017-10-21 LAB — COMPREHENSIVE METABOLIC PANEL
ALT: 14 U/L (ref 0–35)
AST: 12 U/L (ref 0–37)
Albumin: 4.5 g/dL (ref 3.5–5.2)
Alkaline Phosphatase: 87 U/L (ref 39–117)
BILIRUBIN TOTAL: 0.6 mg/dL (ref 0.2–1.2)
BUN: 11 mg/dL (ref 6–23)
CHLORIDE: 97 meq/L (ref 96–112)
CO2: 32 meq/L (ref 19–32)
Calcium: 9 mg/dL (ref 8.4–10.5)
Creatinine, Ser: 0.62 mg/dL (ref 0.40–1.20)
GFR: 102.82 mL/min (ref >60.00)
GLUCOSE: 101 mg/dL — AB (ref 70–99)
Potassium: 3.8 mEq/L (ref 3.5–5.1)
SODIUM: 134 meq/L — AB (ref 135–145)
Total Protein: 6.9 g/dL (ref 6.0–8.3)

## 2017-10-21 LAB — CBC WITH DIFFERENTIAL/PLATELET
BASOS ABS: 0.1 10*3/uL (ref 0.0–0.1)
Basophils Relative: 0.8 % (ref 0.0–3.0)
EOS ABS: 0.1 10*3/uL (ref 0.0–0.7)
Eosinophils Relative: 1.3 % (ref 0.0–5.0)
HCT: 40.2 % (ref 36.0–46.0)
HEMOGLOBIN: 13.8 g/dL (ref 12.0–15.0)
Lymphocytes Relative: 19 % (ref 12.0–46.0)
Lymphs Abs: 1.2 10*3/uL (ref 0.7–4.0)
MCHC: 34.2 g/dL (ref 30.0–36.0)
MCV: 87.9 fl (ref 78.0–100.0)
MONO ABS: 0.4 10*3/uL (ref 0.1–1.0)
Monocytes Relative: 5.7 % (ref 3.0–12.0)
NEUTROS ABS: 4.6 10*3/uL (ref 1.4–7.7)
Neutrophils Relative %: 73.2 % (ref 43.0–77.0)
PLATELETS: 201 10*3/uL (ref 150.0–400.0)
RBC: 4.58 Mil/uL (ref 3.87–5.11)
RDW: 12.8 % (ref 11.5–15.5)
WBC: 6.3 10*3/uL (ref 4.0–10.5)

## 2017-10-21 LAB — LIPID PANEL
CHOL/HDL RATIO: 2
Cholesterol: 180 mg/dL (ref 0–200)
HDL: 75.3 mg/dL (ref >39.00)
LDL CALC: 95 mg/dL (ref 0–99)
NONHDL: 104.91
Triglycerides: 48 mg/dL (ref 0.0–149.0)
VLDL: 9.6 mg/dL (ref 0.0–40.0)

## 2017-10-21 LAB — TSH: TSH: 0.5 u[IU]/mL (ref 0.35–4.50)

## 2017-10-21 MED ORDER — SPIRONOLACTONE 25 MG PO TABS: 25.0000 mg | ORAL_TABLET | Freq: Every day | ORAL | 3 refills | Status: DC

## 2017-10-21 MED ORDER — VALSARTAN 160 MG PO TABS: 160.0000 mg | ORAL_TABLET | Freq: Every day | ORAL | 3 refills | Status: DC

## 2017-10-21 NOTE — Patient Instructions (Signed)
Preventive Care 40-64 Years, Female Preventive care refers to lifestyle choices and visits with your health care provider that can promote health and wellness. What does preventive care include?  A yearly physical exam. This is also called an annual well check.  Dental exams once or twice a year.  Routine eye exams. Ask your health care provider how often you should have your eyes checked.  Personal lifestyle choices, including: ? Daily care of your teeth and gums. ? Regular physical activity. ? Eating a healthy diet. ? Avoiding tobacco and drug use. ? Limiting alcohol use. ? Practicing safe sex. ? Taking low-dose aspirin daily starting at age 58. ? Taking vitamin and mineral supplements as recommended by your health care provider. What happens during an annual well check? The services and screenings done by your health care provider during your annual well check will depend on your age, overall health, lifestyle risk factors, and family history of disease. Counseling Your health care provider may ask you questions about your:  Alcohol use.  Tobacco use.  Drug use.  Emotional well-being.  Home and relationship well-being.  Sexual activity.  Eating habits.  Work and work Statistician.  Method of birth control.  Menstrual cycle.  Pregnancy history.  Screening You may have the following tests or measurements:  Height, weight, and BMI.  Blood pressure.  Lipid and cholesterol levels. These may be checked every 5 years, or more frequently if you are over 81 years old.  Skin check.  Lung cancer screening. You may have this screening every year starting at age 78 if you have a 30-pack-year history of smoking and currently smoke or have quit within the past 15 years.  Fecal occult blood test (FOBT) of the stool. You may have this test every year starting at age 65.  Flexible sigmoidoscopy or colonoscopy. You may have a sigmoidoscopy every 5 years or a colonoscopy  every 10 years starting at age 30.  Hepatitis C blood test.  Hepatitis B blood test.  Sexually transmitted disease (STD) testing.  Diabetes screening. This is done by checking your blood sugar (glucose) after you have not eaten for a while (fasting). You may have this done every 1-3 years.  Mammogram. This may be done every 1-2 years. Talk to your health care provider about when you should start having regular mammograms. This may depend on whether you have a family history of breast cancer.  BRCA-related cancer screening. This may be done if you have a family history of breast, ovarian, tubal, or peritoneal cancers.  Pelvic exam and Pap test. This may be done every 3 years starting at age 80. Starting at age 36, this may be done every 5 years if you have a Pap test in combination with an HPV test.  Bone density scan. This is done to screen for osteoporosis. You may have this scan if you are at high risk for osteoporosis.  Discuss your test results, treatment options, and if necessary, the need for more tests with your health care provider. Vaccines Your health care provider may recommend certain vaccines, such as:  Influenza vaccine. This is recommended every year.  Tetanus, diphtheria, and acellular pertussis (Tdap, Td) vaccine. You may need a Td booster every 10 years.  Varicella vaccine. You may need this if you have not been vaccinated.  Zoster vaccine. You may need this after age 5.  Measles, mumps, and rubella (MMR) vaccine. You may need at least one dose of MMR if you were born in  1957 or later. You may also need a second dose.  Pneumococcal 13-valent conjugate (PCV13) vaccine. You may need this if you have certain conditions and were not previously vaccinated.  Pneumococcal polysaccharide (PPSV23) vaccine. You may need one or two doses if you smoke cigarettes or if you have certain conditions.  Meningococcal vaccine. You may need this if you have certain  conditions.  Hepatitis A vaccine. You may need this if you have certain conditions or if you travel or work in places where you may be exposed to hepatitis A.  Hepatitis B vaccine. You may need this if you have certain conditions or if you travel or work in places where you may be exposed to hepatitis B.  Haemophilus influenzae type b (Hib) vaccine. You may need this if you have certain conditions.  Talk to your health care provider about which screenings and vaccines you need and how often you need them. This information is not intended to replace advice given to you by your health care provider. Make sure you discuss any questions you have with your health care provider. Document Released: 03/07/2015 Document Revised: 10/29/2015 Document Reviewed: 12/10/2014 Elsevier Interactive Patient Education  2018 Elsevier Inc.  

## 2017-10-21 NOTE — Progress Notes (Signed)
Subjective:     [name] is a 62 y.o. female and is here for a comprehensive physical exam. The patient reports no problems.  Social History   Socioeconomic History  . Marital status: Married    Spouse name: Not on file  . Number of children: 3  . Years of education: Not on file  . Highest education level: Not on file  Occupational History  . Occupation: southern Science writer: [employer]  Social Needs  . Financial resource strain: Not on file  . Food insecurity:    Worry: Not on file    Inability: Not on file  . Transportation needs:    Medical: Not on file    Non-medical: Not on file  Tobacco Use  . Smoking status: Former Smoker    Types: Cigarettes    Last attempt to quit: 07/11/1982    Years since quitting: 35.3  . Smokeless tobacco: Never Used  Substance and Sexual Activity  . Alcohol use: Yes    Alcohol/week: 0.0 standard drinks    Comment: rare  . Drug use: No  . Sexual activity: Yes    Partners: Male    Birth control/protection: Post-menopausal    Comment: 1st intercourse 62 yo-Fewer than 5 partners  Lifestyle  . Physical activity:    Days per week: Not on file    Minutes per session: Not on file  . Stress: Not on file  Relationships  . Social connections:    Talks on phone: Not on file    Gets together: Not on file    Attends religious service: Not on file    Active member of club or organization: Not on file    Attends meetings of clubs or organizations: Not on file    Relationship status: Not on file  . Intimate partner violence:    Fear of current or ex partner: Not on file    Emotionally abused: Not on file    Physically abused: Not on file    Forced sexual activity: Not on file  Other Topics Concern  . Not on file  Social History Narrative   Exercise-- jazzersize  3x a week    Health Maintenance  Topic Date Due  . COLONOSCOPY  06/06/2017  . INFLUENZA VACCINE  09/22/2017  . HIV Screening  10/22/2023  (Originally 03/31/1970)  . TETANUS/TDAP  04/18/2018  . DEXA SCAN  06/01/2018  . MAMMOGRAM  06/22/2018  . PAP SMEAR  10/19/2019  . Hepatitis C Screening  Completed    The following portions of the patient's history were reviewed and updated as appropriate:  She  has a past medical history of Allergy, Cataract, H/O Bell's palsy (05/2010), Hypertension, and Spontaneous pneumothorax (2005). She does not have any pertinent problems on file. She  has a past surgical history that includes Chest tube insertion (2005); Video assisted thoracoscopy (2005); Cesarean section; spirometry (01/20/2015); Colonoscopy; and Wisdom tooth extraction. Her family history includes Cancer (age of onset: 31) in her maternal grandmother; Diabetes in her maternal grandmother; Heart disease in her father; Hypertension in her father and mother; Stroke in her mother. She  reports that she quit smoking about 35 years ago. Her smoking use included cigarettes. She has never used smokeless tobacco. She reports that she drinks alcohol. She reports that she does not use drugs. She has a current medication list which includes the following prescription(s): albuterol, azelastine, beclomethasone, calcium carbonate-vitamin d, fluticasone, levocetirizine, montelukast, omega-3 fatty acids, sodium chloride,  spironolactone, and valsartan, and the following Facility-Administered Medications: gi cocktail. Current Outpatient Medications on File Prior to Visit  Medication Sig Dispense Refill  . albuterol (PROVENTIL) (2.5 MG/3ML) 0.083% nebulizer solution Take 2.5 mg by nebulization every 6 (six) hours as needed for wheezing or shortness of breath.    Marland Kitchen azelastine (ASTELIN) 0.1 % nasal spray Place 1 spray into both nostrils 2 (two) times daily.  3  . beclomethasone (QVAR REDIHALER) 80 MCG/ACT inhaler Inhale into the lungs 2 (two) times daily.    . Calcium Carbonate-Vitamin D (CALCIUM-VITAMIN D3 PO) Take by mouth.    . fluticasone (FLONASE) 50  MCG/ACT nasal spray Place 1 spray into both nostrils daily.    Marland Kitchen levocetirizine (XYZAL) 5 MG tablet Take 5 mg by mouth every evening.    . montelukast (SINGULAIR) 10 MG tablet Take 10 mg by mouth at bedtime.    . Omega-3 Fatty Acids (OMEGA-3 FISH OIL PO) Take by mouth.    . sodium chloride (OCEAN) 0.65 % SOLN nasal spray Place 1 spray into both nostrils as needed for congestion.     Current Facility-Administered Medications on File Prior to Visit  Medication Dose Route Frequency Provider Last Rate Last Dose  . gi cocktail (Maalox,Lidocaine,Donnatal)  30 mL Oral Once Roma Schanz R, DO       She has No Known Allergies..  Review of Systems Review of Systems  Constitutional: Negative for activity change, appetite change and fatigue.  HENT: Negative for hearing loss, congestion, tinnitus and ear discharge.  dentist q68m Eyes: Negative for visual disturbance (see optho q1y -- vision corrected to 20/20 with glasses).  Respiratory: Negative for cough, chest tightness and shortness of breath.   Cardiovascular: Negative for chest pain, palpitations and leg swelling.  Gastrointestinal: Negative for abdominal pain, diarrhea, constipation and abdominal distention.  Genitourinary: Negative for urgency, frequency, decreased urine volume and difficulty urinating.  Musculoskeletal: Negative for back pain, arthralgias and gait problem.  Skin: Negative for color change, pallor and rash.  Neurological: Negative for dizziness, light-headedness, numbness and headaches.  Hematological: Negative for adenopathy. Does not bruise/bleed easily.  Psychiatric/Behavioral: Negative for suicidal ideas, confusion, sleep disturbance, self-injury, dysphoric mood, decreased concentration and agitation.       Objective:    BP (!) 145/80   Pulse 69   Temp 97.9 F (36.6 C) (Oral)   Resp 16   Ht 5\' 3"  (1.6 m)   Wt 112 lb (50.8 kg)   SpO2 100%   BMI 19.84 kg/m  General appearance: alert, cooperative, appears  stated age and no distress Head: Normocephalic, without obvious abnormality, atraumatic Eyes: negative findings: lids and lashes normal, conjunctivae and sclerae normal and pupils equal, round, reactive to light and accomodation Ears: normal TM's and external ear canals both ears Nose: Nares normal. Septum midline. Mucosa normal. No drainage or sinus tenderness. Throat: lips, mucosa, and tongue normal; teeth and gums normal Neck: no adenopathy, no carotid bruit, no JVD, supple, symmetrical, trachea midline and thyroid not enlarged, symmetric, no tenderness/mass/nodules Back: symmetric, no curvature. ROM normal. No CVA tenderness. Lungs: clear to auscultation bilaterally Breasts: gyn Heart: regular rate and rhythm, S1, S2 normal, no murmur, click, rub or gallop Abdomen: soft, non-tender; bowel sounds normal; no masses,  no organomegaly Pelvic: deferred--gyn Extremities: extremities normal, atraumatic, no cyanosis or edema Pulses: 2+ and symmetric Skin: Skin color, texture, turgor normal. No rashes or lesions Lymph nodes: Cervical, supraclavicular, and axillary nodes normal. Neurologic: Alert and oriented X 3, normal strength and  tone. Normal symmetric reflexes. Normal coordination and gait    Assessment:    Healthy female exam.      Plan:    ghm utd Check labs See After Visit Summary for Counseling Recommendations   1. Preventative health care See above - CBC with Differential/Platelet - Comprehensive metabolic panel - Lipid panel - TSH  2. Essential hypertension Well controlled, no changes to meds. Encouraged heart healthy diet such as the DASH diet and exercise as tolerated. -- pt has not taken med today - CBC with Differential/Platelet - Comprehensive metabolic panel - Lipid panel - TSH - valsartan (DIOVAN) 160 MG tablet; Take 1 tablet (160 mg total) by mouth daily.  Dispense: 90 tablet; Refill: 3 - spironolactone (ALDACTONE) 25 MG tablet; Take 1 tablet (25 mg total) by  mouth daily.  Dispense: 90 tablet; Refill: 3

## 2017-10-26 ENCOUNTER — Ambulatory Visit (AMBULATORY_SURGERY_CENTER): Payer: BC Managed Care – PPO | Admitting: Gastroenterology

## 2017-10-26 ENCOUNTER — Encounter: Payer: Self-pay | Admitting: Gastroenterology

## 2017-10-26 VITALS — BP 135/79 | HR 60 | Temp 98.0°F | Resp 18 | Ht 63.0 in | Wt 113.0 lb

## 2017-10-26 DIAGNOSIS — Z1211 Encounter for screening for malignant neoplasm of colon: Secondary | ICD-10-CM | POA: Diagnosis not present

## 2017-10-26 MED ORDER — SODIUM CHLORIDE 0.9 % IV SOLN: 500.0000 mL | Freq: Once | INTRAVENOUS | Status: DC

## 2017-10-26 NOTE — Progress Notes (Signed)
A and O x3. Report to RN. Tolerated MAC anesthesia well.

## 2017-10-26 NOTE — Progress Notes (Signed)
Pt's states no medical or surgical changes since previsit or office visit. 

## 2017-10-26 NOTE — Op Note (Signed)
[facility] Patient Name: [name] Procedure Date: 10/26/2017 11:18 AM MRN: [id number] Endoscopist: Mauri Pole , MD Age: 62 Referring MD:  Date of Birth: Jun 06, 1955 Gender: Female Account #: [id number] Procedure:                Colonoscopy Indications:              Screening for colorectal malignant neoplasm Medicines:                Monitored Anesthesia Care Procedure:                Pre-Anesthesia Assessment:                           - Prior to the procedure, a History and Physical                            was performed, and patient medications and                            allergies were reviewed. The patient's tolerance of                            previous anesthesia was also reviewed. The risks                            and benefits of the procedure and the sedation                            options and risks were discussed with the patient.                            All questions were answered, and informed consent                            was obtained. Prior Anticoagulants: The patient has                            taken no previous anticoagulant or antiplatelet                            agents. ASA Grade Assessment: II - A patient with                            mild systemic disease. After reviewing the risks                            and benefits, the patient was deemed in                            satisfactory condition to undergo the procedure.                           After obtaining informed consent, the colonoscope  was passed under direct vision. Throughout the                            procedure, the patient's blood pressure, pulse, and                            oxygen saturations were monitored continuously. The                            Colonoscope was introduced through the anus and                            advanced to the the cecum, identified by                            appendiceal orifice and  ileocecal valve. The                            colonoscopy was performed without difficulty. The                            patient tolerated the procedure well. The quality                            of the bowel preparation was good. The ileocecal                            valve, appendiceal orifice, and rectum were                            photographed. Scope In: 11:31:29 AM Scope Out: 11:47:28 AM Scope Withdrawal Time: 0 hours 10 minutes 10 seconds  Total Procedure Duration: 0 hours 15 minutes 59 seconds  Findings:                 The perianal and digital rectal examinations were                            normal.                           A few small-mouthed diverticula were found in the                            sigmoid colon.                           Non-bleeding internal hemorrhoids were found during                            retroflexion. The hemorrhoids were small. Complications:            No immediate complications. Estimated Blood Loss:     Estimated blood loss: none. Impression:               - Diverticulosis in the sigmoid colon.                           -  Non-bleeding internal hemorrhoids.                           - No specimens collected. Recommendation:           - Patient has a contact number available for                            emergencies. The signs and symptoms of potential                            delayed complications were discussed with the                            patient. Return to normal activities tomorrow.                            Written discharge instructions were provided to the                            patient.                           - Resume previous diet.                           - Continue present medications.                           - Repeat colonoscopy in 10 years for screening                            purposes. Mauri Pole, MD 10/26/2017 11:52:09 AM This report has been signed electronically.

## 2017-10-26 NOTE — Patient Instructions (Signed)
YOU HAD AN ENDOSCOPIC PROCEDURE TODAY AT [facility]:   Refer to the procedure report that was given to you for any specific questions about what was found during the examination.  If the procedure report does not answer your questions, please call your gastroenterologist to clarify.  If you requested that your care partner not be given the details of your procedure findings, then the procedure report has been included in a sealed envelope for you to review at your convenience later.  YOU SHOULD EXPECT: Some feelings of bloating in the abdomen. Passage of more gas than usual.  Walking can help get rid of the air that was put into your GI tract during the procedure and reduce the bloating. If you had a lower endoscopy (such as a colonoscopy or flexible sigmoidoscopy) you may notice spotting of blood in your stool or on the toilet paper. If you underwent a bowel prep for your procedure, you may not have a normal bowel movement for a few days.  Please Note:  You might notice some irritation and congestion in your nose or some drainage.  This is from the oxygen used during your procedure.  There is no need for concern and it should clear up in a day or so.  SYMPTOMS TO REPORT IMMEDIATELY:   Following lower endoscopy (colonoscopy or flexible sigmoidoscopy):  Excessive amounts of blood in the stool  Significant tenderness or worsening of abdominal pains  Swelling of the abdomen that is new, acute  Fever of 100F or higher  For urgent or emergent issues, a gastroenterologist can be reached at any hour by calling [phone].   DIET:  We do recommend a small meal at first, but then you may proceed to your regular diet.  Drink plenty of fluids but you should avoid alcoholic beverages for 24 hours.  ACTIVITY:  You should plan to take it easy for the rest of today and you should NOT DRIVE or use heavy machinery until tomorrow (because of the sedation medicines used during the test).     FOLLOW UP: Our staff will call the number listed on your records the next business day following your procedure to check on you and address any questions or concerns that you may have regarding the information given to you following your procedure. If we do not reach you, we will leave a message.  However, if you are feeling well and you are not experiencing any problems, there is no need to return our call.  We will assume that you have returned to your regular daily activities without incident.  If any biopsies were taken you will be contacted by phone or by letter within the next 1-3 weeks.  Please call us at [phone] if you have not heard about the biopsies in 3 weeks.   Repeat Colonoscopy in 10 years Hemorrhoids (handout given) Diverticulosis (handout given)  SIGNATURES/CONFIDENTIALITY: You and/or your care partner have signed paperwork which will be entered into your electronic medical record.  These signatures attest to the fact that that the information above on your After Visit Summary has been reviewed and is understood.  Full responsibility of the confidentiality of this discharge information lies with you and/or your care-partner.

## 2017-10-27 ENCOUNTER — Telehealth: Payer: Self-pay | Admitting: *Deleted

## 2017-10-27 NOTE — Telephone Encounter (Signed)
  Follow up Call-  Call back number 10/26/2017  Post procedure Call Back phone  # [phone]  Permission to leave phone message Yes  Some recent data might be hidden     Patient questions:  Do you have a fever, pain , or abdominal swelling? No. Pain Score  0 *  Have you tolerated food without any problems? Yes.    Have you been able to return to your normal activities? Yes.    Do you have any questions about your discharge instructions: Diet   No. Medications  No. Follow up visit  No.  Do you have questions or concerns about your Care? No.  Actions: * If pain score is 4 or above: No action needed, pain <4.

## 2017-12-07 ENCOUNTER — Encounter: Payer: Self-pay | Admitting: *Deleted

## 2018-01-02 ENCOUNTER — Encounter: Payer: Self-pay | Admitting: Medical

## 2018-01-02 ENCOUNTER — Ambulatory Visit (HOSPITAL_BASED_OUTPATIENT_CLINIC_OR_DEPARTMENT_OTHER)
Admission: RE | Admit: 2018-01-02 | Discharge: 2018-01-02 | Disposition: A | Discharge status: Home / Self Care | Payer: BC Managed Care – PPO | Source: Ambulatory Visit | Attending: Medical | Admitting: Medical

## 2018-01-02 ENCOUNTER — Ambulatory Visit: Payer: BC Managed Care – PPO | Admitting: Medical

## 2018-01-02 VITALS — BP 143/81 | HR 67 | Temp 98.2°F | Resp 16 | Ht 63.0 in | Wt 115.0 lb

## 2018-01-02 DIAGNOSIS — J0100 Acute maxillary sinusitis, unspecified: Secondary | ICD-10-CM | POA: Diagnosis not present

## 2018-01-02 DIAGNOSIS — R05 Cough: Secondary | ICD-10-CM

## 2018-01-02 DIAGNOSIS — R059 Cough, unspecified: Secondary | ICD-10-CM

## 2018-01-02 DIAGNOSIS — R0789 Other chest pain: Secondary | ICD-10-CM

## 2018-01-02 DIAGNOSIS — J40 Bronchitis, not specified as acute or chronic: Secondary | ICD-10-CM

## 2018-01-02 LAB — TROPONIN I: TNIDX: 0 ug/l (ref 0.00–0.06)

## 2018-01-02 MED ORDER — DOXYCYCLINE HYCLATE 100 MG PO TABS: 100.0000 mg | ORAL_TABLET | Freq: Two times a day (BID) | ORAL | 0 refills | Status: DC

## 2018-01-02 MED ORDER — METHYLPREDNISOLONE ACETATE 40 MG/ML IJ SUSP
40.0000 mg | Freq: Once | INTRAMUSCULAR | Status: AC
  Administered 2018-01-02: 40 mg via INTRAMUSCULAR

## 2018-01-02 MED ORDER — PREDNISONE 10 MG PO TABS: ORAL_TABLET | ORAL | 0 refills | Status: DC

## 2018-01-02 MED ORDER — BENZONATATE 100 MG PO CAPS: 100.0000 mg | ORAL_CAPSULE | Freq: Three times a day (TID) | ORAL | 0 refills | Status: DC | PRN

## 2018-01-02 NOTE — Patient Instructions (Addendum)
You do appear to have sinusitis and bronchitis type symptoms.  I am going to prescribe doxycycline antibiotic.  Rx advisement given.  For cough I prescribed benzonatate.  You did report some improvement with Qvar yesterday.  She will continue Qvar and remind her to use albuterol as needed for wheezing or shortness of breath.(2 puffs every 6 hours if needed.)  Also we gave you Depo-Medrol 40 mg IM injection today.  Also recommend a 5-day taper dose of prednisone.  You had some description today of chest tightness that was improved with Qvar.  You had this sensation was yesterday.  No other associated symptoms reported.  Believe respiratory type complaint rather than cardiac.  But we did do an EKG and it showed no acute ischemic changes(sinus rthym. RBB as before. Did not see ectopic beat read by machine).  For caution sake will get troponin.  If elevation then will advise emergency department.  Follow-up in 7 to 10 days or as needed.

## 2018-01-02 NOTE — Progress Notes (Signed)
Subjective:    Patient ID: [name], female    DOB: May 10, 1955, 62 y.o.   MRN: [id number]  HPI  Pt in with some chest congestion and nasal congestion for about 4-5 days.   Pt states on 2 nasal sprays both flonase and astelin. She is also on montelukast and xyzal.  Yesterday her sinus hurting on rt side.   Pt has some mild chest tightness/pressure sensation for last day. She notes after taking qvar and tightness eased up. Pt did not use any albuterol.    No jaw pain, no shoulder pain, no arm pain, sob or sweating.      Review of Systems  Constitutional: Negative for chills, fatigue and fever.  HENT: Positive for congestion, sinus pressure and sinus pain. Negative for dental problem and sore throat.   Respiratory: Positive for cough and chest tightness. Negative for apnea, shortness of breath and wheezing.   Cardiovascular: Negative for chest pain and palpitations.  Gastrointestinal: Negative for abdominal pain.  Musculoskeletal: Negative for back pain.  Skin: Negative for rash.  Neurological: Negative for dizziness, seizures, syncope, weakness and light-headedness.  Hematological: Negative for adenopathy. Does not bruise/bleed easily.  Psychiatric/Behavioral: Negative for behavioral problems and confusion.    Past Medical History:  Diagnosis Date  . Allergy   . Cataract   . H/O Bell's palsy 05/2010  . Hypertension   . Spontaneous pneumothorax 2005   R, s/p tube, then VATS     Social History   Socioeconomic History  . Marital status: Married    Spouse name: Not on file  . Number of children: 3  . Years of education: Not on file  . Highest education level: Not on file  Occupational History  . 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 Financial resource strain: Not on file  . Food insecurity:    Worry: Not on file    Inability: Not on file  . Transportation needs:    Medical: Not on file   Non-medical: Not on file  Tobacco Use  . Smoking status: Former Smoker    Types: Cigarettes    Last attempt to quit: 07/11/1982    Years since quitting: 35.5  . Smokeless tobacco: Never Used  Substance and Sexual Activity  . Alcohol use: Yes    Alcohol/week: 0.0 standard drinks    Comment: rare  . Drug use: No  . Sexual activity: Yes    Partners: Male    Birth control/protection: Post-menopausal    Comment: 1st intercourse 62 yo-Fewer than 5 partners  Lifestyle  . Physical activity:    Days per week: Not on file    Minutes per session: Not on file  . Stress: Not on file  Relationships  . Social connections:    Talks on phone: Not on file    Gets together: Not on file    Attends religious service: Not on file    Active member of club or organization: Not on file    Attends meetings of clubs or organizations: Not on file    Relationship status: Not on file  . Intimate partner violence:    Fear of current or ex partner: Not on file    Emotionally abused: Not on file    Physically abused: Not on file    Forced sexual activity: Not on file  Other Topics Concern  . Not on file  Social History Narrative   Exercise-- jazzersize  3x  a week     Past Surgical History:  Procedure Laterality Date  . CESAREAN SECTION    . CHEST TUBE INSERTION  2005   spontaneus Pneumothorax  . COLONOSCOPY    . SPIROMETRY  01/20/2015   Dr. Donneta Romberg  . VIDEO ASSISTED THORACOSCOPY  2005  . WISDOM TOOTH EXTRACTION      Family History  Problem Relation Age of Onset  . Hypertension Mother   . Stroke Mother   . Heart disease Father   . Hypertension Father   . Cancer Maternal Grandmother 72       breast  . Diabetes Maternal Grandmother   . Colon cancer Neg Hx   . Esophageal cancer Neg Hx   . Rectal cancer Neg Hx   . Stomach cancer Neg Hx     No Known Allergies  Current Outpatient Medications on File Prior to Visit  Medication Sig Dispense Refill  . albuterol (PROVENTIL) (2.5 MG/3ML)  0.083% nebulizer solution Take 2.5 mg by nebulization every 6 (six) hours as needed for wheezing or shortness of breath.    Marland Kitchen azelastine (ASTELIN) 0.1 % nasal spray Place 1 spray into both nostrils 2 (two) times daily.  3  . beclomethasone (QVAR REDIHALER) 80 MCG/ACT inhaler Inhale into the lungs 2 (two) times daily.    . Calcium Carbonate-Vitamin D (CALCIUM-VITAMIN D3 PO) Take by mouth.    . fluticasone (FLONASE) 50 MCG/ACT nasal spray Place 1 spray into both nostrils daily.    Marland Kitchen levocetirizine (XYZAL) 5 MG tablet Take 5 mg by mouth every evening.    . montelukast (SINGULAIR) 10 MG tablet Take 10 mg by mouth at bedtime.    . Omega-3 Fatty Acids (OMEGA-3 FISH OIL PO) Take by mouth.    . sodium chloride (OCEAN) 0.65 % SOLN nasal spray Place 1 spray into both nostrils as needed for congestion.    Marland Kitchen spironolactone (ALDACTONE) 25 MG tablet Take 1 tablet (25 mg total) by mouth daily. 90 tablet 3  . valsartan (DIOVAN) 160 MG tablet Take 1 tablet (160 mg total) by mouth daily. 90 tablet 3   Current Facility-Administered Medications on File Prior to Visit  Medication Dose Route Frequency Provider Last Rate Last Dose  . 0.9 %  sodium chloride infusion  500 mL Intravenous Once Nandigam, Kavitha V, MD      . gi cocktail (Maalox,Lidocaine,Donnatal)  30 mL Oral Once Lowne Chase, Yvonne R, DO        BP (!) 143/81   Pulse 67   Temp 98.2 F (36.8 C) (Oral)   Resp 16   Ht 5\' 3"  (1.6 m)   Wt 115 lb (52.2 kg)   SpO2 100%   BMI 20.37 kg/m       Objective:   Physical Exam  General  Mental Status - Alert. General Appearance - Well groomed. Not in acute distress.  Skin Rashes- No Rashes.  HEENT Head- Normal. Ear Auditory Canal - Left- Normal. Right - Normal.Tympanic Membrane- Left- Normal. Right- Normal. Eye Sclera/Conjunctiva- Left- Normal. Right- Normal. Nose & Sinuses Nasal Mucosa- Left-  Boggy and Congested. Right-  Boggy and  Congested.Bilateral left side. maxillary and frontal sinus  pressure. Mouth & Throat Lips: Upper Lip- Normal: no dryness, cracking, pallor, cyanosis, or vesicular eruption. Lower Lip-Normal: no dryness, cracking, pallor, cyanosis or vesicular eruption. Buccal Mucosa- Bilateral- No Aphthous ulcers. Oropharynx- No Discharge or Erythema. Tonsils: Characteristics- Bilateral- No Erythema or Congestion. Size/Enlargement- Bilateral- No enlargement. Discharge- bilateral-None.  Neck Neck- Supple. No Masses.  Chest and Lung Exam Auscultation: Breath Sounds:-Clear even and unlabored.  Cardiovascular Auscultation:Rythm- Regular, rate and rhythm. Murmurs & Other Heart Sounds:Ausculatation of the heart reveal- No Murmurs.  Lymphatic Head & Neck General Head & Neck Lymphatics: Bilateral: Description- No Localized lymphadenopathy.  Anterior thorax- no tenderness to palpation over costochondral junction.       Assessment & Plan:  You do appear to have sinusitis and bronchitis type symptoms.  I am going to prescribe doxycycline antibiotic.  Rx advisement given.  For cough I prescribed benzonatate.  You did report some improvement with Qvar yesterday.  She will continue Qvar and remind her to use albuterol as needed for wheezing or shortness of breath.(2 puffs every 6 hours if needed.)  Also we gave you Depo-Medrol 40 mg IM injection today.  Also recommend a 5-day taper dose of prednisone.  You had some description today of chest tightness that was improved with Qvar.  You had  this sensation was yesterday.  No other associated symptoms reported.  Believe respiratory type complaint rather than cardiac.  But we did do an EKG and it showed no acute ischemic changes.  For caution sake will get troponin.  If elevation then will advise emergency department.  Follow-up in 7 to 10 days or as needed.  Mackie Pai, PA-C

## 2018-07-12 LAB — HM MAMMOGRAPHY

## 2018-09-15 ENCOUNTER — Encounter: Payer: Self-pay | Admitting: Family Medicine

## 2018-11-03 ENCOUNTER — Encounter: Payer: Self-pay | Admitting: Family Medicine

## 2018-11-03 ENCOUNTER — Other Ambulatory Visit: Payer: Self-pay

## 2018-11-03 ENCOUNTER — Ambulatory Visit (INDEPENDENT_AMBULATORY_CARE_PROVIDER_SITE_OTHER): Payer: Medicare Other | Admitting: Family Medicine

## 2018-11-03 VITALS — BP 140/80 | HR 73 | Temp 97.8°F | Resp 18 | Ht 63.0 in | Wt 115.8 lb

## 2018-11-03 DIAGNOSIS — Z23 Encounter for immunization: Secondary | ICD-10-CM

## 2018-11-03 DIAGNOSIS — Z0000 Encounter for general adult medical examination without abnormal findings: Secondary | ICD-10-CM

## 2018-11-03 DIAGNOSIS — I10 Essential (primary) hypertension: Secondary | ICD-10-CM | POA: Diagnosis not present

## 2018-11-03 LAB — CBC WITH DIFFERENTIAL/PLATELET
Basophils Absolute: 0 10*3/uL (ref 0.0–0.1)
Basophils Relative: 0.8 % (ref 0.0–3.0)
Eosinophils Absolute: 0.1 10*3/uL (ref 0.0–0.7)
Eosinophils Relative: 1.6 % (ref 0.0–5.0)
HCT: 36.6 % (ref 36.0–46.0)
Hemoglobin: 12.5 g/dL (ref 12.0–15.0)
Lymphocytes Relative: 29.7 % (ref 12.0–46.0)
Lymphs Abs: 1.7 10*3/uL (ref 0.7–4.0)
MCHC: 34.3 g/dL (ref 30.0–36.0)
MCV: 87.5 fl (ref 78.0–100.0)
Monocytes Absolute: 0.4 10*3/uL (ref 0.1–1.0)
Monocytes Relative: 6.4 % (ref 3.0–12.0)
Neutro Abs: 3.6 10*3/uL (ref 1.4–7.7)
Neutrophils Relative %: 61.5 % (ref 43.0–77.0)
Platelets: 204 10*3/uL (ref 150.0–400.0)
RBC: 4.19 Mil/uL (ref 3.87–5.11)
RDW: 12.7 % (ref 11.5–15.5)
WBC: 5.9 10*3/uL (ref 4.0–10.5)

## 2018-11-03 LAB — COMPREHENSIVE METABOLIC PANEL
ALT: 19 U/L (ref 0–35)
AST: 16 U/L (ref 0–37)
Albumin: 4.3 g/dL (ref 3.5–5.2)
Alkaline Phosphatase: 87 U/L (ref 39–117)
BUN: 10 mg/dL (ref 6–23)
CO2: 29 mEq/L (ref 19–32)
Calcium: 9.2 mg/dL (ref 8.4–10.5)
Chloride: 98 mEq/L (ref 96–112)
Creatinine, Ser: 0.58 mg/dL (ref 0.40–1.20)
GFR: 104.14 mL/min (ref >60.00)
Glucose, Bld: 93 mg/dL (ref 70–99)
Potassium: 3.4 mEq/L — ABNORMAL LOW (ref 3.5–5.1)
Sodium: 135 mEq/L (ref 135–145)
Total Bilirubin: 0.6 mg/dL (ref 0.2–1.2)
Total Protein: 7 g/dL (ref 6.0–8.3)

## 2018-11-03 LAB — LIPID PANEL
Cholesterol: 177 mg/dL (ref 0–200)
HDL: 70.9 mg/dL (ref >39.00)
LDL Cholesterol: 94 mg/dL (ref 0–99)
NonHDL: 106.43
Total CHOL/HDL Ratio: 3
Triglycerides: 61 mg/dL (ref 0.0–149.0)
VLDL: 12.2 mg/dL (ref 0.0–40.0)

## 2018-11-03 MED ORDER — VALSARTAN 160 MG PO TABS: 160.0000 mg | ORAL_TABLET | Freq: Every day | ORAL | 3 refills | Status: DC

## 2018-11-03 MED ORDER — SPIRONOLACTONE 25 MG PO TABS: 25.0000 mg | ORAL_TABLET | Freq: Every day | ORAL | 3 refills | Status: DC

## 2018-11-03 NOTE — Progress Notes (Addendum)
Subjective:    [name] is a 63 y.o. female who presents for a Welcome to Medicare exam and cpe    Review of Systems  Review of Systems  Constitutional: Negative for activity change, appetite change and fatigue.  HENT: Negative for hearing loss, congestion, tinnitus and ear discharge.   Eyes: Negative for visual disturbance (see optho q1y -- vision corrected to 20/20 with glasses).  Respiratory: Negative for cough, chest tightness and shortness of breath.   Cardiovascular: Negative for chest pain, palpitations and leg swelling.  Gastrointestinal: Negative for abdominal pain, diarrhea, constipation and abdominal distention.  Genitourinary: Negative for urgency, frequency, decreased urine volume and difficulty urinating.  Musculoskeletal: Negative for back pain, arthralgias and gait problem.  Skin: Negative for color change, pallor and rash.  Neurological: Negative for dizziness, light-headedness, numbness and headaches.  Hematological: Negative for adenopathy. Does not bruise/bleed easily.  Psychiatric/Behavioral: Negative for suicidal ideas, confusion, sleep disturbance, self-injury, dysphoric mood, decreased concentration and agitation.  Pt is able to read and write and can do all ADLs No risk for falling No abuse/ violence in home           Objective:    Today's Vitals   11/03/18 1303  BP: 140/80  Pulse: 73  Resp: 18  Temp: 97.8 F (36.6 C)  TempSrc: Temporal  SpO2: 100%  Weight: 115 lb 12.8 oz (52.5 kg)  Height: 5\' 3"  (1.6 m)  Body mass index is 20.51 kg/m.  Medications Outpatient Encounter Medications as of 11/03/2018  Medication Sig  . ADVAIR HFA 115-21 MCG/ACT inhaler INL 2 PFS PO BID  . albuterol (PROVENTIL) (2.5 MG/3ML) 0.083% nebulizer solution Take 2.5 mg by nebulization every 6 (six) hours as needed for wheezing or shortness of breath.  Marland Kitchen azelastine (ASTELIN) 0.1 % nasal spray Place 1 spray into both nostrils 2 (two) times daily.  . Calcium  Carbonate-Vitamin D (CALCIUM-VITAMIN D3 PO) Take by mouth.  . fluticasone (FLONASE) 50 MCG/ACT nasal spray Place 1 spray into both nostrils daily.  Marland Kitchen levocetirizine (XYZAL) 5 MG tablet Take 5 mg by mouth every evening.  . montelukast (SINGULAIR) 10 MG tablet Take 10 mg by mouth at bedtime.  . Omega-3 Fatty Acids (OMEGA-3 FISH OIL PO) Take by mouth.  . sodium chloride (OCEAN) 0.65 % SOLN nasal spray Place 1 spray into both nostrils as needed for congestion.  Marland Kitchen spironolactone (ALDACTONE) 25 MG tablet Take 1 tablet (25 mg total) by mouth daily.  . valsartan (DIOVAN) 160 MG tablet Take 1 tablet (160 mg total) by mouth daily.  . [DISCONTINUED] spironolactone (ALDACTONE) 25 MG tablet Take 1 tablet (25 mg total) by mouth daily.  . [DISCONTINUED] valsartan (DIOVAN) 160 MG tablet Take 1 tablet (160 mg total) by mouth daily.  . beclomethasone (QVAR REDIHALER) 80 MCG/ACT inhaler Inhale into the lungs 2 (two) times daily.  . [DISCONTINUED] benzonatate (TESSALON) 100 MG capsule Take 1 capsule (100 mg total) by mouth 3 (three) times daily as needed for cough. (Patient not taking: Reported on 11/03/2018)  . [DISCONTINUED] doxycycline (VIBRA-TABS) 100 MG tablet Take 1 tablet (100 mg total) by mouth 2 (two) times daily. Can give capsules or generic. (Patient not taking: Reported on 11/03/2018)  . [DISCONTINUED] predniSONE (DELTASONE) 10 MG tablet 5 TAB PO DAY 1 4 TAB PO DAY 2 3 TAB PO DAY 3 2 TAB PO DAY 4 1 TAB PO DAY 5 (Patient not taking: Reported on 11/03/2018)   Facility-Administered Encounter Medications as of 11/03/2018  Medication  .  0.9 %  sodium chloride infusion  . gi cocktail (Maalox,Lidocaine,Donnatal)     History: Past Medical History:  Diagnosis Date  . Allergy   . Cataract   . H/O Bell's palsy 05/2010  . Hypertension   . Spontaneous pneumothorax 2005   R, s/p tube, then VATS   Past Surgical History:  Procedure Laterality Date  . CESAREAN SECTION    . CHEST TUBE INSERTION  2005    spontaneus Pneumothorax  . COLONOSCOPY    . SPIROMETRY  01/20/2015   Dr. Donneta Romberg  . VIDEO ASSISTED THORACOSCOPY  2005  . WISDOM TOOTH EXTRACTION      Family History  Problem Relation Age of Onset  . Hypertension Mother   . Stroke Mother   . Heart disease Father   . Hypertension Father   . Cancer Maternal Grandmother 72       breast  . Diabetes Maternal Grandmother   . Colon cancer Neg Hx   . Esophageal cancer Neg Hx   . Rectal cancer Neg Hx   . Stomach cancer Neg Hx    Social History   Occupational History  . 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 Smoking status: Former Smoker    Types: Cigarettes    Quit date: 07/11/1982    Years since quitting: 36.3  . Smokeless tobacco: Never Used  Substance and Sexual Activity  . Alcohol use: Yes    Alcohol/week: 0.0 standard drinks    Comment: rare  . Drug use: No  . Sexual activity: Yes    Partners: Male    Birth control/protection: Post-menopausal    Comment: 1st intercourse 63 yo-Fewer than 5 partners    Tobacco Counseling Counseling given: No   Immunizations and Health Maintenance Immunization History  Administered Date(s) Administered  . Influenza Whole 11/30/2006, 12/13/2007, 01/01/2009, 12/31/2009  . Influenza,inj,Quad PF,6+ Mos 11/27/2017  . Influenza-Unspecified 10/24/2013, 11/28/2016  . Td 08/28/2002, 04/18/2008  . Zoster 08/20/2013  . Zoster Recombinat (Shingrix) 11/28/2016, 02/04/2017   Health Maintenance Due  Topic Date Due  . PNA vac Low Risk Adult (1 of 2 - PCV13) 03/31/2018  . TETANUS/TDAP  04/18/2018  . DEXA SCAN  06/01/2018  . INFLUENZA VACCINE  09/23/2018    Activities of Daily Living In your present state of health, do you have any difficulty performing the following activities: 11/03/2018  Hearing? N  Vision? N  Difficulty concentrating or making decisions? N  Walking or climbing stairs? N  Dressing or bathing? N  Doing errands,  shopping? N  Some recent data might be hidden    Physical Exam    BP 140/80 (BP Location: Right Arm, Patient Position: Sitting, Cuff Size: Normal)   Pulse 73   Temp 97.8 F (36.6 C) (Temporal)   Resp 18   Ht 5\' 3"  (1.6 m)   Wt 115 lb 12.8 oz (52.5 kg)   SpO2 100%   BMI 20.51 kg/m  General appearance: alert, cooperative, appears stated age and no distress Head: Normocephalic, without obvious abnormality, atraumatic Eyes: negative findings: lids and lashes normal, conjunctivae and sclerae normal and pupils equal, round, reactive to light and accomodation Ears: normal TM's and external ear canals both ears Nose: Nares normal. Septum midline. Mucosa normal. No drainage or sinus tenderness. Throat: lips, mucosa, and tongue normal; teeth and gums normal Neck: no adenopathy, no carotid bruit, no JVD, supple, symmetrical, trachea midline and thyroid not enlarged, symmetric, no tenderness/mass/nodules Back: symmetric, no curvature.  ROM normal. No CVA tenderness. Lungs: clear to auscultation bilaterally Breasts: normal appearance, no masses or tenderness Heart: regular rate and rhythm, S1, S2 normal, no murmur, click, rub or gallop Abdomen: soft, non-tender; bowel sounds normal; no masses,  no organomegaly Pelvic: not indicated; post-menopausal, no abnormal Pap smears in past Extremities: extremities normal, atraumatic, no cyanosis or edema Pulses: 2+ and symmetric Skin: Skin color, texture, turgor normal. No rashes or lesions Lymph nodes: Cervical, supraclavicular, and axillary nodes normal. Neurologic: Alert and oriented X 3, normal strength and tone. Normal symmetric reflexes. Normal coordination and gait Advanced Directives: Does Patient Have a Medical Advance Directive?: Yes Type of Advance Directive: Living will    Assessment:    This is a routine wellness examination for this patient .   Vision/Hearing screen  Hearing Screening   125Hz  250Hz  500Hz  1000Hz  2000Hz  3000Hz  4000Hz   6000Hz  8000Hz   Right ear:           Left ear:           Comments: Normal whisper   Visual Acuity Screening   Right eye Left eye Both eyes  Without correction:     With correction: 20/20 20/20 20/20     Dietary issues and exercise activities discussed:  Current Exercise Habits: Home exercise routine, Type of exercise: calisthenics, Time (Minutes): 60, Frequency (Times/Week): 4, Weekly Exercise (Minutes/Week): 240, Intensity: Moderate, Exercise limited by: None identified  Goals   None    Depression Screen PHQ 2/9 Scores 11/03/2018 10/21/2017 10/18/2016 01/24/2016  PHQ - 2 Score 0 0 0 0  PHQ- 9 Score 0 1 - -     Fall Risk Fall Risk  11/03/2018  Falls in the past year? 0    Cognitive Function: MMSE - Mini Mental State Exam 11/03/2018  Orientation to time 5  Orientation to Place 5  Registration 3  Attention/ Calculation 5  Recall 3  Language- name 2 objects 2  Language- repeat 1  Language- follow 3 step command 3  Language- read & follow direction 1  Write a sentence 1  Copy design 1  Total score 30        Patient Care Team: Ann Held, DO as PCP - General Fontaine, Belinda Block, MD as Consulting Physician (Gynecology) Mosetta Anis, MD as Referring Physician (Allergy)    ekg-- RBBB--- same as previous ekg Plan:    ghm utd Flu and prevnar given See AVS Check labs   I have personally reviewed and noted the following in the patient's chart:   . Medical and social history . Use of alcohol, tobacco or illicit drugs  . Current medications and supplements . Functional ability and status . Nutritional status . Physical activity . Advanced directives . List of other physicians . Hospitalizations, surgeries, and ER visits in previous 12 months . Vitals . Screenings to include cognitive, depression, and falls . Referrals and appointments  In addition, I have reviewed and discussed with patient certain preventive protocols, quality metrics, and best  practice recommendations. A written personalized care plan for preventive services as well as general preventive health recommendations were provided to patient.    1. Essential hypertension Well controlled, no changes to meds. Encouraged heart healthy diet such as the DASH diet and exercise as tolerated.   - valsartan (DIOVAN) 160 MG tablet; Take 1 tablet (160 mg total) by mouth daily.  Dispense: 90 tablet; Refill: 3 - spironolactone (ALDACTONE) 25 MG tablet; Take 1 tablet (25 mg total) by mouth daily.  Dispense:  90 tablet; Refill: 3 - CBC with Differential/Platelet - Lipid panel - Comprehensive metabolic panel  2. Welcome to Medicare preventive visit See above  - EKG 12-Lead - CBC with Differential/Platelet - Lipid panel - Comprehensive metabolic panel  3. Need for influenza vaccination   - Flu Vaccine QUAD High Dose(Fluad)  4. Need for pneumococcal vaccination   - Pneumococcal conjugate vaccine 13-valent IM  5. Preventative health care See above  6. Encounter for Medicare annual wellness exam    Ann Held, DO 11/03/2018

## 2018-11-03 NOTE — Patient Instructions (Signed)

## 2018-11-29 ENCOUNTER — Encounter: Payer: Self-pay | Admitting: Gynecology

## 2019-01-23 ENCOUNTER — Ambulatory Visit (HOSPITAL_BASED_OUTPATIENT_CLINIC_OR_DEPARTMENT_OTHER)
Admission: RE | Admit: 2019-01-23 | Discharge: 2019-01-23 | Disposition: A | Discharge status: Home / Self Care | Payer: Medicare Other | Source: Ambulatory Visit | Attending: Family Medicine | Admitting: Family Medicine

## 2019-01-23 ENCOUNTER — Other Ambulatory Visit: Payer: Self-pay

## 2019-01-23 ENCOUNTER — Encounter: Payer: Self-pay | Admitting: Family Medicine

## 2019-01-23 ENCOUNTER — Ambulatory Visit (INDEPENDENT_AMBULATORY_CARE_PROVIDER_SITE_OTHER): Payer: Medicare Other | Admitting: Family Medicine

## 2019-01-23 VITALS — BP 140/80 | HR 77 | Temp 97.9°F | Resp 18 | Ht 63.0 in | Wt 117.8 lb

## 2019-01-23 DIAGNOSIS — R05 Cough: Secondary | ICD-10-CM | POA: Insufficient documentation

## 2019-01-23 DIAGNOSIS — R059 Cough, unspecified: Secondary | ICD-10-CM

## 2019-01-23 DIAGNOSIS — M940 Chondrocostal junction syndrome [Tietze]: Secondary | ICD-10-CM

## 2019-01-23 DIAGNOSIS — Z87891 Personal history of nicotine dependence: Secondary | ICD-10-CM | POA: Diagnosis not present

## 2019-01-23 MED ORDER — TIZANIDINE HCL 4 MG PO TABS: 4.0000 mg | ORAL_TABLET | Freq: Four times a day (QID) | ORAL | 0 refills | Status: DC | PRN

## 2019-01-23 NOTE — Progress Notes (Signed)
Patient ID: [name], female    DOB: [birth year]  Age: 63 y.o. MRN: [id number]    Subjective:  Subjective  HPI AIZZA ORLEANS presents for pain in R breast --- or under it and it radiates to R shoulder blade +spasm   Review of Systems  Constitutional: Negative for appetite change, diaphoresis, fatigue and unexpected weight change.  Eyes: Negative for pain, redness and visual disturbance.  Respiratory: Negative for cough, chest tightness, shortness of breath and wheezing.   Cardiovascular: Negative for chest pain, palpitations and leg swelling.  Endocrine: Negative for cold intolerance, heat intolerance, polydipsia, polyphagia and polyuria.  Genitourinary: Negative for difficulty urinating, dysuria and frequency.  Musculoskeletal: Positive for myalgias.  Neurological: Negative for dizziness, light-headedness, numbness and headaches.    History Past Medical History:  Diagnosis Date  . Allergy   . Cataract   . H/O Bell's palsy 05/2010  . Hypertension   . Spontaneous pneumothorax 2005   R, s/p tube, then VATS    She has a past surgical history that includes Chest tube insertion (2005); Video assisted thoracoscopy (2005); Cesarean section; spirometry (01/20/2015); Colonoscopy; and Wisdom tooth extraction.   Her family history includes Cancer (age of onset: 23) in her maternal grandmother; Diabetes in her maternal grandmother; Heart disease in her father; Hypertension in her father and mother; Stroke in her mother.She reports that she quit smoking about 36 years ago. Her smoking use included cigarettes. She has never used smokeless tobacco. She reports current alcohol use. She reports that she does not use drugs.  Current Outpatient Medications on File Prior to Visit  Medication Sig Dispense Refill  . ADVAIR HFA 115-21 MCG/ACT inhaler INL 2 PFS PO BID    . albuterol (PROVENTIL) (2.5 MG/3ML) 0.083% nebulizer solution Take 2.5 mg by nebulization every 6 (six) hours as needed for  wheezing or shortness of breath.    Marland Kitchen azelastine (ASTELIN) 0.1 % nasal spray Place 1 spray into both nostrils 2 (two) times daily.  3  . Calcium Carbonate-Vitamin D (CALCIUM-VITAMIN D3 PO) Take by mouth.    . fluticasone (FLONASE) 50 MCG/ACT nasal spray Place 1 spray into both nostrils daily.    Marland Kitchen levocetirizine (XYZAL) 5 MG tablet Take 5 mg by mouth every evening.    . montelukast (SINGULAIR) 10 MG tablet Take 10 mg by mouth at bedtime.    . Omega-3 Fatty Acids (OMEGA-3 FISH OIL PO) Take by mouth.    . sodium chloride (OCEAN) 0.65 % SOLN nasal spray Place 1 spray into both nostrils as needed for congestion.    Marland Kitchen spironolactone (ALDACTONE) 25 MG tablet Take 1 tablet (25 mg total) by mouth daily. 90 tablet 3  . valsartan (DIOVAN) 160 MG tablet Take 1 tablet (160 mg total) by mouth daily. 90 tablet 3   Current Facility-Administered Medications on File Prior to Visit  Medication Dose Route Frequency Provider Last Rate Last Dose  . 0.9 %  sodium chloride infusion  500 mL Intravenous Once Nandigam, Kavitha V, MD      . gi cocktail (Maalox,Lidocaine,Donnatal)  30 mL Oral Once Carollee Herter, Alferd Apa, DO         Objective:  Objective  Physical Exam Vitals signs and nursing note reviewed.  Constitutional:      Appearance: She is well-developed.  HENT:     Head: Normocephalic and atraumatic.  Eyes:     Conjunctiva/sclera: Conjunctivae normal.  Neck:     Musculoskeletal: Normal range of motion and neck supple.  Thyroid: No thyromegaly.     Vascular: No carotid bruit or JVD.  Cardiovascular:     Rate and Rhythm: Normal rate and regular rhythm.     Heart sounds: Normal heart sounds. No murmur.  Pulmonary:     Effort: Pulmonary effort is normal. No respiratory distress.     Breath sounds: Normal breath sounds. No wheezing or rales.  Chest:     Chest wall: No tenderness.  Musculoskeletal:        General: Tenderness present.  Neurological:     Mental Status: She is alert and oriented to  person, place, and time.    BP 140/80 (BP Location: Right Arm, Patient Position: Sitting, Cuff Size: Normal)   Pulse 77   Temp 97.9 F (36.6 C) (Temporal)   Resp 18   Ht 5\' 3"  (1.6 m)   Wt 117 lb 12.8 oz (53.4 kg)   SpO2 98%   BMI 20.87 kg/m  Wt Readings from Last 3 Encounters:  01/23/19 117 lb 12.8 oz (53.4 kg)  11/03/18 115 lb 12.8 oz (52.5 kg)  01/02/18 115 lb (52.2 kg)     Lab Results  Component Value Date   WBC 5.9 11/03/2018   HGB 12.5 11/03/2018   HCT 36.6 11/03/2018   PLT 204.0 11/03/2018   GLUCOSE 93 11/03/2018   CHOL 177 11/03/2018   TRIG 61.0 11/03/2018   HDL 70.90 11/03/2018   LDLDIRECT 111.7 05/07/2010   LDLCALC 94 11/03/2018   ALT 19 11/03/2018   AST 16 11/03/2018   NA 135 11/03/2018   K 3.4 (L) 11/03/2018   CL 98 11/03/2018   CREATININE 0.58 11/03/2018   BUN 10 11/03/2018   CO2 29 11/03/2018   TSH 0.50 10/21/2017   MICROALBUR 0.2 02/21/2014    Dg Chest 2 View  Result Date: 01/02/2018 CLINICAL DATA:  Cough and congestion. EXAM: CHEST - 2 VIEW COMPARISON:  07/29/2016 FINDINGS: The heart size and mediastinal contours are within normal limits. Both lungs are clear. The visualized skeletal structures are unremarkable. IMPRESSION: No active cardiopulmonary disease. Electronically Signed   By: Kerby Moors M.D.   On: 01/02/2018 11:03     Assessment & Plan:  Plan  I have discontinued Yarethzi A. Farve's beclomethasone. I am also having her start on tiZANidine. Additionally, I am having her maintain her sodium chloride, levocetirizine, albuterol, fluticasone, montelukast, Omega-3 Fatty Acids (OMEGA-3 FISH OIL PO), Calcium Carbonate-Vitamin D (CALCIUM-VITAMIN D3 PO), azelastine, Advair HFA, valsartan, and spironolactone. We will continue to administer gi cocktail and sodium chloride.  Meds ordered this encounter  Medications  . tiZANidine (ZANAFLEX) 4 MG tablet    Sig: Take 1 tablet (4 mg total) by mouth every 6 (six) hours as needed for muscle spasms.     Dispense:  30 tablet    Refill:  0    Problem List Items Addressed This Visit      Unprioritized   Costochondritis - Primary    Muscle relaxers,  Tylenol or IB Heat  pred to try to stop cough      Relevant Medications   tiZANidine (ZANAFLEX) 4 MG tablet   Other Relevant Orders   DG Chest 2 View (HP) (Completed)   Cough    cxr and pred taper  If not improvement CT chest       Relevant Orders   DG Chest 2 View (HP) (Completed)   History of cigarette smoking   Relevant Orders   DG Chest 2 View (HP) (Completed)  Follow-up: Return if symptoms worsen or fail to improve.  Ann Held, DO

## 2019-01-23 NOTE — Patient Instructions (Signed)
Costochondritis  Costochondritis is swelling and irritation (inflammation) of the tissue (cartilage) that connects your ribs to your breastbone (sternum). This causes pain in the front of your chest. The pain usually starts gradually and involves more than one rib. What are the causes? The exact cause of this condition is not always known. It results from stress on the cartilage where your ribs attach to your sternum. The cause of this stress could be:  Chest injury (trauma).  Exercise or activity, such as lifting.  Severe coughing. What increases the risk? You may be at higher risk for this condition if you:  Are female.  Are 30?65 years old.  Recently started a new exercise or work activity.  Have low levels of vitamin D.  Have a condition that makes you cough frequently. What are the signs or symptoms? The main symptom of this condition is chest pain. The pain:  Usually starts gradually and can be sharp or dull.  Gets worse with deep breathing, coughing, or exercise.  Gets better with rest.  May be worse when you press on the sternum-rib connection (tenderness). How is this diagnosed? This condition is diagnosed based on your symptoms, medical history, and a physical exam. Your health care provider will check for tenderness when pressing on your sternum. This is the most important finding. You may also have tests to rule out other causes of chest pain. These may include:  A chest X-ray to check for lung problems.  An electrocardiogram (ECG) to see if you have a heart problem that could be causing the pain.  An imaging scan to rule out a chest or rib fracture. How is this treated? This condition usually goes away on its own over time. Your health care provider may prescribe an NSAID to reduce pain and inflammation. Your health care provider may also suggest that you:  Rest and avoid activities that make pain worse.  Apply heat or cold to the area to reduce pain and  inflammation.  Do exercises to stretch your chest muscles. If these treatments do not help, your health care provider may inject a numbing medicine at the sternum-rib connection to help relieve the pain. Follow these instructions at home:  Avoid activities that make pain worse. This includes any activities that use chest, abdominal, and side muscles.  If directed, put ice on the painful area: ? Put ice in a plastic bag. ? Place a towel between your skin and the bag. ? Leave the ice on for 20 minutes, 2-3 times a day.  If directed, apply heat to the affected area as often as told by your health care provider. Use the heat source that your health care provider recommends, such as a moist heat pack or a heating pad. ? Place a towel between your skin and the heat source. ? Leave the heat on for 20-30 minutes. ? Remove the heat if your skin turns bright red. This is especially important if you are unable to feel pain, heat, or cold. You may have a greater risk of getting burned.  Take over-the-counter and prescription medicines only as told by your health care provider.  Return to your normal activities as told by your health care provider. Ask your health care provider what activities are safe for you.  Keep all follow-up visits as told by your health care provider. This is important. Contact a health care provider if:  You have chills or a fever.  Your pain does not go away or it gets   worse.  You have a cough that does not go away (is persistent). Get help right away if:  You have shortness of breath. This information is not intended to replace advice given to you by your health care provider. Make sure you discuss any questions you have with your health care provider. Document Released: 11/18/2004 Document Revised: 02/23/2017 Document Reviewed: 06/04/2015 Elsevier Patient Education  2020 [employer].

## 2019-01-24 ENCOUNTER — Ambulatory Visit: Payer: Medicare Other | Admitting: Family Medicine

## 2019-01-24 DIAGNOSIS — M940 Chondrocostal junction syndrome [Tietze]: Secondary | ICD-10-CM | POA: Insufficient documentation

## 2019-01-24 DIAGNOSIS — R053 Chronic cough: Secondary | ICD-10-CM

## 2019-01-24 DIAGNOSIS — Z87891 Personal history of nicotine dependence: Secondary | ICD-10-CM | POA: Insufficient documentation

## 2019-01-24 DIAGNOSIS — R05 Cough: Secondary | ICD-10-CM | POA: Insufficient documentation

## 2019-01-24 HISTORY — DX: Chronic cough: R05.3

## 2019-01-24 HISTORY — DX: Chondrocostal junction syndrome (tietze): M94.0

## 2019-01-24 NOTE — Assessment & Plan Note (Signed)
cxr and pred taper  If not improvement CT chest

## 2019-01-24 NOTE — Assessment & Plan Note (Signed)
Muscle relaxers,  Tylenol or IB Heat  pred to try to stop cough

## 2019-03-23 ENCOUNTER — Ambulatory Visit: Payer: Medicare Other

## 2019-03-31 ENCOUNTER — Ambulatory Visit: Payer: Medicare PPO | Attending: Internal Medicine

## 2019-03-31 DIAGNOSIS — Z23 Encounter for immunization: Secondary | ICD-10-CM

## 2019-03-31 NOTE — Progress Notes (Signed)
   Covid-19 Vaccination Clinic  Name:  [name]    MRN: [id number] DOB: 1955-10-08  03/31/2019  [name] was observed post Covid-19 immunization for 15 minutes without incidence. She was provided with Vaccine Information Sheet and instruction to access the V-Safe system.   Ms. Cousin was instructed to call 911 with any severe reactions post vaccine: Marland Kitchen Difficulty breathing  . Swelling of your face and throat  . A fast heartbeat  . A bad rash all over your body  . Dizziness and weakness    Immunizations Administered    Name Date Dose VIS Date Route   Pfizer COVID-19 Vaccine 03/31/2019  9:18 AM 0.3 mL 02/02/2019 Intramuscular   Manufacturer: [place]   Lot: [id number]   [place]: [id number]

## 2019-03-31 NOTE — Progress Notes (Signed)
   Covid-19 Vaccination Clinic  Name:  [name]    MRN: [id number] DOB: 05-06-1955  03/31/2019  [name] was observed post Covid-19 immunization for 15 minutes without incidence. She was provided with Vaccine Information Sheet and instruction to access the V-Safe system.   [name] was instructed to call 911 with any severe reactions post vaccine: Marland Kitchen Difficulty breathing  . Swelling of your face and throat  . A fast heartbeat  . A bad rash all over your body  . Dizziness and weakness    Immunizations Administered    Name Date Dose VIS Date Route   Pfizer COVID-19 Vaccine 03/31/2019  9:18 AM 0.3 mL 02/02/2019 Intramuscular   Manufacturer: [place]   Lot: [id number]   [place]: [id number]

## 2019-04-11 ENCOUNTER — Ambulatory Visit: Payer: Medicare Other

## 2019-04-12 ENCOUNTER — Ambulatory Visit: Payer: Medicare PPO | Admitting: Family Medicine

## 2019-04-13 ENCOUNTER — Encounter: Payer: Self-pay | Admitting: Family Medicine

## 2019-04-13 ENCOUNTER — Other Ambulatory Visit: Payer: Self-pay

## 2019-04-13 ENCOUNTER — Ambulatory Visit: Payer: Medicare PPO | Admitting: Family Medicine

## 2019-04-13 DIAGNOSIS — R058 Other specified cough: Secondary | ICD-10-CM

## 2019-04-13 DIAGNOSIS — R05 Cough: Secondary | ICD-10-CM | POA: Diagnosis not present

## 2019-04-13 MED ORDER — PREDNISONE 10 MG PO TABS: ORAL_TABLET | ORAL | 0 refills | Status: DC

## 2019-04-13 MED ORDER — PROMETHAZINE-DM 6.25-15 MG/5ML PO SYRP: 5.0000 mL | ORAL_SOLUTION | Freq: Four times a day (QID) | ORAL | 0 refills | Status: DC | PRN

## 2019-04-13 NOTE — Progress Notes (Signed)
Virtual Visit via Video Note  I connected with [name] on 04/13/19 at  2:40 PM EST by a video enabled telemedicine application and verified that I am speaking with the correct person using two identifiers.  Location: Patient: in car  Provider: office    I discussed the limitations of evaluation and management by telemedicine and the availability of in person appointments. The patient expressed understanding and agreed to proceed.  History of Present Illness: Pt is in her car and states last Wednesday she went to the [place] and she had congestion and she had a coughing fit and she could not stop    And she is still having pain under r breast and spasms in back.     Observations/Objective: There were no vitals filed for this visit.  No fevers  Pt is in nad   Assessment and Plan: 1. Allergic cough pred taper  Cough med per orders con't inhalers F/u allergy  Consider pulm referral - predniSONE (DELTASONE) 10 MG tablet; TAKE 3 TABLETS PO QD FOR 3 DAYS THEN TAKE 2 TABLETS PO QD FOR 3 DAYS THEN TAKE 1 TABLET PO QD FOR 3 DAYS THEN TAKE 1/2 TAB PO QD FOR 3 DAYS  Dispense: 20 tablet; Refill: 0 - promethazine-dextromethorphan (PROMETHAZINE-DM) 6.25-15 MG/5ML syrup; Take 5 mLs by mouth 4 (four) times daily as needed.  Dispense: 118 mL; Refill: 0   Follow Up Instructions:    I discussed the assessment and treatment plan with the patient. The patient was provided an opportunity to ask questions and all were answered. The patient agreed with the plan and demonstrated an understanding of the instructions.   The patient was advised to call back or seek an in-person evaluation if the symptoms worsen or if the condition fails to improve as anticipated.  I provided 25 minutes of non-face-to-face time during this encounter.   Ann Held, DO

## 2019-04-25 ENCOUNTER — Ambulatory Visit: Payer: Medicare PPO | Attending: Internal Medicine

## 2019-04-25 DIAGNOSIS — Z23 Encounter for immunization: Secondary | ICD-10-CM | POA: Insufficient documentation

## 2019-04-25 NOTE — Progress Notes (Signed)
   Covid-19 Vaccination Clinic  Name:  ADONIAH SEYOUM    MRN: [id number] DOB: 1955-08-18  04/25/2019  Ms. Castiglioni was observed post Covid-19 immunization for 15 minutes without incident. She was provided with Vaccine Information Sheet and instruction to access the V-Safe system.   Ms. Elsayed was instructed to call 911 with any severe reactions post vaccine: Marland Kitchen Difficulty breathing  . Swelling of face and throat  . A fast heartbeat  . A bad rash all over body  . Dizziness and weakness   Immunizations Administered    Name Date Dose VIS Date Route   Pfizer COVID-19 Vaccine 04/25/2019  8:37 AM 0.3 mL 02/02/2019 Intramuscular   Manufacturer: [place]   Lot: [id number]   [place]: [id number]

## 2019-04-30 DIAGNOSIS — J300 Vasomotor rhinitis: Secondary | ICD-10-CM | POA: Diagnosis not present

## 2019-04-30 DIAGNOSIS — R05 Cough: Secondary | ICD-10-CM | POA: Diagnosis not present

## 2019-06-05 ENCOUNTER — Encounter: Payer: Self-pay | Admitting: Family Medicine

## 2019-06-05 ENCOUNTER — Other Ambulatory Visit: Payer: Self-pay | Admitting: Family Medicine

## 2019-06-05 DIAGNOSIS — E2839 Other primary ovarian failure: Secondary | ICD-10-CM

## 2019-06-05 NOTE — Telephone Encounter (Signed)
Please advise 

## 2019-06-05 NOTE — Telephone Encounter (Signed)
Referral placed.

## 2019-06-25 ENCOUNTER — Other Ambulatory Visit: Payer: Self-pay

## 2019-06-25 ENCOUNTER — Ambulatory Visit: Payer: Medicare PPO | Admitting: Internal Medicine

## 2019-06-25 ENCOUNTER — Encounter: Payer: Self-pay | Admitting: Internal Medicine

## 2019-06-25 VITALS — BP 120/80 | HR 72 | Temp 97.3°F | Ht 63.0 in | Wt 119.6 lb

## 2019-06-25 DIAGNOSIS — Z87891 Personal history of nicotine dependence: Secondary | ICD-10-CM | POA: Diagnosis not present

## 2019-06-25 DIAGNOSIS — Z8709 Personal history of other diseases of the respiratory system: Secondary | ICD-10-CM | POA: Diagnosis not present

## 2019-06-25 DIAGNOSIS — R05 Cough: Secondary | ICD-10-CM

## 2019-06-25 DIAGNOSIS — R053 Chronic cough: Secondary | ICD-10-CM

## 2019-06-25 DIAGNOSIS — R059 Cough, unspecified: Secondary | ICD-10-CM

## 2019-06-25 NOTE — Progress Notes (Signed)
OV 06/25/2019  Subjective:  Patient ID: [name], female , DOB: 1955-12-05 , age 64 y.o. , MRN: [id number] , ADDRESS: [address] [place] [facility] [address]   06/25/2019 -   Chief Complaint  Patient presents with  . Consult    Pt is here due to having a persistent dry cough for years. Pt also is being treated for a pneumothorax. States her cough could be bad at any time of the day.     HPI [name] 64 y.o. -has been referred by Dr. Donneta Romberg allergist for evaluation of chronic cough.  Patient tells me that she has had insidious onset of chronic cough since the 1990s.  Since then it is persistent and is at the same level.  Cough is described as moderate.  Mostly dry except when she has a flareup with an infection or bronchitis.  In 2005 she had a spontaneous right pneumothorax after respiratory flareup.  This was treated initially by tube thoracostomy and then by pleurodesis by Dr. Caffie Pinto according to the patient's history.  Since then she has not had a recurrence of pneumothorax but does continue to have chronic cough.  The cough is definitely made worse by talking a lot or by inhaling fumes such as perfume or any smoke exposure.  She says the cough during this episodes can be so severe that she almost has a whooping sound to it.  This not necessarily any associated shortness of breath.  The cough is mostly dry.  The cough does not wake her up at night.  Severity index is listed below.  Talking does make the cough worse.  Cough relevant history  -Hypertension: She is not on ACE inhibitor but is on ARB valsartan.  This is not associated with cough  -Allergy history: She did have onset of spring allergies in 1990s.  During this time allergy testing showed positive allergies to a few things including mold and grass.  She says repeat allergy testing some years ago by Dr. Donneta Romberg revealed no allergies.  She is not on allergy shots but she is on antihistamines and nasal steroids.  -Sinus  history: She is not had a CT scan of the sinus.  She does not have a significant postnasal drip but she is also on antihistamines.  She has never been evaluated by ENT.  Acid reflux history: She denies any strong acid reflux.  She states in 20 years she is probably taken a few bottles of Tums in total.  She is never seen GI.  However a GI referral is under consideration by Dr. Donneta Romberg  -Chronic refractory cough: She does clear her throat.  - Verbal output hx: she is considered a chatty person by her family. Job was a Pharmacologist at [employer].  During this time she had to talk a lot.  -Imaging: Chest x-ray 2020 personally visualized and is clear.  Dr Lorenza Cambridge Reflux Symptom Index (> 13-15 suggestive of LPR cough) 0 -> 5  =  none ->severe problem 06/25/2019   Hoarseness of problem with voice 3  Clearing  Of Throat 1  Excess throat mucus or feeling of post nasal drip 1  Difficulty swallowing food, liquid or tablets 0  Cough after eating or lying down 2  Breathing difficulties or choking episodes 3  Troublesome or annoying cough 5  Sensation of something sticking in throat or lump in throat 2  Heartburn, chest pain, indigestion, or stomach acid coming up 1  TOTAL 17      CXR 01/23/2019  IMPRESSION: Stable postoperative chest with mild pleural thickening bilaterally. No evidence of recurrent pneumothorax or acute cardiopulmonary process.   Electronically Signed   By: Richardean Sale M.D.   On: 01/23/2019 14:44   ROS - per HPI  Results for WHITTLEY, JURKIEWICZ (MRN [id number]) as of 06/25/2019 09:38  Ref. Range 08/20/2013 09:35 02/21/2014 10:40 08/23/2014 10:30 09/23/2015 16:35 07/29/2016 16:46 10/18/2016 10:06 10/21/2017 10:05 11/03/2018 13:56  Eosinophils Absolute Latest Ref Range: 0.0 - 0.7 K/uL 0.2 0.1 0.1 0.1 0.1 0.1 0.1 0.1   Results for CIRE, GALAN (MRN [id number]) as of 06/25/2019 09:38  Ref. Range 03/22/2017 13:54  Influenza A, POC Latest Ref Range: Negative  Positive  (A)  Influenza B, POC Latest Ref Range: Negative  Negative    has a past medical history of Allergy, Cataract, H/O Bell's palsy (05/2010), Hypertension, and Spontaneous pneumothorax (2005).   reports that she quit smoking about 36 years ago. Her smoking use included cigarettes. She has a 14.00 pack-year smoking history. She has never used smokeless tobacco.  Past Surgical History:  Procedure Laterality Date  . CESAREAN SECTION    . CHEST TUBE INSERTION  2005   spontaneus Pneumothorax  . COLONOSCOPY    . SPIROMETRY  01/20/2015   Dr. Donneta Romberg  . VIDEO ASSISTED THORACOSCOPY  2005  . WISDOM TOOTH EXTRACTION      No Known Allergies  Immunization History  Administered Date(s) Administered  . Fluad Quad(high Dose 65+) 11/03/2018  . Influenza Whole 11/30/2006, 12/13/2007, 01/01/2009, 12/31/2009  . Influenza,inj,Quad PF,6+ Mos 11/27/2017  . Influenza-Unspecified 10/24/2013, 11/28/2016  . PFIZER SARS-COV-2 Vaccination 03/31/2019, 04/25/2019  . Pneumococcal Conjugate-13 11/03/2018  . Td 08/28/2002, 04/18/2008  . Zoster 08/20/2013  . Zoster Recombinat (Shingrix) 11/28/2016, 02/04/2017    Family History  Problem Relation Age of Onset  . Hypertension Mother   . Stroke Mother   . Heart disease Father   . Hypertension Father   . Cancer Maternal Grandmother 72       breast  . Diabetes Maternal Grandmother   . Colon cancer Neg Hx   . Esophageal cancer Neg Hx   . Rectal cancer Neg Hx   . Stomach cancer Neg Hx      Current Outpatient Medications:  .  ADVAIR HFA 115-21 MCG/ACT inhaler, INL 2 PFS PO BID, Disp: , Rfl:  .  albuterol (PROVENTIL) (2.5 MG/3ML) 0.083% nebulizer solution, Take 2.5 mg by nebulization every 6 (six) hours as needed for wheezing or shortness of breath., Disp: , Rfl:  .  azelastine (ASTELIN) 0.1 % nasal spray, Place 1 spray into both nostrils 2 (two) times daily., Disp: , Rfl: 3 .  Calcium Carbonate-Vitamin D (CALCIUM-VITAMIN D3 PO), Take by mouth., Disp: , Rfl:   .  fluticasone (FLONASE) 50 MCG/ACT nasal spray, Place 1 spray into both nostrils daily., Disp: , Rfl:  .  levocetirizine (XYZAL) 5 MG tablet, TK 1 T PO QAM, Disp: , Rfl:  .  montelukast (SINGULAIR) 10 MG tablet, Take 10 mg by mouth at bedtime., Disp: , Rfl:  .  Omega-3 Fatty Acids (OMEGA-3 FISH OIL PO), Take by mouth., Disp: , Rfl:  .  spironolactone (ALDACTONE) 25 MG tablet, Take 1 tablet (25 mg total) by mouth daily., Disp: 90 tablet, Rfl: 3 .  valsartan (DIOVAN) 160 MG tablet, Take 1 tablet (160 mg total) by mouth daily., Disp: 90 tablet, Rfl: 3 .  promethazine-dextromethorphan (PROMETHAZINE-DM) 6.25-15 MG/5ML syrup, Take 5 mLs  by mouth 4 (four) times daily as needed. (Patient not taking: Reported on 06/25/2019), Disp: 118 mL, Rfl: 0 .  sodium chloride (OCEAN) 0.65 % SOLN nasal spray, Place 1 spray into both nostrils as needed for congestion., Disp: , Rfl:   Current Facility-Administered Medications:  .  0.9 %  sodium chloride infusion, 500 mL, Intravenous, Once, Nandigam, Kavitha V, MD .  gi cocktail (Maalox,Lidocaine,Donnatal), 30 mL, Oral, Once, [employer], Yvonne R, DO      Objective:   Vitals:   06/25/19 0937  BP: 120/80  Pulse: 72  Temp: (!) 97.3 F (36.3 C)  TempSrc: Temporal  SpO2: 100%  Weight: 119 lb 9.6 oz (54.3 kg)  Height: 5\' 3"  (1.6 m)    Estimated body mass index is 21.19 kg/m as calculated from the following:   Height as of this encounter: 5\' 3"  (1.6 m).   Weight as of this encounter: 119 lb 9.6 oz (54.3 kg).  @WEIGHTCHANGE @  [employer]   06/25/19 0937  Weight: 119 lb 9.6 oz (54.3 kg)     Physical Exam  General Appearance:    Alert, cooperative, no distress, appears stated age - yes , Deconditioned looking - no , OBESE  - no, Sitting on Wheelchair -  no  Head:    Normocephalic, without obvious abnormality, atraumatic  Eyes:    PERRL, conjunctiva/corneas clear,  Ears:    Normal TM's and external ear canals, both ears  Nose:   Nares normal, septum  midline, mucosa normal, no drainage    or sinus tenderness. OXYGEN ON  - no . Patient is @ no   Throat:   Lips, mucosa, and tongue normal; teeth and gums normal. Cyanosis on lips - no.  Neck:   Supple, symmetrical, trachea midline, no adenopathy;    thyroid:  no enlargement/tenderness/nodules; no carotid   bruit or JVD  Back:     Symmetric, no curvature, ROM normal, no CVA tenderness  Lungs:     Distress - non , Wheeze onon, Barrell Chest - no, Purse lip breathing - no, Crackles - no   Chest Wall:    No tenderness or deformity.    Heart:    Regular rate and rhythm, S1 and S2 normal, no rub   or gallop, Murmur - no  Breast Exam:    NOT DONE  Abdomen:     Soft, non-tender, bowel sounds active all four quadrants,    no masses, no organomegaly. Visceral obesity - no  Genitalia:   NOT DONE  Rectal:   NOT DONE  Extremities:   Extremities - normal, Has Cane - no, Clubbing - no, Edema - no  Pulses:   2+ and symmetric all extremities  Skin:   Stigmata of Connective Tissue Disease - no  Lymph nodes:   Cervical, supraclavicular, and axillary nodes normal  Psychiatric:  Neurologic:   Pleasant - yes, Anxious - no, Flat affect - no  CAm-ICU - neg, Alert and Oriented x 3 - yes, Moves all 4s - yes, Speech - normal, Cognition - intact           Assessment:       ICD-10-CM   1. Chronic cough  R05 Pulmonary function test    Ambulatory referral to Gastroenterology    Ambulatory referral to ENT  2. History of pneumothorax  Z87.09   3. History of cigarette smoking  Z87.891   4. Cough  R05 CT Chest High Resolution    CT Maxillofacial LTD WO CM  Plan:     Patient Instructions     ICD-10-CM   1. Chronic cough  R05   2. History of pneumothorax  Z87.09   3. 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 Brand Males, M.D., F.C.C.P,  Pulmonary and Critical Care Medicine Staff Physician, [place] Director - Interstitial Lung Disease  Program  Pulmonary [place] at [place], [place], [address]  Pager: [phone], If no answer or between  15:00h - 7:00h: call [phone] Telephone: [phone]  10:08 AM 06/25/2019

## 2019-06-25 NOTE — Patient Instructions (Addendum)
ICD-10-CM   1. Chronic cough  R05   2. History of pneumothorax  Z87.09   3. 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

## 2019-06-29 ENCOUNTER — Ambulatory Visit (INDEPENDENT_AMBULATORY_CARE_PROVIDER_SITE_OTHER)
Admission: RE | Admit: 2019-06-29 | Discharge: 2019-06-29 | Disposition: A | Discharge status: Home / Self Care | Payer: Medicare PPO | Source: Ambulatory Visit | Attending: Internal Medicine | Admitting: Internal Medicine

## 2019-06-29 ENCOUNTER — Other Ambulatory Visit: Payer: Self-pay

## 2019-06-29 DIAGNOSIS — R918 Other nonspecific abnormal finding of lung field: Secondary | ICD-10-CM | POA: Diagnosis not present

## 2019-06-29 DIAGNOSIS — R05 Cough: Secondary | ICD-10-CM | POA: Diagnosis not present

## 2019-06-29 DIAGNOSIS — J439 Emphysema, unspecified: Secondary | ICD-10-CM | POA: Diagnosis not present

## 2019-06-29 DIAGNOSIS — R059 Cough, unspecified: Secondary | ICD-10-CM

## 2019-07-02 ENCOUNTER — Telehealth: Payer: Self-pay | Admitting: Internal Medicine

## 2019-07-02 NOTE — Telephone Encounter (Signed)
Called patient to  Give results. Had to leave message. CT shows bronchiectasis mild c/w MAI and coronary aftery calcification and some mild emphysema  CT Chest High Resolution  Result Date: 06/29/2019 CLINICAL DATA:  Chronic nonproductive cough for years. EXAM: CT CHEST WITHOUT CONTRAST TECHNIQUE: Multidetector CT imaging of the chest was performed following the standard protocol without intravenous contrast. High resolution imaging of the lungs, as well as inspiratory and expiratory imaging, was performed. COMPARISON:  01/23/2019 chest radiograph. FINDINGS: Cardiovascular: Normal heart size. No significant pericardial effusion/thickening. Left anterior descending and right coronary atherosclerosis. Atherosclerotic nonaneurysmal thoracic aorta. Normal caliber pulmonary arteries. Mediastinum/Nodes: No discrete thyroid nodules. Unremarkable esophagus. No pathologically enlarged axillary, mediastinal or hilar lymph nodes, noting limited sensitivity for the detection of hilar adenopathy on this noncontrast study. Lungs/Pleura: No pneumothorax. No pleural effusion. Wedge resection suture line noted at the right lung apex. Mild paraseptal emphysema. No acute consolidative airspace disease or lung masses. Scattered mild cylindrical bronchiectasis in the mid lungs bilaterally, most prominent in the right middle lobe and inferior segment lingula, with associated mild patchy tree-in-bud opacities and mild irregular bandlike regions of subpleural consolidation compatible with postinfectious scarring. A few scattered solid pulmonary nodules in both lungs, largest with average diameter 6 mm in the right lower lobe posteriorly (series 3/image 80). No honeycombing. No significant lobular air trapping or evidence of tracheobronchomalacia on the expiration sequence. Upper abdomen: Hypodense posterior right liver 4.6 cm mass (series 2/image 135), characterized as a benign hemangioma on 2014 MRI abdomen study. Musculoskeletal: No  aggressive appearing focal osseous lesions. Mild thoracic spondylosis. IMPRESSION: 1. Scattered mild cylindrical bronchiectasis in the mid lungs with associated mild patchy tree-in-bud opacities and mild regions of subpleural scarring. 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 2. Few scattered solid pulmonary nodules in both lungs, largest with average diameter 6 mm in the right lower lobe. Non-contrast chest CT at 3-6 months is recommended. If the nodules are stable at time of repeat CT, then future CT at 18-24 months (from today's scan) is considered optional for low-risk patients, but is recommended for high-risk patients. This recommendation follows the consensus statement: Guidelines for Management of Incidental Pulmonary Nodules Detected on CT Images: From the Fleischner Society 2017; Radiology 2017; 284:228-243. 3. Two-vessel coronary atherosclerosis. 4. Aortic Atherosclerosis (ICD10-I70.0) and Emphysema (ICD10-J43.9). Electronically Signed   By: Ilona Sorrel M.D.   On: 06/29/2019 17:25   CT Maxillofacial LTD WO CM  Result Date: 06/30/2019 CLINICAL DATA:  Persistent dry cough for years EXAM: CT PARANASAL SINUS LIMITED WITHOUT CONTRAST TECHNIQUE: Non-contiguous multidetector CT images of the paranasal sinuses were obtained in a single plane without contrast. COMPARISON:  None. FINDINGS: The left frontal sinus is hypoplastic or aplastic. The other paranasal sinuses are formed and the sphenoid sinuses are expansive. Mild leftward septal deviation without turbinate contact. No active sinusitis. No incidental finding seen in the orbits, facial soft tissues, or brain. IMPRESSION: Negative for sinusitis. Electronically Signed   By: Monte Fantasia M.D.   On: 06/30/2019 18:47     Plan  - 15 min tele visitwith app to go over results of bronchiectasis - needs bronch/BAL as below - APP can explain to patient and set itup but only  After APP gives reslts using following  instructions  = coronary artery calcification - likely needs cardiac referral   Thanks    Please schedule the following:  Diagnosis: chronic cough wih bronchiectasis Procedure: Video bronchocoopy, flexible bronchoscopy with BAL Envisia Classifer Transbronchial biopsy:  Anesthesia: moderate sedation Do you need Fluro? no Priority: week of June 14th when MR is at [facility] [place] - endoscopy unit Date: week of June 14th Alternate Date: x  Time: AMok/ PMok Location: [place] endoscopy unit Does patient have OSA? no DM? no Or Latex allergy? no Medication Restriction: none Anticoagulate/Antiplatelet: non Pre-op Labs Ordered:not needed Imaging request: not needed  (If, SuperDimension CT Chest, please have STAT courier sent to [facility] Pulmonary Office [address])  Allergy History: No Known Allergies   reports that she quit smoking about 37 years ago. Her smoking use included cigarettes. She has a 14.00 pack-year smoking history. She has never used smokeless tobacco.   Current Outpatient Medications:  .  ADVAIR HFA 115-21 MCG/ACT inhaler, INL 2 PFS PO BID, Disp: , Rfl:  .  albuterol (PROVENTIL) (2.5 MG/3ML) 0.083% nebulizer solution, Take 2.5 mg by nebulization every 6 (six) hours as needed for wheezing or shortness of breath., Disp: , Rfl:  .  azelastine (ASTELIN) 0.1 % nasal spray, Place 1 spray into both nostrils 2 (two) times daily., Disp: , Rfl: 3 .  Calcium Carbonate-Vitamin D (CALCIUM-VITAMIN D3 PO), Take by mouth., Disp: , Rfl:  .  fluticasone (FLONASE) 50 MCG/ACT nasal spray, Place 1 spray into both nostrils daily., Disp: , Rfl:  .  levocetirizine (XYZAL) 5 MG tablet, TK 1 T PO QAM, Disp: , Rfl:  .  montelukast (SINGULAIR) 10 MG tablet, Take 10 mg by mouth at bedtime., Disp: , Rfl:  .  Omega-3 Fatty Acids (OMEGA-3 FISH OIL PO), Take by mouth., Disp: , Rfl:  .  promethazine-dextromethorphan (PROMETHAZINE-DM) 6.25-15 MG/5ML syrup, Take 5 mLs by mouth 4 (four)  times daily as needed. (Patient not taking: Reported on 06/25/2019), Disp: 118 mL, Rfl: 0 .  sodium chloride (OCEAN) 0.65 % SOLN nasal spray, Place 1 spray into both nostrils as needed for congestion., Disp: , Rfl:  .  spironolactone (ALDACTONE) 25 MG tablet, Take 1 tablet (25 mg total) by mouth daily., Disp: 90 tablet, Rfl: 3 .  valsartan (DIOVAN) 160 MG tablet, Take 1 tablet (160 mg total) by mouth daily., Disp: 90 tablet, Rfl: 3  Current Facility-Administered Medications:  .  0.9 %  sodium chloride infusion, 500 mL, Intravenous, Once, Nandigam, Venia Minks, MD .  gi cocktail (Maalox,Lidocaine,Donnatal), 30 mL, Oral, Once, Carollee Herter, Alferd Apa, DO   Please coordinate Pre-op COVID Testing   Please let Dr Chase Caller know via reply phone message on Epic  Thank you    SIGNATURE    Dr. Brand Males, M.D., F.C.C.P,  Pulmonary and Critical Care Medicine Staff Physician, [place] Director - Interstitial Lung Disease  Program  Pulmonary [place] at [place], [place], [address]  Pager: [phone], If no answer or between  15:00h - 7:00h: call [phone] Telephone: [phone]  5:52 PM 07/02/2019

## 2019-07-03 NOTE — Telephone Encounter (Signed)
Called and spoke with pt about info from MR. Pt has been scheduled a visit with Beth tomorrow 5/12 at 3:30. Pt requested in-office visit instead of televisit. Nothing further needed.

## 2019-07-04 ENCOUNTER — Encounter: Payer: Self-pay | Admitting: Primary Care

## 2019-07-04 ENCOUNTER — Other Ambulatory Visit: Payer: Self-pay

## 2019-07-04 ENCOUNTER — Telehealth: Payer: Self-pay | Admitting: Primary Care

## 2019-07-04 ENCOUNTER — Ambulatory Visit: Payer: Medicare PPO | Admitting: Primary Care

## 2019-07-04 VITALS — BP 122/76 | HR 82 | Temp 97.3°F | Ht 63.0 in | Wt 119.6 lb

## 2019-07-04 DIAGNOSIS — I25119 Atherosclerotic heart disease of native coronary artery with unspecified angina pectoris: Secondary | ICD-10-CM | POA: Diagnosis not present

## 2019-07-04 DIAGNOSIS — R059 Cough, unspecified: Secondary | ICD-10-CM

## 2019-07-04 DIAGNOSIS — I7090 Unspecified atherosclerosis: Secondary | ICD-10-CM

## 2019-07-04 DIAGNOSIS — R05 Cough: Secondary | ICD-10-CM

## 2019-07-04 NOTE — Progress Notes (Signed)
_0  ID: [name], female    DOB: 11-11-1955, 64 y.o.   MRN: [id number]  Chief Complaint  Patient presents with  . Follow-up    pt states chronic cough. pt is here to go over ct and sinus test.pt uses rescue inhaler before exercise.pt states scents causes cough to start    Referring provider: Ann Held, *  HPI: 64 year old female, former smoker quit 1984 (14 pack year hx).  Significant for spontaneous tension pneumothorax, hypertension.  Patient of Dr. Chase Caller, seen for consult on 06/25/2019 for chronic cough.  Ordered for high-resolution CAT scan,  CT sinuses without contrast, PFTs  And referred to GI/ ENT.    Previous [facility] pulmonary encounters: HPI [name] 64 y.o. -has been referred by Dr. Donneta Romberg allergist for evaluation of chronic cough.  Patient tells me that she has had insidious onset of chronic cough since the 1990s.  Since then it is persistent and is at the same level.  Cough is described as moderate.  Mostly dry except when she has a flareup with an infection or bronchitis.  In 2005 she had a spontaneous right pneumothorax after respiratory flareup.  This was treated initially by tube thoracostomy and then by pleurodesis by Dr. Caffie Pinto according to the patient's history.  Since then she has not had a recurrence of pneumothorax but does continue to have chronic cough.  The cough is definitely made worse by talking a lot or by inhaling fumes such as perfume or any smoke exposure.  She says the cough during this episodes can be so severe that she almost has a whooping sound to it.  This not necessarily any associated shortness of breath.  The cough is mostly dry.  The cough does not wake her up at night.  Severity index is listed below.  Talking does make the cough worse.  Cough relevant history  -Hypertension: She is not on ACE inhibitor but is on ARB valsartan.  This is not associated with cough  -Allergy history: She did have onset of spring allergies in  1990s.  During this time allergy testing showed positive allergies to a few things including mold and grass.  She says repeat allergy testing some years ago by Dr. Donneta Romberg revealed no allergies.  She is not on allergy shots but she is on antihistamines and nasal steroids.  -Sinus history: She is not had a CT scan of the sinus.  She does not have a significant postnasal drip but she is also on antihistamines.  She has never been evaluated by ENT.  Acid reflux history: She denies any strong acid reflux.  She states in 20 years she is probably taken a few bottles of Tums in total.  She is never seen GI.  However a GI referral is under consideration by Dr. Donneta Romberg  -Chronic refractory cough: She does clear her throat.  - Verbal output hx: she is considered a chatty person by her family. Job was a Pharmacologist at [employer].  During this time she had to talk a lot.  -Imaging: Chest x-ray 2020 personally visualized and is clear.   07/04/2019 Patient presents today for 10-day follow-up to review CT imaging. She has had a chronic cough for a long times. She takes montelukast and Xyzal. Uses maintenance inahler Advair hfa 115-21 twice daily and prn albuterol hfa prior to exercise. She states that nothing helps her cough. Triggers include strong scents, tart wine and spicy food. She has post nasal drip  symptoms. She gets out of breath with speaking. She has no issues with 45 min exercise class. No significant shortness of breath. No obvious reflux symptoms. She has a known hypodensity liver stable 2014. She does have some epigastric pain. She has an upcoming apt with GI but may rescheduled until after bronchscopy.   Imaging:  CT showed scattered mild cylindrical bronchiectasis mid lungs with associated mild patchy tree-in-bud opacities and mild regions of subpleural scarring.  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  Few scattered solid pulmonary nodules in  both lungs largest measuring 6 mm right lower lobe. 3. Two-vessel coronary atherosclerosis. 4. Aortic Atherosclerosis (ICD10-I70.0) and Emphysema   06/30/19 CT maxillofacial - Negative for sinusitis   No Known Allergies  Immunization History  Administered Date(s) Administered  . DTaP 02/03/2019  . Fluad Quad(high Dose 65+) 11/03/2018  . Influenza Whole 11/30/2006, 12/13/2007, 01/01/2009, 12/31/2009  . Influenza,inj,Quad PF,6+ Mos 11/27/2017  . Influenza-Unspecified 10/24/2013, 11/28/2016  . PFIZER SARS-COV-2 Vaccination 03/31/2019, 04/25/2019  . Pneumococcal Conjugate-13 11/03/2018  . Td 08/28/2002, 04/18/2008  . Zoster 08/20/2013  . Zoster Recombinat (Shingrix) 11/28/2016, 02/04/2017    Past Medical History:  Diagnosis Date  . Allergy   . Cataract   . H/O Bell's palsy 05/2010  . Hypertension   . Spontaneous pneumothorax 2005   R, s/p tube, then VATS    Tobacco History: Social History   Tobacco Use  Smoking Status Former Smoker  . Packs/day: 1.00  . Years: 14.00  . Pack years: 14.00  . Types: Cigarettes  . Quit date: 07/11/1982  . Years since quitting: 37.0  Smokeless Tobacco Never Used   Counseling given: Not Answered   Outpatient Medications Prior to Visit  Medication Sig Dispense Refill  . ADVAIR HFA 115-21 MCG/ACT inhaler INL 2 PFS PO BID    . albuterol (PROVENTIL) (2.5 MG/3ML) 0.083% nebulizer solution Take 2.5 mg by nebulization every 6 (six) hours as needed for wheezing or shortness of breath.    Marland Kitchen azelastine (ASTELIN) 0.1 % nasal spray Place 1 spray into both nostrils 2 (two) times daily.  3  . Calcium Carbonate-Vitamin D (CALCIUM-VITAMIN D3 PO) Take by mouth.    . fluticasone (FLONASE) 50 MCG/ACT nasal spray Place 1 spray into both nostrils daily.    Marland Kitchen levocetirizine (XYZAL) 5 MG tablet TK 1 T PO QAM    . montelukast (SINGULAIR) 10 MG tablet Take 10 mg by mouth at bedtime.    . Omega-3 Fatty Acids (OMEGA-3 FISH OIL PO) Take by mouth.    . sodium  chloride (OCEAN) 0.65 % SOLN nasal spray Place 1 spray into both nostrils as needed for congestion.    Marland Kitchen spironolactone (ALDACTONE) 25 MG tablet Take 1 tablet (25 mg total) by mouth daily. 90 tablet 3  . valsartan (DIOVAN) 160 MG tablet Take 1 tablet (160 mg total) by mouth daily. 90 tablet 3  . promethazine-dextromethorphan (PROMETHAZINE-DM) 6.25-15 MG/5ML syrup Take 5 mLs by mouth 4 (four) times daily as needed. (Patient not taking: Reported on 06/25/2019) 118 mL 0   Facility-Administered Medications Prior to Visit  Medication Dose Route Frequency Provider Last Rate Last Admin  . 0.9 %  sodium chloride infusion  500 mL Intravenous Once Nandigam, Kavitha V, MD      . gi cocktail (Maalox,Lidocaine,Donnatal)  30 mL Oral Once Ann Held, DO        Review of Systems  Review of Systems  Respiratory: Positive for cough. Negative for shortness of breath.  Gastrointestinal: Positive for abdominal pain.   Physical Exam  BP 122/76 (BP Location: Left Arm, Cuff Size: Normal)   Pulse 82   Temp (!) 97.3 F (36.3 C) (Temporal)   Ht _0  (1.6 m)   Wt 119 lb 9.6 oz (54.3 kg)   SpO2 95%   BMI 21.19 kg/m  Physical Exam Constitutional:      Appearance: Normal appearance.  HENT:     Head: Normocephalic and atraumatic.  Cardiovascular:     Rate and Rhythm: Normal rate and regular rhythm.  Pulmonary:     Effort: Pulmonary effort is normal.     Breath sounds: Normal breath sounds. No wheezing or rales.  Abdominal:     General: Abdomen is flat.     Palpations: Abdomen is soft.     Tenderness: There is no abdominal tenderness.  Skin:    General: Skin is warm and dry.  Neurological:     General: No focal deficit present.     Mental Status: She is alert and oriented to person, place, and time. Mental status is at baseline.  Psychiatric:        Mood and Affect: Mood normal.        Behavior: Behavior normal.        Thought Content: Thought content normal.        Judgment: Judgment  normal.      Lab Results:  CBC    Component Value Date/Time   WBC 5.9 11/03/2018 1356   RBC 4.19 11/03/2018 1356   HGB 12.5 11/03/2018 1356   HCT 36.6 11/03/2018 1356   PLT 204.0 11/03/2018 1356   MCV 87.5 11/03/2018 1356   MCHC 34.3 11/03/2018 1356   RDW 12.7 11/03/2018 1356   LYMPHSABS 1.7 11/03/2018 1356   MONOABS 0.4 11/03/2018 1356   EOSABS 0.1 11/03/2018 1356   BASOSABS 0.0 11/03/2018 1356    BMET    Component Value Date/Time   NA 135 11/03/2018 1356   K 3.4 (L) 11/03/2018 1356   CL 98 11/03/2018 1356   CO2 29 11/03/2018 1356   GLUCOSE 93 11/03/2018 1356   BUN 10 11/03/2018 1356   CREATININE 0.58 11/03/2018 1356   CALCIUM 9.2 11/03/2018 1356   GFRNONAA 109.87 05/05/2009 0000   GFRAA 96 04/18/2008 0000    BNP No results found for: BNP  ProBNP No results found for: PROBNP  Imaging: CT Chest High Resolution  Result Date: 06/29/2019 CLINICAL DATA:  Chronic nonproductive cough for years. EXAM: CT CHEST WITHOUT CONTRAST TECHNIQUE: Multidetector CT imaging of the chest was performed following the standard protocol without intravenous contrast. High resolution imaging of the lungs, as well as inspiratory and expiratory imaging, was performed. COMPARISON:  01/23/2019 chest radiograph. FINDINGS: Cardiovascular: Normal heart size. No significant pericardial effusion/thickening. Left anterior descending and right coronary atherosclerosis. Atherosclerotic nonaneurysmal thoracic aorta. Normal caliber pulmonary arteries. Mediastinum/Nodes: No discrete thyroid nodules. Unremarkable esophagus. No pathologically enlarged axillary, mediastinal or hilar lymph nodes, noting limited sensitivity for the detection of hilar adenopathy on this noncontrast study. Lungs/Pleura: No pneumothorax. No pleural effusion. Wedge resection suture line noted at the right lung apex. Mild paraseptal emphysema. No acute consolidative airspace disease or lung masses. Scattered mild cylindrical  bronchiectasis in the mid lungs bilaterally, most prominent in the right middle lobe and inferior segment lingula, with associated mild patchy tree-in-bud opacities and mild irregular bandlike regions of subpleural consolidation compatible with postinfectious scarring. A few scattered solid pulmonary nodules in both lungs, largest with average diameter  6 mm in the right lower lobe posteriorly (series 3/image 80). No honeycombing. No significant lobular air trapping or evidence of tracheobronchomalacia on the expiration sequence. Upper abdomen: Hypodense posterior right liver 4.6 cm mass (series 2/image 135), characterized as a benign hemangioma on 2014 MRI abdomen study. Musculoskeletal: No aggressive appearing focal osseous lesions. Mild thoracic spondylosis. IMPRESSION: 1. Scattered mild cylindrical bronchiectasis in the mid lungs with associated mild patchy tree-in-bud opacities and mild regions of subpleural scarring. 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 2. Few scattered solid pulmonary nodules in both lungs, largest with average diameter 6 mm in the right lower lobe. Non-contrast chest CT at 3-6 months is recommended. If the nodules are stable at time of repeat CT, then future CT at 18-24 months (from today's scan) is considered optional for low-risk patients, but is recommended for high-risk patients. This recommendation follows the consensus statement: Guidelines for Management of Incidental Pulmonary Nodules Detected on CT Images: From the Fleischner Society 2017; Radiology 2017; 284:228-243. 3. Two-vessel coronary atherosclerosis. 4. Aortic Atherosclerosis (ICD10-I70.0) and Emphysema (ICD10-J43.9). Electronically Signed   By: Ilona Sorrel M.D.   On: 06/29/2019 17:25   CT Maxillofacial LTD WO CM  Result Date: 06/30/2019 CLINICAL DATA:  Persistent dry cough for years EXAM: CT PARANASAL SINUS LIMITED WITHOUT CONTRAST TECHNIQUE: Non-contiguous multidetector CT images of  the paranasal sinuses were obtained in a single plane without contrast. COMPARISON:  None. FINDINGS: The left frontal sinus is hypoplastic or aplastic. The other paranasal sinuses are formed and the sphenoid sinuses are expansive. Mild leftward septal deviation without turbinate contact. No active sinusitis. No incidental finding seen in the orbits, facial soft tissues, or brain. IMPRESSION: Negative for sinusitis. Electronically Signed   By: Monte Fantasia M.D.   On: 06/30/2019 18:47     Assessment & Plan:   Chronic cough - CT showed scattered mild cylindrical bronchiectasis mid lungs with tree-in-bud opacities and mild regions of subpleural scarring. 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 Few scattered solid pulmonary nodules in both lungs largest measuring 6 mm right lower lobe.  - Discussed CT findings with patient. Reviewed recommended to proceed with baroscopy. Reviewed risks and summary of procedure.She is no on a blood thinner. Agreeing to proceed with further diagnostic testing.  - Orders: Video bronchoscopy, flexible bronchoscopy with BAL re: chronic cough with bronchiectasis  - Recommend follow-up CT in 3-6 months     Atherosclerosis Referral: Cardiology RE: coronary artery disease  Seen on HRCT   > 30 mins spent face to face with patient  Martyn Ehrich, NP 07/08/2019

## 2019-07-04 NOTE — Telephone Encounter (Addendum)
  Needs to set up bronchoscopy with BAL   Please schedule the following:  Diagnosis: chronic cough wih bronchiectasis Procedure: Video bronchocoopy, flexible bronchoscopy with BAL Envisia Classifer Transbronchial biopsy:  Anesthesia: moderate sedation Do you need Fluro? no Priority: week of June 14th when MR is at [facility] - endoscopy unit Date: week of June 14th Alternate Date: x  Time: AMok/ PMok Location: [facility] endoscopy unit Does patient have OSA? no DM? no Or Latex allergy? no Medication Restriction: none Anticoagulate/Antiplatelet: non Pre-op Labs Ordered:not needed Imaging request: not needed  (If, SuperDimension CT Chest, please have STAT courier sent to [facility] Pulmonary Office [address])

## 2019-07-04 NOTE — Patient Instructions (Addendum)
CT showed scattered mild cylindrical bronchiectasis mid lungs with tree-in-bud opacities and mild regions of subpleural scarring. 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 Few scattered solid pulmonary nodules in both lungs largest measuring 6 mm right lower lobe. Recommend follow-up CT in 3-6 months   Orders: Needs to set up bronchoscopy with BAL (please send note to procedural pool to set up) CT chest without contrast in 6 months  Referral: Cardiology RE: coronary artery disease      Flexible Bronchoscopy  Flexible bronchoscopy is a procedure that is used to examine the passageways in the lungs. During the procedure, a thin, flexible tool with a camera on it (bronchoscope) is passed into the mouth or nose, down through the windpipe (trachea), and into the air tubes (bronchi) in the lungs. This tool allows your health care provider to look at your lungs from the inside and take testing (diagnostic) samples if needed. Tell a health care provider about:  Any allergies you have.  All medicines you are taking, including vitamins, herbs, eye drops, creams, and over-the-counter medicines.  Any problems you or family members have had with anesthetic medicines.  Any blood disorders you have.  Any surgeries you have had.  Any medical conditions you have.  Whether you are pregnant or may be pregnant. What are the risks? Generally, this is a safe procedure. However, problems may occur, including:  Infection.  Bleeding.  Damage to other structures or organs.  Allergic reactions to medicines.  Collapsed lung (pneumothorax).  Increased need for oxygen or difficulty breathing after the procedure. What happens before the procedure? Medicines Ask your health care provider about:  Changing or stopping your regular medicines. This is especially important if you are taking diabetes medicines or blood thinners.  Taking medicines such as aspirin and ibuprofen.  These medicines can thin your blood. Do not take these medicines before your procedure if your health care provider instructs you not to. You may be given antibiotic medicine to help prevent infection. Staying hydrated Follow instructions from your health care provider about hydration, which may include:  Up to 2 hours before the procedure - you may continue to drink clear liquids, such as water, clear fruit juice, black coffee, and plain tea. Eating and drinking Follow instructions from your health care provider about eating and drinking, which may include:  8 hours before the procedure - stop eating heavy meals or foods such as meat, fried foods, or fatty foods.  6 hours before the procedure - stop eating light meals or foods, such as toast or cereal.  6 hours before the procedure - stop drinking milk or drinks that contain milk.  2 hours before the procedure - stop drinking clear liquids. General instructions  Plan to have someone take you home from the hospital or clinic.  If you will be going home right after the procedure, plan to have someone with you for 24 hours. What happens during the procedure?  To lower your risk of infection: ? Your health care team will wash or sanitize their hands. ? Your skin will be washed with soap.  An IV tube will be inserted into one of your veins.  You will be given a medicine (local anesthetic) to numb your mouth, nose, throat, and voice box (larynx). You may also be given one or more of the following: ? A medicine to help you relax (sedative). ? A medicine to control coughing. ? A medicine to dry up any fluids in your lungs (secretions).  A bronchoscope will be passed into your nose or mouth, and into your lungs. Your health care provider will examine your lungs.  Samples of airway secretions may be collected for testing.  If abnormal areas are seen in your airways, tissue samples may be removed for examination under a microscope  (biopsy).  If tissue samples are needed from the outer parts of the lung, a type of X-ray (fluoroscopy) may be used to guide the bronchoscope to these areas.  If bleeding occurs, you may be given medicine to stop or decrease the bleeding. The procedure may vary among health care providers and hospitals. What happens after the procedure?  Do not drive for 24 hours if you were given a sedative.  Your blood pressure, heart rate, breathing rate, and blood oxygen level will be monitored until the medicines you were given have worn off.  You may have a chest X-ray to check for signs of pneumothorax.  You will not be allowed to eat or drink anything for 2 hours after your procedure.  If a biopsy was taken, it is up to you to get the results of your procedure. Ask your health care provider, or the department that is doing the procedure, when your results will be ready. Summary  Flexible bronchoscopy is a procedure that allows your health care provider to look closely at your lungs from the inside and take testing (diagnostic) samples if needed.  Risks of flexible bronchoscopy include bleeding, infection, and pneumothorax.  Before a flexible bronchoscopy, you will be given a medicine (local anesthetic) to numb your mouth, nose, throat, and voice box (larynx). Then, a bronchoscope will be passed into your nose or mouth, and into your lungs.  After the procedure, your blood pressure, heart rate, breathing rate, and blood oxygen level will be monitored until the medicines you were given have worn off. You may have a chest X-ray to check for signs of pneumothorax.  You will not be allowed to eat or drink anything for 2 hours after your procedure. This information is not intended to replace advice given to you by your health care provider. Make sure you discuss any questions you have with your health care provider. Document Revised: 01/21/2017 Document Reviewed: 03/13/2016 Elsevier Patient Education   [place].  Bronchiectasis  Bronchiectasis is a condition in which the airways in the lungs (bronchi) are damaged and widened. The condition makes it hard for the lungs to get rid of mucus, and it causes mucus to gather in the bronchi. This condition often leads to lung infections, which can make the condition worse. What are the causes? You can be born with this condition or you can develop it later in life. Common causes of this condition include:  Cystic fibrosis.  Repeated lung infections, such as pneumonia or tuberculosis.  An object or other blockage in the lungs.  Breathing in fluid, food, or other objects (aspiration).  A problem with the immune system and lung structure that is present at birth (congenital). Sometimes the cause is not known. What are the signs or symptoms? Common symptoms of this condition include:  A daily cough that brings up mucus and lasts for more than 3 weeks.  Lung infections that happen often.  Shortness of breath and wheezing.  Weakness and fatigue. How is this diagnosed? This condition is diagnosed with tests, such as:  Chest X-rays or CT scans. These are done to check for changes in the lungs.  Breathing tests. These are done to  check how well your lungs are working.  A test of a sample of your saliva (sputum culture). This test is done to check for infection.  Blood tests and other tests. These are done to check for related diseases or causes. How is this treated? Treatment for this condition depends on the severity of the illness and its cause. Treatment may include:  Medicines that loosen mucus so it can be coughed up (expectorants).  Medicines that relax the muscles of the bronchi (bronchodilators).  Antibiotic medicines to prevent or treat infection.  Physical therapy to help clear mucus from the lungs. Techniques may include: ? Postural drainage. This is when you sit or lie in certain positions so that mucus can  drain by gravity. ? Chest percussion. This involves tapping the chest or back with a cupped hand. ? Chest vibration. For this therapy, a hand or special equipment vibrates your chest and back.  Surgery to remove the affected part of the lung. This may be done in severe cases. Follow these instructions at home: Medicines  Take over-the-counter and prescription medicines only as told by your health care provider.  If you were prescribed an antibiotic medicine, take it as told by your health care provider. Do not stop taking the antibiotic even if you start to feel better.  Avoid taking sedatives and antihistamines unless your health care provider tells you to take them. These medicines tend to thicken the mucus in the lungs. Managing symptoms  Perform breathing exercises or techniques to clear your lungs as told by your health care provider.  Consider using a cold steam vaporizer or humidifier in your room or home to help loosen secretions.  If you have a cough that gets worse at night, try sleeping in a semi-upright position. General instructions  Get plenty of rest.  Drink enough fluid to keep your urine clear or pale yellow.  Stay inside when pollution and ozone levels are high.  Stay up to date with vaccinations and immunizations.  Avoid cigarette smoke and other lung irritants.  Do not use any products that contain nicotine or tobacco, such as cigarettes and e-cigarettes. If you need help quitting, ask your health care provider.  Keep all follow-up visits as told by your health care provider. This is important. Contact a health care provider if:  You cough up more sputum than before and the sputum is yellow or green in color.  You have a fever.  You cannot control your cough and are losing sleep. Get help right away if:  You cough up blood.  You have chest pain.  You have increasing shortness of breath.  You have pain that gets worse or is not controlled with  medicines.  You have a fever and your symptoms suddenly get worse. Summary  Bronchiectasis is a condition in which the airways in the lungs (bronchi) are damaged and widened. The condition makes it hard for the lungs to get rid of mucus, and it causes mucus to gather in the bronchi.  Treatment usually includes therapy to help clear mucus from the lungs.  Stay up to date with vaccinations and immunizations. This information is not intended to replace advice given to you by your health care provider. Make sure you discuss any questions you have with your health care provider. Document Revised: 01/21/2017 Document Reviewed: 03/15/2016 Elsevier Patient Education  [place].    Coronary Artery Disease, Female Coronary artery disease (CAD) is a condition in which the arteries that lead to  the heart (coronary arteries) become narrow or blocked. The narrowing or blockage can lead to decreased blood flow to the heart. Prolonged reduced blood flow can cause a heart attack (myocardial infarction or MI). This condition may also be called coronary heart disease. Because CAD is the leading cause of death in women, it is important to understand what causes this condition and how it is treated. What are the causes? CAD is most often caused by atherosclerosis. This is the buildup of fat and cholesterol (plaque) on the inside of the arteries. Over time, the plaque may narrow or block the artery, reducing blood flow to the heart. Plaque can also become weak and break off within a coronary artery and cause a sudden blockage. Other less common causes of CAD include:  A blood clot or a piece of a blood clot or other substance that blocks the flow of blood in a coronary artery (embolism).  A tearing of the artery (spontaneous coronary artery dissection).  An enlargement of an artery (aneurysm).  Inflammation (vasculitis) in the artery wall. What increases the risk? The following factors may make you  more likely to develop this condition:  Age. Women over age 17 are at a greater risk of CAD.  Family history of CAD.  High blood pressure (hypertension).  Diabetes.  High cholesterol levels.  Tobacco use.  Lack of exercise.  Menopause. ? All postmenopausal women are at greater risk of CAD. ? Women who have experienced menopause between the ages of 59-45 (early menopause) are at a higher risk of CAD. ? Women who have experienced menopause before age 67 (premature menopause) are at a very high risk of CAD.  Excessive alcohol use.  A diet high in saturated and trans fats, such as fried food and processed meat. Other possible risk factors include:  High stress levels.  Depression.  Obesity.  Sleep apnea. What are the signs or symptoms? Many people do not have any symptoms during the early stages of CAD. As the condition progresses, symptoms may include:  Chest pain (angina). The pain can: ? Feel like crushing or squeezing, or like a tightness, pressure, fullness, or heaviness in the chest. ? Last more than a few minutes or can stop and recur. The pain tends to get worse with exercise or stress and to fade with rest.  Pain in the arms, neck, jaw, ear, or back.  Unexplained heartburn or indigestion.  Shortness of breath.  Nausea.  Sudden cold sweats.  Sudden light-headedness.  Fluttering or fast heartbeat (palpitations). Many women have chest discomfort and the other symptoms. However, women often have unusual (atypical) symptoms, such as:  Fatigue.  Vomiting.  Unexplained feelings of nervousness or anxiety.  Unexplained weakness.  Dizziness or fainting. How is this diagnosed? This condition is diagnosed based on:  Your family and medical history.  A physical exam.  Tests, including: ? A test to check the electrical signals in your heart (electrocardiogram). ? Exercise stress test. This looks for signs of blockage when the heart is stressed with  exercise, such as running on a treadmill. ? Pharmacologic stress test. This test looks for signs of blockage when the heart is being stressed with a medicine. ? Blood tests. ? Coronary angiogram. This is a procedure to look at the coronary arteries to see if there is any blockage. During this test, a dye is injected into your arteries so they appear on an X-ray. ? Coronary artery CT scan. This CT scan helps detect calcium deposits in  your coronary arteries. Calcium deposits are an indicator of CAD. ? A test that uses sound waves to take a picture of your heart (echocardiogram). ? Chest X-ray. How is this treated? This condition may be treated by:  Healthy lifestyle changes to reduce risk factors.  Medicines such as: ? Antiplatelet medicines and blood-thinning medicines, such as aspirin. These help to prevent blood clots. ? Nitroglycerin. ? Blood pressure medicines. ? Cholesterol-lowering medicine.  Coronary angioplasty and stenting. During this procedure, a thin, flexible tube is inserted through a blood vessel and into a blocked artery. A balloon or similar device on the end of the tube is inflated to open up the artery. In some cases, a small, mesh tube (stent) is inserted into the artery to keep it open.  Coronary artery bypass surgery. During this surgery, veins or arteries from other parts of the body are used to create a bypass around the blockage and allow blood to reach your heart. Follow these instructions at home: Medicines  Take over-the-counter and prescription medicines only as told by your health care provider.  Do not take the following medicines unless your health care provider approves: ? NSAIDs, such as ibuprofen, naproxen, or celecoxib. ? Vitamin supplements that contain vitamin A, vitamin E, or both. ? Hormone replacement therapy that contains estrogen with or without progestin. Lifestyle  Follow an exercise program approved by your health care provider. Aim for  150 minutes of moderate exercise or 75 minutes of vigorous exercise each week.  Maintain a healthy weight or lose weight as approved by your health care provider.  Learn to manage stress or try to limit your stress. Ask your health care provider for suggestions if you need help.  Get screened for depression and seek treatment, if needed.  Do not use any products that contain nicotine or tobacco, such as cigarettes, e-cigarettes, and chewing tobacco. If you need help quitting, ask your health care provider.  Do not use illegal drugs. Eating and drinking   Follow a heart-healthy diet. A dietitian can help educate you about healthy food options and changes. In general, eat plenty of fruits and vegetables, lean meats, and whole grains.  Avoid foods high in: ? Sugar. ? Salt (sodium). ? Saturated fats, such as processed or fatty meat. ? Trans fats, such as fried food.  Use healthy cooking methods such as roasting, grilling, broiling, baking, poaching, steaming, or stir-frying.  Do not drink alcohol if: ? Your health care provider tells you not to drink. ? You are pregnant, may be pregnant, or are planning to become pregnant.  If you drink alcohol: ? Limit how much you have to 0-1 drink a day. ? Be aware of how much alcohol is in your drink. In the [place] one drink equals one 12 oz bottle of beer (355 mL), one 5 oz glass of wine (148 mL), or one 1 oz glass of hard liquor (44 mL). General instructions  Manage any other health conditions, such as hypertension and diabetes. These conditions affect your heart.  Your health care provider may ask you to monitor your blood pressure. Ideally, your blood pressure should be below 130/80.  Keep all follow-up visits as told by your health care provider. This is important. Get help right away if:  You have pain in your chest, neck, ear, arm, jaw, stomach, or back that: ? Lasts more than a few minutes. ? Is recurring. ? Is not relieved by  taking medicine under your tongue (sublingual nitroglycerin).  You have  profuse sweating without cause.  You have unexplained: ? Heartburn or indigestion. ? Shortness of breath or difficulty breathing. ? Fluttering or fast heartbeat (palpitations). ? Nausea or vomiting. ? Fatigue. ? Feelings of nervousness or anxiety. ? Weakness. ? Diarrhea.  You have sudden light-headedness or dizziness.  You faint.  You feel like hurting yourself or think about taking your own life. These symptoms may represent a serious problem that is an emergency. Do not wait to see if the symptoms will go away. Get medical help right away. Call your local emergency services (911 in the [place] Do not drive yourself to the hospital. Summary  Coronary artery disease (CAD) is a condition in which the arteries that lead to the heart (coronary arteries) become narrow or blocked. The narrowing or blockage can lead to a heart attack.  Many women have chest discomfort and other common symptoms of CAD. However, women often have unusual (atypical) symptoms, such as fatigue, vomiting, weakness, or dizziness.  CAD can be treated with lifestyle changes, medicines, surgery, or a combination of these treatments. This information is not intended to replace advice given to you by your health care provider. Make sure you discuss any questions you have with your health care provider. Document Revised: 10/28/2017 Document Reviewed: 10/18/2017 Elsevier Patient Education  2020 [employer].

## 2019-07-08 ENCOUNTER — Encounter: Payer: Self-pay | Admitting: Primary Care

## 2019-07-08 DIAGNOSIS — I7090 Unspecified atherosclerosis: Secondary | ICD-10-CM

## 2019-07-08 HISTORY — DX: Unspecified atherosclerosis: I70.90

## 2019-07-08 NOTE — Assessment & Plan Note (Addendum)
-   CT showed scattered mild cylindrical bronchiectasis mid lungs with tree-in-bud opacities and mild regions of subpleural scarring. 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 Few scattered solid pulmonary nodules in both lungs largest measuring 6 mm right lower lobe.  - Discussed CT findings with patient. Reviewed recommended to proceed with baroscopy. Reviewed risks and summary of procedure.She is no on a blood thinner. Agreeing to proceed with further diagnostic testing.  - Orders: Video bronchoscopy, flexible bronchoscopy with BAL re: chronic cough with bronchiectasis  - Recommend follow-up CT in 3-6 months

## 2019-07-08 NOTE — Assessment & Plan Note (Addendum)
Referral: Cardiology RE: coronary artery disease  Seen on HRCT

## 2019-07-09 NOTE — Telephone Encounter (Signed)
Called and spoke with patient and husband. Let them know their Bronch is scheduled for 08/06/19 at [facility] with Dr. Chase Caller at [place].  Patient was instructed to arrive at hospital at 0700. They were instructed to bring someone with them as they will not be able to drive home from procedure. Patient instructed not to have anything to eat or drink after midnight.    Patient's covid screening is scheduled at [facility] for 07/03/19 at 2:30p.  Patient voiced understanding, nothing further needed  Routing to MR as [facility]

## 2019-07-13 NOTE — Telephone Encounter (Signed)
Thank you but because my rotation on the week of August 06, 2019 is at [address] at [facility] my request is that this bronchoscopy be done at [facility].  I think it is scheduled at [facility].  I want this change to [facility]   thank you PS: pls rout it to procedure desk -unable to route it there for some reason

## 2019-07-16 NOTE — Telephone Encounter (Signed)
Dr. Chase Caller they said it had to be done at [facility] long when it was originally scheduled. I will call again tomorrow and speak with charge nurse to see if they will change it

## 2019-07-18 ENCOUNTER — Encounter: Payer: Self-pay | Admitting: Family Medicine

## 2019-07-18 DIAGNOSIS — L821 Other seborrheic keratosis: Secondary | ICD-10-CM | POA: Diagnosis not present

## 2019-07-18 DIAGNOSIS — B353 Tinea pedis: Secondary | ICD-10-CM | POA: Diagnosis not present

## 2019-07-18 DIAGNOSIS — D225 Melanocytic nevi of trunk: Secondary | ICD-10-CM | POA: Diagnosis not present

## 2019-07-18 DIAGNOSIS — Z1231 Encounter for screening mammogram for malignant neoplasm of breast: Secondary | ICD-10-CM | POA: Diagnosis not present

## 2019-07-18 DIAGNOSIS — D2371 Other benign neoplasm of skin of right lower limb, including hip: Secondary | ICD-10-CM | POA: Diagnosis not present

## 2019-07-18 DIAGNOSIS — L578 Other skin changes due to chronic exposure to nonionizing radiation: Secondary | ICD-10-CM | POA: Diagnosis not present

## 2019-07-18 DIAGNOSIS — M8588 Other specified disorders of bone density and structure, other site: Secondary | ICD-10-CM | POA: Diagnosis not present

## 2019-07-18 DIAGNOSIS — M810 Age-related osteoporosis without current pathological fracture: Secondary | ICD-10-CM | POA: Diagnosis not present

## 2019-07-18 LAB — HM DEXA SCAN

## 2019-07-18 LAB — HM MAMMOGRAPHY

## 2019-07-18 NOTE — Telephone Encounter (Signed)
Ok fine. [place] then 08/06/19 7.30am. Thanks. Noted

## 2019-07-19 NOTE — Telephone Encounter (Signed)
Called [facility] endo changed procedure to 0730.  ATC patient unable to reach left detailed message about time change on voicemail per [facility]

## 2019-07-24 DIAGNOSIS — J302 Other seasonal allergic rhinitis: Secondary | ICD-10-CM | POA: Diagnosis not present

## 2019-07-24 DIAGNOSIS — R05 Cough: Secondary | ICD-10-CM | POA: Diagnosis not present

## 2019-07-24 DIAGNOSIS — J310 Chronic rhinitis: Secondary | ICD-10-CM | POA: Diagnosis not present

## 2019-07-30 ENCOUNTER — Other Ambulatory Visit (HOSPITAL_COMMUNITY)
Admission: RE | Admit: 2019-07-30 | Discharge: 2019-07-30 | Disposition: A | Discharge status: Home / Self Care | Payer: Medicare PPO | Source: Ambulatory Visit | Attending: Internal Medicine | Admitting: Internal Medicine

## 2019-07-30 DIAGNOSIS — Z01812 Encounter for preprocedural laboratory examination: Secondary | ICD-10-CM | POA: Diagnosis not present

## 2019-07-30 DIAGNOSIS — Z20822 Contact with and (suspected) exposure to covid-19: Secondary | ICD-10-CM | POA: Insufficient documentation

## 2019-07-30 LAB — SARS CORONAVIRUS 2 (TAT 6-24 HRS): SARS Coronavirus 2: NEGATIVE

## 2019-08-02 ENCOUNTER — Other Ambulatory Visit: Payer: Self-pay

## 2019-08-02 ENCOUNTER — Ambulatory Visit (INDEPENDENT_AMBULATORY_CARE_PROVIDER_SITE_OTHER): Payer: Medicare PPO | Admitting: Internal Medicine

## 2019-08-02 DIAGNOSIS — R05 Cough: Secondary | ICD-10-CM

## 2019-08-02 DIAGNOSIS — R053 Chronic cough: Secondary | ICD-10-CM

## 2019-08-02 LAB — PULMONARY FUNCTION TEST
DL/VA % pred: 102 %
DL/VA: 4.31 ml/min/mmHg/L
DLCO cor % pred: 100 %
DLCO cor: 19.12 ml/min/mmHg
DLCO unc % pred: 100 %
DLCO unc: 19.12 ml/min/mmHg
FEF 25-75 Post: 2.33 L/sec
FEF 25-75 Pre: 2.02 L/sec
FEF2575-%Change-Post: 15 %
FEF2575-%Pred-Post: 114 %
FEF2575-%Pred-Pre: 99 %
FEV1-%Change-Post: 2 %
FEV1-%Pred-Post: 98 %
FEV1-%Pred-Pre: 95 %
FEV1-Post: 2.25 L
FEV1-Pre: 2.19 L
FEV1FVC-%Change-Post: 0 %
FEV1FVC-%Pred-Pre: 103 %
FEV6-%Change-Post: 4 %
FEV6-%Pred-Post: 99 %
FEV6-%Pred-Pre: 95 %
FEV6-Post: 2.87 L
FEV6-Pre: 2.76 L
FEV6FVC-%Change-Post: 0 %
FEV6FVC-%Pred-Post: 104 %
FEV6FVC-%Pred-Pre: 103 %
FVC-%Change-Post: 3 %
FVC-%Pred-Post: 95 %
FVC-%Pred-Pre: 91 %
FVC-Post: 2.87 L
FVC-Pre: 2.76 L
Post FEV1/FVC ratio: 79 %
Post FEV6/FVC ratio: 100 %
Pre FEV1/FVC ratio: 79 %
Pre FEV6/FVC Ratio: 100 %
RV % pred: 102 %
RV: 2.12 L
TLC % pred: 99 %
TLC: 4.87 L

## 2019-08-02 NOTE — Progress Notes (Signed)
PFT done today. 

## 2019-08-03 ENCOUNTER — Other Ambulatory Visit (HOSPITAL_COMMUNITY)
Admission: RE | Admit: 2019-08-03 | Discharge: 2019-08-03 | Disposition: A | Discharge status: Home / Self Care | Payer: Medicare PPO | Source: Ambulatory Visit | Attending: Internal Medicine | Admitting: Internal Medicine

## 2019-08-03 DIAGNOSIS — Z20822 Contact with and (suspected) exposure to covid-19: Secondary | ICD-10-CM | POA: Diagnosis not present

## 2019-08-03 DIAGNOSIS — Z01812 Encounter for preprocedural laboratory examination: Secondary | ICD-10-CM | POA: Insufficient documentation

## 2019-08-03 LAB — SARS CORONAVIRUS 2 (TAT 6-24 HRS): SARS Coronavirus 2: NEGATIVE

## 2019-08-04 ENCOUNTER — Encounter: Payer: Self-pay | Admitting: Family Medicine

## 2019-08-06 ENCOUNTER — Encounter (HOSPITAL_COMMUNITY): Payer: Self-pay | Admitting: Internal Medicine

## 2019-08-06 ENCOUNTER — Ambulatory Visit (HOSPITAL_COMMUNITY): Payer: Medicare PPO

## 2019-08-06 ENCOUNTER — Other Ambulatory Visit: Payer: Self-pay

## 2019-08-06 ENCOUNTER — Ambulatory Visit (HOSPITAL_COMMUNITY)
Admission: RE | Admit: 2019-08-06 | Discharge: 2019-08-06 | Disposition: A | Discharge status: Home / Self Care | Payer: Medicare PPO | Attending: Internal Medicine | Admitting: Internal Medicine

## 2019-08-06 ENCOUNTER — Encounter (HOSPITAL_COMMUNITY)
Admission: RE | Disposition: A | Discharge status: Home / Self Care | Payer: Self-pay | Source: Home / Self Care | Attending: Internal Medicine

## 2019-08-06 DIAGNOSIS — I10 Essential (primary) hypertension: Secondary | ICD-10-CM | POA: Insufficient documentation

## 2019-08-06 DIAGNOSIS — J479 Bronchiectasis, uncomplicated: Secondary | ICD-10-CM | POA: Insufficient documentation

## 2019-08-06 DIAGNOSIS — Z833 Family history of diabetes mellitus: Secondary | ICD-10-CM | POA: Insufficient documentation

## 2019-08-06 DIAGNOSIS — Z87891 Personal history of nicotine dependence: Secondary | ICD-10-CM | POA: Insufficient documentation

## 2019-08-06 DIAGNOSIS — Z803 Family history of malignant neoplasm of breast: Secondary | ICD-10-CM | POA: Insufficient documentation

## 2019-08-06 DIAGNOSIS — J984 Other disorders of lung: Secondary | ICD-10-CM | POA: Diagnosis not present

## 2019-08-06 DIAGNOSIS — Z8249 Family history of ischemic heart disease and other diseases of the circulatory system: Secondary | ICD-10-CM | POA: Diagnosis not present

## 2019-08-06 DIAGNOSIS — Z823 Family history of stroke: Secondary | ICD-10-CM | POA: Insufficient documentation

## 2019-08-06 DIAGNOSIS — R05 Cough: Secondary | ICD-10-CM | POA: Diagnosis not present

## 2019-08-06 DIAGNOSIS — G510 Bell's palsy: Secondary | ICD-10-CM | POA: Diagnosis not present

## 2019-08-06 DIAGNOSIS — J939 Pneumothorax, unspecified: Secondary | ICD-10-CM | POA: Diagnosis not present

## 2019-08-06 DIAGNOSIS — Z9889 Other specified postprocedural states: Secondary | ICD-10-CM

## 2019-08-06 HISTORY — PX: BRONCHIAL WASHINGS: SHX5105

## 2019-08-06 HISTORY — PX: VIDEO BRONCHOSCOPY: SHX5072

## 2019-08-06 LAB — BODY FLUID CELL COUNT WITH DIFFERENTIAL
Lymphs, Fluid: 5 %
Monocyte-Macrophage-Serous Fluid: 28 % — ABNORMAL LOW (ref 50–90)
Neutrophil Count, Fluid: 67 % — ABNORMAL HIGH (ref 0–25)
Total Nucleated Cell Count, Fluid: 325 cu mm (ref 0–1000)

## 2019-08-06 SURGERY — VIDEO BRONCHOSCOPY WITHOUT FLUORO
Anesthesia: Moderate Sedation

## 2019-08-06 MED ORDER — FENTANYL CITRATE (PF) 100 MCG/2ML IJ SOLN
INTRAMUSCULAR | Status: DC | PRN
  Administered 2019-08-06: 25 ug via INTRAVENOUS
  Administered 2019-08-06: 50 ug via INTRAVENOUS

## 2019-08-06 MED ORDER — BUTAMBEN-TETRACAINE-BENZOCAINE 2-2-14 % EX AERO: 1.0000 | INHALATION_SPRAY | Freq: Once | CUTANEOUS | Status: DC

## 2019-08-06 MED ORDER — LIDOCAINE HCL 1 % IJ SOLN
INTRAMUSCULAR | Status: DC | PRN
  Administered 2019-08-06: 2 mL
  Administered 2019-08-06: 5 mL
  Administered 2019-08-06: 2 mL

## 2019-08-06 MED ORDER — MIDAZOLAM HCL (PF) 10 MG/2ML IJ SOLN
INTRAMUSCULAR | Status: DC | PRN
  Administered 2019-08-06: 2 mg via INTRAVENOUS

## 2019-08-06 MED ORDER — LIDOCAINE HCL URETHRAL/MUCOSAL 2 % EX GEL: 1.0000 "application " | Freq: Once | CUTANEOUS | Status: DC

## 2019-08-06 MED ORDER — SODIUM CHLORIDE 0.9 % IV SOLN
INTRAVENOUS | Status: DC
  Administered 2019-08-06: 500 mL via INTRAVENOUS

## 2019-08-06 MED ORDER — LIDOCAINE HCL URETHRAL/MUCOSAL 2 % EX GEL
CUTANEOUS | Status: AC
  Filled 2019-08-06: qty 30

## 2019-08-06 MED ORDER — LIDOCAINE HCL 1 % IJ SOLN
INTRAMUSCULAR | Status: AC
  Filled 2019-08-06: qty 20

## 2019-08-06 MED ORDER — MIDAZOLAM HCL (PF) 5 MG/ML IJ SOLN
INTRAMUSCULAR | Status: AC
  Filled 2019-08-06: qty 2

## 2019-08-06 MED ORDER — LIDOCAINE VISCOUS HCL 2 % MT SOLN
OROMUCOSAL | Status: DC | PRN
  Administered 2019-08-06: 1

## 2019-08-06 MED ORDER — PHENYLEPHRINE HCL 0.25 % NA SOLN: 1.0000 | Freq: Four times a day (QID) | NASAL | Status: DC | PRN

## 2019-08-06 MED ORDER — PHENYLEPHRINE HCL 1 % NA SOLN
NASAL | Status: AC
  Filled 2019-08-06: qty 15

## 2019-08-06 MED ORDER — FENTANYL CITRATE (PF) 100 MCG/2ML IJ SOLN
INTRAMUSCULAR | Status: AC
  Filled 2019-08-06: qty 2

## 2019-08-06 MED ORDER — PHENYLEPHRINE HCL 0.5 % NA SOLN
NASAL | Status: DC | PRN
  Administered 2019-08-06: 1 [drp] via NASAL

## 2019-08-06 MED ORDER — BUTAMBEN-TETRACAINE-BENZOCAINE 2-2-14 % EX AERO
INHALATION_SPRAY | CUTANEOUS | Status: DC | PRN
  Administered 2019-08-06: 1 via TOPICAL

## 2019-08-06 NOTE — Discharge Instructions (Signed)
Please have someone to drive you home  Please be careful with activities for next 24 hours  You can eat 2-4 hours after getting home provided you are fully alert, able to cough, and are not nauseated or vomiting and     feel well  You are expected to have low grade fever or cough some amount of blood for next 24-48 hours; if this worsens call us  IF you are very short of breath or coughing blood or chest pain or not feeling well, call us [phone] anytime or go to emergency room  Future Appointments  Date Time Provider [place]  08/13/2019  2:00 PM Sueanne Margarita, MD [facility]  08/20/2019  9:15 AM Brand Males, MD [facility] None

## 2019-08-06 NOTE — H&P (Signed)
Pre bronch evaluation notes   [name] 11-20-1955 - Presents for bronchoscopy because of chronic cough.  Her CT scan of the chest showed Bronchiectasis suggestive of MAI infection with tree-in-bud pattern.  Most prominent in the right middle lobe greater than lingula.  History and physical is greater than 7 days old.  Therefore is being redone.  In the interim she has seen ENT.  She has seen nurse practitioner.  She endorses no new complaints.  Feels stable.  She has confirmed n.p.o. status.  She is taking her meds except Flonase and her inhalers.    has a past medical history of Allergy, Cataract, H/O Bell's palsy (05/2010), Hypertension, and Spontaneous pneumothorax (2005).   reports that she quit smoking about 37 years ago. Her smoking use included cigarettes. She has a 14.00 pack-year smoking history. She has never used smokeless tobacco.  Past Surgical History:  Procedure Laterality Date  . CESAREAN SECTION    . CHEST TUBE INSERTION  2005   spontaneus Pneumothorax  . COLONOSCOPY    . SPIROMETRY  01/20/2015   Dr. Donneta Romberg  . VIDEO ASSISTED THORACOSCOPY  2005  . WISDOM TOOTH EXTRACTION      No Known Allergies  Immunization History  Administered Date(s) Administered  . DTaP 02/03/2019  . Fluad Quad(high Dose 65+) 11/03/2018  . Influenza Whole 11/30/2006, 12/13/2007, 01/01/2009, 12/31/2009  . Influenza,inj,Quad PF,6+ Mos 11/27/2017  . Influenza-Unspecified 10/24/2013, 11/28/2016  . PFIZER SARS-COV-2 Vaccination 03/31/2019, 04/25/2019  . Pneumococcal Conjugate-13 11/03/2018  . Td 08/28/2002, 04/18/2008  . Zoster 08/20/2013  . Zoster Recombinat (Shingrix) 11/28/2016, 02/04/2017    Family History  Problem Relation Age of Onset  . Hypertension Mother   . Stroke Mother   . Heart disease Father   . Hypertension Father   . Cancer Maternal Grandmother 72       breast  . Diabetes Maternal Grandmother   . Colon cancer Neg Hx   . Esophageal cancer Neg Hx   . Rectal cancer  Neg Hx   . Stomach cancer Neg Hx      Current Facility-Administered Medications:  .  0.9 %  sodium chloride infusion, , Intravenous, Continuous, Mystique Bjelland, MD, Last Rate: 10 mL/hr at 08/06/19 0654, 500 mL at 08/06/19 0654 .  butamben-tetracaine-benzocaine (CETACAINE) spray 1 spray, 1 spray, Topical, Once, Amisha Pospisil, MD .  lidocaine (XYLOCAINE) 2 % jelly 1 application, 1 application, Topical, Once, Toni Hoffmeister, MD .  phenylephrine (NEO-SYNEPHRINE) 0.25 % nasal spray 1 spray, 1 spray, Each Nare, Q6H PRN, Brand Males, MD   Objective -Vital signs reviewed.  Physical exam shows she is alert and oriented x3.  Speech is normal.  Abdomen is soft.  Clear to auscultation bilaterally normal heart sounds.  Oral cavity is normal.  No cyanosis no clubbing no edema.  No neck nodes no elevated JVP.  Lab CT scan reviewed  Assessment/plan -Chronic cough with mild bronchiectasis right middle lobe greater than lingula. -History of remote pneumothorax with right-sided stapling  Plan  = Bronchoscopy with lavage.  Endobronchial biopsy if endobronchial lesion present.  No plans for transbronchial biopsy.  We had a discussion about her remote stapling on the right side.  A level just an extremely low risk procedure to to cause a pneumothorax but it is possible.  The right-sided findings are more prominent.  We discussed this.  My preference is to lavage the right side over the left side.  But will make a decision on the table.   Risks of  pneumothorax, hemothorax, sedation/anesthesia complications such as cardiac or respiratory arrest or hypotension, stroke and bleeding all explained. Benefits of diagnosis but limitations of non-diagnosis also explained. Patient verbalized understanding and wished to proceed.     Discussed with anesthesiologist at the bedside.      SIGNATURE    Dr. Brand Males, M.D., F.C.C.P,  Pulmonary and Critical Care Medicine Staff Physician, [place] Director - Interstitial Lung Disease  Program  Pulmonary [place] at [place], [place], [address]  Pager: [phone], If no answer or between  15:00h - 7:00h: call [phone] Telephone: [phone]  7:26 AM 08/06/2019

## 2019-08-06 NOTE — Op Note (Signed)
Name:  [name] MRN:  [id number] DOB:  Jul 20, 1955  PROCEDURE NOTE  Procedure(s): Flexible bronchoscopy [phone]) Bronchial alveolar lavage [phone]) of the right middle lobe  Indications:  Chronic cough and RML > Lingula bronchiectasis  Consent:  Procedure, benefits, risks and alternatives discussed.  Questions answered.  Consent obtained.  Anesthesia:  Moderate Sedation  Location: [facility] suite  Procedure summary:  Appropriate equipment was assembled.  The patient was brought to the procedure suite room and identified as [name] with 1955-04-22  Safety timeout was performed. The patient was placed supine on the  table, airway and moderate sedation administered by Rn under supervision of this operator  After the appropriate level of moderation was assured, flexible video bronchoscope was lubricated and inserted through the left naris 1% Lidocaine were administered through the bronchoscope to augment moderate sedation  Airway examination was yes performed bilaterally to subsegmental level.  Minimal clear secretions were noted, mucosa appeared normal and no endobronchial lesions were identified.  Bronchial alveolar lavage of the Right Middle Lobe was performed with 20 mL of normal saline  X 1 ffor discard and then 33m x 2 number of times for total volume of 100 mL. Total return of 40+  mL of fluid (clear grey normal looking), after which the bronchoscope was withdrawn.  After hemostasis was assure, the bronchoscope was withdrawn.  The patient was recovered and then  transferred to recovery area  Post-procedure chest x-ray was YES ordered.- due to prior stapling on right side  Specimens sent: Bronchial alveolar lavage specimen of the RML for cell count, microbiology and cytology.  Complications:  No immediate complications were noted.  Hemodynamic parameters and oxygenation remained stable throughout the procedure.  Estimated blood loss:  NONE  IMPRESSION 1.  Normal airway 2. RML - BAL 3. No biopsy  Followup Future Appointments  Date Time Provider DReserve 08/13/2019  2:00 PM TSueanne Margarita MD [facility]  08/20/2019  9:15 AM RBrand Males MD [facility] None      SIGNATURE    Dr. MBrand Males M.D., F.C.C.P,  Pulmonary and Critical Care Medicine Staff Physician, [place] - Interstitial Lung Disease  Program  [facility] [place] [id number] Pager: [phone] If no answer or between  15:00h - 7:00h: call [phone] Telephone: [phone]  8:09 AM 08/06/2019

## 2019-08-07 ENCOUNTER — Encounter (HOSPITAL_COMMUNITY): Payer: Self-pay | Admitting: Internal Medicine

## 2019-08-07 LAB — CYTOLOGY - NON PAP

## 2019-08-07 LAB — ACID FAST SMEAR (AFB, MYCOBACTERIA): Acid Fast Smear: NEGATIVE

## 2019-08-07 LAB — PNEUMOCYSTIS JIROVECI SMEAR BY DFA: Pneumocystis jiroveci Ag: NEGATIVE

## 2019-08-08 LAB — CULTURE, RESPIRATORY W GRAM STAIN: Culture: NORMAL

## 2019-08-09 LAB — MTB RIF NAA NON-SPUTUM, W/O CULTURE

## 2019-08-13 ENCOUNTER — Ambulatory Visit: Payer: Medicare PPO | Admitting: Cardiology

## 2019-08-13 ENCOUNTER — Other Ambulatory Visit: Payer: Self-pay

## 2019-08-13 ENCOUNTER — Encounter: Payer: Self-pay | Admitting: Cardiology

## 2019-08-13 VITALS — BP 130/70 | HR 66 | Ht 63.0 in | Wt 116.0 lb

## 2019-08-13 DIAGNOSIS — R9431 Abnormal electrocardiogram [ECG] [EKG]: Secondary | ICD-10-CM

## 2019-08-13 DIAGNOSIS — I2510 Atherosclerotic heart disease of native coronary artery without angina pectoris: Secondary | ICD-10-CM

## 2019-08-13 DIAGNOSIS — I10 Essential (primary) hypertension: Secondary | ICD-10-CM

## 2019-08-13 MED ORDER — METOPROLOL TARTRATE 100 MG PO TABS
100.0000 mg | ORAL_TABLET | Freq: Once | ORAL | 0 refills | Status: DC
Start: 2019-08-13 — End: 2019-10-23

## 2019-08-13 NOTE — Patient Instructions (Addendum)
Medication Instructions:  Your physician recommends that you continue on your current medications as directed. Please refer to the Current Medication list given to you today.  *If you need a refill on your cardiac medications before your next appointment, please call your pharmacy*  Testing/Procedures: Your physician has requested that you have a lexiscan myoview. For further information please visit [email or web address]. Please follow instruction sheet, as given.  Follow-Up: At [facility], you and your health needs are our priority.  As part of our continuing mission to provide you with exceptional heart care, we have created designated Provider Care Teams.  These Care Teams include your primary Cardiologist (physician) and Advanced Practice Providers (APPs -  Physician Assistants and Nurse Practitioners) who all work together to provide you with the care you need, when you need it.  Your next appointment:   1 year(s)  The format for your next appointment:   In Person  Provider:   You may see Fransico Him, MD or one of the following Advanced Practice Providers on your designated Care Team:    Melina Copa, PA-C  Ermalinda Barrios, PA-C  Other Instructions Your cardiac CT will be scheduled at one of the below locations:   [facility] [address] [place], [place] [address] [phone]  Please arrive at the [facility] main entrance of [facility] 30 minutes prior to test start time. Proceed to the [facility] Radiology Department (first floor) to check-in and test prep.  Please follow these instructions carefully (unless otherwise directed):   On the Night Before the Test: . Be sure to Drink plenty of water. . Do not consume any caffeinated/decaffeinated beverages or chocolate 12 hours prior to your test. . Do not take any antihistamines 12 hours prior to your test.  On the Day of the Test: . Drink plenty of water. Do not drink any water within one  hour of the test. . Do not eat any food 4 hours prior to the test. . You may take your regular medications prior to the test.  . Take metoprolol (Lopressor) two hours prior to test. . FEMALES- please wear underwire-free bra if available       After the Test: . Drink plenty of water. . After receiving IV contrast, you may experience a mild flushed feeling. This is normal. . On occasion, you may experience a mild rash up to 24 hours after the test. This is not dangerous. If this occurs, you can take Benadryl 25 mg and increase your fluid intake. . If you experience trouble breathing, this can be serious. If it is severe call 911 IMMEDIATELY. If it is mild, please call our office. . If you take any of these medications: Glipizide/Metformin, Avandament, Glucavance, please do not take 48 hours after completing test unless otherwise instructed.   Once we have confirmed authorization from your insurance company, we will call you to set up a date and time for your test.   For non-scheduling related questions, please contact the cardiac imaging nurse navigator should you have any questions/concerns: Marchia Bond, Cardiac Imaging Nurse Navigator Burley Saver, Interim Cardiac Imaging Nurse [place] [facility] Direct Office Dial: [phone]   For scheduling needs, including cancellations and rescheduling, please call [phone].

## 2019-08-13 NOTE — Progress Notes (Signed)
Cardiology Consult Note    Date:  08/13/2019   ID:  [name], DOB 1955-07-28, MRN [id number]  PCP:  Carollee Herter, Alferd Apa, DO  Cardiologist:  Fransico Him, MD   Chief Complaint  Patient presents with  . New Patient (Initial Visit)    coronary artery calcifications    History of Present Illness:  [name] is a 64 y.o. female who is being seen today for the evaluation of coronary artery calcifications at the request of Martyn Ehrich, NP.  This is a 64yo female with a hx of chronic cough (followed with Pulmonary), HTN and spontaneous PTX s/p VATS, who recently underwent HRCT for chronic cough and was found to have coronary atherosclerosis in the RCA and LAD and is now referred for evaluation. .  She denies any chest pain or pressure, SOB, DOE, PND, orthopnea, LE edema, dizziness, palpitations or syncope. She is compliant with her meds and is tolerating meds with no SE.    Past Medical History:  Diagnosis Date  . Allergy   . Atherosclerosis 07/08/2019  . Cataract   . Chronic cough 01/24/2019  . Costochondritis 01/24/2019  . Epigastric pain 07/29/2016  . FIBROIDS, UTERUS 01/18/2007   Qualifier: Diagnosis of  By: Jerold Coombe    . H/O Bell's palsy 05/2010  . Hypertension   . HYPERTENSION 03/24/2007   Qualifier: Diagnosis of  By: Ronnald Ramp CMA, Chemira    . MENORRHAGIA, PERIMENOPAUSAL 01/13/2007   Qualifier: Diagnosis of  By: Jerold Coombe    . MOLE 05/07/2010   Qualifier: Diagnosis of  By: Jerold Coombe    . Osteopenia 08/23/2014  . RUQ pain 07/29/2016  . Spontaneous pneumothorax 2005   R, s/p tube, then VATS  . Spontaneous tension pneumothorax 03/24/2007   Qualifier: Diagnosis of  By: Jerold Coombe      Past Surgical History:  Procedure Laterality Date  . BRONCHIAL WASHINGS  08/06/2019   Procedure: BRONCHIAL lavage;  Surgeon: Brand Males, MD;  Location: [facility];  Service: Endoscopy;;  . CESAREAN SECTION    . CHEST TUBE INSERTION  2005   spontaneus  Pneumothorax  . COLONOSCOPY    . SPIROMETRY  01/20/2015   Dr. Donneta Romberg  . VIDEO ASSISTED THORACOSCOPY  2005  . VIDEO BRONCHOSCOPY N/A 08/06/2019   Procedure: VIDEO BRONCHOSCOPY WITHOUT FLUORO;  Surgeon: Brand Males, MD;  Location: [facility];  Service: Endoscopy;  Laterality: N/A;  . WISDOM TOOTH EXTRACTION      Current Medications: Current Meds  Medication Sig  . acetaminophen (TYLENOL) 500 MG tablet Take 500 mg by mouth every 6 (six) hours as needed for moderate pain or headache.  Pamella Pert HFA 283-15 MCG/ACT inhaler Inhale 2 puffs into the lungs 2 (two) times daily.   Marland Kitchen albuterol (VENTOLIN HFA) 108 (90 Base) MCG/ACT inhaler Inhale 1-2 puffs into the lungs every 6 (six) hours as needed for wheezing or shortness of breath.  Marland Kitchen azelastine (ASTELIN) 0.1 % nasal spray Place 1 spray into both nostrils 2 (two) times daily.  . Calcium Carb-Cholecalciferol (CALCIUM 600 + D PO) Take 1 tablet by mouth daily.  . fluticasone (FLONASE) 50 MCG/ACT nasal spray Place 1 spray into both nostrils daily.  Marland Kitchen levocetirizine (XYZAL) 5 MG tablet Take 5 mg by mouth every evening.   . montelukast (SINGULAIR) 10 MG tablet Take 10 mg by mouth at bedtime.  . Omega-3 Fatty Acids (OMEGA-3 FISH OIL PO) Take 500 mg by mouth daily.   Marland Kitchen  spironolactone (ALDACTONE) 25 MG tablet Take 1 tablet (25 mg total) by mouth daily.  . valsartan (DIOVAN) 160 MG tablet Take 1 tablet (160 mg total) by mouth daily.   Current Facility-Administered Medications for the 08/13/19 encounter (Office Visit) with Sueanne Margarita, MD  Medication  . gi cocktail (Maalox,Lidocaine,Donnatal)    Allergies:   Patient has no known allergies.   Social History   Socioeconomic History  . Marital status: Married    Spouse name: Not on file  . Number of children: 3  . Years of education: Not on file  . Highest education level: Not on file  Occupational History  . 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 Smoking status: Former Smoker    Packs/day: 1.00    Years: 14.00    Pack years: 14.00    Types: Cigarettes    Quit date: 07/11/1982    Years since quitting: 37.1  . Smokeless tobacco: Never Used  Vaping Use  . Vaping Use: Never used  Substance and Sexual Activity  . Alcohol use: Yes    Alcohol/week: 0.0 standard drinks    Comment: rare  . Drug use: No  . Sexual activity: Yes    Partners: Male    Birth control/protection: Post-menopausal    Comment: 1st intercourse 64 yo-Fewer than 5 partners  Other Topics Concern  . Not on file  Social History Narrative   Exercise-- jazzersize  3x a week    Social Determinants of Health   Financial Resource Strain:   . Difficulty of Paying Living Expenses:   Food Insecurity:   . Worried About Charity fundraiser in the Last Year:   . Arboriculturist in the Last Year:   Transportation Needs:   . Film/video editor (Medical):   Marland Kitchen Lack of Transportation (Non-Medical):   Physical Activity:   . Days of Exercise per Week:   . Minutes of Exercise per Session:   Stress:   . Feeling of Stress :   Social Connections:   . Frequency of Communication with Friends and Family:   . Frequency of Social Gatherings with Friends and Family:   . Attends Religious Services:   . Active Member of Clubs or Organizations:   . Attends Archivist Meetings:   Marland Kitchen Marital Status:      Family History:  The patient's family history includes Cancer (age of onset: 59) in her maternal grandmother; Diabetes in her maternal grandmother; Heart disease in her father; Hypertension in her father and mother; Stroke in her mother.   ROS:   Please see the history of present illness.    ROS All other systems reviewed and are negative.  No flowsheet data found.     PHYSICAL EXAM:   VS:  BP 130/70   Pulse 66   Ht 5\' 3"  (1.6 m)   Wt 116 lb (52.6 kg)   SpO2 98%   BMI 20.55 kg/m    GEN: Well nourished, well developed, in no acute  distress  HEENT: normal  Neck: no JVD, carotid bruits, or masses Cardiac: RRR; no murmurs, rubs, or gallops,no edema.  Intact distal pulses bilaterally.  Respiratory:  clear to auscultation bilaterally, normal work of breathing GI: soft, nontender, nondistended, + BS MS: no deformity or atrophy  Skin: warm and dry, no rash Neuro:  Alert and Oriented x 3, Strength and sensation are intact Psych: euthymic mood, full affect  Wt  Readings from Last 3 Encounters:  08/13/19 116 lb (52.6 kg)  08/06/19 114 lb (51.7 kg)  07/04/19 119 lb 9.6 oz (54.3 kg)      Studies/Labs Reviewed:   EKG:  EKG is ordered today.  The ekg ordered today demonstrates NSR with RBBB  Recent Labs: 11/03/2018: ALT 19; BUN 10; Creatinine, Ser 0.58; Hemoglobin 12.5; Platelets 204.0; Potassium 3.4; Sodium 135   Lipid Panel    Component Value Date/Time   CHOL 177 11/03/2018 1356   TRIG 61.0 11/03/2018 1356   HDL 70.90 11/03/2018 1356   CHOLHDL 3 11/03/2018 1356   VLDL 12.2 11/03/2018 1356   LDLCALC 94 11/03/2018 1356   LDLDIRECT 111.7 05/07/2010 1333    Additional studies/ records that were reviewed today include:  Chest CTA    ASSESSMENT:    1. Coronary artery calcification seen on CAT scan   2. Essential hypertension      PLAN:  In order of problems listed above:  1.  Coronary artery calcifications -noted on recent HRCT for workup of cough -she has no hx of chest pain or pressure -her CRFs include HTN, postmenopausal, remote tobacco use and fm hx of CAD in her father -I will get a coronary Ca score to assess further and a Lexiscan myoview to rule out ischemia given that she has a RBBB  2.  HTN -Bp controlled on exam today -continue Valsartan 160mg  daily and Spiro 25mg  daily   Medication Adjustments/Labs and Tests Ordered: Current medicines are reviewed at length with the patient today.  Concerns regarding medicines are outlined above.  Medication changes, Labs and Tests ordered today are  listed in the Patient Instructions below.  There are no Patient Instructions on file for this visit.   Signed, Fransico Him, MD  08/13/2019 2:18 PM    [facility] [place], [place], [place]  [address] Phone: [phone]; Fax: [phone]

## 2019-08-15 ENCOUNTER — Telehealth (HOSPITAL_COMMUNITY): Payer: Self-pay

## 2019-08-15 ENCOUNTER — Encounter (HOSPITAL_COMMUNITY): Payer: Self-pay

## 2019-08-15 DIAGNOSIS — R05 Cough: Secondary | ICD-10-CM | POA: Diagnosis not present

## 2019-08-15 DIAGNOSIS — I2510 Atherosclerotic heart disease of native coronary artery without angina pectoris: Secondary | ICD-10-CM | POA: Diagnosis not present

## 2019-08-15 DIAGNOSIS — J479 Bronchiectasis, uncomplicated: Secondary | ICD-10-CM | POA: Diagnosis not present

## 2019-08-15 NOTE — Telephone Encounter (Signed)
Detailed instructions left on the patient's answering machine per DPR. Asked to call back with any questions. S.Coutney Wildermuth EMTP 

## 2019-08-20 ENCOUNTER — Telehealth (INDEPENDENT_AMBULATORY_CARE_PROVIDER_SITE_OTHER): Payer: Medicare PPO | Admitting: Internal Medicine

## 2019-08-20 ENCOUNTER — Encounter: Payer: Self-pay | Admitting: Internal Medicine

## 2019-08-20 DIAGNOSIS — Z8719 Personal history of other diseases of the digestive system: Secondary | ICD-10-CM | POA: Diagnosis not present

## 2019-08-20 DIAGNOSIS — R05 Cough: Secondary | ICD-10-CM | POA: Diagnosis not present

## 2019-08-20 DIAGNOSIS — R053 Chronic cough: Secondary | ICD-10-CM

## 2019-08-20 DIAGNOSIS — B379 Candidiasis, unspecified: Secondary | ICD-10-CM

## 2019-08-20 MED ORDER — FLUCONAZOLE 100 MG PO TABS
ORAL_TABLET | ORAL | 0 refills | Status: DC
Start: 2019-08-20 — End: 2019-10-23

## 2019-08-20 NOTE — Patient Instructions (Signed)
ICD-10-CM   1. Chronic cough  R05   2. History of gastroesophageal reflux (GERD)  Z87.19   3. Candida albicans infection  B37.9     Stop fish oil cotninue other medications Complete cardiac workup fluconazole 200 mg loading dose, followed by 100 mg daily for 7days    Followup  - august 2021 (early) with Dr Chase Caller (schedule will open up) or APP - > review bal results from mid June 2021 and consider gabapentin +/- changing advair to another MDI  - cough score at followup

## 2019-08-20 NOTE — Progress Notes (Signed)
OV 06/25/2019  Subjective:  Patient ID: [name] [name] [name], female , DOB: 1955-10-11 , age 64 y.o. , MRN: [id number] , ADDRESS: [address] [place] [facility] [address]   06/25/2019 -   Chief Complaint  Patient presents with   Consult    Pt is here due to having [name] [name] persistent dry cough for years. Pt also is being treated for [name] [name] pneumothorax. States her cough could be bad at any time of the day.     HPI [name] [name] [name] 64 y.o. -has been referred by Dr. Donneta Romberg allergist for evaluation of chronic cough.  Patient tells me that she has had insidious onset of chronic cough since the 1990s.  Since then it is persistent and is at the same level.  Cough is described as moderate.  Mostly dry except when she has [name] [name] flareup with an infection or bronchitis.  In 2005 she had [name] [name] spontaneous right pneumothorax after respiratory flareup.  This was treated initially by tube thoracostomy and then by pleurodesis by Dr. Caffie Pinto according to the patient's history.  Since then she has not had [name] [name] recurrence of pneumothorax but does continue to have chronic cough.  The cough is definitely made worse by talking [name] [name] lot or by inhaling fumes such as perfume or any smoke exposure.  She says the cough during this episodes can be so severe that she almost has [name] [name] whooping sound to it.  This not necessarily any associated shortness of breath.  The cough is mostly dry.  The cough does not wake her up at night.  Severity index is listed below.  Talking does make the cough worse.  Cough relevant history  -Hypertension: She is not on ACE inhibitor but is on ARB valsartan.  This is not associated with cough  -Allergy history: She did have onset of spring allergies in 1990s.  During this time allergy testing showed positive allergies to [name] [name] few things including mold and grass.  She says repeat allergy testing some years ago by Dr. Donneta Romberg revealed no allergies.  She is not on allergy shots but she is on antihistamines and nasal steroids.  -Sinus  history: She is not had [name] [name] CT scan of the sinus.  She does not have [name] [name] significant postnasal drip but she is also on antihistamines.  She has never been evaluated by ENT.  Acid reflux history: She denies any strong acid reflux.  She states in 20 years she is probably taken [name] [name] few bottles of Tums in total.  She is never seen GI.  However [name] [name] GI referral is under consideration by Dr. Donneta Romberg  -Chronic refractory cough: She does clear her throat.  - Verbal output hx: she is considered [name] [name] chatty person by her family. 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  During this time she had to talk [name] [name] lot.  -Imaging: Chest x-ray 2020 personally visualized and is clear.    CXR 01/23/2019  IMPRESSION: Stable postoperative chest with mild pleural thickening bilaterally. No evidence of recurrent pneumothorax or acute cardiopulmonary process.   Electronically Signed   By: Richardean Sale M.D.   On: 01/23/2019 14:44   ROS - per HPI  Results for Melissa, [name] (MRN [id number]) as of 06/25/2019 09:38  Ref. Range 08/20/2013 09:35 02/21/2014 10:40 08/23/2014 10:30 09/23/2015 16:35 07/29/2016 16:46 10/18/2016 10:06 10/21/2017 10:05 11/03/2018 13:56  Eosinophils Absolute Latest Ref Range: 0.0 - 0.7 K/uL 0.2 0.1 0.1 0.1 0.1 0.1 0.1 0.1   Results for [name] [name] [name], [name]  [name] [name] (MRN [id number]) as of 06/25/2019 09:38  Ref. Range 03/22/2017 13:54  Influenza [name] [name], POC Latest Ref Range: Negative  Positive ([name] [name])  Influenza B, POC Latest Ref Range: Negative  Negative      07/04/2019 ov - beth walsh Patient presents today for 10-day follow-up to review CT imaging. She has had [name] [name] chronic cough for [name] [name] long times. She takes montelukast and Xyzal. Uses maintenance inahler Advair hfa 115-21 twice daily and prn albuterol hfa prior to exercise. She states that nothing helps her cough. Triggers include strong scents, tart wine and spicy food. She has post nasal drip symptoms. She gets out of breath with speaking. She has no issues with 45 min  exercise class. No significant shortness of breath. No obvious reflux symptoms. She has [name] [name] known hypodensity liver stable 2014. She does have some epigastric pain. She has an upcoming apt with GI but may rescheduled until after bronchscopy.   Imaging:  CT showed scattered mild cylindrical bronchiectasis mid lungs with associated mild patchy tree-in-bud opacities and mild regions of subpleural scarring.  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  Few scattered solid pulmonary nodules in both lungs largest measuring 6 mm right lower lobe. 3. Two-vessel coronary atherosclerosis. 4. Aortic Atherosclerosis (ICD10-I70.0) and Emphysema   06/30/19 CT maxillofacial - Negative for sinusitis    OV 08/20/2019  Subjective:  Patient ID: [name] [name] [name], female , DOB: [birth year] , age 64 y.o. , MRN: [id number] , ADDRESS: [address] [place] [facility] [address]   08/20/2019 -   Chief Complaint  Patient presents with   Follow-up    still has [name] [name] dry cough     HPI [name] [name] [name] 64 y.o. -video visit for chronic cough.  This is still follow-up with bronchoscopy results.  She had bronchoscopy mid June 2021.  She had normal airway exam.  Right middle lobe lavage shows neutrophilia.  She is growing Candida albicans.  She is not immunocompromise per her history.  On this video visit she is joined by her husband.  Husband and she report that she coughs early in the morning.  She has seen Dr. Benson Norway of GI who is advised expectant approach.  Certainly he will offer more testing according to history if her symptoms persist.  Medication review shows she is on Advair MDI and also she is taking fish oil.  She says she been taking fish oil for many years.  She has never been on gabapentin but is willing to try this in the future.  She is aware of the concept of neurogenic cough.    Dr Lorenza Cambridge Reflux Symptom Index (> 13-15 suggestive of LPR cough)  06/25/2019   Hoarseness of problem with voice 3   Clearing  Of Throat 1  Excess throat mucus or feeling of post nasal drip 1  Difficulty swallowing food, liquid or tablets 0  Cough after eating or lying down 2  Breathing difficulties or choking episodes 3  Troublesome or annoying cough 5  Sensation of something sticking in throat or lump in throat 2  Heartburn, chest pain, indigestion, or stomach acid coming up 1  TOTAL 17     Results for [name] [name] [name], [name] [name] [name] (MRN [id number]) as of 08/20/2019 09:24  Ref. Range 08/06/2019 07:28  Monocyte-Macrophage-Serous Fluid Latest Ref Range: 50 - 90 % 28 (L)  Other Cells, Fluid Latest Units: % CORRELATE WITH CYTOLOGY.  Fluid Type-FCT Unknown Bronch Lavag  Color, Fluid Latest Ref Range: YELLOW  COLORLESS ([name] [name])  Total Nucleated Cell  Count, Fluid Latest Ref Range: 0 - 1,000 cu mm 325  Lymphs, Fluid Latest Units: % 5  Appearance, Fluid Latest Ref Range: CLEAR  HAZY ([name] [name])  Neutrophil Count, Fluid Latest Ref Range: 0 - 25 % 67 (H)    Component 2 wk ago  Fungal result 1 Candida albicansAbnormal   AFB Specimen Processing Concentration   Acid Fast Smear Negative      ROS - per HPI     has [name] [name] past medical history of Allergy, Atherosclerosis (07/08/2019), Cataract, Chronic cough (01/24/2019), Costochondritis (01/24/2019), Epigastric pain (07/29/2016), FIBROIDS, UTERUS (01/18/2007), H/O Bell's palsy (05/2010), Hypertension, HYPERTENSION (03/24/2007), MENORRHAGIA, PERIMENOPAUSAL (01/13/2007), MOLE (05/07/2010), Osteopenia (08/23/2014), RUQ pain (07/29/2016), Spontaneous pneumothorax (2005), and Spontaneous tension pneumothorax (03/24/2007).   reports that she quit smoking about 37 years ago. Her smoking use included cigarettes. She has [name] [name] 14.00 pack-year smoking history. She has never used smokeless tobacco.  Past Surgical History:  Procedure Laterality Date   BRONCHIAL WASHINGS  08/06/2019   Procedure: BRONCHIAL lavage;  Surgeon: Brand Males, MD;  Location: [facility];  Service: Endoscopy;;   CESAREAN SECTION      CHEST TUBE INSERTION  2005   spontaneus Pneumothorax   COLONOSCOPY     SPIROMETRY  01/20/2015   Dr. Donneta Romberg   VIDEO ASSISTED THORACOSCOPY  2005   VIDEO BRONCHOSCOPY N/[name] [name] 08/06/2019   Procedure: VIDEO BRONCHOSCOPY WITHOUT FLUORO;  Surgeon: Brand Males, MD;  Location: [facility];  Service: Endoscopy;  Laterality: N/[name] [name];   WISDOM TOOTH EXTRACTION      No Known Allergies  Immunization History  Administered Date(s) Administered   DTaP 02/03/2019   Fluad Quad(high Dose 65+) 11/03/2018   Influenza Whole 11/30/2006, 12/13/2007, 01/01/2009, 12/31/2009   Influenza,inj,Quad PF,6+ Mos 11/27/2017   Influenza-Unspecified 10/24/2013, 11/28/2016   PFIZER SARS-COV-2 Vaccination 03/31/2019, 04/25/2019   Pneumococcal Conjugate-13 11/03/2018   Td 08/28/2002, 04/18/2008   Zoster 08/20/2013   Zoster Recombinat (Shingrix) 11/28/2016, 02/04/2017    Family History  Problem Relation Age of Onset   Hypertension Mother    Stroke Mother    Heart disease Father    Hypertension Father    Cancer Maternal Grandmother 72       breast   Diabetes Maternal Grandmother    Colon cancer Neg Hx    Esophageal cancer Neg Hx    Rectal cancer Neg Hx    Stomach cancer Neg Hx      Current Outpatient Medications:    acetaminophen (TYLENOL) 500 MG tablet, Take 500 mg by mouth every 6 (six) hours as needed for moderate pain or headache., Disp: , Rfl:    ADVAIR HFA 115-21 MCG/ACT inhaler, Inhale 2 puffs into the lungs 2 (two) times daily. , Disp: , Rfl:    albuterol (VENTOLIN HFA) 108 (90 Base) MCG/ACT inhaler, Inhale 1-2 puffs into the lungs every 6 (six) hours as needed for wheezing or shortness of breath., Disp: , Rfl:    azelastine (ASTELIN) 0.1 % nasal spray, Place 1 spray into both nostrils 2 (two) times daily., Disp: , Rfl: 3   Calcium Carb-Cholecalciferol (CALCIUM 600 + D PO), Take 1 tablet by mouth daily., Disp: , Rfl:    fluticasone (FLONASE) 50 MCG/ACT nasal spray,  Place 1 spray into both nostrils daily., Disp: , Rfl:    levocetirizine (XYZAL) 5 MG tablet, Take 5 mg by mouth every evening. , Disp: , Rfl:    metoprolol tartrate (LOPRESSOR) 100 MG tablet, Take 1 tablet (100 mg total) by mouth once for 1 dose. Take 1  tablet (100 mg) two hours prior to CT scan., Disp: 1 tablet, Rfl: 0   montelukast (SINGULAIR) 10 MG tablet, Take 10 mg by mouth at bedtime., Disp: , Rfl:    Omega-3 Fatty Acids (OMEGA-3 FISH OIL PO), Take 500 mg by mouth daily. , Disp: , Rfl:    spironolactone (ALDACTONE) 25 MG tablet, Take 1 tablet (25 mg total) by mouth daily., Disp: 90 tablet, Rfl: 3   valsartan (DIOVAN) 160 MG tablet, Take 1 tablet (160 mg total) by mouth daily., Disp: 90 tablet, Rfl: 3  Current Facility-Administered Medications:    gi cocktail (Maalox,Lidocaine,Donnatal), 30 mL, Oral, Once, [employer], [place] R, DO      Objective:   There were no vitals filed for this visit.  Estimated body mass index is 20.55 kg/m as calculated from the following:   Height as of 08/13/19: _0  (1.6 m).   Weight as of 08/13/19: 116 lb (52.6 kg).  _1 @  There were no vitals filed for this visit.   Physical Exam Look normal on video.  Occasional laryngeal quality of cough.        Assessment:       ICD-10-CM   1. Chronic cough  R05   2. History of gastroesophageal reflux (GERD)  Z87.19   3. Candida albicans infection  B37.9        Plan:     Patient Instructions     ICD-10-CM   1. Chronic cough  R05   2. History of gastroesophageal reflux (GERD)  Z87.19   3. Candida albicans infection  B37.9     Stop fish oil cotninue other medications Complete cardiac workup fluconazole 200 mg loading dose, followed by 100 mg daily for 7days    Followup  - august 2021 (early) with Dr Chase Caller (schedule will open up) or APP - > review bal results from mid June 2021 and consider gabapentin +/- changing advair to another MDI  - cough score at  followup     SIGNATURE    Dr. Brand Males, M.D., F.C.C.P,  Pulmonary and Critical Care Medicine Staff Physician, [place] Director - Interstitial Lung Disease  Program  [facility] [place], [place], [address]  Pager: [phone], If no answer or between  15:00h - 7:00h: call [phone] Telephone: [phone]  9:38 AM 08/20/2019

## 2019-08-21 ENCOUNTER — Other Ambulatory Visit: Payer: Self-pay

## 2019-08-21 ENCOUNTER — Ambulatory Visit (HOSPITAL_COMMUNITY): Payer: Medicare PPO | Attending: Cardiovascular Disease

## 2019-08-21 DIAGNOSIS — R9431 Abnormal electrocardiogram [ECG] [EKG]: Secondary | ICD-10-CM

## 2019-08-21 LAB — MYOCARDIAL PERFUSION IMAGING
LV dias vol: 59 mL (ref 46–106)
LV sys vol: 19 mL
Peak HR: 101 {beats}/min
Rest HR: 63 {beats}/min
SDS: 3
SRS: 2
SSS: 5
TID: 0.98

## 2019-08-21 MED ORDER — TECHNETIUM TC 99M TETROFOSMIN IV KIT
31.1000 | PACK | Freq: Once | INTRAVENOUS | Status: AC | PRN
  Administered 2019-08-21: 31.1 via INTRAVENOUS
  Filled 2019-08-21: qty 32

## 2019-08-21 MED ORDER — TECHNETIUM TC 99M TETROFOSMIN IV KIT
10.5000 | PACK | Freq: Once | INTRAVENOUS | Status: AC | PRN
  Administered 2019-08-21: 10.5 via INTRAVENOUS
  Filled 2019-08-21: qty 11

## 2019-08-21 MED ORDER — REGADENOSON 0.4 MG/5ML IV SOLN
0.4000 mg | Freq: Once | INTRAVENOUS | Status: AC
Start: 2019-08-21 — End: 2019-08-21
  Administered 2019-08-21: 0.4 mg via INTRAVENOUS

## 2019-08-24 ENCOUNTER — Telehealth: Payer: Self-pay

## 2019-08-24 DIAGNOSIS — I2510 Atherosclerotic heart disease of native coronary artery without angina pectoris: Secondary | ICD-10-CM

## 2019-08-24 NOTE — Telephone Encounter (Signed)
-----   Message from Sueanne Margarita, MD sent at 08/21/2019  5:22 PM EDT ----- Has she gotten her coronary Ca score done yet - I did not see it resulted

## 2019-08-24 NOTE — Telephone Encounter (Signed)
Orders were originally placed for coronary CTA. Will cancel orders and replace for calcium score. Patient is aware.

## 2019-09-06 LAB — FUNGUS CULTURE WITH STAIN

## 2019-09-06 LAB — FUNGAL ORGANISM REFLEX

## 2019-09-06 LAB — FUNGUS CULTURE RESULT

## 2019-09-10 ENCOUNTER — Ambulatory Visit (INDEPENDENT_AMBULATORY_CARE_PROVIDER_SITE_OTHER)
Admission: RE | Admit: 2019-09-10 | Discharge: 2019-09-10 | Disposition: A | Discharge status: Home / Self Care | Payer: Self-pay | Source: Ambulatory Visit | Attending: Cardiology | Admitting: Cardiology

## 2019-09-10 ENCOUNTER — Other Ambulatory Visit: Payer: Self-pay

## 2019-09-10 DIAGNOSIS — I700 Atherosclerosis of aorta: Secondary | ICD-10-CM | POA: Diagnosis not present

## 2019-09-10 DIAGNOSIS — R918 Other nonspecific abnormal finding of lung field: Secondary | ICD-10-CM | POA: Diagnosis not present

## 2019-09-10 DIAGNOSIS — I2510 Atherosclerotic heart disease of native coronary artery without angina pectoris: Secondary | ICD-10-CM

## 2019-09-10 DIAGNOSIS — R911 Solitary pulmonary nodule: Secondary | ICD-10-CM | POA: Diagnosis not present

## 2019-09-11 ENCOUNTER — Telehealth: Payer: Self-pay

## 2019-09-11 DIAGNOSIS — I2510 Atherosclerotic heart disease of native coronary artery without angina pectoris: Secondary | ICD-10-CM

## 2019-09-11 NOTE — Telephone Encounter (Signed)
Orders placed for FLP. Patient scheduled to come in tomorrow 07/21

## 2019-09-11 NOTE — Telephone Encounter (Signed)
-----   Message from Sueanne Margarita, MD sent at 09/11/2019  9:53 AM EDT ----- Yes please so we can get her on statin ----- Message ----- From: Antonieta Iba, RN Sent: 09/11/2019   9:51 AM EDT To: Sueanne Margarita, MD  Patient's last FLP was 10/2018 (in [place]). LDL was 94. She is going back for her next physical/cholesterol check in 10/2019. Do you want her to come in for FLP prior to that?

## 2019-09-12 ENCOUNTER — Other Ambulatory Visit: Payer: Medicare PPO | Admitting: *Deleted

## 2019-09-12 ENCOUNTER — Other Ambulatory Visit: Payer: Self-pay

## 2019-09-12 DIAGNOSIS — I2510 Atherosclerotic heart disease of native coronary artery without angina pectoris: Secondary | ICD-10-CM | POA: Diagnosis not present

## 2019-09-12 LAB — LIPID PANEL
Chol/HDL Ratio: 2.3 ratio (ref 0.0–4.4)
Cholesterol, Total: 173 mg/dL (ref 100–199)
HDL: 75 mg/dL (ref >39)
LDL Chol Calc (NIH): 86 mg/dL (ref 0–99)
Triglycerides: 59 mg/dL (ref 0–149)
VLDL Cholesterol Cal: 12 mg/dL (ref 5–40)

## 2019-09-13 ENCOUNTER — Telehealth: Payer: Self-pay

## 2019-09-13 MED ORDER — ATORVASTATIN CALCIUM 20 MG PO TABS
20.0000 mg | ORAL_TABLET | Freq: Every day | ORAL | 3 refills | Status: DC
Start: 2019-09-13 — End: 2020-08-06

## 2019-09-13 NOTE — Telephone Encounter (Signed)
-----   Message from Sueanne Margarita, MD sent at 09/12/2019 10:27 PM EDT ----- LDL too high, goal < 70.  Please start Lipitor 20mg  daily and repeat FLP and ALT in 6 weeks

## 2019-09-13 NOTE — Telephone Encounter (Signed)
The patient has been notified of the result and verbalized understanding.  All questions (if any) were answered. Antonieta Iba, RN 09/13/2019 12:26 PM  Prescription sent in for Lipitor 20 mg daily. Patient is having yearly physical at PCP in September and will have lipids rechecked there.

## 2019-09-19 LAB — ACID FAST CULTURE WITH REFLEXED SENSITIVITIES (MYCOBACTERIA): Acid Fast Culture: NEGATIVE

## 2019-10-23 ENCOUNTER — Other Ambulatory Visit: Payer: Self-pay

## 2019-10-23 ENCOUNTER — Ambulatory Visit: Payer: Medicare PPO | Admitting: Internal Medicine

## 2019-10-23 ENCOUNTER — Encounter: Payer: Self-pay | Admitting: Internal Medicine

## 2019-10-23 VITALS — BP 128/70 | HR 86 | Temp 96.5°F | Ht 62.5 in | Wt 116.0 lb

## 2019-10-23 DIAGNOSIS — J387 Other diseases of larynx: Secondary | ICD-10-CM

## 2019-10-23 DIAGNOSIS — R05 Cough: Secondary | ICD-10-CM

## 2019-10-23 DIAGNOSIS — R053 Chronic cough: Secondary | ICD-10-CM

## 2019-10-23 MED ORDER — GABAPENTIN 300 MG PO CAPS
300.0000 mg | ORAL_CAPSULE | Freq: Three times a day (TID) | ORAL | 3 refills | Status: DC
Start: 2019-10-23 — End: 2020-08-06

## 2019-10-23 NOTE — Patient Instructions (Addendum)
ICD-10-CM   1. Chronic cough  R05   2. 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 Garald Balding (neuro rehab)  -Overall try to cut down your voice output otherwise talk less by 50% -Take 2-3 days where you are in complete silence without any talking or whispering -Anytime you have the urge to cough or clear your throat try sugarless lozenge or drink cold water - Take gabapentin 300mg  once daily x 7 days, then 300mg  twice daily x 7 days, then 300mg  three times daily to continue. If this makes you too sleepy or drowsy call us and we will cut your medication dosing down -Continue Flonase, Singulair and Xyzal as before  Followup 8 weeks with Dr. Chase Caller to report progress return sooner if needed  -RSI cough score at follow-up

## 2019-10-23 NOTE — Progress Notes (Signed)
OV 06/25/2019  Subjective:  Patient ID: [name], female , DOB: 01-05-1956 , age 64 y.o. , MRN: [id number] , ADDRESS: [address] [place] [facility] [address]   06/25/2019 -   Chief Complaint  Patient presents with  . Consult    Pt is here due to having a persistent dry cough for years. Pt also is being treated for a pneumothorax. States her cough could be bad at any time of the day.     HPI [name] 64 y.o. -has been referred by Dr. Donneta Romberg allergist for evaluation of chronic cough.  Patient tells me that she has had insidious onset of chronic cough since the 1990s.  Since then it is persistent and is at the same level.  Cough is described as moderate.  Mostly dry except when she has a flareup with an infection or bronchitis.  In 2005 she had a spontaneous right pneumothorax after respiratory flareup.  This was treated initially by tube thoracostomy and then by pleurodesis by Dr. Caffie Pinto according to the patient's history.  Since then she has not had a recurrence of pneumothorax but does continue to have chronic cough.  The cough is definitely made worse by talking a lot or by inhaling fumes such as perfume or any smoke exposure.  She says the cough during this episodes can be so severe that she almost has a whooping sound to it.  This not necessarily any associated shortness of breath.  The cough is mostly dry.  The cough does not wake her up at night.  Severity index is listed below.  Talking does make the cough worse.  Cough relevant history  -Hypertension: She is not on ACE inhibitor but is on ARB valsartan.  This is not associated with cough  -Allergy history: She did have onset of spring allergies in 1990s.  During this time allergy testing showed positive allergies to a few things including mold and grass.  She says repeat allergy testing some years ago by Dr. Donneta Romberg revealed no allergies.  She is not on allergy shots but she is on antihistamines and nasal steroids.  -Sinus  history: She is not had a CT scan of the sinus.  She does not have a significant postnasal drip but she is also on antihistamines.  She has never been evaluated by ENT.  Acid reflux history: She denies any strong acid reflux.  She states in 20 years she is probably taken a few bottles of Tums in total.  She is never seen GI.  However a GI referral is under consideration by Dr. Donneta Romberg  -Chronic refractory cough: She does clear her throat.  - Verbal output hx: she is considered a chatty person by her family. Job was a Pharmacologist at [employer].  During this time she had to talk a lot.  -Imaging: Chest x-ray 2020 personally visualized and is clear.    CXR 01/23/2019  IMPRESSION: Stable postoperative chest with mild pleural thickening bilaterally. No evidence of recurrent pneumothorax or acute cardiopulmonary process.   Electronically Signed   By: Richardean Sale M.D.   On: 01/23/2019 14:44   ROS - per HPI  Results for Melissa, [name] (MRN [id number]) as of 06/25/2019 09:38  Ref. Range 08/20/2013 09:35 02/21/2014 10:40 08/23/2014 10:30 09/23/2015 16:35 07/29/2016 16:46 10/18/2016 10:06 10/21/2017 10:05 11/03/2018 13:56  Eosinophils Absolute Latest Ref Range: 0.0 - 0.7 K/uL 0.2 0.1 0.1 0.1 0.1 0.1 0.1 0.1   Results for  Melissa, [name] (MRN [id number]) as of 06/25/2019 09:38  Ref. Range 03/22/2017 13:54  Influenza A, POC Latest Ref Range: Negative  Positive (A)  Influenza B, POC Latest Ref Range: Negative  Negative      07/04/2019 ov - beth walsh Patient presents today for 10-day follow-up to review CT imaging. She has had a chronic cough for a long times. She takes montelukast and Xyzal. Uses maintenance inahler Advair hfa 115-21 twice daily and prn albuterol hfa prior to exercise. She states that nothing helps her cough. Triggers include strong scents, tart wine and spicy food. She has post nasal drip symptoms. She gets out of breath with speaking. She has no issues with 45 min  exercise class. No significant shortness of breath. No obvious reflux symptoms. She has a known hypodensity liver stable 2014. She does have some epigastric pain. She has an upcoming apt with GI but may rescheduled until after bronchscopy.   Imaging:  CT showed scattered mild cylindrical bronchiectasis mid lungs with associated mild patchy tree-in-bud opacities and mild regions of subpleural scarring.  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  Few scattered solid pulmonary nodules in both lungs largest measuring 6 mm right lower lobe. 3. Two-vessel coronary atherosclerosis. 4. Aortic Atherosclerosis (ICD10-I70.0) and Emphysema   06/30/19 CT maxillofacial - Negative for sinusitis    OV 08/20/2019  Subjective:  Patient ID: [name], female , DOB: 1955-12-26 , age 64 y.o. , MRN: [id number] , ADDRESS: [place] [id number]   08/20/2019 -   Chief Complaint  Patient presents with  . Follow-up    still has a dry cough     HPI [name] 64 y.o. -video visit for chronic cough.  This is still follow-up with bronchoscopy results.  She had bronchoscopy mid June 2021.  She had normal airway exam.  Right middle lobe lavage shows neutrophilia.  She is growing Candida albicans.  She is not immunocompromise per her history.  On this video visit she is joined by her husband.  Husband and she report that she coughs early in the morning.  She has seen Dr. Benson Norway of GI who is advised expectant approach.  Certainly he will offer more testing according to history if her symptoms persist.  Medication review shows she is on Advair MDI and also she is taking fish oil.  She says she been taking fish oil for many years.  She has never been on gabapentin but is willing to try this in the future.  She is aware of the concept of neurogenic cough.    Dr Lorenza Cambridge Reflux Symptom Index (> 13-15 suggestive of LPR cough)  06/25/2019   Hoarseness of problem with voice 3   Clearing  Of Throat 1  Excess throat mucus or feeling of post nasal drip 1  Difficulty swallowing food, liquid or tablets 0  Cough after eating or lying down 2  Breathing difficulties or choking episodes 3  Troublesome or annoying cough 5  Sensation of something sticking in throat or lump in throat 2  Heartburn, chest pain, indigestion, or stomach acid coming up 1  TOTAL 17     Results for LILIANE, MALLIS (MRN [id number]) as of 08/20/2019 09:24  Ref. Range 08/06/2019 07:28  Monocyte-Macrophage-Serous Fluid Latest Ref Range: 50 - 90 % 28 (L)  Other Cells, Fluid Latest Units: % CORRELATE WITH CYTOLOGY.  Fluid Type-FCT Unknown Bronch Lavag  Color, Fluid Latest Ref Range: YELLOW  COLORLESS (A)  Total  Nucleated Cell Count, Fluid Latest Ref Range: 0 - 1,000 cu mm 325  Lymphs, Fluid Latest Units: % 5  Appearance, Fluid Latest Ref Range: CLEAR  HAZY (A)  Neutrophil Count, Fluid Latest Ref Range: 0 - 25 % 67 (H)    Component 2 wk ago  Fungal result 1 Candida albicansAbnormal   AFB Specimen Processing Concentration   Acid Fast Smear Negative     OV 10/23/2019  Subjective:  Patient ID: [name], female , DOB: 04-26-1955 , age 20 y.o. , MRN: [id number] , ADDRESS: [address] [place] [address]   10/23/2019 -   Chief Complaint  Patient presents with  . Follow-up    Chronic cough     HPI [name] 64 y.o. -returns for follow-up of her chronic cough.  After her last visit bronchoalveolar lavage cultures continue to be negative other than Candida as above.  She thinks the Candida might have been due to the different steroid inhaler she is taking in the past.  Most recently she has been on Advair for 6 months.  She notices no difference because of it.  She continues to have a cough with clearing of the throat.  She states ice helps continue to soothe her cough.  She says it is random.  She is clearing her throat.  Stopping fish oil has not helped her.  Taking 1 week of  Diflucan has not helped her.  She wants to try other interventions to get rid of her symptoms.  However on the symptom questionnaire she is some better which she later admitted to that she was some better but not fully better.     Dr Lorenza Cambridge Reflux Symptom Index (> 13-15 suggestive of LPR cough)  06/25/2019  10/23/2019   Hoarseness of problem with voice 3 2  Clearing  Of Throat 1 2  Excess throat mucus or feeling of post nasal drip 1 1  Difficulty swallowing food, liquid or tablets 0 0  Cough after eating or lying down 2 0  Breathing difficulties or choking episodes 3 2  Troublesome or annoying cough 5 4  Sensation of something sticking in throat or lump in throat 2 0  Heartburn, chest pain, indigestion, or stomach acid coming up 1 0  TOTAL 17 11    ROS - per HPI  PFT Results Latest Ref Rng & Units 08/02/2019  FVC-Pre L 2.76  FVC-Predicted Pre % 91  FVC-Post L 2.87  FVC-Predicted Post % 95  Pre FEV1/FVC % % 79  Post FEV1/FCV % % 79  FEV1-Pre L 2.19  FEV1-Predicted Pre % 95  FEV1-Post L 2.25  DLCO uncorrected ml/min/mmHg 19.12  DLCO UNC% % 100  DLCO corrected ml/min/mmHg 19.12  DLCO COR %Predicted % 100  DLVA Predicted % 102  TLC L 4.87  TLC % Predicted % 99  RV % Predicted % 102      has a past medical history of Allergy, Atherosclerosis (07/08/2019), Cataract, Chronic cough (01/24/2019), Costochondritis (01/24/2019), Epigastric pain (07/29/2016), FIBROIDS, UTERUS (01/18/2007), H/O Bell's palsy (05/2010), Hypertension, HYPERTENSION (03/24/2007), MENORRHAGIA, PERIMENOPAUSAL (01/13/2007), MOLE (05/07/2010), Osteopenia (08/23/2014), RUQ pain (07/29/2016), Spontaneous pneumothorax (2005), and Spontaneous tension pneumothorax (03/24/2007).   reports that she quit smoking about 37 years ago. Her smoking use included cigarettes. She has a 14.00 pack-year smoking history. She has never used smokeless tobacco.  Past Surgical History:  Procedure Laterality Date  . BRONCHIAL WASHINGS   08/06/2019   Procedure: BRONCHIAL lavage;  Surgeon: Brand Males, MD;  Location: [facility];  Service: Endoscopy;;  . CESAREAN SECTION    . CHEST TUBE INSERTION  2005   spontaneus Pneumothorax  . COLONOSCOPY    . SPIROMETRY  01/20/2015   Dr. Donneta Romberg  . VIDEO ASSISTED THORACOSCOPY  2005  . VIDEO BRONCHOSCOPY N/A 08/06/2019   Procedure: VIDEO BRONCHOSCOPY WITHOUT FLUORO;  Surgeon: Brand Males, MD;  Location: [facility];  Service: Endoscopy;  Laterality: N/A;  . WISDOM TOOTH EXTRACTION      No Known Allergies  Immunization History  Administered Date(s) Administered  . DTaP 02/03/2019  . Fluad Quad(high Dose 65+) 11/03/2018  . Influenza Whole 11/30/2006, 12/13/2007, 01/01/2009, 12/31/2009  . Influenza,inj,Quad PF,6+ Mos 11/27/2017  . Influenza-Unspecified 10/24/2013, 11/28/2016  . PFIZER SARS-COV-2 Vaccination 03/31/2019, 04/25/2019  . Pneumococcal Conjugate-13 11/03/2018  . Td 08/28/2002, 04/18/2008  . Zoster 08/20/2013  . Zoster Recombinat (Shingrix) 11/28/2016, 02/04/2017    Family History  Problem Relation Age of Onset  . Hypertension Mother   . Stroke Mother   . Heart disease Father   . Hypertension Father   . Cancer Maternal Grandmother 72       breast  . Diabetes Maternal Grandmother   . Colon cancer Neg Hx   . Esophageal cancer Neg Hx   . Rectal cancer Neg Hx   . Stomach cancer Neg Hx      Current Outpatient Medications:  .  acetaminophen (TYLENOL) 500 MG tablet, Take 500 mg by mouth every 6 (six) hours as needed for moderate pain or headache., Disp: , Rfl:  .  ADVAIR HFA 115-21 MCG/ACT inhaler, Inhale 2 puffs into the lungs 2 (two) times daily. , Disp: , Rfl:  .  albuterol (VENTOLIN HFA) 108 (90 Base) MCG/ACT inhaler, Inhale 1-2 puffs into the lungs every 6 (six) hours as needed for wheezing or shortness of breath., Disp: , Rfl:  .  atorvastatin (LIPITOR) 20 MG tablet, Take 1 tablet (20 mg total) by mouth daily., Disp: 90 tablet, Rfl: 3 .   azelastine (ASTELIN) 0.1 % nasal spray, Place 1 spray into both nostrils 2 (two) times daily., Disp: , Rfl: 3 .  Calcium Carb-Cholecalciferol (CALCIUM 600 + D PO), Take 1 tablet by mouth daily., Disp: , Rfl:  .  fluticasone (FLONASE) 50 MCG/ACT nasal spray, Place 1 spray into both nostrils daily., Disp: , Rfl:  .  levocetirizine (XYZAL) 5 MG tablet, Take 5 mg by mouth every evening. , Disp: , Rfl:  .  montelukast (SINGULAIR) 10 MG tablet, Take 10 mg by mouth at bedtime., Disp: , Rfl:  .  spironolactone (ALDACTONE) 25 MG tablet, Take 1 tablet (25 mg total) by mouth daily., Disp: 90 tablet, Rfl: 3 .  valsartan (DIOVAN) 160 MG tablet, Take 1 tablet (160 mg total) by mouth daily., Disp: 90 tablet, Rfl: 3  Current Facility-Administered Medications:  .  gi cocktail (Maalox,Lidocaine,Donnatal), 30 mL, Oral, Once, [employer], [place] R, DO      Objective:   Vitals:   10/23/19 1329  BP: 128/70  Pulse: 86  Temp: (!) 96.5 F (35.8 C)  TempSrc: Oral  SpO2: 98%  Weight: 116 lb (52.6 kg)  Height: 5' 2.5" (1.588 m)    Estimated body mass index is 20.88 kg/m as calculated from the following:   Height as of this encounter: 5' 2.5" (1.588 m).   Weight as of this encounter: 116 lb (52.6 kg).  _0 @  Filed Weights   10/23/19 1329  Weight: 116 lb (52.6 kg)     Physical Exam  General Appearance:    Alert, cooperative, no distress, appears stated age - yes , Deconditioned looking - no , OBESE  - no, Sitting on Wheelchair -  no  Head:    Normocephalic, without obvious abnormality, atraumatic  Eyes:    PERRL, conjunctiva/corneas clear,  Ears:    Normal TM's and external ear canals, both ears  Nose:   Nares normal, septum midline, mucosa normal, no drainage    or sinus tenderness. OXYGEN ON  - no . Patient is @ ra   Throat:   Lips, mucosa, and tongue normal; teeth and gums normal. Cyanosis on lips - no  Neck:   Supple, symmetrical, trachea midline, no adenopathy;    thyroid:  no  enlargement/tenderness/nodules; no carotid   bruit or JVD  Back:     Symmetric, no curvature, ROM normal, no CVA tenderness  Lungs:     Distress - no , Wheeze no, Barrell Chest - no, Purse lip breathing - no, Crackles - no   Chest Wall:    No tenderness or deformity.    Heart:    Regular rate and rhythm, S1 and S2 normal, no rub   or gallop, Murmur - no  Breast Exam:    NOT DONE  Abdomen:     Soft, non-tender, bowel sounds active all four quadrants,    no masses, no organomegaly. Visceral obesity - no  Genitalia:   NOT DONE  Rectal:   NOT DONE  Extremities:   Extremities - normal, Has Cane - no, Clubbing - no, Edema - no  Pulses:   2+ and symmetric all extremities  Skin:   Stigmata of Connective Tissue Disease - no  Lymph nodes:   Cervical, supraclavicular, and axillary nodes normal  Psychiatric:  Neurologic:   Pleasant - yes, Anxious - no, Flat affect - no  CAm-ICU - neg, Alert and Oriented x 3 - yes, Moves all 4s - yes, Speech - normal, Cognition - intact           Assessment:       ICD-10-CM   1. Chronic cough  R05 Ambulatory Referral to Neuro Rehab  2. Irritable larynx  J38.7 Ambulatory Referral to Neuro Rehab       Plan:     Patient Instructions     ICD-10-CM   1. Chronic cough  R05   2. 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 Garald Balding (neuro rehab)  -Overall try to cut down your voice output otherwise talk less by 50% -Take 2-3 days where you are in complete silence without any talking or whispering -Anytime you have the urge to cough or clear your throat try sugarless lozenge or drink cold water - Take gabapentin 362m once daily x 7 days, then  3082mtwice daily x 7 days, then 3008mhree times daily to continue. If this makes you too sleepy or drowsy call us [place] we will cut your medication dosing down -Continue Flonase, Singulair and Xyzal as before  Followup 8 weeks with Dr. RamChase Caller report progress return sooner if needed  -RSI cough score at follow-up    SIGNATURE    Dr. MurBrand Males.D., F.C.C.P,  Pulmonary and Critical Care Medicine Staff  Physician, [place] Director - Interstitial Lung Disease  Program  [facility] [place] [facility] [place], [place], [address]  Pager: [phone], If no answer or between  15:00h - 7:00h: call [phone] Telephone: [phone]  1:49 PM 10/23/2019

## 2019-10-25 ENCOUNTER — Encounter: Payer: Self-pay | Admitting: Family Medicine

## 2019-10-25 MED ORDER — GABAPENTIN 300 MG PO CAPS
ORAL_CAPSULE | ORAL | 1 refills | Status: DC
Start: 2019-10-25 — End: 2020-02-11

## 2019-11-05 ENCOUNTER — Other Ambulatory Visit: Payer: Self-pay

## 2019-11-05 ENCOUNTER — Ambulatory Visit (INDEPENDENT_AMBULATORY_CARE_PROVIDER_SITE_OTHER): Payer: Medicare PPO | Admitting: Family Medicine

## 2019-11-05 ENCOUNTER — Encounter: Payer: Self-pay | Admitting: Family Medicine

## 2019-11-05 VITALS — BP 130/80 | HR 55 | Temp 98.2°F | Resp 18 | Ht 62.5 in | Wt 116.4 lb

## 2019-11-05 DIAGNOSIS — E785 Hyperlipidemia, unspecified: Secondary | ICD-10-CM

## 2019-11-05 DIAGNOSIS — Z0000 Encounter for general adult medical examination without abnormal findings: Secondary | ICD-10-CM

## 2019-11-05 DIAGNOSIS — I10 Essential (primary) hypertension: Secondary | ICD-10-CM

## 2019-11-05 DIAGNOSIS — Z23 Encounter for immunization: Secondary | ICD-10-CM | POA: Diagnosis not present

## 2019-11-05 MED ORDER — SPIRONOLACTONE 25 MG PO TABS: 25.0000 mg | ORAL_TABLET | Freq: Every day | ORAL | 3 refills | Status: DC

## 2019-11-05 MED ORDER — VALSARTAN 160 MG PO TABS: 160.0000 mg | ORAL_TABLET | Freq: Every day | ORAL | 3 refills | Status: DC

## 2019-11-05 NOTE — Patient Instructions (Signed)
Preventive Care 66 Years and Older, Female Preventive care refers to lifestyle choices and visits with your health care provider that can promote health and wellness. This includes:  A yearly physical exam. This is also called an annual well check.  Regular dental and eye exams.  Immunizations.  Screening for certain conditions.  Healthy lifestyle choices, such as diet and exercise. What can I expect for my preventive care visit? Physical exam Your health care provider will check:  Height and weight. These may be used to calculate body mass index (BMI), which is a measurement that tells if you are at a healthy weight.  Heart rate and blood pressure.  Your skin for abnormal spots. Counseling Your health care provider may ask you questions about:  Alcohol, tobacco, and drug use.  Emotional well-being.  Home and relationship well-being.  Sexual activity.  Eating habits.  History of falls.  Memory and ability to understand (cognition).  Work and work Statistician.  Pregnancy and menstrual history. What immunizations do I need?  Influenza (flu) vaccine  This is recommended every year. Tetanus, diphtheria, and pertussis (Tdap) vaccine  You may need a Td booster every 10 years. Varicella (chickenpox) vaccine  You may need this vaccine if you have not already been vaccinated. Zoster (shingles) vaccine  You may need this after age 33. Pneumococcal conjugate (PCV13) vaccine  One dose is recommended after age 33. Pneumococcal polysaccharide (PPSV23) vaccine  One dose is recommended after age 72. Measles, mumps, and rubella (MMR) vaccine  You may need at least one dose of MMR if you were born in 1957 or later. You may also need a second dose. Meningococcal conjugate (MenACWY) vaccine  You may need this if you have certain conditions. Hepatitis A vaccine  You may need this if you have certain conditions or if you travel or work in places where you may be exposed  to hepatitis A. Hepatitis B vaccine  You may need this if you have certain conditions or if you travel or work in places where you may be exposed to hepatitis B. Haemophilus influenzae type b (Hib) vaccine  You may need this if you have certain conditions. You may receive vaccines as individual doses or as more than one vaccine together in one shot (combination vaccines). Talk with your health care provider about the risks and benefits of combination vaccines. What tests do I need? Blood tests  Lipid and cholesterol levels. These may be checked every 5 years, or more frequently depending on your overall health.  Hepatitis C test.  Hepatitis B test. Screening  Lung cancer screening. You may have this screening every year starting at age 39 if you have a 30-pack-year history of smoking and currently smoke or have quit within the past 15 years.  Colorectal cancer screening. All adults should have this screening starting at age 36 and continuing until age 15. Your health care provider may recommend screening at age 23 if you are at increased risk. You will have tests every 1-10 years, depending on your results and the type of screening test.  Diabetes screening. This is done by checking your blood sugar (glucose) after you have not eaten for a while (fasting). You may have this done every 1-3 years.  Mammogram. This may be done every 1-2 years. Talk with your health care provider about how often you should have regular mammograms.  BRCA-related cancer screening. This may be done if you have a family history of breast, ovarian, tubal, or peritoneal cancers.  Other tests  Sexually transmitted disease (STD) testing.  Bone density scan. This is done to screen for osteoporosis. You may have this done starting at age 44. Follow these instructions at home: Eating and drinking  Eat a diet that includes fresh fruits and vegetables, whole grains, lean protein, and low-fat dairy products. Limit  your intake of foods with high amounts of sugar, saturated fats, and salt.  Take vitamin and mineral supplements as recommended by your health care provider.  Do not drink alcohol if your health care provider tells you not to drink.  If you drink alcohol: ? Limit how much you have to 0-1 drink a day. ? Be aware of how much alcohol is in your drink. In the [place] one drink equals one 12 oz bottle of beer (355 mL), one 5 oz glass of wine (148 mL), or one 1 oz glass of hard liquor (44 mL). Lifestyle  Take daily care of your teeth and gums.  Stay active. Exercise for at least 30 minutes on 5 or more days each week.  Do not use any products that contain nicotine or tobacco, such as cigarettes, e-cigarettes, and chewing tobacco. If you need help quitting, ask your health care provider.  If you are sexually active, practice safe sex. Use a condom or other form of protection in order to prevent STIs (sexually transmitted infections).  Talk with your health care provider about taking a low-dose aspirin or statin. What's next?  Go to your health care provider once a year for a well check visit.  Ask your health care provider how often you should have your eyes and teeth checked.  Stay up to date on all vaccines. This information is not intended to replace advice given to you by your health care provider. Make sure you discuss any questions you have with your health care provider. Document Revised: 02/02/2018 Document Reviewed: 02/02/2018 Elsevier Patient Education  2020 [employer].

## 2019-11-05 NOTE — Progress Notes (Signed)
Subjective:     [name] is a 64 y.o. female and is here for a comprehensive physical exam. The patient reports no problems. She also needs f/u bp and chol and needs refills/ labs.    Social History   Socioeconomic History  . Marital status: Married    Spouse name: Not on file  . Number of children: 3  . Years of education: Not on file  . Highest education level: Not on file  Occupational History  . 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 Smoking status: Former Smoker    Packs/day: 1.00    Years: 14.00    Pack years: 14.00    Types: Cigarettes    Quit date: 07/11/1982    Years since quitting: 37.3  . Smokeless tobacco: Never Used  Vaping Use  . Vaping Use: Never used  Substance and Sexual Activity  . Alcohol use: Yes    Alcohol/week: 0.0 standard drinks    Comment: rare  . Drug use: No  . Sexual activity: Yes    Partners: Male    Birth control/protection: Post-menopausal    Comment: 1st intercourse 64 yo-Fewer than 5 partners  Other Topics Concern  . Not on file  Social History Narrative   Exercise-- jazzersize  3x a week    Social Determinants of Health   Financial Resource Strain:   . Difficulty of Paying Living Expenses: Not on file  Food Insecurity:   . Worried About Charity fundraiser in the Last Year: Not on file  . Ran Out of Food in the Last Year: Not on file  Transportation Needs:   . Lack of Transportation (Medical): Not on file  . Lack of Transportation (Non-Medical): Not on file  Physical Activity:   . Days of Exercise per Week: Not on file  . Minutes of Exercise per Session: Not on file  Stress:   . Feeling of Stress : Not on file  Social Connections:   . Frequency of Communication with Friends and Family: Not on file  . Frequency of Social Gatherings with Friends and Family: Not on file  . Attends Religious Services: Not on file  . Active Member of Clubs or Organizations: Not on  file  . Attends Archivist Meetings: Not on file  . Marital Status: Not on file  Intimate Partner Violence:   . Fear of Current or Ex-Partner: Not on file  . Emotionally Abused: Not on file  . Physically Abused: Not on file  . Sexually Abused: Not on file   Health Maintenance  Topic Date Due  . TETANUS/TDAP  04/18/2018  . INFLUENZA VACCINE  09/23/2019  . PNA vac Low Risk Adult (2 of 2 - PPSV23) 11/03/2019  . MAMMOGRAM  07/17/2020  . DEXA SCAN  07/17/2021  . COLONOSCOPY  10/27/2027  . COVID-19 Vaccine  Completed  . 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 She does not have any pertinent problems on file. She  has a past surgical history that includes Chest tube insertion (2005); Video assisted thoracoscopy (2005); Cesarean section; spirometry (  01/20/2015); Colonoscopy; Wisdom tooth extraction; Video bronchoscopy (N/A, 08/06/2019); and Bronchial washings (08/06/2019). Her family history includes Cancer (age of onset: 94) in her maternal grandmother; Diabetes in her maternal grandmother; Heart disease in her father; Hypertension in her father and mother; Stroke in her mother. She  reports that she quit smoking about 37 years ago. Her smoking use included cigarettes. She has a 14.00 pack-year smoking history. She has never used smokeless tobacco. She reports current alcohol use. She reports that she does not use drugs. She has a current medication list  which includes the following prescription(s): acetaminophen, albuterol, atorvastatin, azelastine, calcium carb-cholecalciferol, fluticasone, gabapentin, gabapentin, levocetirizine, montelukast, spironolactone, and valsartan, and the following Facility-Administered Medications: gi cocktail. Current Outpatient Medications on File Prior to Visit  Medication Sig Dispense Refill  . acetaminophen (TYLENOL) 500 MG tablet Take 500 mg by mouth every 6 (six) hours as needed for moderate pain or headache.    . albuterol (VENTOLIN HFA) 108 (90 Base) MCG/ACT inhaler Inhale 1-2 puffs into the lungs every 6 (six) hours as needed for wheezing or shortness of breath.    Marland Kitchen atorvastatin (LIPITOR) 20 MG tablet Take 1 tablet (20 mg total) by mouth daily. 90 tablet 3  . azelastine (ASTELIN) 0.1 % nasal spray Place 1 spray into both nostrils 2 (two) times daily.  3  . Calcium Carb-Cholecalciferol (CALCIUM 600 + D PO) Take 1 tablet by mouth daily.    . fluticasone (FLONASE) 50 MCG/ACT nasal spray Place 1 spray into both nostrils daily.    Marland Kitchen gabapentin (NEURONTIN) 300 MG capsule Take 1 capsule (300 mg total) by mouth 3 (three) times daily. Take 300mg  daily once daily for 7 days, then 300mg  twice daily for 7 days then 300mg  3 times daily to continue 21 capsule 3  . gabapentin (NEURONTIN) 300 MG capsule 1 daily x 7 days then 1 2 x daily x 7 days then 1 3 x daily thereafter 120 capsule 1  . levocetirizine (XYZAL) 5 MG tablet Take 5 mg by mouth every evening.     . montelukast (SINGULAIR) 10 MG tablet Take 10 mg by mouth at bedtime.     Current Facility-Administered Medications on File Prior to Visit  Medication Dose Route Frequency Provider Last Rate Last Admin  . gi cocktail (Maalox,Lidocaine,Donnatal)  30 mL Oral Once Roma Schanz R, DO       She has No Known Allergies..  Review of Systems Review of Systems  Constitutional: Negative for activity change, appetite change and fatigue.  HENT: Negative for hearing  loss, congestion, tinnitus and ear discharge.  dentist q36m Eyes: Negative for visual disturbance (see optho q1y -- vision corrected to 20/20 with glasses).  Respiratory: Negative for cough, chest tightness and shortness of breath.   Cardiovascular: Negative for chest pain, palpitations and leg swelling.  Gastrointestinal: Negative for abdominal pain, diarrhea, constipation and abdominal distention.  Genitourinary: Negative for urgency, frequency, decreased urine volume and difficulty urinating.  Musculoskeletal: Negative for back pain, arthralgias and gait problem.  Skin: Negative for color change, pallor and rash.  Neurological: Negative for dizziness, light-headedness, numbness and headaches.  Hematological: Negative for adenopathy. Does not bruise/bleed easily.  Psychiatric/Behavioral: Negative for suicidal ideas, confusion, sleep disturbance, self-injury, dysphoric mood, decreased concentration and agitation.      Objective:    BP 130/80 (BP Location: Right Arm, Patient Position: Sitting, Cuff Size: Normal)   Pulse (!) 55   Temp 98.2 F (36.8 C) (Oral)   Resp 18   Ht  5' 2.5" (1.588 m)   Wt 116 lb 6.4 oz (52.8 kg)   SpO2 100%   BMI 20.95 kg/m  General appearance: alert, cooperative, appears stated age and no distress Head: Normocephalic, without obvious abnormality, atraumatic Eyes: negative findings: lids and lashes normal, conjunctivae and sclerae normal and pupils equal, round, reactive to light and accomodation Ears: normal TM's and external ear canals both ears Neck: no adenopathy, no carotid bruit, no JVD, supple, symmetrical, trachea midline and thyroid not enlarged, symmetric, no tenderness/mass/nodules Back: symmetric, no curvature. ROM normal. No CVA tenderness. Lungs: clear to auscultation bilaterally Breasts: normal appearance, no masses or tenderness Heart: regular rate and rhythm, S1, S2 normal, no murmur, click, rub or gallop Abdomen: soft, non-tender; bowel  sounds normal; no masses,  no organomegaly Pelvic: not indicated; post-menopausal, no abnormal Pap smears in past Extremities: extremities normal, atraumatic, no cyanosis or edema Pulses: 2+ and symmetric Skin: Skin color, texture, turgor normal. No rashes or lesions Lymph nodes: Cervical, supraclavicular, and axillary nodes normal. Neurologic: Alert and oriented X 3, normal strength and tone. Normal symmetric reflexes. Normal coordination and gait    Assessment:    Healthy female exam.      Plan:    check labs  ghm utd  See After Visit Summary for Counseling Recommendations    1. Essential hypertension Well controlled, no changes to meds. Encouraged heart healthy diet such as the DASH diet and exercise as tolerated.  - valsartan (DIOVAN) 160 MG tablet; Take 1 tablet (160 mg total) by mouth daily.  Dispense: 90 tablet; Refill: 3 - spironolactone (ALDACTONE) 25 MG tablet; Take 1 tablet (25 mg total) by mouth daily.  Dispense: 90 tablet; Refill: 3 - Lipid panel - Comprehensive metabolic panel  2. Dyslipidemia Encouraged heart healthy diet, increase exercise, avoid trans fats, consider a krill oil cap daily - Lipid panel - Comprehensive metabolic panel  3. Need for pneumococcal vaccination  - Pneumococcal polysaccharide vaccine 23-valent greater than or equal to 2yo subcutaneous/IM  4. Preventative health care See above

## 2019-11-06 ENCOUNTER — Encounter: Payer: Self-pay | Admitting: Family Medicine

## 2019-11-06 LAB — LIPID PANEL
Cholesterol: 140 mg/dL (ref <200)
HDL: 64 mg/dL (ref >=50)
LDL Cholesterol (Calc): 62 mg/dL (calc)
Non-HDL Cholesterol (Calc): 76 mg/dL (calc) (ref <130)
Total CHOL/HDL Ratio: 2.2 (calc) (ref <5.0)
Triglycerides: 65 mg/dL (ref <150)

## 2019-11-06 LAB — COMPREHENSIVE METABOLIC PANEL
AG Ratio: 2.1 (calc) (ref 1.0–2.5)
ALT: 18 U/L (ref 6–29)
AST: 16 U/L (ref 10–35)
Albumin: 4.4 g/dL (ref 3.6–5.1)
Alkaline phosphatase (APISO): 101 U/L (ref 37–153)
BUN: 9 mg/dL (ref 7–25)
CO2: 28 mmol/L (ref 20–32)
Calcium: 8.8 mg/dL (ref 8.6–10.4)
Chloride: 100 mmol/L (ref 98–110)
Creat: 0.53 mg/dL (ref 0.50–0.99)
Globulin: 2.1 g/dL (calc) (ref 1.9–3.7)
Glucose, Bld: 95 mg/dL (ref 65–99)
Potassium: 3.7 mmol/L (ref 3.5–5.3)
Sodium: 137 mmol/L (ref 135–146)
Total Bilirubin: 0.6 mg/dL (ref 0.2–1.2)
Total Protein: 6.5 g/dL (ref 6.1–8.1)

## 2019-11-20 ENCOUNTER — Encounter: Payer: Self-pay | Admitting: Family Medicine

## 2019-11-21 NOTE — Telephone Encounter (Signed)
Have we looked into the patient's insurance for the Prolia?

## 2019-11-23 ENCOUNTER — Encounter: Payer: Self-pay | Admitting: Family Medicine

## 2019-12-11 ENCOUNTER — Emergency Department (HOSPITAL_COMMUNITY): Payer: Medicare PPO

## 2019-12-11 ENCOUNTER — Emergency Department (HOSPITAL_COMMUNITY)
Admission: EM | Admit: 2019-12-11 | Discharge: 2019-12-11 | Disposition: A | Discharge status: Home / Self Care | Payer: Medicare PPO | Attending: Emergency Medicine | Admitting: Emergency Medicine

## 2019-12-11 ENCOUNTER — Other Ambulatory Visit: Payer: Self-pay

## 2019-12-11 ENCOUNTER — Encounter (HOSPITAL_COMMUNITY): Payer: Self-pay | Admitting: Emergency Medicine

## 2019-12-11 DIAGNOSIS — R202 Paresthesia of skin: Secondary | ICD-10-CM | POA: Diagnosis not present

## 2019-12-11 DIAGNOSIS — R6884 Jaw pain: Secondary | ICD-10-CM | POA: Diagnosis not present

## 2019-12-11 DIAGNOSIS — I2510 Atherosclerotic heart disease of native coronary artery without angina pectoris: Secondary | ICD-10-CM

## 2019-12-11 DIAGNOSIS — I10 Essential (primary) hypertension: Secondary | ICD-10-CM | POA: Insufficient documentation

## 2019-12-11 DIAGNOSIS — J90 Pleural effusion, not elsewhere classified: Secondary | ICD-10-CM | POA: Diagnosis not present

## 2019-12-11 DIAGNOSIS — Z87891 Personal history of nicotine dependence: Secondary | ICD-10-CM | POA: Diagnosis not present

## 2019-12-11 DIAGNOSIS — Z79899 Other long term (current) drug therapy: Secondary | ICD-10-CM | POA: Insufficient documentation

## 2019-12-11 DIAGNOSIS — R079 Chest pain, unspecified: Secondary | ICD-10-CM | POA: Diagnosis not present

## 2019-12-11 LAB — BASIC METABOLIC PANEL
Anion gap: 11 (ref 5–15)
BUN: 8 mg/dL (ref 8–23)
CO2: 26 mmol/L (ref 22–32)
Calcium: 9.2 mg/dL (ref 8.9–10.3)
Chloride: 100 mmol/L (ref 98–111)
Creatinine, Ser: 0.58 mg/dL (ref 0.44–1.00)
GFR, Estimated: >60 mL/min (ref >60)
Glucose, Bld: 147 mg/dL — ABNORMAL HIGH (ref 70–99)
Potassium: 3.4 mmol/L — ABNORMAL LOW (ref 3.5–5.1)
Sodium: 137 mmol/L (ref 135–145)

## 2019-12-11 LAB — CBC
HCT: 36.5 % (ref 36.0–46.0)
Hemoglobin: 12.4 g/dL (ref 12.0–15.0)
MCH: 30.2 pg (ref 26.0–34.0)
MCHC: 34 g/dL (ref 30.0–36.0)
MCV: 89 fL (ref 80.0–100.0)
Platelets: 191 10*3/uL (ref 150–400)
RBC: 4.1 MIL/uL (ref 3.87–5.11)
RDW: 11.9 % (ref 11.5–15.5)
WBC: 4.9 10*3/uL (ref 4.0–10.5)
nRBC: 0 % (ref 0.0–0.2)

## 2019-12-11 LAB — TROPONIN I (HIGH SENSITIVITY): Troponin I (High Sensitivity): 3 ng/L (ref <18)

## 2019-12-11 MED ORDER — NAPROXEN 500 MG PO TABS
500.0000 mg | ORAL_TABLET | Freq: Two times a day (BID) | ORAL | 0 refills | Status: AC
Start: 2019-12-11 — End: 2019-12-18

## 2019-12-11 NOTE — ED Triage Notes (Signed)
Patient arrives to ED with complaints of right arm tingling and right sided jaw pain since Saturday. Pt states the pain is constant and throbbing. Denies chest pain and SOB. States was not able to sleep last night.

## 2019-12-11 NOTE — ED Provider Notes (Signed)
[facility] EMERGENCY DEPARTMENT Provider Note   CSN: [id number] Arrival date & time: 12/11/19  0809     History Chief Complaint  Patient presents with  . Tingling  . Jaw Pain    [name] is a 64 y.o. female.  HPI   64 year old female with a history of allergies, atherosclerosis, costochondritis, fibroids, hypertension, Bell's palsy, pneumothorax, who presents to the emergency department today for evaluation of jaw pain and right arm tingling.  States that for the last 2 days she has had a throbbing sensation to the right side of her jaw.  She has intermittent fleeting pain that radiates into the right side of her neck but does not have that currently.  She also has some right arm tingling that she states seems to be worse at night.  She states is located from the elbow down and is not necessarily involve her fingers.  She denies any weakness to the right upper extremity.  Denies any weakness to the right lower extremity and denies any other associated neurologic complaints.  She denies any chest pain, shortness of breath.  She denies any new vision changes though does have some chronic changes to the vision in the right eye due to history of cataracts.  Denies any headaches. Denies dental pain or any teeth grinding.   Past Medical History:  Diagnosis Date  . Allergy   . Atherosclerosis 07/08/2019  . Cataract   . Chronic cough 01/24/2019  . Costochondritis 01/24/2019  . Epigastric pain 07/29/2016  . FIBROIDS, UTERUS 01/18/2007   Qualifier: Diagnosis of  By: Jerold Coombe    . H/O Bell's palsy 05/2010  . Hypertension   . HYPERTENSION 03/24/2007   Qualifier: Diagnosis of  By: Ronnald Ramp CMA, [place]    . MENORRHAGIA, PERIMENOPAUSAL 01/13/2007   Qualifier: Diagnosis of  By: Jerold Coombe    . MOLE 05/07/2010   Qualifier: Diagnosis of  By: Jerold Coombe    . Osteopenia 08/23/2014  . RUQ pain 07/29/2016  . Spontaneous pneumothorax 2005   R, s/p tube, then VATS   . Spontaneous tension pneumothorax 03/24/2007   Qualifier: Diagnosis of  By: Jerold Coombe      Patient Active Problem List   Diagnosis Date Noted  . Bronchiectasis without complication ([place])   . Atherosclerosis 07/08/2019  . Costochondritis 01/24/2019  . Chronic cough 01/24/2019  . History of cigarette smoking 01/24/2019  . RUQ pain 07/29/2016  . Epigastric pain 07/29/2016  . Osteopenia 08/23/2014  . MOLE 05/07/2010  . MAMMOGRAM, ABNORMAL 04/18/2008  . Essential hypertension 03/24/2007  . SPONTANEOUS TENSION PNEUMOTHORAX 03/24/2007  . FIBROIDS, UTERUS 01/18/2007  . MENORRHAGIA, PERIMENOPAUSAL 01/13/2007    Past Surgical History:  Procedure Laterality Date  . BRONCHIAL WASHINGS  08/06/2019   Procedure: BRONCHIAL lavage;  Surgeon: Brand Males, MD;  Location: [facility];  Service: Endoscopy;;  . CESAREAN SECTION    . CHEST TUBE INSERTION  2005   spontaneus Pneumothorax  . COLONOSCOPY    . SPIROMETRY  01/20/2015   Dr. Donneta Romberg  . VIDEO ASSISTED THORACOSCOPY  2005  . VIDEO BRONCHOSCOPY N/A 08/06/2019   Procedure: VIDEO BRONCHOSCOPY WITHOUT FLUORO;  Surgeon: Brand Males, MD;  Location: [facility];  Service: Endoscopy;  Laterality: N/A;  . WISDOM TOOTH EXTRACTION       OB History    Gravida  4   Para  3   Term      Preterm      AB  1   Living  3     SAB      TAB      Ectopic      Multiple      Live Births              Family History  Problem Relation Age of Onset  . Hypertension Mother   . Stroke Mother   . Heart disease Father   . Hypertension Father   . Cancer Maternal Grandmother 72       breast  . Diabetes Maternal Grandmother   . Colon cancer Neg Hx   . Esophageal cancer Neg Hx   . Rectal cancer Neg Hx   . Stomach cancer Neg Hx     Social History   Tobacco Use  . Smoking status: Former Smoker    Packs/day: 1.00    Years: 14.00    Pack years: 14.00    Types: Cigarettes    Quit date: 07/11/1982    Years since  quitting: 37.4  . Smokeless tobacco: Never Used  Vaping Use  . Vaping Use: Never used  Substance Use Topics  . Alcohol use: Yes    Alcohol/week: 0.0 standard drinks    Comment: rare  . Drug use: No    Home Medications Prior to Admission medications   Medication Sig Start Date End Date Taking? Authorizing Provider  acetaminophen (TYLENOL) 500 MG tablet Take 500 mg by mouth every 6 (six) hours as needed for moderate pain or headache.    [provider]  albuterol (VENTOLIN HFA) 108 (90 Base) MCG/ACT inhaler Inhale 1-2 puffs into the lungs every 6 (six) hours as needed for wheezing or shortness of breath.    [provider]  atorvastatin (LIPITOR) 20 MG tablet Take 1 tablet (20 mg total) by mouth daily. 09/13/19   Sueanne Margarita, MD  azelastine (ASTELIN) 0.1 % nasal spray Place 1 spray into both nostrils 2 (two) times daily. 10/03/17   [provider]  Calcium Carb-Cholecalciferol (CALCIUM 600 + D PO) Take 1 tablet by mouth daily.    [provider]  fluticasone (FLONASE) 50 MCG/ACT nasal spray Place 1 spray into both nostrils daily.    [provider]  gabapentin (NEURONTIN) 300 MG capsule Take 1 capsule (300 mg total) by mouth 3 (three) times daily. Take 300mg  daily once daily for 7 days, then 300mg  twice daily for 7 days then 300mg  3 times daily to continue 10/23/19   Brand Males, MD  gabapentin (NEURONTIN) 300 MG capsule 1 daily x 7 days then 1 2 x daily x 7 days then 1 3 x daily thereafter 10/25/19   Brand Males, MD  levocetirizine (XYZAL) 5 MG tablet Take 5 mg by mouth every evening.  12/27/17   [provider]  montelukast (SINGULAIR) 10 MG tablet Take 10 mg by mouth at bedtime.    [provider]  naproxen (NAPROSYN) 500 MG tablet Take 1 tablet (500 mg total) by mouth 2 (two) times daily for 7 days. 12/11/19 12/18/19  Sadiya Durand S, PA-C  spironolactone (ALDACTONE) 25 MG tablet Take 1 tablet (25 mg total) by mouth  daily. 11/05/19   Ann Held, DO  valsartan (DIOVAN) 160 MG tablet Take 1 tablet (160 mg total) by mouth daily. 11/05/19   Ann Held, DO    Allergies    Patient has no known allergies.  Review of Systems   Review of Systems  Constitutional: Negative for fever.  HENT: Negative for dental problem, ear pain and sore throat.        Right jaw pain  Eyes: Negative for pain and visual disturbance.  Respiratory: Negative for cough and shortness of breath.   Cardiovascular: Negative for chest pain.  Gastrointestinal: Negative for abdominal pain, diarrhea, nausea and vomiting.  Genitourinary: Negative for dysuria and hematuria.  Musculoskeletal: Positive for neck pain (intermittent, none currently). Negative for back pain.  Skin: Negative for rash.  Neurological: Negative for dizziness, speech difficulty, weakness, light-headedness, numbness and headaches.       Paresthesias to the RUE  All other systems reviewed and are negative.   Physical Exam Updated Vital Signs BP (!) 146/88 (BP Location: Left Arm)   Pulse 61   Temp 98 F (36.7 C) (Oral)   Resp 16   Ht 5\' 3"  (1.6 m)   Wt 51.7 kg   SpO2 100%   BMI 20.19 kg/m   Physical Exam Vitals and nursing note reviewed.  Constitutional:      General: She is not in acute distress.    Appearance: She is well-developed.  HENT:     Head: Normocephalic and atraumatic.     Mouth/Throat:     Comments: TTP over the right masseter, no significant TTP over the TMJ region.  No erythema, induration, warmth.  No TTP to the teeth in the right lower mouth. Eyes:     Extraocular Movements: Extraocular movements intact.     Conjunctiva/sclera: Conjunctivae normal.     Pupils: Pupils are equal, round, and reactive to light.  Neck:     Comments: No carotid bruit bilaterally Cardiovascular:     Rate and Rhythm: Normal rate and regular rhythm.     Heart sounds: Normal heart sounds. No murmur heard.   Pulmonary:     Effort:  Pulmonary effort is normal. No respiratory distress.     Breath sounds: Normal breath sounds. No wheezing, rhonchi or rales.  Abdominal:     General: Abdomen is flat.  Musculoskeletal:     Cervical back: Normal range of motion and neck supple.  Skin:    General: Skin is warm and dry.  Neurological:     Mental Status: She is alert.     Comments: Mental Status:  Alert, thought content appropriate, able to give a coherent history. Speech fluent without evidence of aphasia. Able to follow 2 step commands without difficulty.  Cranial Nerves:  II:  pupils equal, round, reactive to light III,IV, VI: ptosis not present, extra-ocular motions intact bilaterally  V,VII: smile symmetric, facial light touch sensation equal VIII: hearing grossly normal to voice  X: uvula elevates symmetrically  XI: bilateral shoulder shrug symmetric and strong XII: midline tongue extension without fassiculations Motor:  Normal tone. 5/5 strength of BUE and BLE major muscle groups including strong and equal grip strength and dorsiflexion/plantar flexion Sensory: light touch normal in all extremities.  CV: 2+ radial/ulnar pulses      ED Results / Procedures / Treatments   Labs (all labs ordered are listed, but only abnormal results are displayed) Labs Reviewed  BASIC METABOLIC PANEL - Abnormal; Notable for the following components:      Result Value   Potassium 3.4 (*)    Glucose, Bld 147 (*)    All other components within normal limits  CBC  TROPONIN I (HIGH SENSITIVITY)    EKG EKG Interpretation  Date/Time:  Tuesday December 11 2019 08:13:14 EDT Ventricular Rate:  72 PR Interval:  140 QRS Duration:  126 QT Interval:  412 QTC Calculation: 451 R Axis:   77 Text Interpretation: Normal sinus rhythm Right bundle branch block Abnormal ECG Confirmed by Gerlene Fee [phone]) on 12/11/2019 5:42:35 PM   Radiology DG Chest 2 View  Result Date: 12/11/2019 CLINICAL DATA:  Chest pain EXAM: CHEST - 2 VIEW  COMPARISON:  08/06/2019 FINDINGS: Right apical lung resection staple line noted. The lungs are symmetrically expanded and are clear. No pneumothorax or pleural effusion. Cardiac size within normal limits. Pulmonary vascularity is normal. No acute bone abnormality. IMPRESSION: No active cardiopulmonary disease. Electronically Signed   By: Fidela Salisbury MD   On: 12/11/2019 08:52    Procedures Procedures (including critical care time)  Medications Ordered in ED Medications - No data to display  ED Course  I have reviewed the triage vital signs and the nursing notes.  Pertinent labs & imaging results that were available during my care of the patient were reviewed by me and considered in my medical decision making (see chart for details).    MDM Rules/Calculators/A&P                          64 year old female presenting for evaluation of right jaw pain and right arm tingling.   Reviewed/interpreted labs Cbc unremarkable Bmp with mild hypokalemia Trop ekg  EKG with Normal sinus rhythm Right bundle branch block Abnormal ECG   CXR -   No active cardiopulmonary disease.   On exam she does have tenderness over the right masseter.  There is no erythema, induration or signs of infection.  She does not have any tenderness to her.  Her neurologic exam is completely benign.  I have very low suspicion for acute CVA or emergent neurologic problem at this time.  With regard to the pain in her jaw, she does have tenderness along the masseter, unclear cause, could be related to TMJ dysfunction.  Again low suspicion for emergent process at this time, no signs of infection.  Will treat with anti-inflammatories and have her f/u with her PCP for further eval if symptoms persist. Advised on strict return precautions. She voices understanding of the plan and reasons to return. All questions answered, pt stable for d/c.   Final Clinical Impression(s) / ED Diagnoses Final diagnoses:  Paresthesia  Jaw pain     Rx / DC Orders ED Discharge Orders         Ordered    naproxen (NAPROSYN) 500 MG tablet  2 times daily        12/11/19 1819           Aloysious Vangieson, [employer], PA-C 12/11/19 2347    Maudie Flakes, MD 12/12/19 2242

## 2019-12-11 NOTE — Discharge Instructions (Signed)
You may alternate taking Tylenol and Naproxen as needed for pain control. You may take Naproxen twice daily as directed on your discharge paperwork and you may take  [phone] mg of Tylenol every 6 hours. Do not exceed 4000 mg of Tylenol daily as this can lead to liver damage. Also, make sure to take Naproxen with meals as it can cause an upset stomach. Do not take other NSAIDs while taking Naproxen such as (Aleve, Ibuprofen, Aspirin, Celebrex, etc) and do not take more than the prescribed dose as this can lead to ulcers and bleeding in your GI tract. You may use warm and cold compresses to help with your symptoms.   Please follow up with your primary doctor within the next 7-10 days for re-evaluation and further treatment of your symptoms.   Please return to the ER sooner if you have any new or worsening symptoms.

## 2019-12-13 MED ORDER — METOPROLOL TARTRATE 100 MG PO TABS: ORAL_TABLET | ORAL | 0 refills | Status: DC

## 2019-12-20 ENCOUNTER — Other Ambulatory Visit: Payer: Self-pay

## 2019-12-20 ENCOUNTER — Encounter: Payer: Self-pay | Admitting: Family Medicine

## 2019-12-20 ENCOUNTER — Ambulatory Visit: Payer: Medicare PPO | Admitting: Family Medicine

## 2019-12-20 VITALS — BP 120/78 | HR 79 | Temp 98.4°F | Resp 18 | Ht 63.0 in | Wt 116.2 lb

## 2019-12-20 DIAGNOSIS — I10 Essential (primary) hypertension: Secondary | ICD-10-CM | POA: Diagnosis not present

## 2019-12-20 DIAGNOSIS — R6884 Jaw pain: Secondary | ICD-10-CM

## 2019-12-20 NOTE — Progress Notes (Signed)
Patient ID: [name], female    DOB: April 24, 1955  Age: 64 y.o. MRN: [id number]    Subjective:  Subjective  HPI MALINI FLEMINGS presents for f/u er from R sided jaw pain and numbness ----- er w/u was neg and symptoms have resolved   Review of Systems  Constitutional: Negative for appetite change, diaphoresis, fatigue and unexpected weight change.  Eyes: Negative for pain, redness and visual disturbance.  Respiratory: Negative for cough, chest tightness, shortness of breath and wheezing.   Cardiovascular: Negative for chest pain, palpitations and leg swelling.  Endocrine: Negative for cold intolerance, heat intolerance, polydipsia, polyphagia and polyuria.  Genitourinary: Negative for difficulty urinating, dysuria and frequency.  Neurological: Negative for dizziness, light-headedness, numbness and headaches.    History Past Medical History:  Diagnosis Date  . Allergy   . Atherosclerosis 07/08/2019  . Cataract   . Chronic cough 01/24/2019  . Costochondritis 01/24/2019  . Epigastric pain 07/29/2016  . FIBROIDS, UTERUS 01/18/2007   Qualifier: Diagnosis of  By: Jerold Coombe    . H/O Bell's palsy 05/2010  . Hypertension   . HYPERTENSION 03/24/2007   Qualifier: Diagnosis of  By: Ronnald Ramp CMA, Chemira    . MENORRHAGIA, PERIMENOPAUSAL 01/13/2007   Qualifier: Diagnosis of  By: Jerold Coombe    . MOLE 05/07/2010   Qualifier: Diagnosis of  By: Jerold Coombe    . Osteopenia 08/23/2014  . RUQ pain 07/29/2016  . Spontaneous pneumothorax 2005   R, s/p tube, then VATS  . Spontaneous tension pneumothorax 03/24/2007   Qualifier: Diagnosis of  By: Jerold Coombe      She has a past surgical history that includes Chest tube insertion (2005); Video assisted thoracoscopy (2005); Cesarean section; spirometry (01/20/2015); Colonoscopy; Wisdom tooth extraction; Video bronchoscopy (N/A, 08/06/2019); and Bronchial washings (08/06/2019).   Her family history includes Cancer (age of onset: 60) in her  maternal grandmother; Diabetes in her maternal grandmother; Heart disease in her father; Hypertension in her father and mother; Stroke in her mother.She reports that she quit smoking about 37 years ago. Her smoking use included cigarettes. She has a 14.00 pack-year smoking history. She has never used smokeless tobacco. She reports current alcohol use. She reports that she does not use drugs.  Current Outpatient Medications on File Prior to Visit  Medication Sig Dispense Refill  . acetaminophen (TYLENOL) 500 MG tablet Take 500 mg by mouth every 6 (six) hours as needed for moderate pain or headache.    . albuterol (VENTOLIN HFA) 108 (90 Base) MCG/ACT inhaler Inhale 1-2 puffs into the lungs every 6 (six) hours as needed for wheezing or shortness of breath.    Marland Kitchen atorvastatin (LIPITOR) 20 MG tablet Take 1 tablet (20 mg total) by mouth daily. 90 tablet 3  . azelastine (ASTELIN) 0.1 % nasal spray Place 1 spray into both nostrils 2 (two) times daily.  3  . Calcium Carb-Cholecalciferol (CALCIUM 600 + D PO) Take 1 tablet by mouth daily.    . fluticasone (FLONASE) 50 MCG/ACT nasal spray Place 1 spray into both nostrils daily.    Marland Kitchen gabapentin (NEURONTIN) 300 MG capsule Take 1 capsule (300 mg total) by mouth 3 (three) times daily. Take 300mg  daily once daily for 7 days, then 300mg  twice daily for 7 days then 300mg  3 times daily to continue 21 capsule 3  . gabapentin (NEURONTIN) 300 MG capsule 1 daily x 7 days then 1 2 x daily x 7 days then 1 3 x daily  thereafter 120 capsule 1  . levocetirizine (XYZAL) 5 MG tablet Take 5 mg by mouth every evening.     . metoprolol tartrate (LOPRESSOR) 100 MG tablet Take 1 tablet (100 mg total) two hours prior to CT scan. 1 tablet 0  . montelukast (SINGULAIR) 10 MG tablet Take 10 mg by mouth at bedtime.    Marland Kitchen spironolactone (ALDACTONE) 25 MG tablet Take 1 tablet (25 mg total) by mouth daily. 90 tablet 3  . valsartan (DIOVAN) 160 MG tablet Take 1 tablet (160 mg total) by mouth daily.  90 tablet 3   Current Facility-Administered Medications on File Prior to Visit  Medication Dose Route Frequency Provider Last Rate Last Admin  . gi cocktail (Maalox,Lidocaine,Donnatal)  30 mL Oral Once Carollee Herter, Alferd Apa, DO         Objective:  Objective  Physical Exam Vitals and nursing note reviewed.  Constitutional:      Appearance: She is well-developed.  HENT:     Head: Normocephalic and atraumatic.  Eyes:     Conjunctiva/sclera: Conjunctivae normal.  Neck:     Thyroid: No thyromegaly.     Vascular: No carotid bruit or JVD.  Cardiovascular:     Rate and Rhythm: Normal rate and regular rhythm.     Heart sounds: Normal heart sounds. No murmur heard.   Pulmonary:     Effort: Pulmonary effort is normal. No respiratory distress.     Breath sounds: Normal breath sounds. No wheezing or rales.  Chest:     Chest wall: No tenderness.  Musculoskeletal:     Cervical back: Normal range of motion and neck supple.  Neurological:     Mental Status: She is alert and oriented to person, place, and time.    BP 120/78 (BP Location: Right Arm, Patient Position: Sitting, Cuff Size: Normal)   Pulse 79   Temp 98.4 F (36.9 C) (Oral)   Resp 18   Ht 5\' 3"  (1.6 m)   Wt 116 lb 3.2 oz (52.7 kg)   SpO2 98%   BMI 20.58 kg/m  Wt Readings from Last 3 Encounters:  12/20/19 116 lb 3.2 oz (52.7 kg)  12/11/19 114 lb (51.7 kg)  11/05/19 116 lb 6.4 oz (52.8 kg)     Lab Results  Component Value Date   WBC 4.9 12/11/2019   HGB 12.4 12/11/2019   HCT 36.5 12/11/2019   PLT 191 12/11/2019   GLUCOSE 147 (H) 12/11/2019   CHOL 140 11/05/2019   TRIG 65 11/05/2019   HDL 64 11/05/2019   LDLDIRECT 111.7 05/07/2010   LDLCALC 62 11/05/2019   ALT 18 11/05/2019   AST 16 11/05/2019   NA 137 12/11/2019   K 3.4 (L) 12/11/2019   CL 100 12/11/2019   CREATININE 0.58 12/11/2019   BUN 8 12/11/2019   CO2 26 12/11/2019   TSH 0.50 10/21/2017   MICROALBUR 0.2 02/21/2014    DG Chest 2 View  Result  Date: 12/11/2019 CLINICAL DATA:  Chest pain EXAM: CHEST - 2 VIEW COMPARISON:  08/06/2019 FINDINGS: Right apical lung resection staple line noted. The lungs are symmetrically expanded and are clear. No pneumothorax or pleural effusion. Cardiac size within normal limits. Pulmonary vascularity is normal. No acute bone abnormality. IMPRESSION: No active cardiopulmonary disease. Electronically Signed   By: Fidela Salisbury MD   On: 12/11/2019 08:52     Assessment & Plan:  Plan  I am having [name] A. Koelling maintain her fluticasone, montelukast, azelastine, levocetirizine, albuterol, acetaminophen, Calcium Carb-Cholecalciferol (  CALCIUM 600 + D PO), atorvastatin, gabapentin, gabapentin, valsartan, spironolactone, and metoprolol tartrate. We will continue to administer gi cocktail.  No orders of the defined types were placed in this encounter.   Problem List Items Addressed This Visit    None      Follow-up: Return if symptoms worsen or fail to improve.  Ann Held, DO

## 2019-12-20 NOTE — Patient Instructions (Signed)

## 2019-12-23 DIAGNOSIS — R6884 Jaw pain: Secondary | ICD-10-CM | POA: Insufficient documentation

## 2019-12-23 NOTE — Assessment & Plan Note (Signed)
Symptoms resolved Er visit reviewed--- w/u normal

## 2019-12-23 NOTE — Assessment & Plan Note (Signed)
Well controlled, no changes to meds. Encouraged heart healthy diet such as the DASH diet and exercise as tolerated.  °

## 2019-12-24 ENCOUNTER — Ambulatory Visit
Admission: RE | Admit: 2019-12-24 | Discharge: 2019-12-24 | Disposition: A | Discharge status: Home / Self Care | Payer: Medicare PPO | Source: Ambulatory Visit | Attending: Primary Care | Admitting: Primary Care

## 2019-12-24 DIAGNOSIS — R918 Other nonspecific abnormal finding of lung field: Secondary | ICD-10-CM | POA: Diagnosis not present

## 2019-12-24 DIAGNOSIS — R059 Cough, unspecified: Secondary | ICD-10-CM

## 2019-12-26 NOTE — Progress Notes (Signed)
Please let patient know CT chest showed scattered bronchiectasis bilaterally most prominent in right middle lobe (this means her airway are larger and she is more prone to upper respiratory infections).   Stable 70mm right lower lobe nodule, stable 52mm right upper lobe nodule and one new 45mm nodule right upper lobe. Recommend repeating CT chest in 6 -12 months to continue to monitor   Please order CT chest wo contrast for 6 months

## 2019-12-28 ENCOUNTER — Telehealth: Payer: Self-pay | Admitting: Primary Care

## 2019-12-28 DIAGNOSIS — R918 Other nonspecific abnormal finding of lung field: Secondary | ICD-10-CM

## 2019-12-28 DIAGNOSIS — J479 Bronchiectasis, uncomplicated: Secondary | ICD-10-CM

## 2019-12-28 NOTE — Progress Notes (Signed)
Called and spoke with patient, provided CT scan test results per Derl Barrow NP.  CT of chest order placed, advised pcc would call to schedule.  Nothing further needed.

## 2019-12-31 NOTE — Telephone Encounter (Signed)
Vanessa Barbara, RN  12/28/2019 5:40 PM EDT     Called and spoke with patient, provided CT scan test results per Derl Barrow NP. CT of chest order placed, advised pcc would call to schedule. Nothing further needed.

## 2020-01-04 ENCOUNTER — Telehealth (HOSPITAL_COMMUNITY): Payer: Self-pay | Admitting: *Deleted

## 2020-01-04 NOTE — Telephone Encounter (Signed)
Reaching out to pt regarding upcoming cardiac CT scan to tell pt that Dr. Radford Pax only wanted a Calcium Score and that a Cardiac CT is not necessary for her.  The order for cardiac CT was placed incorrectly. Pt verbalized understanding.  Burley Saver, Cardiac RN Navigator Office: [phone]

## 2020-01-09 ENCOUNTER — Ambulatory Visit (HOSPITAL_COMMUNITY): Admission: RE | Admit: 2020-01-09 | Payer: Medicare PPO | Source: Ambulatory Visit

## 2020-01-28 DIAGNOSIS — H00012 Hordeolum externum right lower eyelid: Secondary | ICD-10-CM | POA: Diagnosis not present

## 2020-02-09 ENCOUNTER — Other Ambulatory Visit: Payer: Self-pay | Admitting: Internal Medicine

## 2020-03-13 DIAGNOSIS — H02884 Meibomian gland dysfunction left upper eyelid: Secondary | ICD-10-CM | POA: Diagnosis not present

## 2020-04-18 NOTE — Telephone Encounter (Signed)
Scheduled patient for prolia injection 05/01/2019. Patient will owe $0

## 2020-04-20 ENCOUNTER — Encounter: Payer: Self-pay | Admitting: Family Medicine

## 2020-04-21 NOTE — Telephone Encounter (Signed)
I believe we sent her results to scan and it has not been loaded to her chart yet. Please advise

## 2020-04-30 ENCOUNTER — Ambulatory Visit: Payer: Medicare PPO

## 2020-05-05 ENCOUNTER — Other Ambulatory Visit: Payer: Self-pay

## 2020-05-05 ENCOUNTER — Ambulatory Visit: Payer: Medicare PPO | Admitting: Family Medicine

## 2020-05-05 ENCOUNTER — Encounter: Payer: Self-pay | Admitting: Family Medicine

## 2020-05-05 VITALS — BP 122/8 | HR 57 | Temp 98.7°F | Resp 18 | Wt 118.6 lb

## 2020-05-05 DIAGNOSIS — I10 Essential (primary) hypertension: Secondary | ICD-10-CM

## 2020-05-05 DIAGNOSIS — E785 Hyperlipidemia, unspecified: Secondary | ICD-10-CM | POA: Diagnosis not present

## 2020-05-05 DIAGNOSIS — M810 Age-related osteoporosis without current pathological fracture: Secondary | ICD-10-CM | POA: Diagnosis not present

## 2020-05-05 DIAGNOSIS — I25119 Atherosclerotic heart disease of native coronary artery with unspecified angina pectoris: Secondary | ICD-10-CM | POA: Diagnosis not present

## 2020-05-05 DIAGNOSIS — J479 Bronchiectasis, uncomplicated: Secondary | ICD-10-CM | POA: Diagnosis not present

## 2020-05-05 DIAGNOSIS — I2510 Atherosclerotic heart disease of native coronary artery without angina pectoris: Secondary | ICD-10-CM | POA: Insufficient documentation

## 2020-05-05 LAB — COMPREHENSIVE METABOLIC PANEL
ALT: 17 U/L (ref 0–35)
AST: 14 U/L (ref 0–37)
Albumin: 4.4 g/dL (ref 3.5–5.2)
Alkaline Phosphatase: 111 U/L (ref 39–117)
BUN: 11 mg/dL (ref 6–23)
CO2: 30 mEq/L (ref 19–32)
Calcium: 9.3 mg/dL (ref 8.4–10.5)
Chloride: 98 mEq/L (ref 96–112)
Creatinine, Ser: 0.52 mg/dL (ref 0.40–1.20)
GFR: 96.36 mL/min (ref >60.00)
Glucose, Bld: 90 mg/dL (ref 70–99)
Potassium: 4 mEq/L (ref 3.5–5.1)
Sodium: 134 mEq/L — ABNORMAL LOW (ref 135–145)
Total Bilirubin: 0.5 mg/dL (ref 0.2–1.2)
Total Protein: 6.9 g/dL (ref 6.0–8.3)

## 2020-05-05 LAB — LIPID PANEL
Cholesterol: 131 mg/dL (ref 0–200)
HDL: 66.3 mg/dL (ref >39.00)
LDL Cholesterol: 53 mg/dL (ref 0–99)
NonHDL: 65.14
Total CHOL/HDL Ratio: 2
Triglycerides: 61 mg/dL (ref 0.0–149.0)
VLDL: 12.2 mg/dL (ref 0.0–40.0)

## 2020-05-05 MED ORDER — ALENDRONATE SODIUM 70 MG PO TABS: 70.0000 mg | ORAL_TABLET | ORAL | 3 refills | Status: DC

## 2020-05-05 NOTE — Assessment & Plan Note (Signed)
Tolerating statin, encouraged heart healthy diet, avoid trans fats, minimize simple carbs and saturated fats. Increase exercise as tolerated 

## 2020-05-05 NOTE — Assessment & Plan Note (Signed)
Start fosamax weekly and recheck 1 year

## 2020-05-05 NOTE — Patient Instructions (Signed)
Osteoporosis  Osteoporosis happens when the bones become thin and less dense than normal. Osteoporosis makes bones more brittle and fragile and more likely to break (fracture). Over time, osteoporosis can cause your bones to become so weak that they fracture after a minor fall. Bones in the hip, wrist, and spine are most likely to fracture due to osteoporosis. What are the causes? The exact cause of this condition is not known. What increases the risk? You are more likely to develop this condition if you:  Have family members with this condition.  Have poor nutrition.  Use the following: ? Steroid medicines, such as prednisone. ? Anti-seizure medicines. ? Nicotine or tobacco, such as cigarettes, e-cigarettes, and chewing tobacco.  Are female.  Are age 50 or older.  Are not physically active (are sedentary).  Are of European or Asian descent.  Have a small body frame. What are the signs or symptoms? A fracture might be the first sign of osteoporosis, especially if the fracture results from a fall or injury that usually would not cause a bone to break. Other signs and symptoms include:  Pain in the neck or low back.  Stooped posture.  Loss of height. How is this diagnosed? This condition may be diagnosed based on:  Your medical history.  A physical exam.  A bone mineral density test, also called a DXA or DEXA test (dual-energy X-ray absorptiometry test). This test uses X-rays to measure the amount of minerals in your bones. How is this treated? This condition may be treated by:  Making lifestyle changes, such as: ? Including foods with more calcium and vitamin D in your diet. ? Doing weight-bearing and muscle-strengthening exercises. ? Stopping tobacco use. ? Limiting alcohol intake.  Taking medicine to slow the process of bone loss or to increase bone density.  Taking daily supplements of calcium and vitamin D.  Taking hormone replacement medicines, such as  estrogen for women and testosterone for men.  Monitoring your levels of calcium and vitamin D. The goal of treatment is to strengthen your bones and lower your risk for a fracture. Follow these instructions at home: Eating and drinking Include calcium and vitamin D in your diet. Calcium is important for bone health, and vitamin D helps your body absorb calcium. Good sources of calcium and vitamin D include:  Certain fatty fish, such as salmon and tuna.  Products that have calcium and vitamin D added to them (are fortified), such as fortified cereals.  Egg yolks.  Cheese.  Liver.   Activity Do exercises as told by your health care provider. Ask your health care provider what exercises and activities are safe for you. You should do:  Exercises that make you work against gravity (weight-bearing exercises), such as tai chi, yoga, or walking.  Exercises to strengthen muscles, such as lifting weights. Lifestyle  Do not drink alcohol if: ? Your health care provider tells you not to drink. ? You are pregnant, may be pregnant, or are planning to become pregnant.  If you drink alcohol: ? Limit how much you use to:  0-1 drink a day for women.  0-2 drinks a day for men.  Know how much alcohol is in your drink. In the [place] one drink equals one 12 oz bottle of beer (355 mL), one 5 oz glass of wine (148 mL), or one 1 oz glass of hard liquor (44 mL).  Do not use any products that contain nicotine or tobacco, such as cigarettes, e-cigarettes, and chewing tobacco.   If you need help quitting, ask your health care provider. Preventing falls  Use devices to help you move around (mobility aids) as needed, such as canes, walkers, scooters, or crutches.  Keep rooms well-lit and clutter-free.  Remove tripping hazards from walkways, including cords and throw rugs.  Install grab bars in bathrooms and safety rails on stairs.  Use rubber mats in the bathroom and other areas that are often wet or  slippery.  Wear closed-toe shoes that fit well and support your feet. Wear shoes that have rubber soles or low heels.  Review your medicines with your health care provider. Some medicines can cause dizziness or changes in blood pressure, which can increase your risk of falling. General instructions  Take over-the-counter and prescription medicines only as told by your health care provider.  Keep all follow-up visits. This is important. Contact a health care provider if:  You have never been screened for osteoporosis and you are: ? A woman who is age 65 or older. ? A man who is age 70 or older. Get help right away if:  You fall or injure yourself. Summary  Osteoporosis is thinning and loss of density in your bones. This makes bones more brittle and fragile and more likely to break (fracture),even with minor falls.  The goal of treatment is to strengthen your bones and lower your risk for a fracture.  Include calcium and vitamin D in your diet. Calcium is important for bone health, and vitamin D helps your body absorb calcium.  Talk with your health care provider about screening for osteoporosis if you are a woman who is age 65 or older, or a man who is age 70 or older. This information is not intended to replace advice given to you by your health care provider. Make sure you discuss any questions you have with your health care provider. Document Revised: 07/26/2019 Document Reviewed: 07/26/2019 [employer] Patient Education  2021 [employer]  

## 2020-05-05 NOTE — Assessment & Plan Note (Signed)
Well controlled, no changes to meds. Encouraged heart healthy diet such as the DASH diet and exercise as tolerated.  °

## 2020-05-05 NOTE — Progress Notes (Signed)
Patient ID: [name], female    DOB: [birth year]  Age: 65 y.o. MRN: [id number]    Subjective:  Subjective  HPI [name] presents for office visit today. She recently underwent a Dexa scan on 04/20/2020 and she was dx with osteoporosis worse on the left hip and femoral neck . She is take Viactiv Calcium for the bones. She notes that she has been monitoring her diet intake. She notes that she was a very active young adult(Jazzercise) .   She has a PMHx of Atherosclerosis and Bronchiectasis she is being monitored by Cardiology and Pulmonology respectively. Denies any swelling in the ankles. She denies any SOB, abdominal pain, N/VD, back pain, fever, chills, sore throat, vaginal pain, or dysuria at this time. She denies having any GERD issues.   Review of Systems  Constitutional: Negative for chills, fatigue and fever.  HENT: Negative for congestion, ear pain, rhinorrhea, sinus pressure and sore throat.   Eyes: Negative for pain.  Respiratory: Negative for cough and shortness of breath.   Cardiovascular: Negative for chest pain, palpitations and leg swelling.  Gastrointestinal: Negative for abdominal pain, blood in stool, constipation, diarrhea, nausea and vomiting.  Genitourinary: Negative for dysuria, frequency, hematuria, urgency, vaginal bleeding, vaginal discharge and vaginal pain.  Musculoskeletal: Negative for back pain and myalgias.  Skin: Negative for rash.  Allergic/Immunologic: Negative for environmental allergies.  Neurological: Negative for dizziness and headaches.    History Past Medical History:  Diagnosis Date  . Allergy   . Atherosclerosis 07/08/2019  . Cataract   . Chronic cough 01/24/2019  . Costochondritis 01/24/2019  . Epigastric pain 07/29/2016  . FIBROIDS, UTERUS 01/18/2007   Qualifier: Diagnosis of  By: Jerold Coombe    . H/O Bell's palsy 05/2010  . Hypertension   . HYPERTENSION 03/24/2007   Qualifier: Diagnosis of  By: Ronnald Ramp CMA, Chemira    .  MENORRHAGIA, PERIMENOPAUSAL 01/13/2007   Qualifier: Diagnosis of  By: Jerold Coombe    . MOLE 05/07/2010   Qualifier: Diagnosis of  By: Jerold Coombe    . Osteopenia 08/23/2014  . RUQ pain 07/29/2016  . Spontaneous pneumothorax 2005   R, s/p tube, then VATS  . Spontaneous tension pneumothorax 03/24/2007   Qualifier: Diagnosis of  By: Jerold Coombe      She has a past surgical history that includes Chest tube insertion (2005); Video assisted thoracoscopy (2005); Cesarean section; spirometry (01/20/2015); Colonoscopy; Wisdom tooth extraction; Video bronchoscopy (N/A, 08/06/2019); and Bronchial washings (08/06/2019).   Her family history includes Cancer (age of onset: 48) in her maternal grandmother; Diabetes in her maternal grandmother; Heart disease in her father; Hypertension in her father and mother; Stroke in her mother.She reports that she quit smoking about 37 years ago. Her smoking use included cigarettes. She has a 14.00 pack-year smoking history. She has never used smokeless tobacco. She reports current alcohol use. She reports that she does not use drugs.  Current Outpatient Medications on File Prior to Visit  Medication Sig Dispense Refill  . acetaminophen (TYLENOL) 500 MG tablet Take 500 mg by mouth every 6 (six) hours as needed for moderate pain or headache.    . albuterol (VENTOLIN HFA) 108 (90 Base) MCG/ACT inhaler Inhale 1-2 puffs into the lungs every 6 (six) hours as needed for wheezing or shortness of breath.    Marland Kitchen atorvastatin (LIPITOR) 20 MG tablet Take 1 tablet (20 mg total) by mouth daily. 90 tablet 3  . azelastine (ASTELIN) 0.1 % nasal  spray Place 1 spray into both nostrils 2 (two) times daily.  3  . Calcium Carb-Cholecalciferol (CALCIUM 600 + D PO) Take 1 tablet by mouth daily.    . fluticasone (FLONASE) 50 MCG/ACT nasal spray Place 1 spray into both nostrils daily.    Marland Kitchen gabapentin (NEURONTIN) 300 MG capsule Take 1 capsule (300 mg total) by mouth 3 (three) times daily.  Take 300mg  daily once daily for 7 days, then 300mg  twice daily for 7 days then 300mg  3 times daily to continue 21 capsule 3  . gabapentin (NEURONTIN) 300 MG capsule TAKE 1 CAPSULE BY MOUTH DAILY FOR 7 DAYS, THEN 1 CAPSULE TWICE DAILY FOR 7 DAYS, THEN 1 CAPSULE THREE TIMES DAILY THEREAFTER 120 capsule 1  . levocetirizine (XYZAL) 5 MG tablet Take 5 mg by mouth every evening.     . montelukast (SINGULAIR) 10 MG tablet Take 10 mg by mouth at bedtime.    Marland Kitchen spironolactone (ALDACTONE) 25 MG tablet Take 1 tablet (25 mg total) by mouth daily. 90 tablet 3  . valsartan (DIOVAN) 160 MG tablet Take 1 tablet (160 mg total) by mouth daily. 90 tablet 3  . metoprolol tartrate (LOPRESSOR) 100 MG tablet Take 1 tablet (100 mg total) two hours prior to CT scan. 1 tablet 0   Current Facility-Administered Medications on File Prior to Visit  Medication Dose Route Frequency Provider Last Rate Last Admin  . gi cocktail (Maalox,Lidocaine,Donnatal)  30 mL Oral Once Carollee Herter, Kendrick Fries R, DO         Objective:  Objective  Physical Exam Constitutional:      General: She is not in acute distress.    Appearance: Normal appearance. She is well-developed. She is not ill-appearing.  HENT:     Head: Normocephalic and atraumatic.     Right Ear: External ear normal.     Left Ear: External ear normal.     Nose: Nose normal.  Eyes:     General:        Right eye: No discharge.        Left eye: No discharge.     Extraocular Movements: Extraocular movements intact.     Pupils: Pupils are equal, round, and reactive to light.  Cardiovascular:     Rate and Rhythm: Normal rate and regular rhythm.     Pulses: Normal pulses.     Heart sounds: Normal heart sounds. No murmur heard.   Pulmonary:     Effort: Pulmonary effort is normal. No respiratory distress.     Breath sounds: Normal breath sounds. No wheezing or rales.  Abdominal:     General: Bowel sounds are normal.     Palpations: Abdomen is soft.     Tenderness: There  is no abdominal tenderness.  Musculoskeletal:     Cervical back: Normal range of motion and neck supple.  Skin:    General: Skin is warm and dry.  Neurological:     Mental Status: She is alert and oriented to person, place, and time.  Psychiatric:        Behavior: Behavior normal.    BP (!) 122/8 (BP Location: Left Arm, Patient Position: Sitting, Cuff Size: Normal)   Pulse (!) 57   Temp 98.7 F (37.1 C) (Oral)   Resp 18   Wt 118 lb 9.6 oz (53.8 kg)   SpO2 97%   BMI 21.01 kg/m  Wt Readings from Last 3 Encounters:  05/05/20 118 lb 9.6 oz (53.8 kg)  12/20/19 116 lb 3.2 oz (  52.7 kg)  12/11/19 114 lb (51.7 kg)     Lab Results  Component Value Date   WBC 4.9 12/11/2019   HGB 12.4 12/11/2019   HCT 36.5 12/11/2019   PLT 191 12/11/2019   GLUCOSE 147 (H) 12/11/2019   CHOL 140 11/05/2019   TRIG 65 11/05/2019   HDL 64 11/05/2019   LDLDIRECT 111.7 05/07/2010   LDLCALC 62 11/05/2019   ALT 18 11/05/2019   AST 16 11/05/2019   NA 137 12/11/2019   K 3.4 (L) 12/11/2019   CL 100 12/11/2019   CREATININE 0.58 12/11/2019   BUN 8 12/11/2019   CO2 26 12/11/2019   TSH 0.50 10/21/2017   MICROALBUR 0.2 02/21/2014    CT Chest Wo Contrast  Result Date: 12/24/2019 CLINICAL DATA:  Patient with chronic cough. EXAM: CT CHEST WITHOUT CONTRAST TECHNIQUE: Multidetector CT imaging of the chest was performed following the standard protocol without IV contrast. COMPARISON:  Coronary CT calcium study July 1921; chest radiograph 08/06/2019; chest CT Jun 29, 2019 FINDINGS: Cardiovascular: Normal heart size. Trace fluid superior pericardial recess. Thoracic aortic vascular calcifications. Mediastinum/Nodes: No enlarged axillary, mediastinal or hilar lymphadenopathy. Normal appearance of the esophagus. Lungs/Pleura: Central airways are patent. Wedge resection suture line right lung apex. No large area pulmonary consolidation. Scattered cylindrical bronchiectasis in the lungs bilaterally most prominent in the  right middle lobe. Stable 6 mm right lower lobe nodule (image 87; series 3). Stable 3 mm right upper lobe nodule (image 30; series 3). New 3 mm right upper lobe nodule (image 48; series 3). Upper Abdomen: Redemonstrated low-attenuation lesion posterior right hepatic lobe characterizes hemangioma prior MRI. Musculoskeletal: Thoracic spine degenerative changes. No aggressive or acute appearing osseous lesions. IMPRESSION: 1. New 3 mm right upper lobe nodule. Additional previously visualized nodule within the right lower lobe measuring up to 6 mm average diameter 6 mm is unchanged. Recommend follow-up chest CT in 6-12 months to assess for interval change. 2. Aortic atherosclerosis. Electronically Signed   By: Lovey Newcomer M.D.   On: 12/24/2019 15:48     Assessment & Plan:  Plan    Meds ordered this encounter  Medications  . alendronate (FOSAMAX) 70 MG tablet    Sig: Take 1 tablet (70 mg total) by mouth every 7 (seven) days. Take with a full glass of water on an empty stomach.    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 Increase exercise as tolerated      Relevant Orders   Lipid panel   Comprehensive metabolic panel   Primary hypertension    Well controlled, no changes to meds. Encouraged heart healthy diet such as the DASH diet and exercise as tolerated.       Relevant Orders   Lipid panel   Comprehensive metabolic panel      Follow-up: Return in about 6 months (around 11/05/2020),  or if symptoms worsen or fail to improve, for annual  exam, fasting.   I,Alexis Bryant,acting as a Education administrator for [employer], DO.,have documented all relevant documentation on the behalf of Ann Held, DO,as directed by  Ann Held, DO while in the presence of [place], DO, have reviewed all documentation for this visit. The documentation on 05/05/20 for the exam, diagnosis, procedures, and orders are all accurate and complete.

## 2020-05-05 NOTE — Assessment & Plan Note (Signed)
Per pulm 

## 2020-06-12 ENCOUNTER — Other Ambulatory Visit: Payer: Self-pay | Admitting: Internal Medicine

## 2020-06-19 DIAGNOSIS — H40013 Open angle with borderline findings, low risk, bilateral: Secondary | ICD-10-CM | POA: Diagnosis not present

## 2020-06-19 DIAGNOSIS — H348322 Tributary (branch) retinal vein occlusion, left eye, stable: Secondary | ICD-10-CM | POA: Diagnosis not present

## 2020-06-19 DIAGNOSIS — H18413 Arcus senilis, bilateral: Secondary | ICD-10-CM | POA: Diagnosis not present

## 2020-06-19 DIAGNOSIS — H2513 Age-related nuclear cataract, bilateral: Secondary | ICD-10-CM | POA: Diagnosis not present

## 2020-06-19 DIAGNOSIS — H25013 Cortical age-related cataract, bilateral: Secondary | ICD-10-CM | POA: Diagnosis not present

## 2020-06-19 DIAGNOSIS — H2511 Age-related nuclear cataract, right eye: Secondary | ICD-10-CM | POA: Diagnosis not present

## 2020-06-19 DIAGNOSIS — H25043 Posterior subcapsular polar age-related cataract, bilateral: Secondary | ICD-10-CM | POA: Diagnosis not present

## 2020-07-01 ENCOUNTER — Ambulatory Visit
Admission: RE | Admit: 2020-07-01 | Discharge: 2020-07-01 | Disposition: A | Discharge status: Home / Self Care | Payer: Medicare PPO | Source: Ambulatory Visit | Attending: Primary Care | Admitting: Primary Care

## 2020-07-01 DIAGNOSIS — R918 Other nonspecific abnormal finding of lung field: Secondary | ICD-10-CM

## 2020-07-01 DIAGNOSIS — J479 Bronchiectasis, uncomplicated: Secondary | ICD-10-CM

## 2020-07-02 NOTE — Progress Notes (Signed)
Please let patient know CT chest showed resolution RUL pulmonary nodule, other tiny scattered pulmonary nodules are stable. No new nodules or masses. Atherosclerotic calcification noted in wall of thoracic aorta. She should follow up with cardiology in June as scheduled.Recommend repeat CT chest in 18 months. She is over due for follow-up with MR. Needs visit with him sometimes before August

## 2020-07-14 DIAGNOSIS — H43813 Vitreous degeneration, bilateral: Secondary | ICD-10-CM | POA: Diagnosis not present

## 2020-07-14 DIAGNOSIS — H348322 Tributary (branch) retinal vein occlusion, left eye, stable: Secondary | ICD-10-CM | POA: Diagnosis not present

## 2020-07-14 DIAGNOSIS — H35033 Hypertensive retinopathy, bilateral: Secondary | ICD-10-CM | POA: Diagnosis not present

## 2020-07-14 DIAGNOSIS — H3562 Retinal hemorrhage, left eye: Secondary | ICD-10-CM | POA: Diagnosis not present

## 2020-07-23 DIAGNOSIS — Z1231 Encounter for screening mammogram for malignant neoplasm of breast: Secondary | ICD-10-CM | POA: Diagnosis not present

## 2020-07-23 LAB — HM MAMMOGRAPHY

## 2020-07-30 DIAGNOSIS — H2511 Age-related nuclear cataract, right eye: Secondary | ICD-10-CM | POA: Diagnosis not present

## 2020-07-30 DIAGNOSIS — H25011 Cortical age-related cataract, right eye: Secondary | ICD-10-CM | POA: Diagnosis not present

## 2020-07-31 ENCOUNTER — Ambulatory Visit: Payer: Medicare PPO | Admitting: Cardiology

## 2020-07-31 DIAGNOSIS — H2512 Age-related nuclear cataract, left eye: Secondary | ICD-10-CM | POA: Diagnosis not present

## 2020-08-05 DIAGNOSIS — H348322 Tributary (branch) retinal vein occlusion, left eye, stable: Secondary | ICD-10-CM | POA: Diagnosis not present

## 2020-08-05 DIAGNOSIS — H43813 Vitreous degeneration, bilateral: Secondary | ICD-10-CM | POA: Diagnosis not present

## 2020-08-06 ENCOUNTER — Other Ambulatory Visit: Payer: Self-pay

## 2020-08-06 ENCOUNTER — Ambulatory Visit: Payer: Medicare PPO | Admitting: Cardiology

## 2020-08-06 ENCOUNTER — Encounter: Payer: Self-pay | Admitting: Cardiology

## 2020-08-06 VITALS — BP 120/76 | HR 76 | Ht 63.0 in | Wt 117.4 lb

## 2020-08-06 DIAGNOSIS — E7800 Pure hypercholesterolemia, unspecified: Secondary | ICD-10-CM

## 2020-08-06 DIAGNOSIS — I10 Essential (primary) hypertension: Secondary | ICD-10-CM | POA: Diagnosis not present

## 2020-08-06 DIAGNOSIS — R931 Abnormal findings on diagnostic imaging of heart and coronary circulation: Secondary | ICD-10-CM

## 2020-08-06 MED ORDER — ATORVASTATIN CALCIUM 20 MG PO TABS: 20.0000 mg | ORAL_TABLET | Freq: Every day | ORAL | 3 refills | Status: DC

## 2020-08-06 MED ORDER — SPIRONOLACTONE 25 MG PO TABS: 25.0000 mg | ORAL_TABLET | Freq: Every day | ORAL | 3 refills | Status: DC

## 2020-08-06 MED ORDER — VALSARTAN 160 MG PO TABS: 160.0000 mg | ORAL_TABLET | Freq: Every day | ORAL | 3 refills | Status: DC

## 2020-08-06 NOTE — Progress Notes (Signed)
Cardiology Office Note    Date:  08/06/2020   ID:  [name], DOB [birth year], MRN [id number]  PCP:  Carollee Herter, Alferd Apa, DO  Cardiologist:  Fransico Him, MD   Chief Complaint  Patient presents with   Follow-up    Coronary artery calcifications, HTN, HLD   Coronary Artery Disease   Hypertension   Hyperlipidemia     History of Present Illness:  [name] is a 65 y.o. female with a hx of chronic cough (followed with Pulmonary), HTN, spontaneous PTX s/p VATS and coronary artery calcifications. She underwent HRCT for chronic cough and was found to have coronary atherosclerosis in the RCA and LAD.   Coronary Ca score was obtained that was 38 and was 69th% for age and sex matched controls.  Lexiscan myoview 07/2019 showed no ischemia.   She is here today for followup.  She denies any chest pain or pressure, SOB, DOE, PND, orthopnea, LE edema, dizziness, palpitations or syncope. She is compliant with her meds and is tolerating meds with no SE.    Past Medical History:  Diagnosis Date   Allergy    Atherosclerosis 07/08/2019   Cataract    Chronic cough 01/24/2019   Costochondritis 01/24/2019   Epigastric pain 07/29/2016   FIBROIDS, UTERUS 01/18/2007   Qualifier: Diagnosis of  By: Jerold Coombe     H/O Bell's palsy 05/2010   Hypertension    HYPERTENSION 03/24/2007   Qualifier: Diagnosis of  By: Ronnald Ramp CMA, Chemira     MENORRHAGIA, PERIMENOPAUSAL 01/13/2007   Qualifier: Diagnosis of  By: Jerold Coombe     MOLE 05/07/2010   Qualifier: Diagnosis of  By: Jerold Coombe     Osteopenia 08/23/2014   RUQ pain 07/29/2016   Spontaneous pneumothorax 2005   R, s/p tube, then VATS   Spontaneous tension pneumothorax 03/24/2007   Qualifier: Diagnosis of  By: Jerold Coombe      Past Surgical History:  Procedure Laterality Date   BRONCHIAL WASHINGS  08/06/2019   Procedure: BRONCHIAL lavage;  Surgeon: Brand Males, MD;  Location: [facility];  Service: Endoscopy;;   CESAREAN  SECTION     CHEST TUBE INSERTION  2005   spontaneus Pneumothorax   COLONOSCOPY     SPIROMETRY  01/20/2015   Dr. Donneta Romberg   VIDEO ASSISTED THORACOSCOPY  2005   VIDEO BRONCHOSCOPY N/A 08/06/2019   Procedure: VIDEO BRONCHOSCOPY WITHOUT FLUORO;  Surgeon: Brand Males, MD;  Location: [facility];  Service: Endoscopy;  Laterality: N/A;   WISDOM TOOTH EXTRACTION      Current Medications: Current Meds  Medication Sig   acetaminophen (TYLENOL) 500 MG tablet Take 500 mg by mouth every 6 (six) hours as needed for moderate pain or headache.   albuterol (VENTOLIN HFA) 108 (90 Base) MCG/ACT inhaler Inhale 1-2 puffs into the lungs every 6 (six) hours as needed for wheezing or shortness of breath.   alendronate (FOSAMAX) 70 MG tablet Take 1 tablet (70 mg total) by mouth every 7 (seven) days. Take with a full glass of water on an empty stomach.   atorvastatin (LIPITOR) 20 MG tablet Take 1 tablet (20 mg total) by mouth daily.   azelastine (ASTELIN) 0.1 % nasal spray Place 1 spray into both nostrils 2 (two) times daily.   Calcium Carb-Cholecalciferol (CALCIUM 600 + D PO) Take 1 tablet by mouth daily.   fluticasone (FLONASE) 50 MCG/ACT nasal spray Place 1 spray into both nostrils daily.   gabapentin (NEURONTIN) 300  MG capsule TAKE 1 CAPSULE BY MOUTH DAILY FOR 7 DAYS THEN TAKE 1 CAPSULE BY MOUTH TWICE DAILY FOR 7 DAYS THEN TAKE 1 CAPSULE BY MOUTH THREE TIMES DAILY THEREAFTER   levocetirizine (XYZAL) 5 MG tablet Take 5 mg by mouth every evening.    montelukast (SINGULAIR) 10 MG tablet Take 10 mg by mouth at bedtime.   spironolactone (ALDACTONE) 25 MG tablet Take 1 tablet (25 mg total) by mouth daily.   valsartan (DIOVAN) 160 MG tablet Take 1 tablet (160 mg total) by mouth daily.   Current Facility-Administered Medications for the 08/06/20 encounter (Office Visit) with Sueanne Margarita, MD  Medication   gi cocktail (Maalox,Lidocaine,Donnatal)    Allergies:   Patient has no known allergies.   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    Employer: [employer]  Tobacco Use   Smoking status: Former    Packs/day: 1.00    Years: 14.00    Pack years: 14.00    Types: Cigarettes    Quit date: 07/11/1982    Years since quitting: 38.0   Smokeless tobacco: Never  Vaping Use   Vaping Use: Never used  Substance and Sexual Activity   Alcohol use: Yes    Alcohol/week: 0.0 standard drinks    Comment: rare   Drug use: No   Sexual activity: Yes    Partners: Male    Birth control/protection: Post-menopausal    Comment: 1st intercourse 65 yo-Fewer than 5 partners  Other Topics Concern   Not on file  Social History Narrative   Exercise-- jazzersize  3x a week    Social Determinants of Radio broadcast assistant Strain: Not on file  Food Insecurity: Not on file  Transportation Needs: Not on file  Physical Activity: Not on file  Stress: Not on file  Social Connections: Not on file     Family History:  The patient's family history includes Cancer (age of onset: 31) in her maternal grandmother; Diabetes in her maternal grandmother; Heart disease in her father; Hypertension in her father and mother; Stroke in her mother.   ROS:   Please see the history of present illness.    ROS All other systems reviewed and are negative.  No flowsheet data found.     PHYSICAL EXAM:   VS:  BP 120/76   Pulse 76   Ht 5\' 3"  (1.6 m)   Wt 117 lb 6.4 oz (53.3 kg)   SpO2 98%   BMI 20.80 kg/m     GEN: Well nourished, well developed in no acute distress HEENT: Normal NECK: No JVD; No carotid bruits LYMPHATICS: No lymphadenopathy CARDIAC:RRR, no murmurs, rubs, gallops RESPIRATORY:  Clear to auscultation without rales, wheezing or rhonchi  ABDOMEN: Soft, non-tender,  non-distended MUSCULOSKELETAL:  No edema; No deformity  SKIN: Warm and dry NEUROLOGIC:  Alert and oriented x 3 PSYCHIATRIC:  Normal affect    Wt Readings from Last 3 Encounters:  08/06/20 117 lb 6.4 oz (53.3 kg)  05/05/20 118 lb 9.6 oz (53.8 kg)  12/20/19 116 lb 3.2 oz (52.7 kg)      Studies/Labs Reviewed:   EKG:  EKG is not ordered today.  T  Recent Labs: 12/11/2019: Hemoglobin 12.4; Platelets 191 05/05/2020: ALT  17; BUN 11; Creatinine, Ser 0.52; Potassium 4.0; Sodium 134   Lipid Panel    Component Value Date/Time   CHOL 131 05/05/2020 1053   CHOL 173 09/12/2019 1029   TRIG 61.0 05/05/2020 1053   HDL 66.30 05/05/2020 1053   HDL 75 09/12/2019 1029   CHOLHDL 2 05/05/2020 1053   VLDL 12.2 05/05/2020 1053   LDLCALC 53 05/05/2020 1053   LDLCALC 62 11/05/2019 1353   LDLDIRECT 111.7 05/07/2010 1333    Additional studies/ records that were reviewed today include:  Coronary Ca score and Lexi myoview    ASSESSMENT:    1. Agatston coronary artery calcium score less than 100   2. Primary hypertension   3. Pure hypercholesterolemia      PLAN:  In order of problems listed above:  1.  Coronary artery calcifications -noted on HRCT for workup of cough -coronary Ca score was 38 and was 69th% for age and sex matched controls in 08/2019. -her CRFs include HTN, postmenopausal, remote tobacco use and fm hx of CAD in her father Leane Call 07/2019 showed no ischemia -she denies any anginal sx -Continue prescription drug management with Atorvastatin 20mg  daily   2.  HTN -her BP is well controlled on exam today -Continue prescription drug management with Valsartan 160mg  daily and spiro 25mg  daily >> refilled for 1 year -I have personally March 2022   3.  HLD -LDL goal < 70 -I have personally reviewed and interpreted outside labs performed by patient's PCP which showed LDL 53, HDL 66, TAGS 61 and ALT 17 in March 2022 -Continue prescription drug management with  Atorvastatin 20mg  daily     Medication Adjustments/Labs and Tests Ordered: Current medicines are reviewed at length with the patient today.  Concerns regarding medicines are outlined above.  Medication changes, Labs and Tests ordered today are listed in the Patient Instructions below.  There are no Patient Instructions on file for this visit.   Signed, Fransico Him, MD  08/06/2020 10:33 AM    [facility] [address], [place], [place]  [address] Phone: [phone]; Fax: [phone]

## 2020-08-06 NOTE — Patient Instructions (Signed)
Medication Instructions:  Your physician recommends that you continue on your current medications as directed. Please refer to the Current Medication list given to you today.  *If you need a refill on your cardiac medications before your next appointment, please call your pharmacy*   Lab Work: None ordered  If you have labs (blood work) drawn today and your tests are completely normal, you will receive your results only by: [place] (if you have MyChart) OR A paper copy in the mail If you have any lab test that is abnormal or we need to change your treatment, we will call you to review the results.   Testing/Procedures: None ordered    Follow-Up: At [facility], you and your health needs are our priority.  As part of our continuing mission to provide you with exceptional heart care, we have created designated Provider Care Teams.  These Care Teams include your primary Cardiologist (physician) and Advanced Practice Providers (APPs -  Physician Assistants and Nurse Practitioners) who all work together to provide you with the care you need, when you need it.  We recommend signing up for the patient portal called "MyChart".  Sign up information is provided on this After Visit Summary.  MyChart is used to connect with patients for Virtual Visits (Telemedicine).  Patients are able to view lab/test results, encounter notes, upcoming appointments, etc.  Non-urgent messages can be sent to your provider as well.   To learn more about what you can do with MyChart, go to [email or web address].    Your next appointment:   12 month(s)  The format for your next appointment:   In Person  Provider:   You may see Fransico Him, MD or one of the following Advanced Practice Providers on your designated Care Team:   Melina Copa, PA-C Ermalinda Barrios, PA-C   Other Instructions None

## 2020-08-06 NOTE — Addendum Note (Signed)
Addended by: Mendel Ryder on: 08/06/2020 10:42 AM   Modules accepted: Orders

## 2020-08-13 DIAGNOSIS — H2512 Age-related nuclear cataract, left eye: Secondary | ICD-10-CM | POA: Diagnosis not present

## 2020-08-13 DIAGNOSIS — H25012 Cortical age-related cataract, left eye: Secondary | ICD-10-CM | POA: Diagnosis not present

## 2020-08-29 ENCOUNTER — Other Ambulatory Visit: Payer: Self-pay | Admitting: Cardiology

## 2020-09-04 DIAGNOSIS — Z961 Presence of intraocular lens: Secondary | ICD-10-CM | POA: Diagnosis not present

## 2020-09-11 ENCOUNTER — Other Ambulatory Visit: Payer: Self-pay | Admitting: Internal Medicine

## 2020-09-13 ENCOUNTER — Encounter: Payer: Self-pay | Admitting: Family Medicine

## 2020-09-15 NOTE — Telephone Encounter (Signed)
Noted  

## 2020-09-30 DIAGNOSIS — H3562 Retinal hemorrhage, left eye: Secondary | ICD-10-CM | POA: Diagnosis not present

## 2020-09-30 DIAGNOSIS — H35033 Hypertensive retinopathy, bilateral: Secondary | ICD-10-CM | POA: Diagnosis not present

## 2020-09-30 DIAGNOSIS — H348322 Tributary (branch) retinal vein occlusion, left eye, stable: Secondary | ICD-10-CM | POA: Diagnosis not present

## 2020-09-30 DIAGNOSIS — H43813 Vitreous degeneration, bilateral: Secondary | ICD-10-CM | POA: Diagnosis not present

## 2020-10-05 NOTE — Telephone Encounter (Signed)
MR please advise. Thanks! 

## 2020-10-06 MED ORDER — GABAPENTIN 300 MG PO CAPS: ORAL_CAPSULE | ORAL | 1 refills | Status: DC

## 2020-10-06 NOTE — Telephone Encounter (Signed)
She can go back on gabapentin but needs to see [place] back next 4-10 weeks  Take gabapentin '300mg'$  once daily x 7 days, then '300mg'$  twice daily x 7 days, then '300mg'$  three times daily to continue. If this makes you too sleepy or drowsy call us and we will cut your medication dosing down  I have assumed this was her dose

## 2020-10-08 NOTE — Telephone Encounter (Signed)
Hello Dr. Chase Caller, please advise on mychart message below, thanks!  I picked up the gabapentin.  Thank you! I was taking three capsules a day.  Do I need to start over by building the dosage - 1 for 7 days, 2 for 7 days until I reach 3 per day? Also, I have an appointment scheduled in September with you. [name] 07/24/1955

## 2020-10-09 ENCOUNTER — Encounter: Payer: Self-pay | Admitting: Family

## 2020-10-09 ENCOUNTER — Other Ambulatory Visit: Payer: Self-pay

## 2020-10-09 ENCOUNTER — Ambulatory Visit: Payer: Medicare PPO | Admitting: Family

## 2020-10-09 ENCOUNTER — Other Ambulatory Visit (HOSPITAL_COMMUNITY)
Admission: RE | Admit: 2020-10-09 | Discharge: 2020-10-09 | Disposition: A | Discharge status: Home / Self Care | Payer: Medicare PPO | Source: Ambulatory Visit | Attending: Family | Admitting: Family

## 2020-10-09 VITALS — BP 118/70 | HR 77 | Temp 97.4°F | Resp 16 | Ht 63.0 in | Wt 121.0 lb

## 2020-10-09 DIAGNOSIS — Z113 Encounter for screening for infections with a predominantly sexual mode of transmission: Secondary | ICD-10-CM

## 2020-10-09 DIAGNOSIS — N898 Other specified noninflammatory disorders of vagina: Secondary | ICD-10-CM

## 2020-10-09 DIAGNOSIS — R300 Dysuria: Secondary | ICD-10-CM | POA: Diagnosis not present

## 2020-10-09 LAB — POCT URINALYSIS DIP (MANUAL ENTRY)
Bilirubin, UA: NEGATIVE
Blood, UA: NEGATIVE
Glucose, UA: NEGATIVE mg/dL
Ketones, POC UA: NEGATIVE mg/dL
Leukocytes, UA: NEGATIVE
Nitrite, UA: NEGATIVE
Protein Ur, POC: NEGATIVE mg/dL
Spec Grav, UA: <=1.005 — AB (ref 1.010–1.025)
Urobilinogen, UA: 0.2 E.U./dL
pH, UA: 6 (ref 5.0–8.0)

## 2020-10-09 MED ORDER — VALACYCLOVIR HCL 500 MG PO TABS: ORAL_TABLET | ORAL | 1 refills | Status: DC

## 2020-10-09 NOTE — Telephone Encounter (Signed)
Yes increase as per the outline she hersefl gave

## 2020-10-09 NOTE — Progress Notes (Signed)
[name] is a 65 y.o. female with the following history as recorded in [employer]:  Patient Active Problem List   Diagnosis Date Noted   Coronary artery disease involving native heart with angina pectoris ([place]) 05/05/2020   Age-related osteoporosis without current pathological fracture 05/05/2020   Hyperlipidemia 05/05/2020   Jaw pain 12/23/2019   Bronchiectasis without complication ([place])    Atherosclerosis 07/08/2019   Costochondritis 01/24/2019   Chronic cough 01/24/2019   History of cigarette smoking 01/24/2019   RUQ pain 07/29/2016   Epigastric pain 07/29/2016   Osteopenia 08/23/2014   MOLE 05/07/2010   MAMMOGRAM, ABNORMAL 04/18/2008   Primary hypertension 03/24/2007   SPONTANEOUS TENSION PNEUMOTHORAX 03/24/2007   FIBROIDS, UTERUS 01/18/2007   MENORRHAGIA, PERIMENOPAUSAL 01/13/2007    Current Outpatient Medications  Medication Sig Dispense Refill   acetaminophen (TYLENOL) 500 MG tablet Take 500 mg by mouth every 6 (six) hours as needed for moderate pain or headache.     albuterol (VENTOLIN HFA) 108 (90 Base) MCG/ACT inhaler Inhale 1-2 puffs into the lungs every 6 (six) hours as needed for wheezing or shortness of breath.     alendronate (FOSAMAX) 70 MG tablet Take 1 tablet (70 mg total) by mouth every 7 (seven) days. Take with a full glass of water on an empty stomach. 12 tablet 3   atorvastatin (LIPITOR) 20 MG tablet TAKE 1 TABLET(20 MG) BY MOUTH DAILY 90 tablet 3   azelastine (ASTELIN) 0.1 % nasal spray Place 1 spray into both nostrils 2 (two) times daily.  3   Calcium Carb-Cholecalciferol (CALCIUM 600 + D PO) Take 1 tablet by mouth daily.     fluticasone (FLONASE) 50 MCG/ACT nasal spray Place 1 spray into both nostrils daily.     gabapentin (NEURONTIN) 300 MG capsule 300 mg daily x 7 days; 300 mg BID x 7 days, 300 mg TID thereafter. 90 capsule 1   levocetirizine (XYZAL) 5 MG tablet Take 5 mg by mouth every evening.      montelukast (SINGULAIR) 10 MG tablet Take 10 mg by  mouth at bedtime.     spironolactone (ALDACTONE) 25 MG tablet Take 1 tablet (25 mg total) by mouth daily. 90 tablet 3   valACYclovir (VALTREX) 500 MG tablet Take 1 tablet po bid x 5 days as needed for outbreak 30 tablet 1   valsartan (DIOVAN) 160 MG tablet Take 1 tablet (160 mg total) by mouth daily. 90 tablet 3   Current Facility-Administered Medications  Medication Dose Route Frequency Provider Last Rate Last Admin   gi cocktail (Maalox,Lidocaine,Donnatal)  30 mL Oral Once Roma Schanz R, DO        Allergies: Patient has no known allergies.  Past Medical History:  Diagnosis Date   Allergy    Cataract    Chronic cough 01/24/2019   Costochondritis 01/24/2019   Epigastric pain 07/29/2016   FIBROIDS, UTERUS 01/18/2007   Qualifier: Diagnosis of  By: Jerold Coombe     H/O Bell's palsy 05/2010   HYPERTENSION 03/24/2007   Qualifier: Diagnosis of  By: Ronnald Ramp CMA, Chemira     MENORRHAGIA, PERIMENOPAUSAL 01/13/2007   Qualifier: Diagnosis of  By: Jerold Coombe     MOLE 05/07/2010   Qualifier: Diagnosis of  By: Jerold Coombe     Osteopenia 08/23/2014   RUQ pain 07/29/2016   Spontaneous pneumothorax 2005   R, s/p tube, then VATS   Spontaneous tension pneumothorax 03/24/2007   Qualifier: Diagnosis of  By: Jerold Coombe  Past Surgical History:  Procedure Laterality Date   BRONCHIAL WASHINGS  08/06/2019   Procedure: BRONCHIAL lavage;  Surgeon: Brand Males, MD;  Location: [facility];  Service: Endoscopy;;   CESAREAN SECTION     CHEST TUBE INSERTION  2005   spontaneus Pneumothorax   COLONOSCOPY     SPIROMETRY  01/20/2015   Dr. Donneta Romberg   VIDEO ASSISTED THORACOSCOPY  2005   VIDEO BRONCHOSCOPY N/A 08/06/2019   Procedure: VIDEO BRONCHOSCOPY WITHOUT FLUORO;  Surgeon: Brand Males, MD;  Location: [facility];  Service: Endoscopy;  Laterality: N/A;   WISDOM TOOTH EXTRACTION      Family History  Problem Relation Age of Onset   Hypertension Mother    Stroke  Mother    Heart disease Father    Hypertension Father    Cancer Maternal Grandmother 34       breast   Diabetes Maternal Grandmother    Colon cancer Neg Hx    Esophageal cancer Neg Hx    Rectal cancer Neg Hx    Stomach cancer Neg Hx     Social History   Tobacco Use   Smoking status: Former    Packs/day: 1.00    Years: 14.00    Pack years: 14.00    Types: Cigarettes    Quit date: 07/11/1982    Years since quitting: 38.2   Smokeless tobacco: Never  Substance Use Topics   Alcohol use: Yes    Alcohol/week: 0.0 standard drinks    Comment: rare    Subjective:  Concerned for vaginal herpes outbreak; notes she has noticed some "blisters" and has had an outbreak in the past; Having some urinary discomfort as well; Would like to be tested for STDs to be safe;      Objective:  Vitals:   10/09/20 1300  BP: 118/70  Pulse: 77  Resp: 16  Temp: (!) 97.4 F (36.3 C)  SpO2: 98%  Weight: 121 lb (54.9 kg)  Height: '5\' 3"'$  (1.6 m)    General: Well developed, well nourished, in no acute distress  Skin : Warm and dry.  Head: Normocephalic and atraumatic  Lungs: Respirations unlabored;  Neurologic: Alert and oriented; speech intact; face symmetrical; moves all extremities well; CNII-XII intact without focal deficit  Pelvic exam- small blisters noted on outer labia with some surrounding erythema  Assessment:  1. Vaginal discharge   2. Dysuria   3. Screen for STD (sexually transmitted disease)     Plan:  Check vaginal swab for G/C, BV, yeast and HSV; will check urine culture and go ahead and start treatment for HSV with Valtrex 500 mg bid x 5 days prn; Follow up to be determined;   This visit occurred during the SARS-CoV-2 public health emergency.  Safety protocols were in place, including screening questions prior to the visit, additional usage of staff PPE, and extensive cleaning of exam room while observing appropriate contact time as indicated for disinfecting solutions.     No follow-ups on file.  Orders Placed This Encounter  Procedures   Urine Culture   POCT urinalysis dipstick    Requested Prescriptions   Signed Prescriptions Disp Refills   valACYclovir (VALTREX) 500 MG tablet 30 tablet 1    Sig: Take 1 tablet po bid x 5 days as needed for outbreak

## 2020-10-10 ENCOUNTER — Encounter: Payer: Self-pay | Admitting: Family

## 2020-10-10 LAB — URINE CULTURE
MICRO NUMBER:: 12260812
Result:: NO GROWTH
SPECIMEN QUALITY:: ADEQUATE

## 2020-10-10 LAB — CERVICOVAGINAL ANCILLARY ONLY
Bacterial Vaginitis (gardnerella): NEGATIVE
Candida Glabrata: NEGATIVE
Candida Vaginitis: NEGATIVE
Chlamydia: NEGATIVE
Comment: NEGATIVE
Comment: NEGATIVE
Comment: NEGATIVE
Comment: NEGATIVE
Comment: NEGATIVE
Comment: NORMAL
Neisseria Gonorrhea: NEGATIVE
Trichomonas: NEGATIVE

## 2020-11-04 DIAGNOSIS — J300 Vasomotor rhinitis: Secondary | ICD-10-CM | POA: Diagnosis not present

## 2020-11-04 DIAGNOSIS — R059 Cough, unspecified: Secondary | ICD-10-CM | POA: Diagnosis not present

## 2020-11-05 ENCOUNTER — Other Ambulatory Visit: Payer: Self-pay

## 2020-11-05 DIAGNOSIS — Z823 Family history of stroke: Secondary | ICD-10-CM | POA: Diagnosis not present

## 2020-11-05 DIAGNOSIS — K5900 Constipation, unspecified: Secondary | ICD-10-CM | POA: Diagnosis not present

## 2020-11-05 DIAGNOSIS — I25119 Atherosclerotic heart disease of native coronary artery with unspecified angina pectoris: Secondary | ICD-10-CM | POA: Diagnosis not present

## 2020-11-05 DIAGNOSIS — Z8249 Family history of ischemic heart disease and other diseases of the circulatory system: Secondary | ICD-10-CM | POA: Diagnosis not present

## 2020-11-05 DIAGNOSIS — J479 Bronchiectasis, uncomplicated: Secondary | ICD-10-CM | POA: Diagnosis not present

## 2020-11-05 DIAGNOSIS — J302 Other seasonal allergic rhinitis: Secondary | ICD-10-CM | POA: Diagnosis not present

## 2020-11-05 DIAGNOSIS — I7090 Unspecified atherosclerosis: Secondary | ICD-10-CM | POA: Diagnosis not present

## 2020-11-05 DIAGNOSIS — J439 Emphysema, unspecified: Secondary | ICD-10-CM | POA: Diagnosis not present

## 2020-11-05 DIAGNOSIS — I10 Essential (primary) hypertension: Secondary | ICD-10-CM | POA: Diagnosis not present

## 2020-11-05 DIAGNOSIS — Z87891 Personal history of nicotine dependence: Secondary | ICD-10-CM | POA: Diagnosis not present

## 2020-11-05 DIAGNOSIS — E785 Hyperlipidemia, unspecified: Secondary | ICD-10-CM | POA: Diagnosis not present

## 2020-11-05 DIAGNOSIS — M1990 Unspecified osteoarthritis, unspecified site: Secondary | ICD-10-CM | POA: Diagnosis not present

## 2020-11-06 ENCOUNTER — Ambulatory Visit (INDEPENDENT_AMBULATORY_CARE_PROVIDER_SITE_OTHER): Payer: Medicare PPO | Admitting: Family Medicine

## 2020-11-06 ENCOUNTER — Encounter: Payer: Self-pay | Admitting: Family Medicine

## 2020-11-06 VITALS — BP 100/60 | HR 60 | Temp 97.9°F | Resp 18 | Ht 63.0 in | Wt 120.8 lb

## 2020-11-06 DIAGNOSIS — E785 Hyperlipidemia, unspecified: Secondary | ICD-10-CM | POA: Diagnosis not present

## 2020-11-06 DIAGNOSIS — Z0000 Encounter for general adult medical examination without abnormal findings: Secondary | ICD-10-CM | POA: Diagnosis not present

## 2020-11-06 DIAGNOSIS — I10 Essential (primary) hypertension: Secondary | ICD-10-CM | POA: Diagnosis not present

## 2020-11-06 DIAGNOSIS — Z23 Encounter for immunization: Secondary | ICD-10-CM

## 2020-11-06 LAB — COMPREHENSIVE METABOLIC PANEL
ALT: 29 U/L (ref 0–35)
AST: 20 U/L (ref 0–37)
Albumin: 4.3 g/dL (ref 3.5–5.2)
Alkaline Phosphatase: 79 U/L (ref 39–117)
BUN: 9 mg/dL (ref 6–23)
CO2: 30 mEq/L (ref 19–32)
Calcium: 8.8 mg/dL (ref 8.4–10.5)
Chloride: 100 mEq/L (ref 96–112)
Creatinine, Ser: 0.58 mg/dL (ref 0.40–1.20)
GFR: 93.52 mL/min (ref >60.00)
Glucose, Bld: 93 mg/dL (ref 70–99)
Potassium: 3.7 mEq/L (ref 3.5–5.1)
Sodium: 136 mEq/L (ref 135–145)
Total Bilirubin: 0.6 mg/dL (ref 0.2–1.2)
Total Protein: 6.8 g/dL (ref 6.0–8.3)

## 2020-11-06 LAB — CBC WITH DIFFERENTIAL/PLATELET
Basophils Absolute: 0.1 K/uL (ref 0.0–0.1)
Basophils Relative: 1.3 % (ref 0.0–3.0)
Eosinophils Absolute: 0.2 K/uL (ref 0.0–0.7)
Eosinophils Relative: 5 % (ref 0.0–5.0)
HCT: 38.4 % (ref 36.0–46.0)
Hemoglobin: 13 g/dL (ref 12.0–15.0)
Lymphocytes Relative: 33.7 % (ref 12.0–46.0)
Lymphs Abs: 1.3 K/uL (ref 0.7–4.0)
MCHC: 33.8 g/dL (ref 30.0–36.0)
MCV: 88.7 fl (ref 78.0–100.0)
Monocytes Absolute: 0.3 K/uL (ref 0.1–1.0)
Monocytes Relative: 8.1 % (ref 3.0–12.0)
Neutro Abs: 2.1 K/uL (ref 1.4–7.7)
Neutrophils Relative %: 51.9 % (ref 43.0–77.0)
Platelets: 199 K/uL (ref 150.0–400.0)
RBC: 4.32 Mil/uL (ref 3.87–5.11)
RDW: 13.1 % (ref 11.5–15.5)
WBC: 4 K/uL (ref 4.0–10.5)

## 2020-11-06 LAB — LIPID PANEL
Cholesterol: 131 mg/dL (ref 0–200)
HDL: 66.9 mg/dL (ref >39.00)
LDL Cholesterol: 52 mg/dL (ref 0–99)
NonHDL: 64.58
Total CHOL/HDL Ratio: 2
Triglycerides: 65 mg/dL (ref 0.0–149.0)
VLDL: 13 mg/dL (ref 0.0–40.0)

## 2020-11-06 NOTE — Progress Notes (Signed)
Subjective:   By signing my name below, I, [name], attest that this documentation has been prepared under the direction and in the presence of Dr. Roma Schanz, DO. 11/06/2020    Patient ID: [name], female    DOB: 1955-11-12, 65 y.o.   MRN: [id number]  Chief Complaint  Patient presents with   Annual Exam    Pt states fasting     HPI Patient is in today for a comprehensive physical exam.  Her previous gynecologist is retired. She is tolerating her medication well. No refills needed today. Her blood pressure at office is stable.  BP Readings from Last 3 Encounters:  11/06/20 100/60  10/09/20 118/70  08/06/20 120/76   She is interested in getting flu vaccine today. She is willing to get Covid-19 new booster. She denies having any cough, congestion, joint pain, muscle pain, N/V/D, fever, skin moles, headaches, palpitations, chest pain, or frequency at this time.    Past Medical History:  Diagnosis Date   Allergy    Cataract    Chronic cough 01/24/2019   Costochondritis 01/24/2019   Epigastric pain 07/29/2016   FIBROIDS, UTERUS 01/18/2007   Qualifier: Diagnosis of  By: Jerold Coombe     H/O Bell's palsy 05/2010   HYPERTENSION 03/24/2007   Qualifier: Diagnosis of  By: Ronnald Ramp CMA, Chemira     MENORRHAGIA, PERIMENOPAUSAL 01/13/2007   Qualifier: Diagnosis of  By: Jerold Coombe     MOLE 05/07/2010   Qualifier: Diagnosis of  By: Jerold Coombe     Osteopenia 08/23/2014   RUQ pain 07/29/2016   Spontaneous pneumothorax 2005   R, s/p tube, then VATS   Spontaneous tension pneumothorax 03/24/2007   Qualifier: Diagnosis of  By: Jerold Coombe      Past Surgical History:  Procedure Laterality Date   BRONCHIAL WASHINGS  08/06/2019   Procedure: BRONCHIAL lavage;  Surgeon: Brand Males, MD;  Location: [facility];  Service: Endoscopy;;   CESAREAN SECTION     CHEST TUBE INSERTION  2005   spontaneus Pneumothorax   COLONOSCOPY     SPIROMETRY   01/20/2015   Dr. Donneta Romberg   VIDEO ASSISTED THORACOSCOPY  2005   VIDEO BRONCHOSCOPY N/A 08/06/2019   Procedure: VIDEO BRONCHOSCOPY WITHOUT FLUORO;  Surgeon: Brand Males, MD;  Location: [facility];  Service: Endoscopy;  Laterality: N/A;   WISDOM TOOTH EXTRACTION      Family History  Problem Relation Age of Onset   Hypertension Mother    Stroke Mother    Heart disease Father    Hypertension Father    Cancer Maternal Grandmother 35       breast   Diabetes Maternal Grandmother    Colon cancer Neg Hx    Esophageal cancer Neg Hx    Rectal cancer Neg Hx    Stomach cancer Neg Hx     Social History   Socioeconomic History   Marital status: Married    Spouse name: Not on file   Number of children: 3   Years of education: Not on file   Highest education level: Not on file  Occupational History   Occupation: [employer]--[employer]    Employer: [employer]  Tobacco Use   Smoking status: Former    Packs/day: 1.00    Years: 14.00    Pack years: 14.00    Types: Cigarettes    Quit date: 07/11/1982    Years since quitting: 38.3   Smokeless tobacco: Never  Vaping  Use   Vaping Use: Never used  Substance and Sexual Activity   Alcohol use: Yes    Alcohol/week: 0.0 standard drinks    Comment: rare   Drug use: No   Sexual activity: Yes    Partners: Male    Birth control/protection: Post-menopausal    Comment: 1st intercourse 65 yo-Fewer than 5 partners  Other Topics Concern   Not on file  Social History Narrative   Exercise-- jazzersize  3x a week    Social Determinants of Health   Financial Resource Strain: Not on file  Food Insecurity: Not on file  Transportation Needs: Not on file  Physical Activity: Not on file  Stress: Not on file  Social Connections: Not on file  Intimate Partner Violence: Not on file    Outpatient Medications Prior to Visit  Medication Sig Dispense Refill   acetaminophen (TYLENOL) 500 MG tablet Take 500 mg by mouth  every 6 (six) hours as needed for moderate pain or headache.     albuterol (VENTOLIN HFA) 108 (90 Base) MCG/ACT inhaler Inhale 1-2 puffs into the lungs every 6 (six) hours as needed for wheezing or shortness of breath.     alendronate (FOSAMAX) 70 MG tablet Take 1 tablet (70 mg total) by mouth every 7 (seven) days. Take with a full glass of water on an empty stomach. 12 tablet 3   atorvastatin (LIPITOR) 20 MG tablet TAKE 1 TABLET(20 MG) BY MOUTH DAILY 90 tablet 3   azelastine (ASTELIN) 0.1 % nasal spray Place 1 spray into both nostrils 2 (two) times daily.  3   Calcium Carb-Cholecalciferol (CALCIUM 600 + D PO) Take 1 tablet by mouth daily.     fluticasone (FLONASE) 50 MCG/ACT nasal spray Place 1 spray into both nostrils daily.     gabapentin (NEURONTIN) 300 MG capsule 300 mg daily x 7 days; 300 mg BID x 7 days, 300 mg TID thereafter. 90 capsule 1   levocetirizine (XYZAL) 5 MG tablet Take 5 mg by mouth every evening.      montelukast (SINGULAIR) 10 MG tablet Take 10 mg by mouth at bedtime.     spironolactone (ALDACTONE) 25 MG tablet Take 1 tablet (25 mg total) by mouth daily. 90 tablet 3   valACYclovir (VALTREX) 500 MG tablet Take 1 tablet po bid x 5 days as needed for outbreak 30 tablet 1   valsartan (DIOVAN) 160 MG tablet Take 1 tablet (160 mg total) by mouth daily. 90 tablet 3   Facility-Administered Medications Prior to Visit  Medication Dose Route Frequency Provider Last Rate Last Admin   gi cocktail (Maalox,Lidocaine,Donnatal)  30 mL Oral Once Carollee Herter, Kendrick Fries R, DO        No Known Allergies  Review of Systems  Constitutional:  Negative for fever.  HENT:  Negative for congestion.   Respiratory:  Negative for cough.   Cardiovascular:  Negative for chest pain and palpitations.  Gastrointestinal:  Negative for diarrhea, nausea and vomiting.  Genitourinary:  Negative for frequency.  Musculoskeletal:  Negative for joint pain and myalgias.  Skin:        (-)New moles  Neurological:   Negative for headaches.      Objective:    Physical Exam Constitutional:      General: She is not in acute distress.    Appearance: Normal appearance. She is not ill-appearing.  HENT:     Head: Normocephalic and atraumatic.     Right Ear: Tympanic membrane, ear canal and external ear normal.  Left Ear: Tympanic membrane, ear canal and external ear normal.  Eyes:     Extraocular Movements: Extraocular movements intact.     Pupils: Pupils are equal, round, and reactive to light.  Cardiovascular:     Rate and Rhythm: Normal rate and regular rhythm.     Pulses: Normal pulses.     Heart sounds: Normal heart sounds. No murmur heard.   No gallop.  Pulmonary:     Effort: Pulmonary effort is normal.     Breath sounds: Normal breath sounds.  Abdominal:     General: There is no distension.     Palpations: Abdomen is soft.     Tenderness: There is no abdominal tenderness. There is no guarding.  Lymphadenopathy:     Cervical: No cervical adenopathy.  Skin:    General: Skin is warm and dry.  Neurological:     Mental Status: She is alert and oriented to person, place, and time.  Psychiatric:        Behavior: Behavior normal.        Judgment: Judgment normal.    BP 100/60 (BP Location: Right Arm, Patient Position: Sitting, Cuff Size: Normal)   Pulse 60   Temp 97.9 F (36.6 C) (Oral)   Resp 18   Ht '5\' 3"'$  (1.6 m)   Wt 120 lb 12.8 oz (54.8 kg)   SpO2 99%   BMI 21.40 kg/m  Wt Readings from Last 3 Encounters:  11/06/20 120 lb 12.8 oz (54.8 kg)  10/09/20 121 lb (54.9 kg)  08/06/20 117 lb 6.4 oz (53.3 kg)    Diabetic Foot Exam - Simple   No data filed    Lab Results  Component Value Date   WBC 4.9 12/11/2019   HGB 12.4 12/11/2019   HCT 36.5 12/11/2019   PLT 191 12/11/2019   GLUCOSE 90 05/05/2020   CHOL 131 05/05/2020   TRIG 61.0 05/05/2020   HDL 66.30 05/05/2020   LDLDIRECT 111.7 05/07/2010   LDLCALC 53 05/05/2020   ALT 17 05/05/2020   AST 14 05/05/2020   NA 134  (L) 05/05/2020   K 4.0 05/05/2020   CL 98 05/05/2020   CREATININE 0.52 05/05/2020   BUN 11 05/05/2020   CO2 30 05/05/2020   TSH 0.50 10/21/2017   MICROALBUR 0.2 02/21/2014    Lab Results  Component Value Date   TSH 0.50 10/21/2017   Lab Results  Component Value Date   WBC 4.9 12/11/2019   HGB 12.4 12/11/2019   HCT 36.5 12/11/2019   MCV 89.0 12/11/2019   PLT 191 12/11/2019   Lab Results  Component Value Date   NA 134 (L) 05/05/2020   K 4.0 05/05/2020   CO2 30 05/05/2020   GLUCOSE 90 05/05/2020   BUN 11 05/05/2020   CREATININE 0.52 05/05/2020   BILITOT 0.5 05/05/2020   ALKPHOS 111 05/05/2020   AST 14 05/05/2020   ALT 17 05/05/2020   PROT 6.9 05/05/2020   ALBUMIN 4.4 05/05/2020   CALCIUM 9.3 05/05/2020   ANIONGAP 11 12/11/2019   GFR 96.36 05/05/2020   Lab Results  Component Value Date   CHOL 131 05/05/2020   Lab Results  Component Value Date   HDL 66.30 05/05/2020   Lab Results  Component Value Date   LDLCALC 53 05/05/2020   Lab Results  Component Value Date   TRIG 61.0 05/05/2020   Lab Results  Component Value Date   CHOLHDL 2 05/05/2020   No results found for: HGBA1C  Mammogram- Last  completed 07/23/2020. Results are normal. Repeat in 1 year.  Dexa- Last completed 07/18/2019. Results showed osteoporosis. Repeat in 2 years.  Colonoscopy- Last completed 10/26/2017. Results showed diverticulosis in sigmoid colon, non-bleeding internal hemorrhoids, otherwise results are normal. Repeat in 10 years.  Pap smear- Last completed 10/18/2016. Results are normal.      Assessment & Plan:   Problem List Items Addressed This Visit       Unprioritized   Hyperlipidemia    Encourage heart healthy diet such as MIND or DASH diet, increase exercise, avoid trans fats, simple carbohydrates and processed foods, consider a krill or fish or flaxseed oil cap daily.       Relevant Orders   Lipid panel   Comprehensive metabolic panel   CBC with Differential/Platelet    Preventative health care - Primary    ghm utd Check labs  See avs      Relevant Orders   Lipid panel   Comprehensive metabolic panel   CBC with Differential/Platelet   Primary hypertension    Well controlled, no changes to meds. Encouraged heart healthy diet such as the DASH diet and exercise as tolerated.       Other Visit Diagnoses     Need for influenza vaccination       Relevant Orders   Flu Vaccine QUAD High Dose(Fluad) (Completed)        No orders of the defined types were placed in this encounter.   I, Dr. Roma Schanz, DO, personally preformed the services described in this documentation.  All medical record entries made by the scribe were at my direction and in my presence.  I have reviewed the chart and discharge instructions (if applicable) and agree that the record reflects my personal performance and is accurate and complete. 11/06/2020   I,[name],acting as a scribe for Ann Held, DO.,have documented all relevant documentation on the behalf of Ann Held, DO,as directed by  Ann Held, DO while in the presence of Ann Held, DO.   Ann Held, DO

## 2020-11-06 NOTE — Assessment & Plan Note (Signed)
Encourage heart healthy diet such as MIND or DASH diet, increase exercise, avoid trans fats, simple carbohydrates and processed foods, consider a krill or fish or flaxseed oil cap daily.  °

## 2020-11-06 NOTE — Patient Instructions (Signed)
Preventive Care 40 Years and Older, Female Preventive care refers to lifestyle choices and visits with your health care provider that can promote health and wellness. This includes: A yearly physical exam. This is also called an annual wellness visit. Regular dental and eye exams. Immunizations. Screening for certain conditions. Healthy lifestyle choices, such as: Eating a healthy diet. Getting regular exercise. Not using drugs or products that contain nicotine and tobacco. Limiting alcohol use. What can I expect for my preventive care visit? Physical exam Your health care provider will check your: Height and weight. These may be used to calculate your BMI (body mass index). BMI is a measurement that tells if you are at a healthy weight. Heart rate and blood pressure. Body temperature. Skin for abnormal spots. Counseling Your health care provider may ask you questions about your: Past medical problems. Family's medical history. Alcohol, tobacco, and drug use. Emotional well-being. Home life and relationship well-being. Sexual activity. Diet, exercise, and sleep habits. History of falls. Memory and ability to understand (cognition). Work and work Statistician. Pregnancy and menstrual history. Access to firearms. What immunizations do I need? Vaccines are usually given at various ages, according to a schedule. Your health care provider will recommend vaccines for you based on your age, medical history, and lifestyle or other factors, such as travel or where you work. What tests do I need? Blood tests Lipid and cholesterol levels. These may be checked every 5 years, or more often depending on your overall health. Hepatitis C test. Hepatitis B test. Screening Lung cancer screening. You may have this screening every year starting at age 33 if you have a 30-pack-year history of smoking and currently smoke or have quit within the past 15 years. Colorectal cancer screening. All  adults should have this screening starting at age 73 and continuing until age 9. Your health care provider may recommend screening at age 31 if you are at increased risk. You will have tests every 1-10 years, depending on your results and the type of screening test. Diabetes screening. This is done by checking your blood sugar (glucose) after you have not eaten for a while (fasting). You may have this done every 1-3 years. Mammogram. This may be done every 1-2 years. Talk with your health care provider about how often you should have regular mammograms. Abdominal aortic aneurysm (AAA) screening. You may need this if you are a current or former smoker. BRCA-related cancer screening. This may be done if you have a family history of breast, ovarian, tubal, or peritoneal cancers. Other tests STD (sexually transmitted disease) testing, if you are at risk. Bone density scan. This is done to screen for osteoporosis. You may have this done starting at age 43. Talk with your health care provider about your test results, treatment options, and if necessary, the need for more tests. Follow these instructions at home: Eating and drinking  Eat a diet that includes fresh fruits and vegetables, whole grains, lean protein, and low-fat dairy products. Limit your intake of foods with high amounts of sugar, saturated fats, and salt. Take vitamin and mineral supplements as recommended by your health care provider. Do not drink alcohol if your health care provider tells you not to drink. If you drink alcohol: Limit how much you have to 0-1 drink a day. Be aware of how much alcohol is in your drink. In the [place] one drink equals one 12 oz bottle of beer (355 mL), one 5 oz glass of wine (148 mL), or one  1 oz glass of hard liquor (44 mL). Lifestyle Take daily care of your teeth and gums. Brush your teeth every morning and night with fluoride toothpaste. Floss one time each day. Stay active. Exercise for at least  30 minutes 5 or more days each week. Do not use any products that contain nicotine or tobacco, such as cigarettes, e-cigarettes, and chewing tobacco. If you need help quitting, ask your health care provider. Do not use drugs. If you are sexually active, practice safe sex. Use a condom or other form of protection in order to prevent STIs (sexually transmitted infections). Talk with your health care provider about taking a low-dose aspirin or statin. Find healthy ways to cope with stress, such as: Meditation, yoga, or listening to music. Journaling. Talking to a trusted person. Spending time with friends and family. Safety Always wear your seat belt while driving or riding in a vehicle. Do not drive: If you have been drinking alcohol. Do not ride with someone who has been drinking. When you are tired or distracted. While texting. Wear a helmet and other protective equipment during sports activities. If you have firearms in your house, make sure you follow all gun safety procedures. What's next? Visit your health care provider once a year for an annual wellness visit. Ask your health care provider how often you should have your eyes and teeth checked. Stay up to date on all vaccines. This information is not intended to replace advice given to you by your health care provider. Make sure you discuss any questions you have with your health care provider. Document Revised: 04/18/2020 Document Reviewed: 02/02/2018 Elsevier Patient Education  2022 [employer].

## 2020-11-06 NOTE — Assessment & Plan Note (Signed)
Well controlled, no changes to meds. Encouraged heart healthy diet such as the DASH diet and exercise as tolerated.  °

## 2020-11-06 NOTE — Assessment & Plan Note (Signed)
ghm utd Check labs  See avs  

## 2020-11-13 ENCOUNTER — Ambulatory Visit: Payer: Medicare PPO | Admitting: Internal Medicine

## 2020-11-13 ENCOUNTER — Other Ambulatory Visit: Payer: Self-pay

## 2020-11-13 ENCOUNTER — Encounter: Payer: Self-pay | Admitting: Internal Medicine

## 2020-11-13 VITALS — BP 108/70 | HR 63 | Temp 98.0°F | Ht 63.0 in | Wt 122.2 lb

## 2020-11-13 DIAGNOSIS — R053 Chronic cough: Secondary | ICD-10-CM

## 2020-11-13 DIAGNOSIS — J387 Other diseases of larynx: Secondary | ICD-10-CM | POA: Diagnosis not present

## 2020-11-13 DIAGNOSIS — R918 Other nonspecific abnormal finding of lung field: Secondary | ICD-10-CM | POA: Diagnosis not present

## 2020-11-13 DIAGNOSIS — Z87891 Personal history of nicotine dependence: Secondary | ICD-10-CM | POA: Diagnosis not present

## 2020-11-13 NOTE — Progress Notes (Signed)
OV 06/25/2019  Subjective:  Patient ID: [name], female , DOB: 08-25-1955 , age 66 y.o. , MRN: [id number] , ADDRESS: [address] [place] [facility] [address]   06/25/2019 -   Chief Complaint  Patient presents with   Consult    Pt is here due to having a persistent dry cough for years. Pt also is being treated for a pneumothorax. States her cough could be bad at any time of the day.     HPI [name] 65 y.o. -has been referred by Dr. Donneta Romberg allergist for evaluation of chronic cough.  Patient tells me that she has had insidious onset of chronic cough since the 1990s.  Since then it is persistent and is at the same level.  Cough is described as moderate.  Mostly dry except when she has a flareup with an infection or bronchitis.  In 2005 she had a spontaneous right pneumothorax after respiratory flareup.  This was treated initially by tube thoracostomy and then by pleurodesis by Dr. Caffie Pinto according to the patient's history.  Since then she has not had a recurrence of pneumothorax but does continue to have chronic cough.  The cough is definitely made worse by talking a lot or by inhaling fumes such as perfume or any smoke exposure.  She says the cough during this episodes can be so severe that she almost has a whooping sound to it.  This not necessarily any associated shortness of breath.  The cough is mostly dry.  The cough does not wake her up at night.  Severity index is listed below.  Talking does make the cough worse.  Cough relevant history  -Hypertension: She is not on ACE inhibitor but is on ARB valsartan.  This is not associated with cough  -Allergy history: She did have onset of spring allergies in 1990s.  During this time allergy testing showed positive allergies to a few things including mold and grass.  She says repeat allergy testing some years ago by Dr. Donneta Romberg revealed no allergies.  She is not on allergy shots but she is on antihistamines and nasal steroids.  -Sinus  history: She is not had a CT scan of the sinus.  She does not have a significant postnasal drip but she is also on antihistamines.  She has never been evaluated by ENT.  Acid reflux history: She denies any strong acid reflux.  She states in 20 years she is probably taken a few bottles of Tums in total.  She is never seen GI.  However a GI referral is under consideration by Dr. Donneta Romberg  -Chronic refractory cough: She does clear her throat.  - Verbal output hx: she is considered a chatty person by her family. Job was a Pharmacologist at [employer].  During this time she had to talk a lot.  -Imaging: Chest x-ray 2020 personally visualized and is clear.    CXR 01/23/2019  IMPRESSION: Stable postoperative chest with mild pleural thickening bilaterally. No evidence of recurrent pneumothorax or acute cardiopulmonary process.     Electronically Signed   By: Richardean Sale M.D.   On: 01/23/2019 14:44   ROS - per HPI  Results for [name], [name] (MRN [id number]) as of 06/25/2019 09:38  Ref. Range 08/20/2013 09:35 02/21/2014 10:40 08/23/2014 10:30 09/23/2015 16:35 07/29/2016 16:46 10/18/2016 10:06 10/21/2017 10:05 11/03/2018 13:56  Eosinophils Absolute Latest Ref Range: 0.0 - 0.7 K/uL 0.2 0.1 0.1 0.1 0.1 0.1 0.1 0.1   Results for  [name], [name] (MRN [id number]) as of 06/25/2019 09:38  Ref. Range 03/22/2017 13:54  Influenza A, POC Latest Ref Range: Negative  Positive (A)  Influenza B, POC Latest Ref Range: Negative  Negative      07/04/2019 ov - beth walsh Patient presents today for 10-day follow-up to review CT imaging. She has had a chronic cough for a long times. She takes montelukast and Xyzal. Uses maintenance inahler Advair hfa 115-21 twice daily and prn albuterol hfa prior to exercise. She states that nothing helps her cough. Triggers include strong scents, tart wine and spicy food. She has post nasal drip symptoms. She gets out of breath with speaking. She has no issues with 45 min  exercise class. No significant shortness of breath. No obvious reflux symptoms. She has a known hypodensity liver stable 2014. She does have some epigastric pain. She has an upcoming apt with GI but may rescheduled until after bronchscopy.   Imaging:  CT showed scattered mild cylindrical bronchiectasis mid lungs with associated mild patchy tree-in-bud opacities and mild regions of subpleural scarring.  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  Few scattered solid pulmonary nodules in both lungs largest measuring 6 mm right lower lobe. 3. Two-vessel coronary atherosclerosis. 4. Aortic Atherosclerosis (ICD10-I70.0) and Emphysema   06/30/19 CT maxillofacial - Negative for sinusitis    OV 08/20/2019  Subjective:  Patient ID: [name], female , DOB: [birth year] , age 70 y.o. , MRN: [id number] , ADDRESS: [address] [place] [facility] [address]   08/20/2019 -   Chief Complaint  Patient presents with   Follow-up    still has a dry cough     HPI [name] 65 y.o. -video visit for chronic cough.  This is still follow-up with bronchoscopy results.  She had bronchoscopy mid June 2021.  She had normal airway exam.  Right middle lobe lavage shows neutrophilia.  She is growing Candida albicans.  She is not immunocompromise per her history.  On this video visit she is joined by her husband.  Husband and she report that she coughs early in the morning.  She has seen Dr. Benson Norway of GI who is advised expectant approach.  Certainly he will offer more testing according to history if her symptoms persist.  Medication review shows she is on Advair MDI and also she is taking fish oil.  She says she been taking fish oil for many years.  She has never been on gabapentin but is willing to try this in the future.  She is aware of the concept of neurogenic cough.    Dr Lorenza Cambridge Reflux Symptom Index (> 13-15 suggestive of LPR cough)  06/25/2019   Hoarseness of problem with voice 3   Clearing  Of Throat 1  Excess throat mucus or feeling of post nasal drip 1  Difficulty swallowing food, liquid or tablets 0  Cough after eating or lying down 2  Breathing difficulties or choking episodes 3  Troublesome or annoying cough 5  Sensation of something sticking in throat or lump in throat 2  Heartburn, chest pain, indigestion, or stomach acid coming up 1  TOTAL 17     Results for [name], [name] (MRN [id number]) as of 08/20/2019 09:24  Ref. Range 08/06/2019 07:28  Monocyte-Macrophage-Serous Fluid Latest Ref Range: 50 - 90 % 28 (L)  Other Cells, Fluid Latest Units: % CORRELATE WITH CYTOLOGY.  Fluid Type-FCT Unknown Bronch Lavag  Color, Fluid Latest Ref Range: YELLOW  COLORLESS (A)  Total  Nucleated Cell Count, Fluid Latest Ref Range: 0 - 1,000 cu mm 325  Lymphs, Fluid Latest Units: % 5  Appearance, Fluid Latest Ref Range: CLEAR  HAZY (A)  Neutrophil Count, Fluid Latest Ref Range: 0 - 25 % 67 (H)    Component 2 wk ago  Fungal result 1 Candida albicans Abnormal    AFB Specimen Processing Concentration   Acid Fast Smear Negative     OV 10/23/2019  Subjective:  Patient ID: [name], female , DOB: 05-14-1955 , age 61 y.o. , MRN: [id number] , ADDRESS: [address] [place] [address]   10/23/2019 -   Chief Complaint  Patient presents with   Follow-up    Chronic cough     HPI [name] 65 y.o. -returns for follow-up of her chronic cough.  After her last visit bronchoalveolar lavage cultures continue to be negative other than Candida as above.  She thinks the Candida might have been due to the different steroid inhaler she is taking in the past.  Most recently she has been on Advair for 6 months.  She notices no difference because of it.  She continues to have a cough with clearing of the throat.  She states ice helps continue to soothe her cough.  She says it is random.  She is clearing her throat.  Stopping fish oil has not helped her.  Taking 1 week of  Diflucan has not helped her.  She wants to try other interventions to get rid of her symptoms.  However on the symptom questionnaire she is some better which she later admitted to that she was some better but not fully better.     OV 11/13/2020  Subjective:  Patient ID: [name], female , DOB: 1955-12-04 , age 18 y.o. , MRN: [id number] , ADDRESS: [address] [place] [address] PCP Carollee Herter, Alferd Apa, DO Patient Care Team: Carollee Herter, Alferd Apa, DO as PCP - General Sueanne Margarita, MD as PCP - Cardiology (Cardiology) Mosetta Anis, MD as Referring Physician (Allergy) Brand Males, MD as Consulting Physician (Pulmonary Disease) Jari Pigg, MD as Consulting Physician (Dermatology)  This Provider for this visit: Treatment Team:  Attending Provider: Brand Males, MD    11/13/2020 -   Chief Complaint  Patient presents with   Follow-up    Pt states her cough has been doing better with being on gabapentin.   Follow-up chronic cough Follow-up lung nodule multiple with largest 8 mm and right lower lobe May 2022 [stable through November 2021] Former history of smoking  HPI [name] 65 y.o. -returns for follow-up.  At this point she had called over a month ago saying cough is back.  We prescribed her gabapentin and the cough is almost resolved.  She feels gabapentin is helping her.  She seen Dr. Donneta Romberg and apparently he has ruled out allergic rhinitis but she continues on Xyzal and Singulair.  She is not on any inhalers.  Last visit I told her to stop her fish oil which she has.  This visit I noticed that she is on Fosamax.  This can cause acid reflux.  We will give her instructions to talk to primary care and stop Fosamax.  Overall she is feeling well.  Her most recent CT chest was in May 2022 with multiple nodules with the largest being 8 mm right lower lobe.  That was stable since November 2021.  Radiologist recommending CT scan in 18 months from May 2022  which would be end of 2023 but given history of smoking I will probably get it sooner.      Dr Lorenza Cambridge Reflux Symptom Index (> 13-15 suggestive of LPR cough)  06/25/2019  10/23/2019  11/13/2020 gabepentin  Hoarseness of problem with voice 3 2 0  Clearing  Of Throat 1 2 0  Excess throat mucus or feeling of post nasal drip 1 1 0  Difficulty swallowing food, liquid or tablets 0 0 0  Cough after eating or lying down 2 0 0  Breathing difficulties or choking episodes 3 2 0  Troublesome or annoying cough _0 Sensation of something sticking in throat or lump in throat 2 0 0  Heartburn, chest pain, indigestion, or stomach acid coming up 1 0 0  TOTAL _1 PFT  PFT Results Latest Ref Rng & Units 08/02/2019  FVC-Pre L 2.76  FVC-Predicted Pre % 91  FVC-Post L 2.87  FVC-Predicted Post % 95  Pre FEV1/FVC % % 79  Post FEV1/FCV % % 79  FEV1-Pre L 2.19  FEV1-Predicted Pre % 95  FEV1-Post L 2.25  DLCO uncorrected ml/min/mmHg 19.12  DLCO UNC% % 100  DLCO corrected ml/min/mmHg 19.12  DLCO COR %Predicted % 100  DLVA Predicted % 102  TLC L 4.87  TLC % Predicted % 99  RV % Predicted % 102     CLINICAL DATA:  Bronchiectasis with lung nodules.   EXAM: CT CHEST WITHOUT CONTRAST   TECHNIQUE: Multidetector CT imaging of the chest was performed following the standard protocol without IV contrast.   COMPARISON:  12/24/2019   FINDINGS: Cardiovascular: The heart size is normal. No substantial pericardial effusion. Atherosclerotic calcification is noted in the wall of the thoracic aorta.   Mediastinum/Nodes: No mediastinal lymphadenopathy. No evidence for gross hilar lymphadenopathy although assessment is limited by the lack of intravenous contrast on today's study. The esophagus has normal imaging features. There is no axillary lymphadenopathy.   Lungs/Pleura: Biapical pleuroparenchymal scarring evident with stable appearance of the right apical staple line. 3 mm nodule  in the medial right apex measured previously is stable when remeasured in the same dimension today. Previously measured 3 mm anterior right upper lobe pulmonary nodule has resolved in the interval. 8 x 4 mm right lower lobe nodule on 83/8 was 7 x 5 mm previously, unchanged. Several additional scattered tiny pulmonary nodules are stable. No new suspicious pulmonary nodule or mass. No focal airspace consolidation. No pleural effusion.   Upper Abdomen: Unremarkable.   Musculoskeletal: No worrisome lytic or sclerotic osseous abnormality.   IMPRESSION: 1. Interval resolution of the 3 mm anterior right upper lobe pulmonary nodule. Other scattered tiny bilateral pulmonary nodules are stable in the interval including the 8 x 4 mm right lower lobe nodule. CT at 18-24 months (from today's scan) is considered optional for low-risk patients, but is recommended for high-risk patients. This recommendation follows the consensus statement: Guidelines for Management of Incidental Pulmonary Nodules Detected on CT Images: From the Fleischner Society 2017; Radiology 2017; 284:228-243. 2. No new suspicious pulmonary nodule or mass. 3. Aortic Atherosclerosis (ICD10-I70.0).     Electronically Signed   By: Misty Stanley M.D.   On: 07/02/2020 10:05  has a past medical history of Allergy, Cataract, Chronic cough (01/24/2019), Costochondritis (01/24/2019), Epigastric pain (07/29/2016), FIBROIDS, UTERUS (01/18/2007), H/O Bell's palsy (05/2010), HYPERTENSION (03/24/2007), MENORRHAGIA, PERIMENOPAUSAL (01/13/2007), MOLE (05/07/2010), Osteopenia (08/23/2014), RUQ pain (07/29/2016), Spontaneous pneumothorax (2005), and Spontaneous tension pneumothorax (03/24/2007).  reports that she quit smoking about 38 years ago. Her smoking use included cigarettes. She has a 14.00 pack-year smoking history. She has never used smokeless tobacco.  Past Surgical History:  Procedure Laterality Date   BRONCHIAL WASHINGS   08/06/2019   Procedure: BRONCHIAL lavage;  Surgeon: Brand Males, MD;  Location: [facility];  Service: Endoscopy;;   CESAREAN SECTION     CHEST TUBE INSERTION  2005   spontaneus Pneumothorax   COLONOSCOPY     SPIROMETRY  01/20/2015   Dr. Donneta Romberg   VIDEO ASSISTED THORACOSCOPY  2005   VIDEO BRONCHOSCOPY N/A 08/06/2019   Procedure: VIDEO BRONCHOSCOPY WITHOUT FLUORO;  Surgeon: Brand Males, MD;  Location: [facility];  Service: Endoscopy;  Laterality: N/A;   WISDOM TOOTH EXTRACTION      No Known Allergies  Immunization History  Administered Date(s) Administered   DTaP 02/03/2019   Fluad Quad(high Dose 65+) 11/03/2018, 11/06/2020   Influenza Whole 11/30/2006, 12/13/2007, 01/01/2009, 12/31/2009   Influenza,inj,Quad PF,6+ Mos 11/27/2017   Influenza-Unspecified 10/24/2013, 11/28/2016, 11/23/2019   PFIZER(Purple Top)SARS-COV-2 Vaccination 03/31/2019, 04/25/2019, 11/23/2019, 09/01/2020   Pneumococcal Conjugate-13 11/03/2018   Pneumococcal Polysaccharide-23 11/05/2019   Td 08/28/2002, 04/18/2008   Tdap 01/24/2019   Zoster Recombinat (Shingrix) 11/28/2016, 02/04/2017   Zoster, Live 08/20/2013    Family History  Problem Relation Age of Onset   Hypertension Mother    Stroke Mother    Heart disease Father    Hypertension Father    Cancer Maternal Grandmother 102       breast   Diabetes Maternal Grandmother    Colon cancer Neg Hx    Esophageal cancer Neg Hx    Rectal cancer Neg Hx    Stomach cancer Neg Hx      Current Outpatient Medications:    acetaminophen (TYLENOL) 500 MG tablet, Take 500 mg by mouth every 6 (six) hours as needed for moderate pain or headache., Disp: , Rfl:    albuterol (VENTOLIN HFA) 108 (90 Base) MCG/ACT inhaler, Inhale 1-2 puffs into the lungs every 6 (six) hours as needed for wheezing or shortness of breath., Disp: , Rfl:    alendronate (FOSAMAX) 70 MG tablet, Take 1 tablet (70 mg total) by mouth every 7 (seven) days. Take with a full glass of water  on an empty stomach., Disp: 12 tablet, Rfl: 3   atorvastatin (LIPITOR) 20 MG tablet, TAKE 1 TABLET(20 MG) BY MOUTH DAILY, Disp: 90 tablet, Rfl: 3   azelastine (ASTELIN) 0.1 % nasal spray, Place 1 spray into both nostrils 2 (two) times daily., Disp: , Rfl: 3   Calcium Carb-Cholecalciferol (CALCIUM 600 + D PO), Take 1 tablet by mouth daily., Disp: , Rfl:    fluticasone (FLONASE) 50 MCG/ACT nasal spray, Place 1 spray into both nostrils daily., Disp: , Rfl:    gabapentin (NEURONTIN) 300 MG capsule, 300 mg daily x 7 days; 300 mg BID x 7 days, 300 mg TID thereafter., Disp: 90 capsule, Rfl: 1   levocetirizine (XYZAL) 5 MG tablet, Take 5 mg by mouth every evening. , Disp: , Rfl:    montelukast (SINGULAIR) 10 MG tablet, Take 10 mg by mouth at bedtime., Disp: , Rfl:    spironolactone (ALDACTONE) 25 MG tablet, Take 1 tablet (25 mg total) by mouth daily., Disp: 90 tablet, Rfl: 3   valACYclovir (VALTREX) 500 MG tablet, Take 1 tablet po bid x 5 days as needed for outbreak, Disp: 30 tablet, Rfl: 1   valsartan (DIOVAN) 160 MG tablet, Take 1 tablet (160 mg  total) by mouth daily., Disp: 90 tablet, Rfl: 3  Current Facility-Administered Medications:    gi cocktail (Maalox,Lidocaine,Donnatal), 30 mL, Oral, Once, [employer], Yvonne R, DO      Objective:   Vitals:   11/13/20 1416  BP: 108/70  Pulse: 63  Temp: 98 F (36.7 C)  TempSrc: Oral  SpO2: 100%  Weight: 122 lb 3.2 oz (55.4 kg)  Height: _0  (1.6 m)    Estimated body mass index is 21.65 kg/m as calculated from the following:   Height as of this encounter: _1  (1.6 m).   Weight as of this encounter: 122 lb 3.2 oz (55.4 kg).  _2 @  Filed Weights   11/13/20 1416  Weight: 122 lb 3.2 oz (55.4 kg)     Physical Exam  General: No distress. Look swell Neuro: Alert and Oriented x 3. GCS 15. Speech normal Psych: Pleasant Resp:  Barrel Chest - no.  Wheeze - no, Crackles - no, No overt respiratory distress CVS: Normal heart sounds.  Murmurs - no Ext: Stigmata of Connective Tissue Disease - no HEENT: Normal upper airway. PEERL +. post nasal drip+        Assessment:       ICD-10-CM   1. Chronic cough  R05.3     2. Irritable larynx  J38.7     3. Lung nodules  R91.8     4. History of cigarette smoking  Z87.891          Plan:     Patient Instructions     ICD-10-CM   1. Chronic cough  R05.3     2. Irritable larynx  J38.7     3. Lung nodules  R91.8     4. History of cigarette smoking  Z87.891      Your cough neuropathy   You also have  rhinitis for which you are on Xyzal and Singulair management Dr. Donneta Romberg  Cough significantly improved after restart of gabapentin full dose in August 2022  -Noted you are on Fosamax which can cause acid reflux which can in turn cause cough   Lung nodules stable November 2021 through May 2022 [largest nodule 8 mm right lower lobe]   Plan -Talk to primary care physician and stop Fosamax -Continue gabapentin at full dose for the next few months until we specify otherwise -Avoid getting rhinitis or respiratory infection which can then make your cough worse -Do CT scan of the chest without contrast in 6 months from now -Glad he had the flu shot - Support you having the new bivalent COVID mRNA booster > 3 months from most recent booster  Followup 12  weeks with nurse practitioner or Dr. Chase Caller to report progress return sooner if needed  -RSI cough score at follow-up  -At this visit we will talk about tapering down on gabapentin    SIGNATURE    Dr. Brand Males, M.D., F.C.C.P,  Pulmonary and Critical Care Medicine Staff Physician, [place] Director - Interstitial Lung Disease  Program  [facility] [place] [facility] [place], [place], [address]  Pager: [phone], If no answer or between  15:00h - 7:00h: call [phone] Telephone: [phone]  2:39 PM 11/13/2020

## 2020-11-13 NOTE — Patient Instructions (Addendum)
ICD-10-CM   1. Chronic cough  R05.3     2. Irritable larynx  J38.7     3. Lung nodules  R91.8     4. History of cigarette smoking  Z87.891      Your cough neuropathy   You also have  rhinitis for which you are on Xyzal and Singulair management Dr. Donneta Romberg  Cough significantly improved after restart of gabapentin full dose in August 2022  -Noted you are on Fosamax which can cause acid reflux which can in turn cause cough   Lung nodules stable November 2021 through May 2022 [largest nodule 8 mm right lower lobe]   Plan -Talk to primary care physician and stop Fosamax -Continue gabapentin at full dose for the next few months until we specify otherwise -Avoid getting rhinitis or respiratory infection which can then make your cough worse -Do CT scan of the chest without contrast in 6 months from now -Glad he had the flu shot - Support you having the new bivalent COVID mRNA booster > 3 months from most recent booster  Followup 12  weeks with nurse practitioner or Dr. Chase Caller to report progress return sooner if needed  -RSI cough score at follow-up  -At this visit we will talk about tapering down on gabapentin

## 2020-11-14 NOTE — Telephone Encounter (Signed)
It is not urgent but Fosamax can cause significant acid reflux which can then make cough worse.  Very similar to fish oil.  In fact worsen fish oil.  For chronic cough patients we do not recommend Fosamax.  So there are other alternatives  which she can talk to Dr. Etter Sjogren about

## 2020-11-14 NOTE — Telephone Encounter (Signed)
Received the following message from patient:   "I read over the summary of today's visit and wanted to review your comment about fosamax. Dr. Etter Sjogren prescribed fosamax in March (6 months ago) for my osteoporosis.  She said I needed something in addition to calcium supplements and said we would do a bone density scan to check if fosamax has been beneficial this coming March.  I take it on Tuesday mornings with 8 to 12 oz. of water and have breakfast about two hours later (after my morning exercise class) and so far, there hasn't been any digestive issue.  The other treatment we discussed was Prolia.  Should I still discuss stopping fosamax with Dr. Etter Sjogren?   Thank you. [name] 30-Jan-1956"  MR, can you please advise? Thanks!

## 2020-12-03 ENCOUNTER — Encounter: Payer: Self-pay | Admitting: Family Medicine

## 2020-12-03 NOTE — Telephone Encounter (Signed)
noted 

## 2020-12-08 ENCOUNTER — Other Ambulatory Visit: Payer: Self-pay | Admitting: Internal Medicine

## 2020-12-16 ENCOUNTER — Encounter: Payer: Self-pay | Admitting: Family Medicine

## 2020-12-24 ENCOUNTER — Ambulatory Visit (INDEPENDENT_AMBULATORY_CARE_PROVIDER_SITE_OTHER): Payer: Medicare PPO

## 2020-12-24 VITALS — Ht 63.0 in | Wt 122.0 lb

## 2020-12-24 DIAGNOSIS — Z0000 Encounter for general adult medical examination without abnormal findings: Secondary | ICD-10-CM

## 2020-12-24 NOTE — Patient Instructions (Signed)
[name] , Thank you for taking time to complete your Medicare Wellness Visit. I appreciate your ongoing commitment to your health goals. Please review the following plan we discussed and let me know if I can assist you in the future.   Screening recommendations/referrals: Colonoscopy: Completed 10/26/2017-Due 10/27/2027 Mammogram: Completed 07/23/2020-Due 07/23/2021 Bone Density: Completed 07/18/2019-Due 07/17/2021 Recommended yearly ophthalmology/optometry visit for glaucoma screening and checkup Recommended yearly dental visit for hygiene and checkup  Vaccinations: Influenza vaccine: Up to date Pneumococcal vaccine: Up tod ate Tdap vaccine: Up to date Shingles vaccine: Completed vaccines   Covid-19:Up to date  Advanced directives: Information mailed today  Conditions/risks identified: See problem list  Next appointment: Follow up in one year for your annual wellness visit 12/28/2021 @ 10:20   Preventive Care 65 Years and Older, Female Preventive care refers to lifestyle choices and visits with your health care provider that can promote health and wellness. What does preventive care include? A yearly physical exam. This is also called an annual well check. Dental exams once or twice a year. Routine eye exams. Ask your health care provider how often you should have your eyes checked. Personal lifestyle choices, including: Daily care of your teeth and gums. Regular physical activity. Eating a healthy diet. Avoiding tobacco and drug use. Limiting alcohol use. Practicing safe sex. Taking low-dose aspirin every day. Taking vitamin and mineral supplements as recommended by your health care provider. What happens during an annual well check? The services and screenings done by your health care provider during your annual well check will depend on your age, overall health, lifestyle risk factors, and family history of disease. Counseling  Your health care provider may ask you questions about  your: Alcohol use. Tobacco use. Drug use. Emotional well-being. Home and relationship well-being. Sexual activity. Eating habits. History of falls. Memory and ability to understand (cognition). Work and work Statistician. Reproductive health. Screening  You may have the following tests or measurements: Height, weight, and BMI. Blood pressure. Lipid and cholesterol levels. These may be checked every 5 years, or more frequently if you are over 67 years old. Skin check. Lung cancer screening. You may have this screening every year starting at age 11 if you have a 30-pack-year history of smoking and currently smoke or have quit within the past 15 years. Fecal occult blood test (FOBT) of the stool. You may have this test every year starting at age 55. Flexible sigmoidoscopy or colonoscopy. You may have a sigmoidoscopy every 5 years or a colonoscopy every 10 years starting at age 22. Hepatitis C blood test. Hepatitis B blood test. Sexually transmitted disease (STD) testing. Diabetes screening. This is done by checking your blood sugar (glucose) after you have not eaten for a while (fasting). You may have this done every 1-3 years. Bone density scan. This is done to screen for osteoporosis. You may have this done starting at age 68. Mammogram. This may be done every 1-2 years. Talk to your health care provider about how often you should have regular mammograms. Talk with your health care provider about your test results, treatment options, and if necessary, the need for more tests. Vaccines  Your health care provider may recommend certain vaccines, such as: Influenza vaccine. This is recommended every year. Tetanus, diphtheria, and acellular pertussis (Tdap, Td) vaccine. You may need a Td booster every 10 years. Zoster vaccine. You may need this after age 13. Pneumococcal 13-valent conjugate (PCV13) vaccine. One dose is recommended after age 37. Pneumococcal polysaccharide (PPSV23)  vaccine.  One dose is recommended after age 77. Talk to your health care provider about which screenings and vaccines you need and how often you need them. This information is not intended to replace advice given to you by your health care provider. Make sure you discuss any questions you have with your health care provider. Document Released: 03/07/2015 Document Revised: 10/29/2015 Document Reviewed: 12/10/2014 Elsevier Interactive Patient Education  2017 [employer] Prevention in the Home Falls can cause injuries. They can happen to people of all ages. There are many things you can do to make your home safe and to help prevent falls. What can I do on the outside of my home? Regularly fix the edges of walkways and driveways and fix any cracks. Remove anything that might make you trip as you walk through a door, such as a raised step or threshold. Trim any bushes or trees on the path to your home. Use bright outdoor lighting. Clear any walking paths of anything that might make someone trip, such as rocks or tools. Regularly check to see if handrails are loose or broken. Make sure that both sides of any steps have handrails. Any raised decks and porches should have guardrails on the edges. Have any leaves, snow, or ice cleared regularly. Use sand or salt on walking paths during winter. Clean up any spills in your garage right away. This includes oil or grease spills. What can I do in the bathroom? Use night lights. Install grab bars by the toilet and in the tub and shower. Do not use towel bars as grab bars. Use non-skid mats or decals in the tub or shower. If you need to sit down in the shower, use a plastic, non-slip stool. Keep the floor dry. Clean up any water that spills on the floor as soon as it happens. Remove soap buildup in the tub or shower regularly. Attach bath mats securely with double-sided non-slip rug tape. Do not have throw rugs and other things on the floor that can make  you trip. What can I do in the bedroom? Use night lights. Make sure that you have a light by your bed that is easy to reach. Do not use any sheets or blankets that are too big for your bed. They should not hang down onto the floor. Have a firm chair that has side arms. You can use this for support while you get dressed. Do not have throw rugs and other things on the floor that can make you trip. What can I do in the kitchen? Clean up any spills right away. Avoid walking on wet floors. Keep items that you use a lot in easy-to-reach places. If you need to reach something above you, use a strong step stool that has a grab bar. Keep electrical cords out of the way. Do not use floor polish or wax that makes floors slippery. If you must use wax, use non-skid floor wax. Do not have throw rugs and other things on the floor that can make you trip. What can I do with my stairs? Do not leave any items on the stairs. Make sure that there are handrails on both sides of the stairs and use them. Fix handrails that are broken or loose. Make sure that handrails are as long as the stairways. Check any carpeting to make sure that it is firmly attached to the stairs. Fix any carpet that is loose or worn. Avoid having throw rugs at the top or bottom  of the stairs. If you do have throw rugs, attach them to the floor with carpet tape. Make sure that you have a light switch at the top of the stairs and the bottom of the stairs. If you do not have them, ask someone to add them for you. What else can I do to help prevent falls? Wear shoes that: Do not have high heels. Have rubber bottoms. Are comfortable and fit you well. Are closed at the toe. Do not wear sandals. If you use a stepladder: Make sure that it is fully opened. Do not climb a closed stepladder. Make sure that both sides of the stepladder are locked into place. Ask someone to hold it for you, if possible. Clearly mark and make sure that you can  see: Any grab bars or handrails. First and last steps. Where the edge of each step is. Use tools that help you move around (mobility aids) if they are needed. These include: Canes. Walkers. Scooters. Crutches. Turn on the lights when you go into a dark area. Replace any light bulbs as soon as they burn out. Set up your furniture so you have a clear path. Avoid moving your furniture around. If any of your floors are uneven, fix them. If there are any pets around you, be aware of where they are. Review your medicines with your doctor. Some medicines can make you feel dizzy. This can increase your chance of falling. Ask your doctor what other things that you can do to help prevent falls. This information is not intended to replace advice given to you by your health care provider. Make sure you discuss any questions you have with your health care provider. Document Released: 12/05/2008 Document Revised: 07/17/2015 Document Reviewed: 03/15/2014 Elsevier Interactive Patient Education  2017 [employer].

## 2020-12-24 NOTE — Telephone Encounter (Signed)
Clinical info submitted to insurance company. Waiting on summary of benefits to determine coverage. Will call patient to discuss once received.  

## 2020-12-24 NOTE — Progress Notes (Signed)
Subjective:   [name] is a 65 y.o. female who presents for an Initial Medicare Annual Wellness Visit.  I connected with Mileah today by telephone and verified that I am speaking with the correct person using two identifiers. Location patient: home Location provider: work Persons participating in the virtual visit: patient, Marine scientist.    I discussed the limitations, risks, security and privacy concerns of performing an evaluation and management service by telephone and the availability of in person appointments. I also discussed with the patient that there may be a patient responsible charge related to this service. The patient expressed understanding and verbally consented to this telephonic visit.    Interactive audio and video telecommunications were attempted between this provider and patient, however failed, due to patient having technical difficulties OR patient did not have access to video capability.  We continued and completed visit with audio only.  Some vital signs may be absent or patient reported.   Time Spent with patient on telephone encounter: 25 minutes   Review of Systems     Cardiac Risk Factors include: advanced age (>63men, >22 women);hypertension;dyslipidemia     Objective:    Today's Vitals   12/24/20 1100  Weight: 122 lb (55.3 kg)  Height: 5\' 3"  (1.6 m)   Body mass index is 21.61 kg/m.  Advanced Directives 12/24/2020 08/06/2019 11/03/2018  Does Patient Have a Medical Advance Directive? No Yes Yes  Type of Advance Directive - [place];Living will Living will  Does patient want to make changes to medical advance directive? Yes (MAU/Ambulatory/Procedural Areas - Information given) - -  Copy of [place] in Chart? - Yes - validated most recent copy scanned in chart (See row information) -    Current Medications (verified) Outpatient Encounter Medications as of 12/24/2020  Medication Sig   acetaminophen (TYLENOL) 500  MG tablet Take 500 mg by mouth every 6 (six) hours as needed for moderate pain or headache.   albuterol (VENTOLIN HFA) 108 (90 Base) MCG/ACT inhaler Inhale 1-2 puffs into the lungs every 6 (six) hours as needed for wheezing or shortness of breath.   alendronate (FOSAMAX) 70 MG tablet Take 1 tablet (70 mg total) by mouth every 7 (seven) days. Take with a full glass of water on an empty stomach.   atorvastatin (LIPITOR) 20 MG tablet TAKE 1 TABLET(20 MG) BY MOUTH DAILY   azelastine (ASTELIN) 0.1 % nasal spray Place 1 spray into both nostrils 2 (two) times daily.   Calcium Carb-Cholecalciferol (CALCIUM 600 + D PO) Take 1 tablet by mouth daily.   fluticasone (FLONASE) 50 MCG/ACT nasal spray Place 1 spray into both nostrils daily.   gabapentin (NEURONTIN) 300 MG capsule Take 1 capsule (300 mg total) by mouth 3 (three) times daily.   levocetirizine (XYZAL) 5 MG tablet Take 5 mg by mouth every evening.    montelukast (SINGULAIR) 10 MG tablet Take 10 mg by mouth at bedtime.   spironolactone (ALDACTONE) 25 MG tablet Take 1 tablet (25 mg total) by mouth daily.   valACYclovir (VALTREX) 500 MG tablet Take 1 tablet po bid x 5 days as needed for outbreak   valsartan (DIOVAN) 160 MG tablet Take 1 tablet (160 mg total) by mouth daily.   Facility-Administered Encounter Medications as of 12/24/2020  Medication   gi cocktail (Maalox,Lidocaine,Donnatal)    Allergies (verified) Patient has no known allergies.   History: Past Medical History:  Diagnosis Date   Allergy    Cataract  Chronic cough 01/24/2019   Costochondritis 01/24/2019   Epigastric pain 07/29/2016   FIBROIDS, UTERUS 01/18/2007   Qualifier: Diagnosis of  By: Jerold Coombe     H/O Bell's palsy 05/2010   HYPERTENSION 03/24/2007   Qualifier: Diagnosis of  By: Ronnald Ramp CMA, Chemira     MENORRHAGIA, PERIMENOPAUSAL 01/13/2007   Qualifier: Diagnosis of  By: Jerold Coombe     MOLE 05/07/2010   Qualifier: Diagnosis of  By: Jerold Coombe      Osteopenia 08/23/2014   RUQ pain 07/29/2016   Spontaneous pneumothorax 2005   R, s/p tube, then VATS   Spontaneous tension pneumothorax 03/24/2007   Qualifier: Diagnosis of  By: Jerold Coombe     Past Surgical History:  Procedure Laterality Date   BRONCHIAL WASHINGS  08/06/2019   Procedure: BRONCHIAL lavage;  Surgeon: Brand Males, MD;  Location: [facility];  Service: Endoscopy;;   CESAREAN SECTION     CHEST TUBE INSERTION  2005   spontaneus Pneumothorax   COLONOSCOPY     SPIROMETRY  01/20/2015   Dr. Donneta Romberg   VIDEO ASSISTED THORACOSCOPY  2005   VIDEO BRONCHOSCOPY N/A 08/06/2019   Procedure: VIDEO BRONCHOSCOPY WITHOUT FLUORO;  Surgeon: Brand Males, MD;  Location: [facility];  Service: Endoscopy;  Laterality: N/A;   WISDOM TOOTH EXTRACTION     Family History  Problem Relation Age of Onset   Hypertension Mother    Stroke Mother    Heart disease Father    Hypertension Father    Cancer Maternal Grandmother 6       breast   Diabetes Maternal Grandmother    Colon cancer Neg Hx    Esophageal cancer Neg Hx    Rectal cancer Neg Hx    Stomach cancer Neg Hx    Social History   Socioeconomic History   Marital status: Married    Spouse name: Not on file   Number of children: 3   Years of education: Not on file   Highest education level: Not on file  Occupational History   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    Employer: [employer]  Tobacco Use   Smoking status: Former    Packs/day: 1.00    Years: 14.00    Pack years: 14.00    Types: Cigarettes    Quit date: 07/11/1982    Years since quitting: 38.4   Smokeless tobacco: Never  Vaping Use   Vaping Use: Never used  Substance and Sexual Activity   Alcohol use: Yes    Alcohol/week: 0.0 standard drinks    Comment: rare   Drug use: No   Sexual activity: Yes    Partners: Male    Birth control/protection: Post-menopausal    Comment: 1st intercourse 65 yo-Fewer than 5 partners  Other  Topics Concern   Not on file  Social History Narrative   Exercise-- jazzersize  3x a week    Social Determinants of Radio broadcast assistant Strain: Low Risk    Difficulty of Paying Living Expenses: Not hard at all  Food Insecurity: No Food Insecurity   Worried About Charity fundraiser in the Last Year: Never true   Arboriculturist in the Last Year: Never true  Transportation Needs: No Transportation Needs   Lack of Transportation (Medical): No   Lack of Transportation (Non-Medical): No  Physical Activity: Sufficiently Active   Days of Exercise per Week: 6 days   Minutes of Exercise per Session: 40 min  Stress: No Stress Concern Present   Feeling of Stress : Not at all  Social Connections: Moderately Isolated   Frequency of Communication with Friends and Family: More than three times a week   Frequency of Social Gatherings with Friends and Family: More than three times a week   Attends Religious Services: Never   Marine scientist or Organizations: No   Attends Music therapist: Never   Marital Status: Married    Tobacco Counseling Counseling given: Not Answered   Clinical Intake:  Pre-visit preparation completed: Yes  Pain : No/denies pain     BMI - recorded: 21.61 Nutritional Status: BMI of 19-24  Normal Nutritional Risks: None Diabetes: No  How often do you need to have someone help you when you read instructions, pamphlets, or other written materials from your doctor or pharmacy?: 1 - Never  Diabetic?No  Interpreter Needed?: No  Information entered by :: Caroleen Hamman LPN   Activities of Daily Living In your present state of health, do you have any difficulty performing the following activities: 12/24/2020 11/06/2020  Hearing? N N  Vision? N N  Difficulty concentrating or making decisions? N N  Walking or climbing stairs? N N  Dressing or bathing? N N  Doing errands, shopping? N N  Preparing Food and eating ? N -  Using the  Toilet? N -  In the past six months, have you accidently leaked urine? N -  Do you have problems with loss of bowel control? N -  Managing your Medications? N -  Managing your Finances? N -  Housekeeping or managing your Housekeeping? N -  Some recent data might be hidden    Patient Care Team: Carollee Herter, Alferd Apa, DO as PCP - General Sueanne Margarita, MD as PCP - Cardiology (Cardiology) Mosetta Anis, MD as Referring Physician (Allergy) Brand Males, MD as Consulting Physician (Pulmonary Disease) Jari Pigg, MD as Consulting Physician (Dermatology)  Indicate any recent Medical Services you may have received from other than [facility] providers in the past year (date may be approximate).     Assessment:   This is a routine wellness examination for [name].  Hearing/Vision screen Hearing Screening - Comments:: No issues Vision Screening - Comments:: Last eye exam-09/2020-Dr. Hutto  Dietary issues and exercise activities discussed: Current Exercise Habits: Structured exercise class, Type of exercise: Other - see comments [place] Done), Time (Minutes): 45, Frequency (Times/Week): 6, Weekly Exercise (Minutes/Week): 270, Intensity: Mild, Exercise limited by: None identified   Goals Addressed             This Visit's Progress    Patient Stated       Eat healthy, drink plenty of water & continue exercising       Depression Screen PHQ 2/9 Scores 12/24/2020 11/06/2020 11/05/2019 11/03/2018 10/21/2017 10/18/2016 01/24/2016  PHQ - 2 Score 0 0 0 0 0 0 0  PHQ- 9 Score - - - 0 1 - -    Fall Risk Fall Risk  12/24/2020 11/06/2020 11/05/2019 11/03/2018 10/18/2016  Falls in the past year? 0 0 0 0 No  Number falls in past yr: 0 0 0 - -  Injury with Fall? 0 0 0 - -  Follow up Falls prevention discussed Falls evaluation completed Falls evaluation completed - -    FALL RISK PREVENTION PERTAINING TO THE HOME:  Any stairs in or around the home? Yes  If so, are there any without handrails? No   Home free of loose throw  rugs in walkways, pet beds, electrical cords, etc? Yes  Adequate lighting in your home to reduce risk of falls? Yes   ASSISTIVE DEVICES UTILIZED TO PREVENT FALLS:  Life alert? No  Use of a cane, walker or w/c? No  Grab bars in the bathroom? No  Shower chair or bench in shower? No  Elevated toilet seat or a handicapped toilet? No   TIMED UP AND GO:  Was the test performed? No . Phone visit   Cognitive Function:Normal cognitive status assessed by this Nurse Health Advisor. No abnormalities found.   MMSE - Mini Mental State Exam 11/03/2018  Orientation to time 5  Orientation to Place 5  Registration 3  Attention/ Calculation 5  Recall 3  Language- name 2 objects 2  Language- repeat 1  Language- follow 3 step command 3  Language- read & follow direction 1  Write a sentence 1  Copy design 1  Total score 30        Immunizations Immunization History  Administered Date(s) Administered   DTaP 02/03/2019   Fluad Quad(high Dose 65+) 11/03/2018, 11/06/2020   Influenza Whole 11/30/2006, 12/13/2007, 01/01/2009, 12/31/2009   Influenza,inj,Quad PF,6+ Mos 11/27/2017   Influenza-Unspecified 10/24/2013, 11/28/2016, 11/23/2019   PFIZER(Purple Top)SARS-COV-2 Vaccination 03/31/2019, 04/25/2019, 11/23/2019, 09/01/2020, 12/03/2020   Pneumococcal Conjugate-13 11/03/2018   Pneumococcal Polysaccharide-23 11/05/2019   Td 08/28/2002, 04/18/2008   Tdap 01/24/2019   Zoster Recombinat (Shingrix) 11/28/2016, 02/04/2017   Zoster, Live 08/20/2013    TDAP status: Up to date  Flu Vaccine status: Up to date  Pneumococcal vaccine status: Up to date  Covid-19 vaccine status: Completed vaccines  Qualifies for Shingles Vaccine? No   Zostavax completed Yes   Shingrix Completed?: Yes  Screening Tests Health Maintenance  Topic Date Due   DEXA SCAN  07/17/2021   MAMMOGRAM  07/23/2021   COLONOSCOPY (Pts 45-52yrs Insurance coverage will need to be confirmed)  10/27/2027    TETANUS/TDAP  01/23/2029   Pneumonia Vaccine 56+ Years old  Completed   INFLUENZA VACCINE  Completed   COVID-19 Vaccine  Completed   Hepatitis C Screening  Completed   Zoster Vaccines- Shingrix  Completed   HPV VACCINES  Aged Out    Health Maintenance  There are no preventive care reminders to display for this patient.  Colorectal cancer screening: Type of screening: Colonoscopy. Completed 10/26/2017. Repeat every 10 years  Mammogram status: Completed bilateral 07/23/2020. Repeat every year  Bone Density status: Completed 07/18/2019. Results reflect: Bone density results: OSTEOPOROSIS. Repeat every 2 years.  Lung Cancer Screening: (Low Dose CT Chest recommended if Age 1-80 years, 30 pack-year currently smoking OR have quit w/in 15years.) does not qualify.     Additional Screening:  Hepatitis C Screening: Completed 09/23/2015  Vision Screening: Recommended annual ophthalmology exams for early detection of glaucoma and other disorders of the eye. Is the patient up to date with their annual eye exam?  Yes  Who is the provider or what is the name of the office in which the patient attends annual eye exams? Dr. Laban Emperor  Dental Screening: Recommended annual dental exams for proper oral hygiene  Community Resource Referral / Chronic Care Management: CRR required this visit?  No   CCM required this visit?  No      Plan:     I have personally reviewed and noted the following in the patient's chart:   Medical and social history Use of alcohol, tobacco or illicit drugs  Current medications and supplements including opioid prescriptions. Patient is  not currently taking opioid prescriptions. Functional ability and status Nutritional status Physical activity Advanced directives List of other physicians Hospitalizations, surgeries, and ER visits in previous 12 months Vitals Screenings to include cognitive, depression, and falls Referrals and appointments  In addition, I have  reviewed and discussed with patient certain preventive protocols, quality metrics, and best practice recommendations. A written personalized care plan for preventive services as well as general preventive health recommendations were provided to patient.   Due to this being a telephonic visit, the after visit summary with patients personalized plan was offered to patient via mail or my-chart.  Patient would like to access on my-chart.   Marta Antu, LPN   [id number]/06/[id number]  Nurse Health Advisor  Nurse Notes: None

## 2020-12-30 ENCOUNTER — Encounter: Payer: Self-pay | Admitting: Family Medicine

## 2020-12-30 DIAGNOSIS — H35033 Hypertensive retinopathy, bilateral: Secondary | ICD-10-CM | POA: Diagnosis not present

## 2020-12-30 DIAGNOSIS — H348322 Tributary (branch) retinal vein occlusion, left eye, stable: Secondary | ICD-10-CM | POA: Diagnosis not present

## 2020-12-30 DIAGNOSIS — H3562 Retinal hemorrhage, left eye: Secondary | ICD-10-CM | POA: Diagnosis not present

## 2021-01-02 NOTE — Telephone Encounter (Signed)
Charisma: Maybe you can help, I am a little confused. Is pt starting Evenity Or Prolia? Does this effect our PA that was started?

## 2021-01-19 ENCOUNTER — Telehealth: Payer: Self-pay

## 2021-01-19 ENCOUNTER — Encounter: Payer: Self-pay | Admitting: Family Medicine

## 2021-01-19 DIAGNOSIS — M810 Age-related osteoporosis without current pathological fracture: Secondary | ICD-10-CM

## 2021-01-19 MED ORDER — ALENDRONATE SODIUM 70 MG PO TABS: 70.0000 mg | ORAL_TABLET | ORAL | 3 refills | Status: DC

## 2021-01-19 NOTE — Telephone Encounter (Signed)
Please scheduled patient. Thank you

## 2021-01-19 NOTE — Telephone Encounter (Signed)
PA approved via covermymeds- pt will owe $0.00 at time of injection. Ready to schedule.    [employer] Key: [id number] - PA Case ID: [id number] Need help? Call us at [phone] Outcome Approved on November 24 PA Case: [id number], Status: Approved, Coverage Starts on: 04/30/2020 12:00:00 AM, Coverage Ends on: 02/21/2022 12:00:00 AM. Questions? Contact [phone].

## 2021-01-20 ENCOUNTER — Telehealth: Payer: Self-pay

## 2021-01-20 NOTE — Telephone Encounter (Signed)
FYI: Based on insurance coverage pt will start Prolia injections this month since it is no cost to her. She knows to not pick up the Fosomax refill that was sent in 01/19/21.

## 2021-01-24 ENCOUNTER — Telehealth: Payer: Self-pay | Admitting: Student in an Organized Health Care Education/Training Program

## 2021-01-24 ENCOUNTER — Other Ambulatory Visit: Payer: Self-pay

## 2021-01-24 ENCOUNTER — Encounter (HOSPITAL_BASED_OUTPATIENT_CLINIC_OR_DEPARTMENT_OTHER): Payer: Self-pay | Admitting: *Deleted

## 2021-01-24 ENCOUNTER — Emergency Department (HOSPITAL_BASED_OUTPATIENT_CLINIC_OR_DEPARTMENT_OTHER)
Admission: EM | Admit: 2021-01-24 | Discharge: 2021-01-25 | Disposition: A | Discharge status: Home / Self Care | Payer: Medicare PPO | Attending: Emergency Medicine | Admitting: Emergency Medicine

## 2021-01-24 ENCOUNTER — Emergency Department (HOSPITAL_BASED_OUTPATIENT_CLINIC_OR_DEPARTMENT_OTHER): Payer: Medicare PPO

## 2021-01-24 DIAGNOSIS — I10 Essential (primary) hypertension: Secondary | ICD-10-CM | POA: Diagnosis not present

## 2021-01-24 DIAGNOSIS — Z79899 Other long term (current) drug therapy: Secondary | ICD-10-CM | POA: Diagnosis not present

## 2021-01-24 DIAGNOSIS — R079 Chest pain, unspecified: Secondary | ICD-10-CM

## 2021-01-24 DIAGNOSIS — Z87891 Personal history of nicotine dependence: Secondary | ICD-10-CM | POA: Diagnosis not present

## 2021-01-24 DIAGNOSIS — R072 Precordial pain: Secondary | ICD-10-CM | POA: Insufficient documentation

## 2021-01-24 DIAGNOSIS — I2510 Atherosclerotic heart disease of native coronary artery without angina pectoris: Secondary | ICD-10-CM | POA: Diagnosis not present

## 2021-01-24 DIAGNOSIS — R0789 Other chest pain: Secondary | ICD-10-CM | POA: Diagnosis not present

## 2021-01-24 LAB — COMPREHENSIVE METABOLIC PANEL
ALT: 22 U/L (ref 0–44)
AST: 19 U/L (ref 15–41)
Albumin: 4.2 g/dL (ref 3.5–5.0)
Alkaline Phosphatase: 73 U/L (ref 38–126)
Anion gap: 8 (ref 5–15)
BUN: 9 mg/dL (ref 8–23)
CO2: 24 mmol/L (ref 22–32)
Calcium: 8.8 mg/dL — ABNORMAL LOW (ref 8.9–10.3)
Chloride: 101 mmol/L (ref 98–111)
Creatinine, Ser: 0.57 mg/dL (ref 0.44–1.00)
GFR, Estimated: >60 mL/min (ref >60)
Glucose, Bld: 97 mg/dL (ref 70–99)
Potassium: 3.5 mmol/L (ref 3.5–5.1)
Sodium: 133 mmol/L — ABNORMAL LOW (ref 135–145)
Total Bilirubin: 0.6 mg/dL (ref 0.3–1.2)
Total Protein: 6.9 g/dL (ref 6.5–8.1)

## 2021-01-24 LAB — TROPONIN I (HIGH SENSITIVITY): Troponin I (High Sensitivity): 2 ng/L (ref <18)

## 2021-01-24 LAB — CBC WITH DIFFERENTIAL/PLATELET
Abs Immature Granulocytes: 0.01 10*3/uL (ref 0.00–0.07)
Basophils Absolute: 0 10*3/uL (ref 0.0–0.1)
Basophils Relative: 1 %
Eosinophils Absolute: 0.1 10*3/uL (ref 0.0–0.5)
Eosinophils Relative: 2 %
HCT: 34.4 % — ABNORMAL LOW (ref 36.0–46.0)
Hemoglobin: 12.4 g/dL (ref 12.0–15.0)
Immature Granulocytes: 0 %
Lymphocytes Relative: 35 %
Lymphs Abs: 2.1 10*3/uL (ref 0.7–4.0)
MCH: 31 pg (ref 26.0–34.0)
MCHC: 36 g/dL (ref 30.0–36.0)
MCV: 86 fL (ref 80.0–100.0)
Monocytes Absolute: 0.5 10*3/uL (ref 0.1–1.0)
Monocytes Relative: 8 %
Neutro Abs: 3.2 10*3/uL (ref 1.7–7.7)
Neutrophils Relative %: 54 %
Platelets: 168 10*3/uL (ref 150–400)
RBC: 4 MIL/uL (ref 3.87–5.11)
RDW: 11.9 % (ref 11.5–15.5)
WBC: 5.9 10*3/uL (ref 4.0–10.5)
nRBC: 0 % (ref 0.0–0.2)

## 2021-01-24 MED ORDER — ASPIRIN 81 MG PO CHEW
324.0000 mg | CHEWABLE_TABLET | Freq: Once | ORAL | Status: AC
  Administered 2021-01-24: 324 mg via ORAL
  Filled 2021-01-24: qty 4

## 2021-01-24 MED ORDER — NITROGLYCERIN 0.4 MG SL SUBL
0.4000 mg | SUBLINGUAL_TABLET | SUBLINGUAL | Status: DC | PRN
  Administered 2021-01-24 – 2021-01-25 (×3): 0.4 mg via SUBLINGUAL
  Filled 2021-01-24: qty 1

## 2021-01-24 NOTE — Telephone Encounter (Signed)
Paged by Dr. Billy Fischer to discuss patient at [facility] ED who presented with chest pain since Wednesday.  She had 1 episode a few weeks ago and then another 1 Wednesday, part of Thursday and Friday.  She had a 90-minute exercise class today without symptoms however 1 to 2 hours later felt recurrent symptoms.  She follows with Dr. Radford Pax and was last seen in clinic August 06, 2020.  She was referred for coronary artery calcifications found on HRCT for cough evaluation.  CAC score was 38 or 69th percentile for matched controls.  She had a Lexiscan Myoview in June 2021 with no ischemia.  She denied any anginal symptoms during that visit.  Her blood pressure was well controlled (120/76). Today BP was sBP 170-190s, now 134/87 today but still symptoms despite BP improvement.   Prior cardiac eval  CT cardiac scoring Result date: 09/10/19 Coronary calcium score of 38. This was 69th percentile for age and sex matched control.  Lexiscan Result date: 08/21/19 Nuclear stress EF: 67%. There was no ST segment deviation noted during stress. The study is normal. This is a low risk study. The left ventricular ejection fraction is hyperdynamic (>65%).  ECG Result date: 01/24/21 (21:04:15) NSR 68, RBBB, PR 136, QRS 118, Qtc 450  No interval ischemic changes when compared to 12/12/19   hsT (2) at 22:11  CXR with no acute abnormalities.   Called patient on phone Chest heaviness a few weeks ago. Then resolved and didn't come back until this week. Then started again Wednesday. Not severe but uncomfortable, everything she does she is aware of ("pest on her chest"). Sleeps fine at night, hasn't woken up from it. Family hx with father who had MI in 41s. Mother had CVA at 98. She is not reporting any focal pain but more pressure/tightness. She smoked for 14 years but quit in 1984. HTN for >20 years. Doesn't check it frequently at home because office BP readings were good at recent visit. She still feels mild chest  pressure currently. She doesn't feel that the [facility] helped much with chest pressure, maybe mildly improved. Rates pressure as mild. It is not worth with exertion and ?minimal improvement with NG. Negative hsT and ECG. Seems to be noncardiac but will await repeat hsT to make sure they rule out.  Spoke with her on the phone and explained that if the troponins are negative there is a low likelihood that she is having an active MI however this does not rule out obstructive CAD.  She understands this and is okay with going home with return precautions and clinic follow-up with Dr. Radford Pax.  She may benefit from coronary CT to better define functional anatomy if her symptoms persist.  I will coordinate with our team for clinic follow-up this hST is negative on repeat check.

## 2021-01-24 NOTE — ED Provider Notes (Signed)
[place] EMERGENCY DEPARTMENT Provider Note   CSN: [id number] Arrival date & time: 01/24/21  2055     History Chief Complaint  Patient presents with   Chest Pain    [name] is a 65 y.o. female.  HPI     65yo female with history of hypretension, spontaneous pneumothorax, CAD, bronchiectasis, hyperlipidemia, who presents with concern for chest pain.    Pressure, grabbing pain, heaviness in center of chest, more aware of heart beating.  Had one day a few weeks ago, then Wednesday began again, then part of thurs, Friday, then today was good part of today, off of fosamax, getting ready to start prolia, has not had fosamax for a few weeks Has not taken tylenol,  90 minutes of jazzersize today, started an hour or two later, felt like someone standing on chest No fever, not coughing up anything (chronic coughing) Doesn't change with burping   Seen Dr. Radford Pax, Ca score 38 was 69% for age   41, hlpd Dad in 67s with CAD, mom had stroke hx   Past Medical History:  Diagnosis Date   Allergy    Cataract    Chronic cough 01/24/2019   Costochondritis 01/24/2019   Epigastric pain 07/29/2016   FIBROIDS, UTERUS 01/18/2007   Qualifier: Diagnosis of  By: Jerold Coombe     H/O Bell's palsy 05/2010   HYPERTENSION 03/24/2007   Qualifier: Diagnosis of  By: Ronnald Ramp CMA, [place]     MENORRHAGIA, PERIMENOPAUSAL 01/13/2007   Qualifier: Diagnosis of  By: Jerold Coombe     MOLE 05/07/2010   Qualifier: Diagnosis of  By: Jerold Coombe     Osteopenia 08/23/2014   RUQ pain 07/29/2016   Spontaneous pneumothorax 2005   R, s/p tube, then VATS   Spontaneous tension pneumothorax 03/24/2007   Qualifier: Diagnosis of  By: Jerold Coombe      Patient Active Problem List   Diagnosis Date Noted   Preventative health care 11/06/2020   Coronary artery disease involving native heart with angina pectoris ([place]) 05/05/2020   Age-related osteoporosis without current  pathological fracture 05/05/2020   Hyperlipidemia 05/05/2020   Jaw pain 12/23/2019   Bronchiectasis without complication ([place])    Atherosclerosis 07/08/2019   Costochondritis 01/24/2019   Chronic cough 01/24/2019   History of cigarette smoking 01/24/2019   RUQ pain 07/29/2016   Epigastric pain 07/29/2016   Osteopenia 08/23/2014   MOLE 05/07/2010   MAMMOGRAM, ABNORMAL 04/18/2008   Primary hypertension 03/24/2007   SPONTANEOUS TENSION PNEUMOTHORAX 03/24/2007   FIBROIDS, UTERUS 01/18/2007   MENORRHAGIA, PERIMENOPAUSAL 01/13/2007    Past Surgical History:  Procedure Laterality Date   BRONCHIAL WASHINGS  08/06/2019   Procedure: BRONCHIAL lavage;  Surgeon: Brand Males, MD;  Location: [facility] ENDOSCOPY;  Service: Endoscopy;;   CESAREAN SECTION     CHEST TUBE INSERTION  2005   spontaneus Pneumothorax   COLONOSCOPY     SPIROMETRY  01/20/2015   Dr. Donneta Romberg   VIDEO ASSISTED THORACOSCOPY  2005   VIDEO BRONCHOSCOPY N/A 08/06/2019   Procedure: VIDEO BRONCHOSCOPY WITHOUT FLUORO;  Surgeon: Brand Males, MD;  Location: [facility] ENDOSCOPY;  Service: Endoscopy;  Laterality: N/A;   WISDOM TOOTH EXTRACTION       OB History     Gravida  4   Para  3   Term      Preterm      AB  1   Living  3      SAB  IAB      Ectopic      Multiple      Live Births              Family History  Problem Relation Age of Onset   Hypertension Mother    Stroke Mother    Heart disease Father    Hypertension Father    Cancer Maternal Grandmother 64       breast   Diabetes Maternal Grandmother    Colon cancer Neg Hx    Esophageal cancer Neg Hx    Rectal cancer Neg Hx    Stomach cancer Neg Hx     Social History   Tobacco Use   Smoking status: Former    Packs/day: 1.00    Years: 14.00    Pack years: 14.00    Types: Cigarettes    Quit date: 07/11/1982    Years since quitting: 38.5   Smokeless tobacco: Never  Vaping Use   Vaping Use: Never used  Substance Use Topics    Alcohol use: Yes    Alcohol/week: 0.0 standard drinks    Comment: rare   Drug use: No    Home Medications Prior to Admission medications   Medication Sig Start Date End Date Taking? Authorizing Provider  acetaminophen (TYLENOL) 500 MG tablet Take 500 mg by mouth every 6 (six) hours as needed for moderate pain or headache.    [provider]  albuterol (VENTOLIN HFA) 108 (90 Base) MCG/ACT inhaler Inhale 1-2 puffs into the lungs every 6 (six) hours as needed for wheezing or shortness of breath.    [provider]  alendronate (FOSAMAX) 70 MG tablet Take 1 tablet (70 mg total) by mouth every 7 (seven) days. Take with a full glass of water on an empty stomach. 01/19/21   Roma Schanz R, DO  atorvastatin (LIPITOR) 20 MG tablet TAKE 1 TABLET(20 MG) BY MOUTH DAILY 08/29/20   Sueanne Margarita, MD  azelastine (ASTELIN) 0.1 % nasal spray Place 1 spray into both nostrils 2 (two) times daily. 10/03/17   [provider]  Calcium Carb-Cholecalciferol (CALCIUM 600 + D PO) Take 1 tablet by mouth daily.    [provider]  fluticasone (FLONASE) 50 MCG/ACT nasal spray Place 1 spray into both nostrils daily.    [provider]  gabapentin (NEURONTIN) 300 MG capsule Take 1 capsule (300 mg total) by mouth 3 (three) times daily. 12/10/20   Brand Males, MD  levocetirizine (XYZAL) 5 MG tablet Take 5 mg by mouth every evening.  12/27/17   [provider]  montelukast (SINGULAIR) 10 MG tablet Take 10 mg by mouth at bedtime.    [provider]  spironolactone (ALDACTONE) 25 MG tablet Take 1 tablet (25 mg total) by mouth daily. 08/06/20   Sueanne Margarita, MD  valACYclovir (VALTREX) 500 MG tablet Take 1 tablet po bid x 5 days as needed for outbreak 10/09/20   Marrian Salvage, FNP  valsartan (DIOVAN) 160 MG tablet Take 1 tablet (160 mg total) by mouth daily. 08/06/20   Sueanne Margarita, MD    Allergies    Patient has no known allergies.  Review  of Systems   Review of Systems  Constitutional:  Negative for fever.  HENT:  Negative for congestion. Rhinorrhea: nonallergic rhinitis.  Respiratory:  Positive for cough (chronic no change). Negative for shortness of breath.   Cardiovascular:  Positive for chest pain.  Gastrointestinal:  Negative for abdominal pain, nausea and vomiting.  Genitourinary:  Negative for dysuria.  Musculoskeletal:  Negative for back pain.  Skin:  Negative for rash.  Neurological:  Negative for syncope, light-headedness and headaches.   Physical Exam Updated Vital Signs BP 126/84   Pulse (!) 55   Temp 98 F (36.7 C) (Oral)   Resp 11   Ht 5\' 3"  (1.6 m)   Wt 54.4 kg   SpO2 99%   BMI 21.26 kg/m   Physical Exam Vitals and nursing note reviewed.  Constitutional:      General: She is not in acute distress.    Appearance: She is well-developed. She is not diaphoretic.  HENT:     Head: Normocephalic and atraumatic.  Eyes:     Conjunctiva/sclera: Conjunctivae normal.  Cardiovascular:     Rate and Rhythm: Normal rate and regular rhythm.     Heart sounds: Normal heart sounds. No murmur heard.   No friction rub. No gallop.  Pulmonary:     Effort: Pulmonary effort is normal. No respiratory distress.     Breath sounds: Normal breath sounds. No wheezing or rales.  Abdominal:     General: There is no distension.     Palpations: Abdomen is soft.     Tenderness: There is no abdominal tenderness. There is no guarding.  Musculoskeletal:        General: No tenderness.     Cervical back: Normal range of motion.  Skin:    General: Skin is warm and dry.     Findings: No erythema or rash.  Neurological:     Mental Status: She is alert and oriented to person, place, and time.    ED Results / Procedures / Treatments   Labs (all labs ordered are listed, but only abnormal results are displayed) Labs Reviewed  CBC WITH DIFFERENTIAL/PLATELET - Abnormal; Notable for the following components:      Result Value    HCT 34.4 (*)    All other components within normal limits  COMPREHENSIVE METABOLIC PANEL - Abnormal; Notable for the following components:   Sodium 133 (*)    Calcium 8.8 (*)    All other components within normal limits  TROPONIN I (HIGH SENSITIVITY)  TROPONIN I (HIGH SENSITIVITY)    EKG EKG Interpretation  Date/Time:  Saturday January 24 2021 21:04:15 EST Ventricular Rate:  68 PR Interval:  136 QRS Duration: 118 QT Interval:  424 QTC Calculation: 450 R Axis:   65 Text Interpretation: Normal sinus rhythm Right bundle branch block Abnormal ECG No significant change since last tracing Confirmed by Gareth Morgan [phone]) on 01/24/2021 11:27:00 PM  Radiology DG Chest 2 View  Result Date: 01/24/2021 CLINICAL DATA:  Substernal chest pain EXAM: CHEST - 2 VIEW COMPARISON:  12/11/2019 FINDINGS: Lungs are well expanded, symmetric, and clear. No pneumothorax or pleural effusion. Cardiac size within normal limits. Pulmonary vascularity is normal. Osseous structures are age-appropriate. No acute bone abnormality. IMPRESSION: No active cardiopulmonary disease. Electronically Signed   By: Fidela Salisbury M.D.   On: 01/24/2021 22:51    Procedures Procedures   Medications Ordered in ED Medications  nitroGLYCERIN (NITROSTAT) SL tablet 0.4 mg (0.4 mg Sublingual Given 01/25/21 0034)  aspirin chewable tablet 324 mg (324 mg Oral Given 01/24/21 2311)    ED Course  I have reviewed the triage vital signs and the nursing notes.  Pertinent labs & imaging results that were available during my care of the patient were reviewed by me and considered in my medical decision making (see chart for details).  MDM Rules/Calculators/A&P                            65yo female with history of hypretension, spontaneous pneumothorax, CAD, bronchiectasis, hyperlipidemia, who presents with concern for chest pain.  Differential diagnosis for chest pain includes pulmonary embolus, dissection, pneumothorax,  pneumonia, ACS, myocarditis, pericarditis.  EKG was done and evaluate by me and showed no acute ST changes and no signs of pericarditis. Chest x-ray was done and evaluated by me and radiology and showed no sign of pneumonia or pneumothorax.  No dyspnea, doubt PE.  Do not feel history or exam are consistent with aortic dissection.  Discussed with Dr. Renella Cunas the Cardiologist on call regarding patient given her elevated Ca score.  Discussed with patient and plan for close outpatient follow up with Dr. Radford Pax. Delta troponins are both negative. She was able to jazzercise without pain today, and feel she is stable for close outpatient follow up and strict return precautions. Patient discharged in stable condition with understanding of reasons to return.    Final Clinical Impression(s) / ED Diagnoses Final diagnoses:  Nonspecific chest pain    Rx / DC Orders ED Discharge Orders     None        Gareth Morgan, MD 01/25/21 0106

## 2021-01-24 NOTE — ED Triage Notes (Signed)
Pt reports substernal chest pain since Wednesday- feels heavy and tight

## 2021-01-25 LAB — TROPONIN I (HIGH SENSITIVITY): Troponin I (High Sensitivity): 2 ng/L (ref <18)

## 2021-01-27 ENCOUNTER — Telehealth: Payer: Self-pay

## 2021-01-27 NOTE — Telephone Encounter (Signed)
Patient is schedule for prolia injection 01/30/2021 @ 10:00 am no out of pocket fees [place] Case : [id number], Status: Approved coverage starts 04/30/20 to 02/21/2021 per Covermymeds.

## 2021-01-30 ENCOUNTER — Ambulatory Visit (INDEPENDENT_AMBULATORY_CARE_PROVIDER_SITE_OTHER): Payer: Medicare PPO | Admitting: *Deleted

## 2021-01-30 DIAGNOSIS — M810 Age-related osteoporosis without current pathological fracture: Secondary | ICD-10-CM | POA: Diagnosis not present

## 2021-01-30 MED ORDER — DENOSUMAB 60 MG/ML ~~LOC~~ SOSY
60.0000 mg | PREFILLED_SYRINGE | Freq: Once | SUBCUTANEOUS | Status: AC
  Administered 2021-01-30: 60 mg via SUBCUTANEOUS

## 2021-01-30 NOTE — Progress Notes (Signed)
Patient is here for Prolia injection per physicians order.  Injection given in left arm and patient tolerated well.  Fosamax??

## 2021-02-05 ENCOUNTER — Encounter: Payer: Self-pay | Admitting: Internal Medicine

## 2021-02-05 ENCOUNTER — Other Ambulatory Visit: Payer: Self-pay

## 2021-02-05 ENCOUNTER — Ambulatory Visit: Payer: Medicare PPO | Admitting: Internal Medicine

## 2021-02-05 VITALS — BP 116/68 | HR 78 | Temp 98.4°F | Ht 63.0 in | Wt 125.4 lb

## 2021-02-05 DIAGNOSIS — R918 Other nonspecific abnormal finding of lung field: Secondary | ICD-10-CM

## 2021-02-05 DIAGNOSIS — J387 Other diseases of larynx: Secondary | ICD-10-CM | POA: Diagnosis not present

## 2021-02-05 DIAGNOSIS — Z87891 Personal history of nicotine dependence: Secondary | ICD-10-CM

## 2021-02-05 NOTE — Progress Notes (Signed)
OV 06/25/2019  Subjective:  Patient ID: [name], female , DOB: 07-Jul-1955 , age 65 y.o. , MRN: [id number] , ADDRESS: [address] [place] [facility] [address]   06/25/2019 -   Chief Complaint  Patient presents with   Consult    Pt is here due to having a persistent dry cough for years. Pt also is being treated for a pneumothorax. States her cough could be bad at any time of the day.     HPI [name] 65 y.o. -has been referred by Dr. Donneta Romberg allergist for evaluation of chronic cough.  Patient tells me that she has had insidious onset of chronic cough since the 1990s.  Since then it is persistent and is at the same level.  Cough is described as moderate.  Mostly dry except when she has a flareup with an infection or bronchitis.  In 2005 she had a spontaneous right pneumothorax after respiratory flareup.  This was treated initially by tube thoracostomy and then by pleurodesis by Dr. Caffie Pinto according to the patient's history.  Since then she has not had a recurrence of pneumothorax but does continue to have chronic cough.  The cough is definitely made worse by talking a lot or by inhaling fumes such as perfume or any smoke exposure.  She says the cough during this episodes can be so severe that she almost has a whooping sound to it.  This not necessarily any associated shortness of breath.  The cough is mostly dry.  The cough does not wake her up at night.  Severity index is listed below.  Talking does make the cough worse.  Cough relevant history  -Hypertension: She is not on ACE inhibitor but is on ARB valsartan.  This is not associated with cough  -Allergy history: She did have onset of spring allergies in 1990s.  During this time allergy testing showed positive allergies to a few things including mold and grass.  She says repeat allergy testing some years ago by Dr. Donneta Romberg revealed no allergies.  She is not on allergy shots but she is on antihistamines and nasal steroids.  -Sinus  history: She is not had a CT scan of the sinus.  She does not have a significant postnasal drip but she is also on antihistamines.  She has never been evaluated by ENT.  Acid reflux history: She denies any strong acid reflux.  She states in 20 years she is probably taken a few bottles of Tums in total.  She is never seen GI.  However a GI referral is under consideration by Dr. Donneta Romberg  -Chronic refractory cough: She does clear her throat.  - Verbal output hx: she is considered a chatty person by her family. Job was a Pharmacologist at [employer].  During this time she had to talk a lot.  -Imaging: Chest x-ray 2020 personally visualized and is clear.    CXR 01/23/2019  IMPRESSION: Stable postoperative chest with mild pleural thickening bilaterally. No evidence of recurrent pneumothorax or acute cardiopulmonary process.     Electronically Signed   By: Richardean Sale M.D.   On: 01/23/2019 14:44   ROS - per HPI  Results for Melissa, [name] (MRN [id number]) as of 06/25/2019 09:38  Ref. Range 08/20/2013 09:35 02/21/2014 10:40 08/23/2014 10:30 09/23/2015 16:35 07/29/2016 16:46 10/18/2016 10:06 10/21/2017 10:05 11/03/2018 13:56  Eosinophils Absolute Latest Ref Range: 0.0 - 0.7 K/uL 0.2 0.1 0.1 0.1 0.1 0.1 0.1 0.1   Results for  Melissa, [name] (MRN [id number]) as of 06/25/2019 09:38  Ref. Range 03/22/2017 13:54  Influenza A, POC Latest Ref Range: Negative  Positive (A)  Influenza B, POC Latest Ref Range: Negative  Negative      07/04/2019 ov - beth walsh Patient presents today for 10-day follow-up to review CT imaging. She has had a chronic cough for a long times. She takes montelukast and Xyzal. Uses maintenance inahler Advair hfa 115-21 twice daily and prn albuterol hfa prior to exercise. She states that nothing helps her cough. Triggers include strong scents, tart wine and spicy food. She has post nasal drip symptoms. She gets out of breath with speaking. She has no issues with 45 min  exercise class. No significant shortness of breath. No obvious reflux symptoms. She has a known hypodensity liver stable 2014. She does have some epigastric pain. She has an upcoming apt with GI but may rescheduled until after bronchscopy.   Imaging:  CT showed scattered mild cylindrical bronchiectasis mid lungs with associated mild patchy tree-in-bud opacities and mild regions of subpleural scarring.  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  Few scattered solid pulmonary nodules in both lungs largest measuring 6 mm right lower lobe. 3. Two-vessel coronary atherosclerosis. 4. Aortic Atherosclerosis (ICD10-I70.0) and Emphysema   06/30/19 CT maxillofacial - Negative for sinusitis    OV 08/20/2019  Subjective:  Patient ID: [name], female , DOB: Dec 03, 1955 , age 50 y.o. , MRN: [id number] , ADDRESS: [address] [place] [facility] [address]   08/20/2019 -   Chief Complaint  Patient presents with   Follow-up    still has a dry cough     HPI [name] 65 y.o. -video visit for chronic cough.  This is still follow-up with bronchoscopy results.  She had bronchoscopy mid June 2021.  She had normal airway exam.  Right middle lobe lavage shows neutrophilia.  She is growing Candida albicans.  She is not immunocompromise per her history.  On this video visit she is joined by her husband.  Husband and she report that she coughs early in the morning.  She has seen Dr. Benson Norway of GI who is advised expectant approach.  Certainly he will offer more testing according to history if her symptoms persist.  Medication review shows she is on Advair MDI and also she is taking fish oil.  She says she been taking fish oil for many years.  She has never been on gabapentin but is willing to try this in the future.  She is aware of the concept of neurogenic cough.    Dr Lorenza Cambridge Reflux Symptom Index (> 13-15 suggestive of LPR cough)  06/25/2019   Hoarseness of problem with voice 3   Clearing  Of Throat 1  Excess throat mucus or feeling of post nasal drip 1  Difficulty swallowing food, liquid or tablets 0  Cough after eating or lying down 2  Breathing difficulties or choking episodes 3  Troublesome or annoying cough 5  Sensation of something sticking in throat or lump in throat 2  Heartburn, chest pain, indigestion, or stomach acid coming up 1  TOTAL 17     Results for TORY, MCKISSACK (MRN [id number]) as of 08/20/2019 09:24  Ref. Range 08/06/2019 07:28  Monocyte-Macrophage-Serous Fluid Latest Ref Range: 50 - 90 % 28 (L)  Other Cells, Fluid Latest Units: % CORRELATE WITH CYTOLOGY.  Fluid Type-FCT Unknown Bronch Lavag  Color, Fluid Latest Ref Range: YELLOW  COLORLESS (A)  Total  Nucleated Cell Count, Fluid Latest Ref Range: 0 - 1,000 cu mm 325  Lymphs, Fluid Latest Units: % 5  Appearance, Fluid Latest Ref Range: CLEAR  HAZY (A)  Neutrophil Count, Fluid Latest Ref Range: 0 - 25 % 67 (H)    Component 2 wk ago  Fungal result 1 Candida albicans Abnormal    AFB Specimen Processing Concentration   Acid Fast Smear Negative     OV 10/23/2019  Subjective:  Patient ID: [name], female , DOB: 08-25-1955 , age 67 y.o. , MRN: [id number] , ADDRESS: [address] [place] [address]   10/23/2019 -   Chief Complaint  Patient presents with   Follow-up    Chronic cough     HPI [name] 65 y.o. -returns for follow-up of her chronic cough.  After her last visit bronchoalveolar lavage cultures continue to be negative other than Candida as above.  She thinks the Candida might have been due to the different steroid inhaler she is taking in the past.  Most recently she has been on Advair for 6 months.  She notices no difference because of it.  She continues to have a cough with clearing of the throat.  She states ice helps continue to soothe her cough.  She says it is random.  She is clearing her throat.  Stopping fish oil has not helped her.  Taking 1 week of  Diflucan has not helped her.  She wants to try other interventions to get rid of her symptoms.  However on the symptom questionnaire she is some better which she later admitted to that she was some better but not fully better.     OV 11/13/2020  Subjective:  Patient ID: [name], female , DOB: December 02, 1955 , age 75 y.o. , MRN: [id number] , ADDRESS: [address] [place] [address] PCP Carollee Herter, Alferd Apa, DO Patient Care Team: Carollee Herter, Alferd Apa, DO as PCP - General Sueanne Margarita, MD as PCP - Cardiology (Cardiology) Mosetta Anis, MD as Referring Physician (Allergy) Brand Males, MD as Consulting Physician (Pulmonary Disease) Jari Pigg, MD as Consulting Physician (Dermatology)  This Provider for this visit: Treatment Team:  Attending Provider: Brand Males, MD    11/13/2020 -   Chief Complaint  Patient presents with   Follow-up    Pt states her cough has been doing better with being on gabapentin.   Follow-up chronic cough Follow-up lung nodule multiple with largest 8 mm and right lower lobe May 2022 [stable through November 2021] Former history of smoking  HPI SMERA GUYETTE 65 y.o. -returns for follow-up.  At this point she had called over a month ago saying cough is back.  We prescribed her gabapentin and the cough is almost resolved.  She feels gabapentin is helping her.  She seen Dr. Donneta Romberg and apparently he has ruled out allergic rhinitis but she continues on Xyzal and Singulair.  She is not on any inhalers.  Last visit I told her to stop her fish oil which she has.  This visit I noticed that she is on Fosamax.  This can cause acid reflux.  We will give her instructions to talk to primary care and stop Fosamax.  Overall she is feeling well.  Her most recent CT chest was in May 2022 with multiple nodules with the largest being 8 mm right lower lobe.  That was stable since November 2021.  Radiologist recommending CT scan in 18 months from May 2022  which would be end of 2023 but given history of smoking I will probably get it sooner.       CLINICAL DATA:  Bronchiectasis with lung nodules.   EXAM: CT CHEST WITHOUT CONTRAST   TECHNIQUE: Multidetector CT imaging of the chest was performed following the standard protocol without IV contrast.   COMPARISON:  12/24/2019   FINDINGS: Cardiovascular: The heart size is normal. No substantial pericardial effusion. Atherosclerotic calcification is noted in the wall of the thoracic aorta.   Mediastinum/Nodes: No mediastinal lymphadenopathy. No evidence for gross hilar lymphadenopathy although assessment is limited by the lack of intravenous contrast on today's study. The esophagus has normal imaging features. There is no axillary lymphadenopathy.   Lungs/Pleura: Biapical pleuroparenchymal scarring evident with stable appearance of the right apical staple line. 3 mm nodule in the medial right apex measured previously is stable when remeasured in the same dimension today. Previously measured 3 mm anterior right upper lobe pulmonary nodule has resolved in the interval. 8 x 4 mm right lower lobe nodule on 83/8 was 7 x 5 mm previously, unchanged. Several additional scattered tiny pulmonary nodules are stable. No new suspicious pulmonary nodule or mass. No focal airspace consolidation. No pleural effusion.   Upper Abdomen: Unremarkable.   Musculoskeletal: No worrisome lytic or sclerotic osseous abnormality.   IMPRESSION: 1. Interval resolution of the 3 mm anterior right upper lobe pulmonary nodule. Other scattered tiny bilateral pulmonary nodules are stable in the interval including the 8 x 4 mm right lower lobe nodule. CT at 18-24 months (from today's scan) is considered optional for low-risk patients, but is recommended for high-risk patients. This recommendation follows the consensus statement: Guidelines for Management of Incidental Pulmonary Nodules Detected on CT Images:  From the Fleischner Society 2017; Radiology 2017; 284:228-243. 2. No new suspicious pulmonary nodule or mass. 3. Aortic Atherosclerosis (ICD10-I70.0).       OV 02/05/2021  Subjective:  Patient ID: [name], female , DOB: 12-07-1955 , age 61 y.o. , MRN: [id number] , ADDRESS: [address] [place] [address] PCP Carollee Herter, Alferd Apa, DO Patient Care Team: Carollee Herter, Alferd Apa, DO as PCP - General Sueanne Margarita, MD as PCP - Cardiology (Cardiology) Mosetta Anis, MD as Referring Physician (Allergy) Brand Males, MD as Consulting Physician (Pulmonary Disease) Jari Pigg, MD as Consulting Physician (Dermatology)  This Provider for this visit: Treatment Team:  Attending Provider: Brand Males, MD    02/05/2021 -   Chief Complaint  Patient presents with   Follow-up    Pt states she has been doing okay since last visit and denies any complaints.    Follow-up chronic cough -Repeat gabapentin since August 2022 -Stop Fosamax October 2022 Follow-up lung nodule multiple with largest 8 mm and right lower lobe May 2022 [stable through November 2021] Former history of smoking  HPI TWALA COLLINGS 64 y.o. -returns for follow-up.  After her last visit she stopped her Fosamax.  She believes the cough is stable/well-controlled.  She takes the gabapentin 3 mg 3 times daily.  She still states that sometimes unexpectedly early in the morning she will cough.  Dust and fumes can irritate her.  Restless cough score is 7 which is much better than 2021 but stable/slightly worse than September 2022.  Details below.  Nevertheless she is willing to cut down on the gabapentin and see what happens.  Last time she stopped gabapentin and within 5 to 6 weeks the cough came back.     Dr  Kouffman Reflux Symptom Index (> 13-15 suggestive of LPR cough)  06/25/2019  10/23/2019  11/13/2020 gabepentin 02/05/2021   Hoarseness of problem with voice 3 2 0 0  Clearing  Of Throat 1 2 0 2   Excess throat mucus or feeling of post nasal drip 1 1 0 0  Difficulty swallowing food, liquid or tablets 0 0 0 0  Cough after eating or lying down 2 0 0 0  Breathing difficulties or choking episodes 3 2 0 0  Troublesome or annoying cough _0 Sensation of something sticking in throat or lump in throat 2 0 0 1  Heartburn, chest pain, indigestion, or stomach acid coming up 1 0 0 1  TOTAL _1 CT Chest data  No results found.    PFT  PFT Results Latest Ref Rng & Units 08/02/2019  FVC-Pre L 2.76  FVC-Predicted Pre % 91  FVC-Post L 2.87  FVC-Predicted Post % 95  Pre FEV1/FVC % % 79  Post FEV1/FCV % % 79  FEV1-Pre L 2.19  FEV1-Predicted Pre % 95  FEV1-Post L 2.25  DLCO uncorrected ml/min/mmHg 19.12  DLCO UNC% % 100  DLCO corrected ml/min/mmHg 19.12  DLCO COR %Predicted % 100  DLVA Predicted % 102  TLC L 4.87  TLC % Predicted % 99  RV % Predicted % 102       has a past medical history of Allergy, Cataract, Chronic cough (01/24/2019), Costochondritis (01/24/2019), Epigastric pain (07/29/2016), FIBROIDS, UTERUS (01/18/2007), H/O Bell's palsy (05/2010), HYPERTENSION (03/24/2007), MENORRHAGIA, PERIMENOPAUSAL (01/13/2007), MOLE (05/07/2010), Osteopenia (08/23/2014), RUQ pain (07/29/2016), Spontaneous pneumothorax (2005), and Spontaneous tension pneumothorax (03/24/2007).   reports that she quit smoking about 38 years ago. Her smoking use included cigarettes. She has a 14.00 pack-year smoking history. She has never used smokeless tobacco.  Past Surgical History:  Procedure Laterality Date   BRONCHIAL WASHINGS  08/06/2019   Procedure: BRONCHIAL lavage;  Surgeon: Brand Males, MD;  Location: [facility];  Service: Endoscopy;;   CESAREAN SECTION     CHEST TUBE INSERTION  2005   spontaneus Pneumothorax   COLONOSCOPY     SPIROMETRY  01/20/2015   Dr. Donneta Romberg   VIDEO ASSISTED THORACOSCOPY  2005   VIDEO BRONCHOSCOPY N/A 08/06/2019   Procedure: VIDEO BRONCHOSCOPY  WITHOUT FLUORO;  Surgeon: Brand Males, MD;  Location: [facility];  Service: Endoscopy;  Laterality: N/A;   WISDOM TOOTH EXTRACTION      No Known Allergies  Immunization History  Administered Date(s) Administered   DTaP 02/03/2019   Fluad Quad(high Dose 65+) 11/03/2018, 11/06/2020   Influenza Whole 11/30/2006, 12/13/2007, 01/01/2009, 12/31/2009   Influenza,inj,Quad PF,6+ Mos 11/27/2017   Influenza-Unspecified 10/24/2013, 11/28/2016, 11/23/2019   PFIZER(Purple Top)SARS-COV-2 Vaccination 03/31/2019, 04/25/2019, 11/23/2019, 09/01/2020, 12/03/2020   Pneumococcal Conjugate-13 11/03/2018   Pneumococcal Polysaccharide-23 11/05/2019   Td 08/28/2002, 04/18/2008   Tdap 01/24/2019   Zoster Recombinat (Shingrix) 11/28/2016, 02/04/2017   Zoster, Live 08/20/2013    Family History  Problem Relation Age of Onset   Hypertension Mother    Stroke Mother    Heart disease Father    Hypertension Father    Cancer Maternal Grandmother 56       breast   Diabetes Maternal Grandmother    Colon cancer Neg Hx    Esophageal cancer Neg Hx    Rectal cancer Neg Hx    Stomach cancer Neg Hx      Current Outpatient Medications:    acetaminophen (TYLENOL) 500  MG tablet, Take 500 mg by mouth every 6 (six) hours as needed for moderate pain or headache., Disp: , Rfl:    albuterol (VENTOLIN HFA) 108 (90 Base) MCG/ACT inhaler, Inhale 1-2 puffs into the lungs every 6 (six) hours as needed for wheezing or shortness of breath., Disp: , Rfl:    atorvastatin (LIPITOR) 20 MG tablet, TAKE 1 TABLET(20 MG) BY MOUTH DAILY, Disp: 90 tablet, Rfl: 3   azelastine (ASTELIN) 0.1 % nasal spray, Place 1 spray into both nostrils 2 (two) times daily., Disp: , Rfl: 3   Calcium Carb-Cholecalciferol (CALCIUM 600 + D PO), Take 1 tablet by mouth daily., Disp: , Rfl:    denosumab (PROLIA) 60 MG/ML SOSY injection, Inject 60 mg into the skin every 6 (six) months., Disp: , Rfl:    fluticasone (FLONASE) 50 MCG/ACT nasal spray, Place 1  spray into both nostrils daily., Disp: , Rfl:    gabapentin (NEURONTIN) 300 MG capsule, Take 1 capsule (300 mg total) by mouth 3 (three) times daily., Disp: 90 capsule, Rfl: 3   levocetirizine (XYZAL) 5 MG tablet, Take 5 mg by mouth every evening. , Disp: , Rfl:    montelukast (SINGULAIR) 10 MG tablet, Take 10 mg by mouth at bedtime., Disp: , Rfl:    spironolactone (ALDACTONE) 25 MG tablet, Take 1 tablet (25 mg total) by mouth daily., Disp: 90 tablet, Rfl: 3   valACYclovir (VALTREX) 500 MG tablet, Take 1 tablet po bid x 5 days as needed for outbreak, Disp: 30 tablet, Rfl: 1   valsartan (DIOVAN) 160 MG tablet, Take 1 tablet (160 mg total) by mouth daily., Disp: 90 tablet, Rfl: 3  Current Facility-Administered Medications:    gi cocktail (Maalox,Lidocaine,Donnatal), 30 mL, Oral, Once, [employer], Yvonne R, DO      Objective:   Vitals:   02/05/21 1400  BP: 116/68  Pulse: 78  Temp: 98.4 F (36.9 C)  TempSrc: Oral  SpO2: 98%  Weight: 125 lb 6.4 oz (56.9 kg)  Height: _0  (1.6 m)    Estimated body mass index is 22.21 kg/m as calculated from the following:   Height as of this encounter: _1  (1.6 m).   Weight as of this encounter: 125 lb 6.4 oz (56.9 kg).  _2 @  Filed Weights   02/05/21 1400  Weight: 125 lb 6.4 oz (56.9 kg)     Physical Exam  General: No distress. Looks normal Neuro: Alert and Oriented x 3. GCS 15. Speech normal Psych: Pleasant Resp:  Barrel Chest - no.  Wheeze - no, Crackles - no, No overt respiratory distress CVS: Normal heart sounds. Murmurs - no Ext: Stigmata of Connective Tissue Disease - no HEENT: Normal upper airway. PEERL +. No post nasal drip        Assessment:       ICD-10-CM   1. Irritable larynx  J38.7     2. Lung nodules  R91.8     3. History of cigarette smoking  Z87.891          Plan:     Patient Instructions     ICD-10-CM   1. Chronic cough  R05.3     2. Irritable larynx  J38.7     3. Lung nodules  R91.8      4. History of cigarette smoking  Z87.891      #cough Your cough neuropathy . Cough significantly improved after restart of gabapentin full dose in August 2022 and stopping fosfamax OCt 2022  #Lung Nodules  -  Lung nodules stable November 2021 through May 2022 [largest nodule 8 mm right lower lobe]   Plan -Continue gabapentin but reduce to 353m twice daily -Avoid getting rhinitis or respiratory infection which can then make your cough worse -Do CT scan of the chest without contrast in April 2023 (10 months from prior scan)   Followup April 2023 with nurse practitioner or Dr. RChase Callerbut after completing CT chest  -RSI cough score at follow-up  -At this visit we will talk about tapering down on gabapentin  -     SIGNATURE    Dr. MBrand Males M.D., F.C.C.P,  Pulmonary and Critical Care Medicine Staff Physician, CMount CroghanDirector - Interstitial Lung Disease  Program  [place]ulmonary [place] [id number] Pager: [phone] If no answer or between  15:00h - 7:00h: call [phone] Telephone: [phone]  2:29 PM 02/05/2021

## 2021-02-05 NOTE — Patient Instructions (Addendum)
ICD-10-CM   1. Chronic cough  R05.3     2. Irritable larynx  J38.7     3. Lung nodules  R91.8     4. History of cigarette smoking  Z87.891      #cough Your cough neuropathy . Cough significantly improved after restart of gabapentin full dose in August 2022 and stopping fosfamax OCt 2022  #Lung Nodules  - Lung nodules stable November 2021 through May 2022 [largest nodule 8 mm right lower lobe]   Plan -Continue gabapentin but reduce to 300mg  twice daily -Avoid getting rhinitis or respiratory infection which can then make your cough worse -Do CT scan of the chest without contrast in April 2023 (10 months from prior scan)   Followup April 2023 with nurse practitioner or Dr. Chase Caller but after completing CT chest  -RSI cough score at follow-up  -At this visit we will talk about tapering down on gabapentin  -

## 2021-02-09 ENCOUNTER — Ambulatory Visit (HOSPITAL_BASED_OUTPATIENT_CLINIC_OR_DEPARTMENT_OTHER): Payer: Medicare PPO | Admitting: Physician Assistant

## 2021-02-09 ENCOUNTER — Other Ambulatory Visit: Payer: Self-pay

## 2021-02-09 ENCOUNTER — Encounter (HOSPITAL_BASED_OUTPATIENT_CLINIC_OR_DEPARTMENT_OTHER): Payer: Self-pay | Admitting: Family

## 2021-02-09 VITALS — BP 138/84 | HR 64 | Ht 63.0 in | Wt 126.0 lb

## 2021-02-09 DIAGNOSIS — I2510 Atherosclerotic heart disease of native coronary artery without angina pectoris: Secondary | ICD-10-CM | POA: Diagnosis not present

## 2021-02-09 DIAGNOSIS — E785 Hyperlipidemia, unspecified: Secondary | ICD-10-CM | POA: Diagnosis not present

## 2021-02-09 DIAGNOSIS — I10 Essential (primary) hypertension: Secondary | ICD-10-CM | POA: Diagnosis not present

## 2021-02-09 MED ORDER — NITROGLYCERIN 0.4 MG SL SUBL: 0.4000 mg | SUBLINGUAL_TABLET | SUBLINGUAL | 3 refills | Status: AC | PRN

## 2021-02-09 NOTE — Patient Instructions (Addendum)
Medication Instructions:  Your physician has recommended you make the following change in your medication:   Start: Aspirin 81mg  daily   Start: Nitroglycerin (Nitrostat) 0.4mg  SL tablet under the tongue every 5 minutes as needed for chest pain   The proper use and anticipated side effects of nitroglycerine has been carefully explained.  If a single episode of chest pain is not relieved by one tablet, the patient will try another within 5 minutes; and if this doesn't relieve the pain, the patient is instructed to call 911 for transportation to an emergency department.   *If you need a refill on your cardiac medications before your next appointment, please call your pharmacy*   Lab Work: None ordered today   Testing/Procedures: None ordered today    Follow-Up: At [facility], you and your health needs are our priority.  As part of our continuing mission to provide you with exceptional heart care, we have created designated Provider Care Teams.  These Care Teams include your primary Cardiologist (physician) and Advanced Practice Providers (APPs -  Physician Assistants and Nurse Practitioners) who all work together to provide you with the care you need, when you need it.  We recommend signing up for the patient portal called "MyChart".  Sign up information is provided on this After Visit Summary.  MyChart is used to connect with patients for Virtual Visits (Telemedicine).  Patients are able to view lab/test results, encounter notes, upcoming appointments, etc.  Non-urgent messages can be sent to your provider as well.   To learn more about what you can do with MyChart, go to [email or web address].    Your next appointment:   3-4 month(s)  The format for your next appointment:   In Person  Provider:   Fransico Him, MD  or APP    Other Instructions None today

## 2021-02-09 NOTE — Progress Notes (Signed)
Office Visit    Patient Name: [name] Date of Encounter: 02/09/2021  PCP:  Lowne Chase, [facility]  Cardiologist:  Fransico Him, MD  Advanced Practice Provider:  No care team member to display Electrophysiologist:  None    Chief Complaint    [name] is a 65 y.o. female with a hx of chronic cough (follows with pulmonary),  presents today for follow up after ED visit for chest pain.   Past Medical History    Past Medical History:  Diagnosis Date   Allergy    Cataract    Chronic cough 01/24/2019   Costochondritis 01/24/2019   Epigastric pain 07/29/2016   FIBROIDS, UTERUS 01/18/2007   Qualifier: Diagnosis of  By: Jerold Coombe     H/O Bell's palsy 05/2010   HYPERTENSION 03/24/2007   Qualifier: Diagnosis of  By: Ronnald Ramp CMA, Chemira     MENORRHAGIA, PERIMENOPAUSAL 01/13/2007   Qualifier: Diagnosis of  By: Jerold Coombe     MOLE 05/07/2010   Qualifier: Diagnosis of  By: Jerold Coombe     Osteopenia 08/23/2014   RUQ pain 07/29/2016   Spontaneous pneumothorax 2005   R, s/p tube, then VATS   Spontaneous tension pneumothorax 03/24/2007   Qualifier: Diagnosis of  By: Jerold Coombe     Past Surgical History:  Procedure Laterality Date   BRONCHIAL WASHINGS  08/06/2019   Procedure: BRONCHIAL lavage;  Surgeon: Brand Males, MD;  Location: [facility];  Service: Endoscopy;;   CESAREAN SECTION     CHEST TUBE INSERTION  2005   spontaneus Pneumothorax   COLONOSCOPY     SPIROMETRY  01/20/2015   Dr. Donneta Romberg   VIDEO ASSISTED THORACOSCOPY  2005   VIDEO BRONCHOSCOPY N/A 08/06/2019   Procedure: VIDEO BRONCHOSCOPY WITHOUT FLUORO;  Surgeon: Brand Males, MD;  Location: [facility];  Service: Endoscopy;  Laterality: N/A;   WISDOM TOOTH EXTRACTION      Allergies  No Known Allergies  History of Present Illness    [name] is a 65 y.o. female with a hx of CAD, HTN, bronchiectasis, HLD, spontaneous PTX s/p  VATS last seen 08/06/20 by Dr. Radford Pax.  She had previous myoview 07/2019 which was low risk study with normal LVEF 67%. Coronary calcium score of 38 in July 2021 which placed her in the 69th percentile for age and sex matched control. She was last seen 07/2020 doing well from a cardiac perspective.   ED visit 01/24/21 with chest pain described as pressure, grabbing, heaviness. She had troponin x2 which were unremarkable.   She presents today for follow up. She has felt fine since her ED visit. She has not had any repeat chest pain or any other symptoms. She continues to do Maxeys at home routinely without pain. At the time of her chest pain she was recently told that her daughter has been legally married since 2020 and didn't tell anyone. This caused her some stress which may have made her symptoms worse. She does describe the chest pain she felt as heaviness and pressure but no jaw pain or arm pain. She stated it was intermittent for 3 days. Her mother had a stroke in her late 15s and her father had his first heart attack in his 53s and then had stents placed in his early 54s. Her BP has been well controlled at home and is usually in the 300T systolic. She is slightly elevated today at  138/84 which is high for her. Her last lipid panel was 10/2020 which showed LDL 52, total cholesterol 131, HDL 66, and Triglycerides 65. She has been compliant on all her medications. Since she is feeling well today I do not think there is any rush to do any additional studies. I did outline our three options which include coronary CTA, stress test, and cardiac cath. She wants to wait for her appointment with Dr. Radford Pax to make a decision on further workup. She had a stress test last year that was a low-risk study. I started her on aspirin 81mg  today since there was no clear reason she was not on it. I also added some as needed nitroglycerin if she does experience chest pain again. She is to call our office if she notices pain  like she had before she went to the ED.   Reports no shortness of breath nor dyspnea on exertion. Reports no chest pain, pressure, or tightness. No edema, orthopnea, PND. Reports no palpitations.    EKGs/Labs/Other Studies Reviewed:   The following studies were reviewed today:  CT cardiac scoring 08/2019 IMPRESSION: Coronary calcium score of 38. This was 69th percentile for age and sex matched control.  Myoview 07/2019 Nuclear stress EF: 67%. There was no ST segment deviation noted during stress. The study is normal. This is a low risk study. The left ventricular ejection fraction is hyperdynamic (>65%).   EKG:  EKG is not ordered today.    Recent Labs: 01/24/2021: ALT 22; BUN 9; Creatinine, Ser 0.57; Hemoglobin 12.4; Platelets 168; Potassium 3.5; Sodium 133  Recent Lipid Panel    Component Value Date/Time   CHOL 131 11/06/2020 1035   CHOL 173 09/12/2019 1029   TRIG 65.0 11/06/2020 1035   HDL 66.90 11/06/2020 1035   HDL 75 09/12/2019 1029   CHOLHDL 2 11/06/2020 1035   VLDL 13.0 11/06/2020 1035   LDLCALC 52 11/06/2020 1035   LDLCALC 62 11/05/2019 1353   LDLDIRECT 111.7 05/07/2010 1333    Home Medications   Current Meds  Medication Sig   acetaminophen (TYLENOL) 500 MG tablet Take 500 mg by mouth every 6 (six) hours as needed for moderate pain or headache.   albuterol (VENTOLIN HFA) 108 (90 Base) MCG/ACT inhaler Inhale 1-2 puffs into the lungs every 6 (six) hours as needed for wheezing or shortness of breath.   atorvastatin (LIPITOR) 20 MG tablet TAKE 1 TABLET(20 MG) BY MOUTH DAILY   azelastine (ASTELIN) 0.1 % nasal spray Place 1 spray into both nostrils 2 (two) times daily.   denosumab (PROLIA) 60 MG/ML SOSY injection Inject 60 mg into the skin every 6 (six) months.   FIBER PO Take by mouth daily. Either Benefiber or Metamucil   fluticasone (FLONASE) 50 MCG/ACT nasal spray Place 1 spray into both nostrils daily.   gabapentin (NEURONTIN) 300 MG capsule Take 1 capsule (300  mg total) by mouth 3 (three) times daily.   levocetirizine (XYZAL) 5 MG tablet Take 5 mg by mouth every evening.    montelukast (SINGULAIR) 10 MG tablet Take 10 mg by mouth at bedtime.   nitroGLYCERIN (NITROSTAT) 0.4 MG SL tablet Place 1 tablet (0.4 mg total) under the tongue every 5 (five) minutes as needed for chest pain.   spironolactone (ALDACTONE) 25 MG tablet Take 1 tablet (25 mg total) by mouth daily.   UNABLE TO FIND Take by mouth daily. Med Name: ViActiv   valACYclovir (VALTREX) 500 MG tablet Take 1 tablet po bid x 5 days as  needed for outbreak   valsartan (DIOVAN) 160 MG tablet Take 1 tablet (160 mg total) by mouth daily.   [DISCONTINUED] Calcium Carb-Cholecalciferol (CALCIUM 600 + D PO) Take 1 tablet by mouth daily.   Current Facility-Administered Medications for the 02/09/21 encounter (Office Visit) with Loel Dubonnet, NP  Medication   gi cocktail (Maalox,Lidocaine,Donnatal)     Review of Systems      All other systems reviewed and are otherwise negative except as noted above.  Physical Exam    VS:  BP 138/84    Pulse 64    Ht 5\' 3"  (1.6 m)    Wt 126 lb (57.2 kg)    BMI 22.32 kg/m  , BMI Body mass index is 22.32 kg/m.  Wt Readings from Last 3 Encounters:  02/09/21 126 lb (57.2 kg)  02/05/21 125 lb 6.4 oz (56.9 kg)  01/24/21 120 lb (54.4 kg)     GEN: Well nourished, well developed, in no acute distress. HEENT: normal. Cardiac: RRR, no murmurs, rubs, or gallops. No clubbing, cyanosis, edema.  Radials/PT 2+ and equal bilaterally.  Respiratory:  Respirations regular and unlabored, clear to auscultation bilaterally. GI: Soft, nontender, nondistended. MS: No deformity or atrophy. Skin: Warm and dry, no rash. Neuro:  Strength and sensation are intact. Psych: Normal affect.  Assessment & Plan    CAD -  -Continue GDMT: ASA, statin, Diovan -HR 64 so will hold off on BB therapy -No chest pain since ED visit -Remains active and eating a mediterranean diet  HLD,  LDL goal <70 - 10/2020 LDL 52, HDL 66, total cholesterol 131 -Maintaining a heart healthy diet -Her PCP usually draws her lipid panel and she will see them in March  HTN -  -Slightly elevated today in the clinic -She is usually 552Z systolic at home -She will continue a low-sodium diet and continue to take her BP at home  Disposition: Follow up in 3 month(s) with Fransico Him, MD or APP.  Signed, Elgie Collard, PA-C 02/09/2021, 3:29 PM [facility]

## 2021-03-04 ENCOUNTER — Telehealth: Payer: Self-pay

## 2021-03-04 NOTE — Telephone Encounter (Signed)
As of December 12, 2020  A previous request for Prolia was submitted and Approved from 02/22/2021 to 02/21/2022.

## 2021-04-14 DIAGNOSIS — M720 Palmar fascial fibromatosis [Dupuytren]: Secondary | ICD-10-CM | POA: Diagnosis not present

## 2021-04-14 DIAGNOSIS — M65331 Trigger finger, right middle finger: Secondary | ICD-10-CM | POA: Diagnosis not present

## 2021-04-16 ENCOUNTER — Other Ambulatory Visit: Payer: Self-pay | Admitting: Internal Medicine

## 2021-04-20 ENCOUNTER — Encounter: Payer: Self-pay | Admitting: Family Medicine

## 2021-05-07 ENCOUNTER — Ambulatory Visit: Payer: Medicare PPO | Admitting: Family Medicine

## 2021-05-07 ENCOUNTER — Encounter: Payer: Self-pay | Admitting: Family Medicine

## 2021-05-07 VITALS — BP 118/80 | HR 76 | Temp 98.4°F | Resp 16 | Ht 63.0 in | Wt 120.4 lb

## 2021-05-07 DIAGNOSIS — I10 Essential (primary) hypertension: Secondary | ICD-10-CM | POA: Diagnosis not present

## 2021-05-07 DIAGNOSIS — E785 Hyperlipidemia, unspecified: Secondary | ICD-10-CM

## 2021-05-07 DIAGNOSIS — R519 Headache, unspecified: Secondary | ICD-10-CM

## 2021-05-07 DIAGNOSIS — M720 Palmar fascial fibromatosis [Dupuytren]: Secondary | ICD-10-CM

## 2021-05-07 LAB — COMPREHENSIVE METABOLIC PANEL
ALT: 17 U/L (ref 0–35)
AST: 15 U/L (ref 0–37)
Albumin: 4.6 g/dL (ref 3.5–5.2)
Alkaline Phosphatase: 60 U/L (ref 39–117)
BUN: 10 mg/dL (ref 6–23)
CO2: 30 mEq/L (ref 19–32)
Calcium: 9.1 mg/dL (ref 8.4–10.5)
Chloride: 100 mEq/L (ref 96–112)
Creatinine, Ser: 0.61 mg/dL (ref 0.40–1.20)
GFR: 92.07 mL/min (ref >60.00)
Glucose, Bld: 102 mg/dL — ABNORMAL HIGH (ref 70–99)
Potassium: 4 mEq/L (ref 3.5–5.1)
Sodium: 136 mEq/L (ref 135–145)
Total Bilirubin: 0.5 mg/dL (ref 0.2–1.2)
Total Protein: 6.9 g/dL (ref 6.0–8.3)

## 2021-05-07 LAB — LIPID PANEL
Cholesterol: 140 mg/dL (ref 0–200)
HDL: 72.7 mg/dL (ref >39.00)
LDL Cholesterol: 51 mg/dL (ref 0–99)
NonHDL: 66.85
Total CHOL/HDL Ratio: 2
Triglycerides: 78 mg/dL (ref 0.0–149.0)
VLDL: 15.6 mg/dL (ref 0.0–40.0)

## 2021-05-07 MED ORDER — IPRATROPIUM BROMIDE 0.06 % NA SOLN: 2.0000 | Freq: Four times a day (QID) | NASAL | 12 refills | Status: DC

## 2021-05-07 NOTE — Progress Notes (Signed)
? ?Subjective:  ? ?By signing my name below, I, [name], attest that this documentation has been prepared under the direction and in the presence of Ann Held, DO. 05/07/2021 ? ? ? Patient ID: [name], female    DOB: [birth year], 66 y.o.   MRN: [id number] ? ?Chief Complaint  ?Patient presents with  ? Hypertension  ? Hyperlipidemia  ? Follow-up  ? ? ?Hypertension ?Associated symptoms include headaches. Pertinent negatives include no blurred vision, chest pain, malaise/fatigue, palpitations or shortness of breath.  ?Hyperlipidemia ?Pertinent negatives include no chest pain or shortness of breath.  ?Patient is in today for a office visit.  ?She complains of headaches. She thinks it is a sinus headache. She has tried Flonase, Astelin, and OTC tylenol to manage her symptoms and found no relief.  ?She complains of dull flank pain. Her pain does not radiate. She denies having any urinary issues. She thinks taking many different medications is contributing to her pain. She has taken only 1 Prolia treatment so far. She has a history of sciatic nerve pain. She has taken medication for that issues previously and found relief. She is planning on taking it again to see if her symptoms improve.  ?She reports recently being diagnosed with dupuytren's contracture on her right hand.  ?Her blood pressure is doing well during this visit.  ?BP Readings from Last 3 Encounters:  ?05/07/21 118/80  ?02/09/21 138/84  ?02/05/21 116/68  ? ?Pulse Readings from Last 3 Encounters:  ?05/07/21 76  ?02/09/21 64  ?02/05/21 78  ? ? ?Past Medical History:  ?Diagnosis Date  ? Allergy   ? Cataract   ? Chronic cough 01/24/2019  ? Costochondritis 01/24/2019  ? Epigastric pain 07/29/2016  ? FIBROIDS, UTERUS 01/18/2007  ? Qualifier: Diagnosis of  By: Jerold Coombe    ? H/O Bell's palsy 05/2010  ? HYPERTENSION 03/24/2007  ? Qualifier: Diagnosis of  By: Ronnald Ramp CMA, Chemira    ? MENORRHAGIA, PERIMENOPAUSAL 01/13/2007  ? Qualifier:  Diagnosis of  By: Jerold Coombe    ? MOLE 05/07/2010  ? Qualifier: Diagnosis of  By: Jerold Coombe    ? Osteopenia 08/23/2014  ? RUQ pain 07/29/2016  ? Spontaneous pneumothorax 2005  ? R, s/p tube, then VATS  ? 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 Qualifier: Diagnosis of  By: Jerold Coombe    ? ? ?Past Surgical History:  ?Procedure Laterality Date  ? BRONCHIAL WASHINGS  08/06/2019  ? Procedure: BRONCHIAL lavage;  Surgeon: Brand Males, MD;  Location: [facility];  Service: Endoscopy;;  ? CESAREAN SECTION    ? CHEST TUBE INSERTION  2005  ? spontaneus Pneumothorax  ? COLONOSCOPY    ? SPIROMETRY  01/20/2015  ? Dr. Donneta Romberg  ? VIDEO ASSISTED THORACOSCOPY  2005  ? VIDEO BRONCHOSCOPY N/A 08/06/2019  ? Procedure: VIDEO BRONCHOSCOPY WITHOUT FLUORO;  Surgeon: Brand Males, MD;  Location: [facility];  Service: Endoscopy;  Laterality: N/A;  ? WISDOM TOOTH EXTRACTION    ? ? ?Family History  ?Problem Relation Age of Onset  ? Hypertension Mother   ? Stroke Mother   ? Heart disease Father   ? Hypertension Father   ? Cancer Maternal Grandmother 72  ?     breast  ? Diabetes Maternal Grandmother   ? Colon cancer Neg Hx   ? Esophageal cancer Neg Hx   ? Rectal cancer Neg Hx   ? Stomach cancer Neg Hx   ? ? ?Social History  ? ?  Socioeconomic History  ? Marital status: Married  ?  Spouse name: Not on file  ? Number of children: 3  ? Years of education: Not on file  ? Highest education level: Not on file  ?Occupational History  ? Occupation: [employer]  ?  Employer: [employer]  ?Tobacco Use  ? Smoking status: Former  ?  Packs/day: 1.00  ?  Years: 14.00  ?  Pack years: 14.00  ?  Types: Cigarettes  ?  Quit date: 07/11/1982  ?  Years since quitting: 38.8  ? Smokeless tobacco: Never  ?Vaping Use  ? Vaping Use: Never used  ?Substance and Sexual Activity  ? Alcohol use: Yes  ?  Alcohol/week: 0.0 standard drinks  ?  Comment: rare  ? Drug use: No  ? Sexual activity: Yes  ?  Partners:  Male  ?  Birth control/protection: Post-menopausal  ?  Comment: 1st intercourse 66 yo-Fewer than 5 partners  ?Other Topics Concern  ? Not on file  ?Social History Narrative  ? Exercise-- jazzersize  3x a week   ? ?Social Determinants of Health  ? ?Financial Resource Strain: Low Risk   ? Difficulty of Paying Living Expenses: Not hard at all  ?Food Insecurity: No Food Insecurity  ? Worried About Charity fundraiser in the Last Year: Never true  ? Ran Out of Food in the Last Year: Never true  ?Transportation Needs: No Transportation Needs  ? Lack of Transportation (Medical): No  ? Lack of Transportation (Non-Medical): No  ?Physical Activity: Sufficiently Active  ? Days of Exercise per Week: 6 days  ? Minutes of Exercise per Session: 40 min  ?Stress: No Stress Concern Present  ? Feeling of Stress : Not at all  ?Social Connections: Moderately Isolated  ? Frequency of Communication with Friends and Family: More than three times a week  ? Frequency of Social Gatherings with Friends and Family: More than three times a week  ? Attends Religious Services: Never  ? Active Member of Clubs or Organizations: No  ? Attends Archivist Meetings: Never  ? Marital Status: Married  ?Intimate Partner Violence: Not At Risk  ? Fear of Current or Ex-Partner: No  ? Emotionally Abused: No  ? Physically Abused: No  ? Sexually Abused: No  ? ? ?Outpatient Medications Prior to Visit  ?Medication Sig Dispense Refill  ? acetaminophen (TYLENOL) 500 MG tablet Take 500 mg by mouth every 6 (six) hours as needed for moderate pain or headache.    ? albuterol (VENTOLIN HFA) 108 (90 Base) MCG/ACT inhaler Inhale 1-2 puffs into the lungs every 6 (six) hours as needed for wheezing or shortness of breath.    ? atorvastatin (LIPITOR) 20 MG tablet TAKE 1 TABLET(20 MG) BY MOUTH DAILY 90 tablet 3  ? denosumab (PROLIA) 60 MG/ML SOSY injection Inject 60 mg into the skin every 6 (six) months.    ? FIBER PO Take by mouth daily. Either Benefiber or Metamucil     ? fluticasone (FLONASE) 50 MCG/ACT nasal spray Place 1 spray into both nostrils daily.    ? gabapentin (NEURONTIN) 300 MG capsule TAKE 1 CAPSULE(300 MG) BY MOUTH THREE TIMES DAILY 90 capsule 3  ? levocetirizine (XYZAL) 5 MG tablet Take 5 mg by mouth every evening.     ? montelukast (SINGULAIR) 10 MG tablet Take 10 mg by mouth at bedtime.    ? nitroGLYCERIN (NITROSTAT) 0.4 MG SL tablet Place 1 tablet (0.4 mg total) under the  tongue every 5 (five) minutes as needed for chest pain. 90 tablet 3  ? spironolactone (ALDACTONE) 25 MG tablet Take 1 tablet (25 mg total) by mouth daily. 90 tablet 3  ? UNABLE TO FIND Take by mouth daily. Med Name: Hinton Rao    ? valACYclovir (VALTREX) 500 MG tablet Take 1 tablet po bid x 5 days as needed for outbreak 30 tablet 1  ? valsartan (DIOVAN) 160 MG tablet Take 1 tablet (160 mg total) by mouth daily. 90 tablet 3  ? azelastine (ASTELIN) 0.1 % nasal spray Place 1 spray into both nostrils 2 (two) times daily.  3  ? ?Facility-Administered Medications Prior to Visit  ?Medication Dose Route Frequency Provider Last Rate Last Admin  ? gi cocktail (Maalox,Lidocaine,Donnatal)  30 mL Oral Once Roma Schanz R, DO      ? ? ?No Known Allergies ? ?Review of Systems  ?Constitutional:  Negative for fever and malaise/fatigue.  ?HENT:  Negative for congestion.   ?Eyes:  Negative for blurred vision.  ?Respiratory:  Negative for shortness of breath.   ?Cardiovascular:  Negative for chest pain, palpitations and leg swelling.  ?Gastrointestinal:  Negative for abdominal pain, blood in stool and nausea.  ?Genitourinary:  Negative for dysuria and frequency.  ?Musculoskeletal:  Negative for falls.  ?     (+)flank pain  ?Skin:  Negative for rash.  ?Neurological:  Positive for headaches. Negative for dizziness and loss of consciousness.  ?Endo/Heme/Allergies:  Negative for environmental allergies.  ?Psychiatric/Behavioral:  Negative for depression. The patient is not nervous/anxious.   ? ?   ?Objective:  ?   ?Physical Exam ?Vitals and nursing note reviewed.  ?Constitutional:   ?   General: She is not in acute distress. ?   Appearance: Normal appearance. She is not ill-appearing.  ?HENT:  ?   Head: Normocephalic

## 2021-05-07 NOTE — Assessment & Plan Note (Signed)
Well controlled, no changes to meds. Encouraged heart healthy diet such as the DASH diet and exercise as tolerated.  °

## 2021-05-07 NOTE — Patient Instructions (Signed)

## 2021-05-07 NOTE — Assessment & Plan Note (Signed)
Encourage heart healthy diet such as MIND or DASH diet, increase exercise, avoid trans fats, simple carbohydrates and processed foods, consider a krill or fish or flaxseed oil cap daily.  °

## 2021-05-07 NOTE — Assessment & Plan Note (Signed)
Per ortho.  

## 2021-05-13 DIAGNOSIS — Z79620 Long term (current) use of immunosuppressive biologic: Secondary | ICD-10-CM | POA: Diagnosis not present

## 2021-05-13 DIAGNOSIS — J310 Chronic rhinitis: Secondary | ICD-10-CM | POA: Diagnosis not present

## 2021-05-13 DIAGNOSIS — J45909 Unspecified asthma, uncomplicated: Secondary | ICD-10-CM | POA: Diagnosis not present

## 2021-05-13 DIAGNOSIS — E785 Hyperlipidemia, unspecified: Secondary | ICD-10-CM | POA: Diagnosis not present

## 2021-05-13 DIAGNOSIS — Z7982 Long term (current) use of aspirin: Secondary | ICD-10-CM | POA: Diagnosis not present

## 2021-05-13 DIAGNOSIS — Z79899 Other long term (current) drug therapy: Secondary | ICD-10-CM | POA: Diagnosis not present

## 2021-05-13 DIAGNOSIS — I2510 Atherosclerotic heart disease of native coronary artery without angina pectoris: Secondary | ICD-10-CM | POA: Diagnosis not present

## 2021-05-13 DIAGNOSIS — I10 Essential (primary) hypertension: Secondary | ICD-10-CM | POA: Diagnosis not present

## 2021-05-13 DIAGNOSIS — M810 Age-related osteoporosis without current pathological fracture: Secondary | ICD-10-CM | POA: Diagnosis not present

## 2021-05-14 DIAGNOSIS — M720 Palmar fascial fibromatosis [Dupuytren]: Secondary | ICD-10-CM | POA: Diagnosis not present

## 2021-05-14 DIAGNOSIS — M65331 Trigger finger, right middle finger: Secondary | ICD-10-CM | POA: Diagnosis not present

## 2021-05-27 ENCOUNTER — Encounter: Payer: Self-pay | Admitting: Family Medicine

## 2021-05-28 ENCOUNTER — Other Ambulatory Visit: Payer: Self-pay | Admitting: Family Medicine

## 2021-05-28 ENCOUNTER — Ambulatory Visit: Payer: Medicare PPO | Admitting: Internal Medicine

## 2021-05-28 DIAGNOSIS — U071 COVID-19: Secondary | ICD-10-CM

## 2021-05-28 MED ORDER — NIRMATRELVIR/RITONAVIR (PAXLOVID)TABLET: 3.0000 | ORAL_TABLET | Freq: Two times a day (BID) | ORAL | 0 refills | Status: AC

## 2021-05-29 ENCOUNTER — Encounter: Payer: Self-pay | Admitting: Nurse Practitioner

## 2021-05-29 ENCOUNTER — Telehealth (INDEPENDENT_AMBULATORY_CARE_PROVIDER_SITE_OTHER): Payer: Medicare PPO | Admitting: Nurse Practitioner

## 2021-05-29 DIAGNOSIS — U071 COVID-19: Secondary | ICD-10-CM | POA: Diagnosis not present

## 2021-05-29 DIAGNOSIS — R053 Chronic cough: Secondary | ICD-10-CM

## 2021-05-29 NOTE — Patient Instructions (Signed)
Continue Albuterol inhaler 2 puffs every 6 hours as needed for shortness of breath or wheezing. Notify if symptoms persist despite rescue inhaler/neb use. ?Continue singular 10 mg At bedtime  ?Continue Xyzal 5 mg daily for allergies ?Continue Flonase nasal spray 2 sprays each nostril daily ?Continue Atrovent nasal spray 2 sprays four times daily  ?Continue Tylenol over the counter as directed  ? ?-Complete Paxlovid as previously directed ?-Saline nasal spray 2-3 times a day or irrigation 1-2 times a day as needed for nasal congestion until symptoms improve ?-Mucinex DM twice daily as needed until symptoms improve ?-Ibuprofen over the counter as directed for headaches/pain until symptoms improve ? ?Follow up as previously scheduled with Dr. Chase Caller. If symptoms do not improve or worsen, please contact office for sooner follow up or seek emergency care. ?

## 2021-05-29 NOTE — Assessment & Plan Note (Signed)
Positive 05/27/2021.  Symptom onset 05/26/2021.  Started on Paxlovid today, prescribed by PCP.  Previous kidney function normal.  Do not see any significant interactions with current medication list.  Advised supportive care measures. PRN albuterol. Monitor for worsening symptoms and seek further care over the weekend if respiratory distress develops.  ? ?Patient Instructions  ?Continue Albuterol inhaler 2 puffs every 6 hours as needed for shortness of breath or wheezing. Notify if symptoms persist despite rescue inhaler/neb use. ?Continue singular 10 mg At bedtime  ?Continue Xyzal 5 mg daily for allergies ?Continue Flonase nasal spray 2 sprays each nostril daily ?Continue Atrovent nasal spray 2 sprays four times daily  ?Continue Tylenol over the counter as directed  ? ?-Complete Paxlovid as previously directed ?-Saline nasal spray 2-3 times a day or irrigation 1-2 times a day as needed for nasal congestion until symptoms improve ?-Mucinex DM twice daily as needed until symptoms improve ?-Ibuprofen over the counter as directed for headaches/pain until symptoms improve ? ?Follow up as previously scheduled with Dr. Chase Caller. If symptoms do not improve or worsen, please contact office for sooner follow up or seek emergency care. ? ? ?

## 2021-05-29 NOTE — Progress Notes (Signed)
? ?Patient ID: [name], female     DOB: 1955/03/10, 66 y.o.      MRN: [id number] ? ?No chief complaint on file. ? ? ?Virtual Visit via Video Note ? ?I connected with [name] on 05/29/21 at  3:00 PM EDT by a video enabled telemedicine application and verified that I am speaking with the correct person using two identifiers. ? ?Location: ?Patient: Home ?Provider: Office ?  ?I discussed the limitations of evaluation and management by telemedicine and the availability of in person appointments. The patient expressed understanding and agreed to proceed. ? ?History of Present Illness: ?66 year old female, former smoker (14 pack years) followed for chronic cough and irritable larynx. She is a patient of Dr. Golden Pop and last seen in office 02/05/2021. Past medical history significant for HTN, atherosclerosis, CAD, hx of BTX, osteopenia, fibroids, HLD. ? ?05/29/2021: Today - acute visit ?Patient presents today via virtual visit after testing positive for COVID on Wednesday of this week. She had contacted her PCP after developing symptoms on Tuesday and testing positive Wednesday for antiviral therapy. Initially, she was told she would need a visit to be prescribed it but they did not have any availability so she contacted our office to set up an appointment. Today, she reports that she was able to speak with her PCP yesterday and she called in Paxlovid for her, which she started this morning. She wanted to discuss symptom management and see if there was anything else she could do besides tylenol for sinus pressure/headaches. Her primary symptoms are sinus congestion and drainage, headaches unrelieved by tylenol, and a very occasional dry cough. She denies any fevers, shortness of breath, body aches or chills. Her breathing is stable overall and she has not required use of her albuterol rescue inhaler. She continues to use flonase and Atrovent nasal sprays, Xyzal and Singulair daily.  ? ?No Known  Allergies ?Immunization History  ?Administered Date(s) Administered  ? DTaP 02/03/2019  ? Fluad Quad(high Dose 65+) 11/03/2018, 11/06/2020  ? Influenza Whole 11/30/2006, 12/13/2007, 01/01/2009, 12/31/2009  ? Influenza, High Dose Seasonal PF 04/19/2016, 04/12/2017, 03/14/2018, 04/30/2019  ? Influenza,inj,Quad PF,6+ Mos 11/27/2017  ? Influenza-Unspecified 10/24/2013, 11/28/2016, 11/23/2019  ? PFIZER(Purple Top)SARS-COV-2 Vaccination 03/31/2019, 04/25/2019, 11/23/2019, 09/01/2020, 12/03/2020  ? Pneumococcal Conjugate-13 11/03/2018  ? Pneumococcal Polysaccharide-23 04/12/2017, 03/14/2018, 04/30/2019, 11/05/2019, 11/04/2020  ? Td 08/28/2002, 04/18/2008  ? Tdap 01/24/2019  ? Zoster Recombinat (Shingrix) 11/28/2016, 02/04/2017  ? Zoster, Live 08/20/2013  ? ?Past Medical History:  ?Diagnosis Date  ? Allergy   ? Cataract   ? Chronic cough 01/24/2019  ? Costochondritis 01/24/2019  ? Epigastric pain 07/29/2016  ? FIBROIDS, UTERUS 01/18/2007  ? Qualifier: Diagnosis of  By: Jerold Coombe    ? H/O Bell's palsy 05/2010  ? HYPERTENSION 03/24/2007  ? Qualifier: Diagnosis of  By: Ronnald Ramp CMA, Chemira    ? MENORRHAGIA, PERIMENOPAUSAL 01/13/2007  ? Qualifier: Diagnosis of  By: Jerold Coombe    ? MOLE 05/07/2010  ? Qualifier: Diagnosis of  By: Jerold Coombe    ? Osteopenia 08/23/2014  ? RUQ pain 07/29/2016  ? Spontaneous pneumothorax 2005  ? R, s/p tube, then VATS  ? 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 Qualifier: Diagnosis of  By: Jerold Coombe    ? ? ?Tobacco History: ?Social History  ? ?Tobacco Use  ?Smoking Status Former  ? Packs/day: 1.00  ? Years: 14.00  ? Pack years: 14.00  ? Types: Cigarettes  ? Quit date: 07/11/1982  ?  Years since quitting: 38.9  ?Smokeless Tobacco Never  ? ?Counseling given: Not Answered ? ? ?Outpatient Medications Prior to Visit  ?Medication Sig Dispense Refill  ? acetaminophen (TYLENOL) 500 MG tablet Take 500 mg by mouth every 6 (six) hours as needed for moderate pain or headache.    ?  albuterol (VENTOLIN HFA) 108 (90 Base) MCG/ACT inhaler Inhale 1-2 puffs into the lungs every 6 (six) hours as needed for wheezing or shortness of breath.    ? atorvastatin (LIPITOR) 20 MG tablet TAKE 1 TABLET(20 MG) BY MOUTH DAILY 90 tablet 3  ? denosumab (PROLIA) 60 MG/ML SOSY injection Inject 60 mg into the skin every 6 (six) months.    ? FIBER PO Take by mouth daily. Either Benefiber or Metamucil    ? fluticasone (FLONASE) 50 MCG/ACT nasal spray Place 1 spray into both nostrils daily.    ? gabapentin (NEURONTIN) 300 MG capsule TAKE 1 CAPSULE(300 MG) BY MOUTH THREE TIMES DAILY 90 capsule 3  ? ipratropium (ATROVENT) 0.06 % nasal spray Place 2 sprays into both nostrils 4 (four) times daily. 15 mL 12  ? levocetirizine (XYZAL) 5 MG tablet Take 5 mg by mouth every evening.     ? montelukast (SINGULAIR) 10 MG tablet Take 10 mg by mouth at bedtime.    ? nirmatrelvir/ritonavir EUA (PAXLOVID) 20 x 150 MG & 10 x '100MG'$  TABS Take 3 tablets by mouth 2 (two) times daily for 5 days. (Take nirmatrelvir 150 mg two tablets twice daily for 5 days and ritonavir 100 mg one tablet twice daily for 5 days) Patient GFR is 94 30 tablet 0  ? nitroGLYCERIN (NITROSTAT) 0.4 MG SL tablet Place 1 tablet (0.4 mg total) under the tongue every 5 (five) minutes as needed for chest pain. 90 tablet 3  ? spironolactone (ALDACTONE) 25 MG tablet Take 1 tablet (25 mg total) by mouth daily. 90 tablet 3  ? UNABLE TO FIND Take by mouth daily. Med Name: Hinton Rao    ? valACYclovir (VALTREX) 500 MG tablet Take 1 tablet po bid x 5 days as needed for outbreak 30 tablet 1  ? valsartan (DIOVAN) 160 MG tablet Take 1 tablet (160 mg total) by mouth daily. 90 tablet 3  ? ?Facility-Administered Medications Prior to Visit  ?Medication Dose Route Frequency Provider Last Rate Last Admin  ? gi cocktail (Maalox,Lidocaine,Donnatal)  30 mL Oral Once Roma Schanz R, DO      ? ?  ?Review of Systems:  ? ?Constitutional: No weight loss or gain, night sweats, fevers, chills.  +fatigue  ?HEENT: No difficulty swallowing, tooth/dental problems, or sore throat. No sneezing, itching, ear ache. +headaches, sinus pressure/congestion, nasal drainage ?CV:  No chest pain, orthopnea, PND, swelling in lower extremities, anasarca, dizziness, palpitations, syncope ?Resp: +minimal, occasional dry cough. No shortness of breath with exertion or at rest. No excess mucus or change in color of mucus. No hemoptysis. No wheezing.  No chest wall deformity ?Skin: No rash, lesions, ulcerations ?Neuro: No dizziness or lightheadedness.  ?Psych: No depression or anxiety. Mood stable.  ? ?Observations/Objective: ?Patient is well-developed, well-nourished in no acute distress; A&Ox3. Resting comfortably at home. Unlabored, regular breathing. Speech is clear and coherent with logical content, congested.  ? ?07/11/2020 CT chest wo con: Interval resolution of the 3 mm anterior right upper lobe pulmonary nodule that was previous identified.  Other scattered tiny bilateral pulmonary nodules stable, including the 8 x 4 mm right lower lobe nodule.  Evidence of biapical pleural-parenchymal scarring, also stable.  ? ?  08/02/2019 PFTs: FVC 2.87 (95), FEV1 2.25 (98), ratio 79, TLC 99%, DLCO corrected 100%.  No BD.  Normal lung function and volumes with normal diffusion capacity. ? ?Assessment and Plan: ?COVID-19 virus infection ?Positive 05/27/2021.  Symptom onset 05/26/2021.  Started on Paxlovid today, prescribed by PCP.  Previous kidney function normal.  Do not see any significant interactions with current medication list.  Advised supportive care measures. PRN albuterol. Monitor for worsening symptoms and seek further care over the weekend if respiratory distress develops.  ? ?Patient Instructions  ?Continue Albuterol inhaler 2 puffs every 6 hours as needed for shortness of breath or wheezing. Notify if symptoms persist despite rescue inhaler/neb use. ?Continue singular 10 mg At bedtime  ?Continue Xyzal 5 mg daily for  allergies ?Continue Flonase nasal spray 2 sprays each nostril daily ?Continue Atrovent nasal spray 2 sprays four times daily  ?Continue Tylenol over the counter as directed  ? ?-Complete Paxlovid as previously directed ?-Saline nasal

## 2021-05-29 NOTE — Assessment & Plan Note (Signed)
Previously stable. Monitor for worsening symptoms with COVID infection and notify if they do not improve. ?

## 2021-06-01 NOTE — Telephone Encounter (Signed)
Noted  

## 2021-06-03 ENCOUNTER — Ambulatory Visit: Payer: Medicare PPO | Admitting: Cardiology

## 2021-06-04 ENCOUNTER — Telehealth: Payer: Self-pay

## 2021-06-04 NOTE — Telephone Encounter (Signed)
Last Prolia inj 01/30/21 ?Next Prolia inj 08/01/21 ?

## 2021-06-19 ENCOUNTER — Ambulatory Visit: Payer: Medicare PPO | Admitting: Internal Medicine

## 2021-06-19 ENCOUNTER — Encounter: Payer: Self-pay | Admitting: Internal Medicine

## 2021-06-19 VITALS — BP 112/64 | HR 63 | Temp 98.2°F | Ht 63.0 in | Wt 123.6 lb

## 2021-06-19 DIAGNOSIS — R918 Other nonspecific abnormal finding of lung field: Secondary | ICD-10-CM

## 2021-06-19 DIAGNOSIS — R053 Chronic cough: Secondary | ICD-10-CM | POA: Diagnosis not present

## 2021-06-19 DIAGNOSIS — J387 Other diseases of larynx: Secondary | ICD-10-CM | POA: Diagnosis not present

## 2021-06-19 NOTE — Patient Instructions (Addendum)
?#  cough ?Your cough neuropathy . Cough significantly improved after restart of gabapentin full dose in August 2022 and stopping fosfamax OCt 2022 and on gabapentin '300mg'$  twice daily since dec 2022 ? ?#Lung Nodules ? - Lung nodules stable November 2021 through May 2022 [largest nodule 8 mm right lower lobe] ?- CT scan chest April 2023 was not done ? ? ?Plan ?-Continue gabapentin at  '300mg'$  twice daily and 07/06/21 reduce to gabapentin '300mg'$  once daily at night ?- Continue other medications ?-Avoid getting rhinitis or respiratory infection which can then make your cough worse ?-Do CT scan of the chest without contrast in 3-4 months from now ? ? ?Followup ?3-4 month with nurse practitioner or Dr. Chase Caller but after completing CT chest ? -RSI cough score at follow-up ? -At this visit we will talk about tapering down on gabapentin further ? - ?

## 2021-06-19 NOTE — Progress Notes (Signed)
? ? ? ? ?OV 06/25/2019 ? ?Subjective:  ?Patient ID: [name], female , DOB: Jun 17, 1955 , age 66 y.o. , MRN: [id number] , ADDRESS: [address] ?[place] [address] ? ? ?06/25/2019 -   ?Chief Complaint  ?Patient presents with  ? Consult  ?  Pt is here due to having a persistent dry cough for years. Pt also is being treated for a pneumothorax. States her cough could be bad at any time of the day.  ? ? ? ?HPI ?[name] 66 y.o. -has been referred by Dr. Donneta Romberg allergist for evaluation of chronic cough.  Patient tells me that she has had insidious onset of chronic cough since the 1990s.  Since then it is persistent and is at the same level.  Cough is described as moderate.  Mostly dry except when she has a flareup with an infection or bronchitis.  In 2005 she had a spontaneous right pneumothorax after respiratory flareup.  This was treated initially by tube thoracostomy and then by pleurodesis by Dr. Caffie Pinto according to the patient's history.  Since then she has not had a recurrence of pneumothorax but does continue to have chronic cough.  The cough is definitely made worse by talking a lot or by inhaling fumes such as perfume or any smoke exposure.  She says the cough during this episodes can be so severe that she almost has a whooping sound to it.  This not necessarily any associated shortness of breath.  The cough is mostly dry.  The cough does not wake her up at night.  Severity index is listed below.  Talking does make the cough worse. ? ?Cough relevant history ? ?-Hypertension: She is not on ACE inhibitor but is on ARB valsartan.  This is not associated with cough ? ?-Allergy history: She did have onset of spring allergies in 1990s.  During this time allergy testing showed positive allergies to a few things including mold and grass.  She says repeat allergy testing some years ago by Dr. Donneta Romberg revealed no allergies.  She is not on allergy shots but she is on antihistamines and nasal steroids. ? ?-Sinus  history: She is not had a CT scan of the sinus.  She does not have a significant postnasal drip but she is also on antihistamines.  She has never been evaluated by ENT. ? ?Acid reflux history: She denies any strong acid reflux.  She states in 20 years she is probably taken a few bottles of Tums in total.  She is never seen GI.  However a GI referral is under consideration by Dr. Donneta Romberg ? ?-Chronic refractory cough: She does clear her throat. ? ?- Verbal output hx: she is considered a chatty person by her family. Job was a Pharmacologist at [employer].  During this time she had to talk a lot. ? ?-Imaging: Chest x-ray 2020 personally visualized and is clear. ? ? ? ?CXR 01/23/2019 ? ?IMPRESSION: ?Stable postoperative chest with mild pleural thickening bilaterally. ?No evidence of recurrent pneumothorax or acute cardiopulmonary ?process. ?  ?  ?Electronically Signed ?  By: Richardean Sale M.D. ?  On: 01/23/2019 14:44 ? ? ?ROS ?- per HPI ? ?Results for JAIONNA, WEISSE (MRN [id number]) as of 06/25/2019 09:38 ? Ref. Range 08/20/2013 09:35 02/21/2014 10:40 08/23/2014 10:30 09/23/2015 16:35 07/29/2016 16:46 10/18/2016 10:06 10/21/2017 10:05 11/03/2018 13:56  ?Eosinophils Absolute Latest Ref Range: 0.0 - 0.7 K/uL 0.2 0.1 0.1 0.1 0.1 0.1 0.1 0.1  ? ?Results  for AKIAH, BAUCH (MRN [id number]) as of 06/25/2019 09:38 ? Ref. Range 03/22/2017 13:54  ?Influenza A, POC Latest Ref Range: Negative  Positive (A)  ?Influenza B, POC Latest Ref Range: Negative  Negative  ? ? ? ? ?07/04/2019 ov - beth walsh ?Patient presents today for 10-day follow-up to review CT imaging. She has had a chronic cough for a long times. She takes montelukast and Xyzal. Uses maintenance inahler Advair hfa 115-21 twice daily and prn albuterol hfa prior to exercise. She states that nothing helps her cough. Triggers include strong scents, tart wine and spicy food. She has post nasal drip symptoms. She gets out of breath with speaking. She has no issues with 45 min  exercise class. No significant shortness of breath. No obvious reflux symptoms. She has a known hypodensity liver stable 2014. She does have some epigastric pain. She has an upcoming apt with GI but may rescheduled until after bronchscopy.  ? ?Imaging:  ?CT showed scattered mild cylindrical bronchiectasis mid lungs with associated mild patchy tree-in-bud opacities and mild regions of subpleural scarring.  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  Few scattered solid pulmonary nodules in both lungs largest measuring 6 mm right lower lobe. 3. Two-vessel coronary atherosclerosis. ?4. Aortic Atherosclerosis (ICD10-I70.0) and Emphysema  ? ?06/30/19 CT maxillofacial - Negative for sinusitis  ? ? ?OV 08/20/2019 ? ?Subjective:  ?Patient ID: [name], female , DOB: [birth year] , age 26 y.o. , MRN: [id number] , ADDRESS: [address] ?[place] [address] ? ? ?08/20/2019 -   ?Chief Complaint  ?Patient presents with  ? Follow-up  ?  still has a dry cough  ? ? ? ?HPI ?[name] 66 y.o. -video visit for chronic cough.  This is still follow-up with bronchoscopy results.  She had bronchoscopy mid June 2021.  She had normal airway exam.  Right middle lobe lavage shows neutrophilia.  She is growing Candida albicans.  She is not immunocompromise per her history.  On this video visit she is joined by her husband.  Husband and she report that she coughs early in the morning.  She has seen Dr. Benson Norway of GI who is advised expectant approach.  Certainly he will offer more testing according to history if her symptoms persist.  Medication review shows she is on Advair MDI and also she is taking fish oil.  She says she been taking fish oil for many years.  She has never been on gabapentin but is willing to try this in the future.  She is aware of the concept of neurogenic cough. ? ? ? ?Dr Lorenza Cambridge Reflux Symptom Index (> 13-15 suggestive of LPR cough)  ?06/25/2019 ?  ?Hoarseness of problem with voice 3   ?Clearing  Of Throat 1  ?Excess throat mucus or feeling of post nasal drip 1  ?Difficulty swallowing food, liquid or tablets 0  ?Cough after eating or lying down 2  ?Breathing difficulties or choking episodes 3  ?Troublesome or annoying cough 5  ?Sensation of something sticking in throat or lump in throat 2  ?Heartburn, chest pain, indigestion, or stomach acid coming up 1  ?TOTAL 17  ? ? ? ?Results for MIAISABELLA, BACORN (MRN [id number]) as of 08/20/2019 09:24 ? Ref. Range 08/06/2019 07:28  ?Monocyte-Macrophage-Serous Fluid Latest Ref Range: 50 - 90 % 28 (L)  ?Other Cells, Fluid Latest Units: % CORRELATE WITH CYTOLOGY.  ?Fluid Type-FCT Unknown Bronch Lavag  ?Color, Fluid Latest Ref Range: YELLOW  COLORLESS (A)  ?  Total Nucleated Cell Count, Fluid Latest Ref Range: 0 - 1,000 cu mm 325  ?Lymphs, Fluid Latest Units: % 5  ?Appearance, Fluid Latest Ref Range: CLEAR  HAZY (A)  ?Neutrophil Count, Fluid Latest Ref Range: 0 - 25 % 67 (H)  ? ? ?Component 2 wk ago  ?Fungal result 1 Candida albicans Abnormal   ? ?AFB Specimen Processing Concentration   ?Acid Fast Smear Negative   ? ? ?OV 10/23/2019 ? ?Subjective:  ?Patient ID: [name], female , DOB: [birth year] , age 50 y.o. , MRN: [id number] , ADDRESS: [address] ?[place] [address] ? ? ?10/23/2019 -   ?Chief Complaint  ?Patient presents with  ? Follow-up  ?  Chronic cough  ? ? ? ?HPI ?[name] 66 y.o. -returns for follow-up of her chronic cough.  After her last visit bronchoalveolar lavage cultures continue to be negative other than Candida as above.  She thinks the Candida might have been due to the different steroid inhaler she is taking in the past.  Most recently she has been on Advair for 6 months.  She notices no difference because of it.  She continues to have a cough with clearing of the throat.  She states ice helps continue to soothe her cough.  She says it is random.  She is clearing her throat.  Stopping fish oil has not helped her.  Taking 1 week of  Diflucan has not helped her.  She wants to try other interventions to get rid of her symptoms.  However on the symptom questionnaire she is some better which she later admitted to that she was some better but not fully b

## 2021-06-29 ENCOUNTER — Encounter: Payer: Self-pay | Admitting: Family Medicine

## 2021-06-29 DIAGNOSIS — M810 Age-related osteoporosis without current pathological fracture: Secondary | ICD-10-CM

## 2021-06-30 DIAGNOSIS — H35033 Hypertensive retinopathy, bilateral: Secondary | ICD-10-CM | POA: Diagnosis not present

## 2021-06-30 DIAGNOSIS — H3562 Retinal hemorrhage, left eye: Secondary | ICD-10-CM | POA: Diagnosis not present

## 2021-06-30 DIAGNOSIS — H43813 Vitreous degeneration, bilateral: Secondary | ICD-10-CM | POA: Diagnosis not present

## 2021-06-30 DIAGNOSIS — H348322 Tributary (branch) retinal vein occlusion, left eye, stable: Secondary | ICD-10-CM | POA: Diagnosis not present

## 2021-07-07 NOTE — Telephone Encounter (Signed)
Prolia VOB initiated via [email or web address] ? ?Last Prolia inj 01/30/21 ?Next Prolia inj 08/01/21 ?

## 2021-07-12 ENCOUNTER — Encounter: Payer: Self-pay | Admitting: Internal Medicine

## 2021-07-13 NOTE — Telephone Encounter (Signed)
Will forward to PCC's to be sure they have it on file. Nothing further needed at this time.

## 2021-07-15 NOTE — Telephone Encounter (Signed)
Prior auth required for PROLIA ? ?PA PROCESS DETAILS: PA is required. PA can be initiated by calling [phone] or online at ?[email or web address] ? ?

## 2021-07-23 NOTE — Telephone Encounter (Signed)
Pt ready for scheduling on or after 08/01/21  Out-of-pocket cost due at time of visit: $40  Primary: [employer] Prolia co-insurance: 0% Admin fee co-insurance: $40  Secondary: n/a Prolia co-insurance:  Admin fee co-insurance:   Deductible: does not apply  Prior Auth: APPROVED PA# [id number] Valid: 02/22/21-02/21/22    ** This summary of benefits is an estimation of the patient's out-of-pocket cost. Exact cost may very based on individual plan coverage.

## 2021-07-23 NOTE — Telephone Encounter (Signed)
Pt scheduled  

## 2021-07-23 NOTE — Telephone Encounter (Signed)
PA# [id number] Valid: 02/22/21-02/21/22

## 2021-07-28 ENCOUNTER — Other Ambulatory Visit: Payer: Self-pay | Admitting: Cardiology

## 2021-07-28 DIAGNOSIS — I10 Essential (primary) hypertension: Secondary | ICD-10-CM

## 2021-07-29 DIAGNOSIS — M810 Age-related osteoporosis without current pathological fracture: Secondary | ICD-10-CM | POA: Diagnosis not present

## 2021-07-29 DIAGNOSIS — Z1231 Encounter for screening mammogram for malignant neoplasm of breast: Secondary | ICD-10-CM | POA: Diagnosis not present

## 2021-07-29 DIAGNOSIS — Z780 Asymptomatic menopausal state: Secondary | ICD-10-CM | POA: Diagnosis not present

## 2021-07-29 LAB — HM MAMMOGRAPHY

## 2021-07-29 LAB — HM DEXA SCAN

## 2021-08-04 ENCOUNTER — Telehealth: Payer: Self-pay

## 2021-08-04 ENCOUNTER — Ambulatory Visit (INDEPENDENT_AMBULATORY_CARE_PROVIDER_SITE_OTHER): Payer: Medicare PPO

## 2021-08-04 DIAGNOSIS — M8580 Other specified disorders of bone density and structure, unspecified site: Secondary | ICD-10-CM | POA: Diagnosis not present

## 2021-08-04 MED ORDER — DENOSUMAB 60 MG/ML ~~LOC~~ SOSY
60.0000 mg | PREFILLED_SYRINGE | Freq: Once | SUBCUTANEOUS | Status: AC
  Administered 2021-08-04: 60 mg via SUBCUTANEOUS

## 2021-08-04 NOTE — Telephone Encounter (Signed)
Prolia administered today- please update notebook.

## 2021-08-04 NOTE — Telephone Encounter (Signed)
Creft, Darlis Loan, CMA routed conversation to You 3 hours ago (9:21 AM)   Creft, Darlis Loan, CMA 3 hours ago (9:20 AM)    Prolia administered today- please update notebook.       Note

## 2021-08-04 NOTE — Progress Notes (Signed)
[name] is a 66 y.o. female presents to the office today for Prolia injection, per physician's orders. Original order: Dr Etter Sjogren Prolia  60 mg SQ was administered L arm today. Patient tolerated injection. Patient due for follow up labs/provider appt: No. Date due: 6 months (around 11/07/2021),, appt made No Patient next injection due: 6 months, appt made No- will set appointment once EOB is ran.  Creft, Darlis Loan

## 2021-08-06 ENCOUNTER — Encounter: Payer: Self-pay | Admitting: Internal Medicine

## 2021-08-22 NOTE — Telephone Encounter (Signed)
Last Prolia inj 08/04/21 Next Prolia inj due 02/04/22  Prior Auth: APPROVED PA# [id number] Valid: 02/22/21-02/21/22

## 2021-09-01 ENCOUNTER — Other Ambulatory Visit: Payer: Medicare PPO

## 2021-09-02 ENCOUNTER — Ambulatory Visit
Admission: RE | Admit: 2021-09-02 | Discharge: 2021-09-02 | Disposition: A | Discharge status: Home / Self Care | Payer: Medicare PPO | Source: Ambulatory Visit | Attending: Internal Medicine | Admitting: Internal Medicine

## 2021-09-02 DIAGNOSIS — R918 Other nonspecific abnormal finding of lung field: Secondary | ICD-10-CM | POA: Diagnosis not present

## 2021-09-02 DIAGNOSIS — I700 Atherosclerosis of aorta: Secondary | ICD-10-CM | POA: Diagnosis not present

## 2021-09-02 DIAGNOSIS — R911 Solitary pulmonary nodule: Secondary | ICD-10-CM | POA: Diagnosis not present

## 2021-09-02 DIAGNOSIS — J439 Emphysema, unspecified: Secondary | ICD-10-CM | POA: Diagnosis not present

## 2021-09-03 DIAGNOSIS — M65331 Trigger finger, right middle finger: Secondary | ICD-10-CM | POA: Diagnosis not present

## 2021-09-08 ENCOUNTER — Ambulatory Visit: Payer: Medicare PPO | Admitting: Cardiology

## 2021-09-08 ENCOUNTER — Encounter: Payer: Self-pay | Admitting: Cardiology

## 2021-09-08 VITALS — BP 120/72 | HR 72 | Ht 63.0 in | Wt 122.6 lb

## 2021-09-08 DIAGNOSIS — I10 Essential (primary) hypertension: Secondary | ICD-10-CM | POA: Diagnosis not present

## 2021-09-08 DIAGNOSIS — I700 Atherosclerosis of aorta: Secondary | ICD-10-CM

## 2021-09-08 DIAGNOSIS — R931 Abnormal findings on diagnostic imaging of heart and coronary circulation: Secondary | ICD-10-CM

## 2021-09-08 DIAGNOSIS — E785 Hyperlipidemia, unspecified: Secondary | ICD-10-CM

## 2021-09-08 MED ORDER — VALSARTAN 160 MG PO TABS: 160.0000 mg | ORAL_TABLET | Freq: Every day | ORAL | 3 refills | Status: DC

## 2021-09-08 MED ORDER — SPIRONOLACTONE 25 MG PO TABS: 25.0000 mg | ORAL_TABLET | Freq: Every day | ORAL | 3 refills | Status: DC

## 2021-09-08 NOTE — Progress Notes (Signed)
Cardiology Office Note    Date:  09/08/2021   ID:  GRACEN RINGWALD, DOB 03/06/1955, MRN [id number]  PCP:  Carollee Herter, Alferd Apa, DO  Cardiologist:  Fransico Him, MD   Chief Complaint  Patient presents with   Coronary Artery Disease   Hyperlipidemia   Hypertension     History of Present Illness:  [name] is a 66 y.o. female with a hx of chronic cough (followed with Pulmonary), HTN, spontaneous PTX s/p VATS and coronary artery calcifications. She underwent HRCT for chronic cough and was found to have coronary atherosclerosis in the RCA and LAD.   Coronary Ca score was obtained that was 38 and was 69th% for age and sex matched controls.  Lexiscan myoview 07/2019 showed no ischemia.   She is here today for followup and is doing well.  She denies any recent chest pain or pressure, SOB, DOE, PND, orthopnea, LE edema, dizziness, palpitations or syncope. She is compliant with her meds and is tolerating meds with no SE.     Past Medical History:  Diagnosis Date   Allergy    Cataract    Chronic cough 01/24/2019   Costochondritis 01/24/2019   Epigastric pain 07/29/2016   FIBROIDS, UTERUS 01/18/2007   Qualifier: Diagnosis of  By: Jerold Coombe     H/O Bell's palsy 05/2010   HYPERTENSION 03/24/2007   Qualifier: Diagnosis of  By: Ronnald Ramp CMA, Chemira     MENORRHAGIA, PERIMENOPAUSAL 01/13/2007   Qualifier: Diagnosis of  By: Jerold Coombe     MOLE 05/07/2010   Qualifier: Diagnosis of  By: Jerold Coombe     Osteopenia 08/23/2014   RUQ pain 07/29/2016   Spontaneous pneumothorax 2005   R, s/p tube, then VATS   Spontaneous tension pneumothorax 03/24/2007   Qualifier: Diagnosis of  By: Jerold Coombe      Past Surgical History:  Procedure Laterality Date   BRONCHIAL WASHINGS  08/06/2019   Procedure: BRONCHIAL lavage;  Surgeon: Brand Males, MD;  Location: [facility];  Service: Endoscopy;;   CESAREAN SECTION     CHEST TUBE INSERTION  2005   spontaneus Pneumothorax    COLONOSCOPY     SPIROMETRY  01/20/2015   Dr. Donneta Romberg   VIDEO ASSISTED THORACOSCOPY  2005   VIDEO BRONCHOSCOPY N/A 08/06/2019   Procedure: VIDEO BRONCHOSCOPY WITHOUT FLUORO;  Surgeon: Brand Males, MD;  Location: [facility];  Service: Endoscopy;  Laterality: N/A;   WISDOM TOOTH EXTRACTION      Current Medications: Current Meds  Medication Sig   acetaminophen (TYLENOL) 500 MG tablet Take 500 mg by mouth every 6 (six) hours as needed for moderate pain or headache.   albuterol (VENTOLIN HFA) 108 (90 Base) MCG/ACT inhaler Inhale 1-2 puffs into the lungs every 6 (six) hours as needed for wheezing or shortness of breath.   aspirin EC 81 MG tablet Take 81 mg by mouth daily. Swallow whole.   atorvastatin (LIPITOR) 20 MG tablet TAKE 1 TABLET(20 MG) BY MOUTH DAILY   azelastine (ASTELIN) 0.1 % nasal spray Place 1 spray into both nostrils 2 (two) times daily.   denosumab (PROLIA) 60 MG/ML SOSY injection Inject 60 mg into the skin every 6 (six) months.   FIBER PO Take by mouth daily. Either Benefiber or Metamucil   fluticasone (FLONASE) 50 MCG/ACT nasal spray Place 1 spray into both nostrils daily.   gabapentin (NEURONTIN) 300 MG capsule TAKE 1 CAPSULE(300 MG) BY MOUTH THREE TIMES DAILY (Patient taking differently: Take  300 mg by mouth daily.)   levocetirizine (XYZAL) 5 MG tablet Take 5 mg by mouth every evening.    montelukast (SINGULAIR) 10 MG tablet Take 10 mg by mouth at bedtime.   spironolactone (ALDACTONE) 25 MG tablet Take 1 tablet (25 mg total) by mouth daily.   UNABLE TO FIND Take by mouth daily. Med Name: ViActiv   valACYclovir (VALTREX) 500 MG tablet Take 1 tablet po bid x 5 days as needed for outbreak   valsartan (DIOVAN) 160 MG tablet Take 1 tablet (160 mg total) by mouth daily. Please keep upcoming appt. With Dr. Radford Pax in order to receive future refills. Thank You.   Current Facility-Administered Medications for the 09/08/21 encounter (Office Visit) with Sueanne Margarita, MD   Medication   gi cocktail (Maalox,Lidocaine,Donnatal)    Allergies:   Patient has no known allergies.   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    Employer: [employer]  Tobacco Use   Smoking status: Former    Packs/day: 1.00    Years: 14.00    Total pack years: 14.00    Types: Cigarettes    Quit date: 07/11/1982    Years since quitting: 39.1   Smokeless tobacco: Never  Vaping Use   Vaping Use: Never used  Substance and Sexual Activity   Alcohol use: Yes    Alcohol/week: 0.0 standard drinks of alcohol    Comment: rare   Drug use: No   Sexual activity: Yes    Partners: Male    Birth control/protection: Post-menopausal    Comment: 1st intercourse 66 yo-Fewer than 5 partners  Other Topics Concern   Not on file  Social History Narrative   Exercise-- jazzersize  3x a week    Social Determinants of Health   Financial Resource Strain: Low Risk  (12/24/2020)   Overall Financial Resource Strain (CARDIA)    Difficulty of Paying Living Expenses: Not hard at all  Food Insecurity: No Food Insecurity (12/24/2020)   Hunger Vital Sign    Worried About Running Out of Food in the Last Year: Never true    [place] in the Last Year: Never true  Transportation Needs: No Transportation Needs (12/24/2020)   PRAPARE - Hydrologist (Medical): No    Lack of Transportation (Non-Medical): No  Physical Activity: Sufficiently Active (12/24/2020)   Exercise Vital Sign    Days of Exercise per Week: 6 days    Minutes of Exercise per Session: 40 min  Stress: No Stress Concern Present (12/24/2020)   [place]    Feeling of Stress : Not at all  Social Connections:  Moderately Isolated (12/24/2020)   Social Connection and Isolation Panel [NHANES]    Frequency of Communication with Friends and Family: More than three times a week    Frequency of Social Gatherings with Friends and Family: More than three times a week    Attends Religious Services: Never    Marine scientist or Organizations: No    Attends Archivist Meetings: Never    Marital Status: Married     Family History:  The patient's family history includes Cancer (age of onset: 29) in  her maternal grandmother; Diabetes in her maternal grandmother; Heart disease in her father; Hypertension in her father and mother; Stroke in her mother.   ROS:   Please see the history of present illness.    ROS All other systems reviewed and are negative.      No data to display             PHYSICAL EXAM:   VS:  BP 120/72 (BP Location: Right Arm, Patient Position: Sitting, Cuff Size: Normal)   Pulse 72   Ht '5\' 3"'$  (1.6 m)   Wt 122 lb 9.6 oz (55.6 kg)   SpO2 99%   BMI 21.72 kg/m     GEN: Well nourished, well developed in no acute distress HEENT: Normal NECK: No JVD; No carotid bruits LYMPHATICS: No lymphadenopathy CARDIAC:RRR, no murmurs, rubs, gallops RESPIRATORY:  Clear to auscultation without rales, wheezing or rhonchi  ABDOMEN: Soft, non-tender, non-distended MUSCULOSKELETAL:  No edema; No deformity  SKIN: Warm and dry NEUROLOGIC:  Alert and oriented x 3 PSYCHIATRIC:  Normal affect    Wt Readings from Last 3 Encounters:  09/08/21 122 lb 9.6 oz (55.6 kg)  06/19/21 123 lb 9.6 oz (56.1 kg)  05/07/21 120 lb 6.4 oz (54.6 kg)      Studies/Labs Reviewed:   EKG:  EKG is not ordered today  Recent Labs: 01/24/2021: Hemoglobin 12.4; Platelets 168 05/07/2021: ALT 17; BUN 10; Creatinine, Ser 0.61; Potassium 4.0; Sodium 136   Lipid Panel    Component Value Date/Time   CHOL 140 05/07/2021 1106   CHOL 173 09/12/2019 1029   TRIG 78.0 05/07/2021 1106   HDL 72.70 05/07/2021  1106   HDL 75 09/12/2019 1029   CHOLHDL 2 05/07/2021 1106   VLDL 15.6 05/07/2021 1106   LDLCALC 51 05/07/2021 1106   LDLCALC 62 11/05/2019 1353   LDLDIRECT 111.7 05/07/2010 1333    Additional studies/ records that were reviewed today include:  Coronary Ca score and Lexi myoview    ASSESSMENT:    1. [place] coronary artery calcium score less than 100   2. Primary hypertension   3. Hyperlipidemia LDL goal <70   4. Aortic atherosclerosis (HCC)      PLAN:  In order of problems listed above:  1.  Coronary artery calcifications -noted on HRCT for workup of cough -coronary Ca score was 38 and was 69th% for age and sex matched controls in 08/2019. -her CRFs include HTN, postmenopausal, remote tobacco use and fm hx of CAD in her father -Leane Call 07/2019 showed no ischemia -She had 1 episode of CP in December that she thinks was related to stress of Christmas.  She has not had any further episodes and is doing Jazzercise without any problems.  -Continue prescription drug management with ASA '81mg'$  daily and atorvastatin 20 mg daily with as needed refills -I encouraged her to let me know if she develops any CP, SOB or decreased exercise tolerance  2.  HTN -BP is adequate controlled on exam today -Continue prescription drug management with spironolactone 25 mg daily and valsartan 160 mg daily with as needed refills -I have personally reviewed and interpreted outside labs performed by patient's PCP which showed serum creatinine 0.61 potassium 4 on 05/07/2021  3.  HLD -LDL goal < 70 -I have personally reviewed and interpreted outside labs performed by patient's PCP which showed LDL 51, HDL 72, triglycerides 78 and ALT 17 on 05/07/2021 -Continue prescription drug management atorvastatin 20 mg daily with as needed refills  4.  Aortic Atherosclerosis -continue statin   Medication Adjustments/Labs and Tests Ordered: Current medicines are reviewed at length with the patient today.   Concerns regarding medicines are outlined above.  Medication changes, Labs and Tests ordered today are listed in the Patient Instructions below.  There are no Patient Instructions on file for this visit.   Signed, Fransico Him, MD  09/08/2021 10:58 AM    [address], [place], [place]  [address] Phone: [phone]; Fax: [phone]

## 2021-09-08 NOTE — Addendum Note (Signed)
Addended by: Antonieta Iba on: 09/08/2021 11:03 AM   Modules accepted: Orders

## 2021-09-08 NOTE — Patient Instructions (Signed)

## 2021-09-09 ENCOUNTER — Other Ambulatory Visit: Payer: Self-pay | Admitting: Internal Medicine

## 2021-10-06 DIAGNOSIS — M65331 Trigger finger, right middle finger: Secondary | ICD-10-CM | POA: Diagnosis not present

## 2021-10-09 ENCOUNTER — Other Ambulatory Visit: Payer: Self-pay | Admitting: Cardiology

## 2021-10-09 DIAGNOSIS — I10 Essential (primary) hypertension: Secondary | ICD-10-CM

## 2021-10-25 ENCOUNTER — Telehealth: Payer: Self-pay | Admitting: Internal Medicine

## 2021-10-25 NOTE — Telephone Encounter (Signed)
[name] [birth year] - saw me in April 2023 with instruction for CT chest (done in July 2023 and benign) and also to come for followup to see or app to reviwe cough and gabapentin. I do not see followup. Please make one with me or app first avail fine. Thanks,. MR

## 2021-10-29 ENCOUNTER — Encounter: Payer: Self-pay | Admitting: Internal Medicine

## 2021-10-29 ENCOUNTER — Ambulatory Visit: Payer: Medicare PPO | Admitting: Internal Medicine

## 2021-10-29 VITALS — BP 128/70 | HR 60 | Temp 98.0°F | Ht 63.0 in | Wt 125.0 lb

## 2021-10-29 DIAGNOSIS — R918 Other nonspecific abnormal finding of lung field: Secondary | ICD-10-CM | POA: Diagnosis not present

## 2021-10-29 DIAGNOSIS — J387 Other diseases of larynx: Secondary | ICD-10-CM

## 2021-10-29 DIAGNOSIS — R053 Chronic cough: Secondary | ICD-10-CM | POA: Diagnosis not present

## 2021-10-29 NOTE — Progress Notes (Signed)
OV 06/25/2019  Subjective:  Patient ID: [name], female , DOB: 1956-01-05 , age 66 y.o. , MRN: [id number] , ADDRESS: [address] [place] [facility] [address]   06/25/2019 -   Chief Complaint  Patient presents with   Consult    Pt is here due to having a persistent dry cough for years. Pt also is being treated for a pneumothorax. States her cough could be bad at any time of the day.     HPI [name] 66 y.o. -has been referred by Dr. Donneta [name] allergist for evaluation of chronic cough.  Patient tells me that she has had insidious onset of chronic cough since the 1990s.  Since then it is persistent and is at the same level.  Cough is described as moderate.  Mostly dry except when she has a flareup with an infection or bronchitis.  In 2005 she had a spontaneous right pneumothorax after respiratory flareup.  This was treated initially by tube thoracostomy and then by pleurodesis by Dr. Caffie [name] according to the patient's history.  Since then she has not had a recurrence of pneumothorax but does continue to have chronic cough.  The cough is definitely made worse by talking a lot or by inhaling fumes such as perfume or any smoke exposure.  She says the cough during this episodes can be so severe that she almost has a whooping sound to it.  This not necessarily any associated shortness of breath.  The cough is mostly dry.  The cough does not wake her up at night.  Severity index is listed below.  Talking does make the cough worse.  Cough relevant history  -Hypertension: She is not on ACE inhibitor but is on ARB valsartan.  This is not associated with cough  -Allergy history: She did have onset of spring allergies in 1990s.  During this time allergy testing showed positive allergies to a few things including mold and grass.  She says repeat allergy testing some years ago by Dr. Donneta [name] revealed no allergies.  She is not on allergy shots but she is on antihistamines and nasal steroids.  -Sinus  history: She is not had a CT scan of the sinus.  She does not have a significant postnasal drip but she is also on antihistamines.  She has never been evaluated by ENT.  Acid reflux history: She denies any strong acid reflux.  She states in 20 years she is probably taken a few bottles of Tums in total.  She is never seen GI.  However a GI referral is under consideration by Dr. Donneta [name]  -Chronic refractory cough: She does clear her throat.  - Verbal output hx: she is considered a chatty person by her family. Job was a Pharmacologist at [employer].  During this time she had to talk a lot.  -Imaging: Chest x-ray 2020 personally visualized and is clear.    CXR 01/23/2019  IMPRESSION: Stable postoperative chest with mild pleural thickening bilaterally. No evidence of recurrent pneumothorax or acute cardiopulmonary process.     Electronically Signed   By: [name] M.D.   On: 01/23/2019 14:44   ROS - per HPI  Results for Melissa, [name] (MRN [id number]) as of 06/25/2019 09:38  Ref. Range 08/20/2013 09:35 02/21/2014 10:40 08/23/2014 10:30 09/23/2015 16:35 07/29/2016 16:46 10/18/2016 10:06 10/21/2017 10:05 11/03/2018 13:56  Eosinophils Absolute Latest Ref Range: 0.0 - 0.7 K/uL 0.2 0.1 0.1 0.1 0.1 0.1 0.1 0.1   Results for [name],  [name] (MRN [id number]) as of 06/25/2019 09:38  Ref. Range 03/22/2017 13:54  Influenza A, POC Latest Ref Range: Negative  Positive (A)  Influenza B, POC Latest Ref Range: Negative  Negative      07/04/2019 ov - [name] Patient presents today for 10-day follow-up to review CT imaging. She has had a chronic cough for a long times. She takes montelukast and Xyzal. Uses maintenance inahler Advair hfa 115-21 twice daily and prn albuterol hfa prior to exercise. She states that nothing helps her cough. Triggers include strong scents, tart wine and spicy food. She has post nasal drip symptoms. She gets out of breath with speaking. She has no issues with 45 min  exercise class. No significant shortness of breath. No obvious reflux symptoms. She has a known hypodensity liver stable 2014. She does have some epigastric pain. She has an upcoming apt with GI but may rescheduled until after bronchscopy.   Imaging:  CT showed scattered mild cylindrical bronchiectasis mid lungs with associated mild patchy tree-in-bud opacities and mild regions of subpleural scarring.  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  Few scattered solid pulmonary nodules in both lungs largest measuring 6 mm right lower lobe. 3. Two-vessel coronary atherosclerosis. 4. Aortic Atherosclerosis (ICD10-I70.0) and Emphysema   06/30/19 CT maxillofacial - Negative for sinusitis    OV 08/20/2019  Subjective:  Patient ID: [name], female , DOB: September 16, 1955 , age 66 y.o. , MRN: [id number] , ADDRESS: [address] [place] [facility] [address]   08/20/2019 -   Chief Complaint  Patient presents with   Follow-up    still has a dry cough     HPI [name] 66 y.o. -video visit for chronic cough.  This is still follow-up with bronchoscopy results.  She had bronchoscopy mid June 2021.  She had normal airway exam.  Right middle lobe lavage shows neutrophilia.  She is growing Candida albicans.  She is not immunocompromise per her history.  On this video visit she is joined by her husband.  Husband and she report that she coughs early in the morning.  She has seen Dr. Benson Norway of GI who is advised expectant approach.  Certainly he will offer more testing according to history if her symptoms persist.  Medication review shows she is on Advair MDI and also she is taking fish oil.  She says she been taking fish oil for many years.  She has never been on gabapentin but is willing to try this in the future.  She is aware of the concept of neurogenic cough.    Dr Lorenza Cambridge Reflux Symptom Index (> 13-15 suggestive of LPR cough)  06/25/2019   Hoarseness of problem with voice 3   Clearing  Of Throat 1  Excess throat mucus or feeling of post nasal drip 1  Difficulty swallowing food, liquid or tablets 0  Cough after eating or lying down 2  Breathing difficulties or choking episodes 3  Troublesome or annoying cough 5  Sensation of something sticking in throat or lump in throat 2  Heartburn, chest pain, indigestion, or stomach acid coming up 1  TOTAL 17     Results for [name], [name] (MRN [id number]) as of 08/20/2019 09:24  Ref. Range 08/06/2019 07:28  Monocyte-Macrophage-Serous Fluid Latest Ref Range: 50 - 90 % 28 (L)  Other Cells, Fluid Latest Units: % CORRELATE WITH CYTOLOGY.  Fluid Type-FCT Unknown Bronch Lavag  Color, Fluid Latest Ref Range: YELLOW  COLORLESS (A)  Total Nucleated  Cell Count, Fluid Latest Ref Range: 0 - 1,000 cu mm 325  Lymphs, Fluid Latest Units: % 5  Appearance, Fluid Latest Ref Range: CLEAR  HAZY (A)  Neutrophil Count, Fluid Latest Ref Range: 0 - 25 % 67 (H)    Component 2 wk ago  Fungal result 1 Candida albicans Abnormal    AFB Specimen Processing Concentration   Acid Fast Smear Negative     OV 10/23/2019  Subjective:  Patient ID: [name], female , DOB: 11/14/1955 , age 89 y.o. , MRN: [id number] , ADDRESS: [address] [place] [address]   10/23/2019 -   Chief Complaint  Patient presents with   Follow-up    Chronic cough     HPI [name] 66 y.o. -returns for follow-up of her chronic cough.  After her last visit bronchoalveolar lavage cultures continue to be negative other than Candida as above.  She thinks the Candida might have been due to the different steroid inhaler she is taking in the past.  Most recently she has been on Advair for 6 months.  She notices no difference because of it.  She continues to have a cough with clearing of the throat.  She states ice helps continue to soothe her cough.  She says it is random.  She is clearing her throat.  Stopping fish oil has not helped her.  Taking 1 week of  Diflucan has not helped her.  She wants to try other interventions to get rid of her symptoms.  However on the symptom questionnaire she is some better which she later admitted to that she was some better but not fully better.     OV 11/13/2020  Subjective:  Patient ID: [name], female , DOB: 04/24/1955 , age 80 y.o. , MRN: [id number] , ADDRESS: [address] [place] [address] PCP Carollee Herter, Alferd Apa, DO Patient Care Team: Carollee Herter, Alferd Apa, DO as PCP - General Sueanne Margarita, MD as PCP - Cardiology (Cardiology) Mosetta Anis, MD as Referring Physician (Allergy) Brand Males, MD as Consulting Physician (Pulmonary Disease) Jari Pigg, MD as Consulting Physician (Dermatology)  This Provider for this visit: Treatment Team:  Attending Provider: Brand Males, MD    11/13/2020 -   Chief Complaint  Patient presents with   Follow-up    Pt states her cough has been doing better with being on gabapentin.   Follow-up chronic cough Follow-up lung nodule multiple with largest 8 mm and right lower lobe May 2022 [stable through Melissa 2021] Former history of smoking  HPI [name] 66 y.o. -returns for follow-up.  At this point she had called over a month ago saying cough is back.  We prescribed her gabapentin and the cough is almost resolved.  She feels gabapentin is helping her.  She seen Dr. Donneta [name] and apparently he has ruled out allergic rhinitis but she continues on Xyzal and Singulair.  She is not on any inhalers.  Last visit I told her to stop her fish oil which she has.  This visit I noticed that she is on Fosamax.  This can cause acid reflux.  We will give her instructions to talk to primary care and stop Fosamax.  Overall she is feeling well.  Her most recent CT chest was in May 2022 with multiple nodules with the largest being 8 mm right lower lobe.  That was stable since Melissa 2021.  Radiologist recommending CT scan in 18 months from May 2022  which would be end of 2023 but given history of smoking I will probably get it sooner.       CLINICAL DATA:  Bronchiectasis with lung nodules.   EXAM: CT CHEST WITHOUT CONTRAST   TECHNIQUE: Multidetector CT imaging of the chest was performed following the standard protocol without IV contrast.   COMPARISON:  12/24/2019   FINDINGS: Cardiovascular: The heart size is normal. No substantial pericardial effusion. Atherosclerotic calcification is noted in the wall of the thoracic aorta.   Mediastinum/Nodes: No mediastinal lymphadenopathy. No evidence for gross hilar lymphadenopathy although assessment is limited by the lack of intravenous contrast on today's study. The esophagus has normal imaging features. There is no axillary lymphadenopathy.   Lungs/Pleura: Biapical pleuroparenchymal scarring evident with stable appearance of the right apical staple line. 3 mm nodule in the medial right apex measured previously is stable when remeasured in the same dimension today. Previously measured 3 mm anterior right upper lobe pulmonary nodule has resolved in the interval. 8 x 4 mm right lower lobe nodule on 83/8 was 7 x 5 mm previously, unchanged. Several additional scattered tiny pulmonary nodules are stable. No new suspicious pulmonary nodule or mass. No focal airspace consolidation. No pleural effusion.   Upper Abdomen: Unremarkable.   Musculoskeletal: No worrisome lytic or sclerotic osseous abnormality.   IMPRESSION: 1. Interval resolution of the 3 mm anterior right upper lobe pulmonary nodule. Other scattered tiny bilateral pulmonary nodules are stable in the interval including the 8 x 4 mm right lower lobe nodule. CT at 18-24 months (from today's scan) is considered optional for low-risk patients, but is recommended for high-risk patients. This recommendation follows the consensus statement: Guidelines for Management of Incidental Pulmonary Nodules Detected on CT Images:  From the Fleischner Society 2017; Radiology 2017; 284:228-243. 2. No new suspicious pulmonary nodule or mass. 3. Aortic Atherosclerosis (ICD10-I70.0).       OV 02/05/2021  Subjective:  Patient ID: [name], female , DOB: 08-11-1955 , age 9 y.o. , MRN: [id number] , ADDRESS: [address] [place] [address] PCP Carollee Herter, Alferd Apa, DO Patient Care Team: Carollee Herter, Alferd Apa, DO as PCP - General Sueanne Margarita, MD as PCP - Cardiology (Cardiology) Mosetta Anis, MD as Referring Physician (Allergy) Brand Males, MD as Consulting Physician (Pulmonary Disease) Jari Pigg, MD as Consulting Physician (Dermatology)  This Provider for this visit: Treatment Team:  Attending Provider: Brand Males, MD    02/05/2021 -   Chief Complaint  Patient presents with   Follow-up    Pt states she has been doing okay since last visit and denies any complaints.    Follow-up chronic cough -Repeat gabapentin since August 2022 -Stop Fosamax October 2022 Follow-up lung nodule multiple with largest 8 mm and right lower lobe May 2022 [stable through Melissa 2021] Former history of smoking  HPI [name] 66 y.o. -returns for follow-up.  After her last visit she stopped her Fosamax.  She believes the cough is stable/well-controlled.  She takes the gabapentin 3 mg 3 times daily.  She still states that sometimes unexpectedly early in the morning she will cough.  Dust and fumes can irritate her.  Restless cough score is 7 which is much better than 2021 but stable/slightly worse than September 2022.  Details below.  Nevertheless she is willing to cut down on the gabapentin and see what happens.  Last time she stopped gabapentin and within 5 to 6 weeks the cough came back.  CT Chest data  No results found.    OV 06/19/2021  Subjective:  Patient ID: [name], female , DOB: [birth year] , age 70 y.o. , MRN: [id number] , ADDRESS: [address] [place]  [id number] PCP Carollee Herter, Alferd Apa, DO Patient Care Team: Carollee Herter, Alferd Apa, DO as PCP - General Sueanne Margarita, MD as PCP - Cardiology (Cardiology) Mosetta Anis, MD as Referring Physician (Allergy) Brand Males, MD as Consulting Physician (Pulmonary Disease) Jari Pigg, MD as Consulting Physician (Dermatology)  This Provider for this visit: Treatment Team:  Attending Provider: Brand Males, MD   Follow-up chronic cough -Repeat gabapentin since August 2022 -Stop Fosamax October 2022 Follow-up lung nodule multiple with largest 8 mm and right lower lobe May 2022 [stable through Melissa 2021]  - did not do April 2023 CT  Covid sprng 2023 - april  Former history of smoking  06/19/2021 -   Chief Complaint  Patient presents with   Follow-up    Pt states she is doing much better since her last visit.     HPI [name] 66 y.o. -cough followup.Recent covid +. Rx paxlovid. Cough better but still +. Uses mask while doing dusting. On bid gabapenting and tolerating. STill with some nasal congestion. Supposed to CT for nodules but did not do RSI cough score below:    Does JAzz Exercise with [employer] 10/29/2021  Subjective:  Patient ID: [name], female , DOB: [birth year] , age 24 y.o. , MRN: [id number] , ADDRESS: [place] [address] PCP Carollee Herter, Alferd Apa, DO Patient Care Team: Carollee Herter, Alferd Apa, DO as PCP - General Sueanne Margarita, MD as PCP - Cardiology (Cardiology) Mosetta Anis, MD as Referring Physician (Allergy) Brand Males, MD as Consulting Physician (Pulmonary Disease) Jari Pigg, MD as Consulting Physician (Dermatology)  This Provider for this visit: Treatment Team:  Attending Provider: Brand Males, MD   Follow-up chronic cough -Repeat gabapentin since August 2022 -Stop Fosamax October 2022 Follow-up lung nodule multiple with largest 8 mm and right lower lobe May 2022 [stable through Melissa  2021]  - dJuly 2023 ->stable -> no futher CT  Covid sprng 2023 - april  Former history of smoking  10/29/2021 -   Chief Complaint  Patient presents with   Follow-up    Pt states she has been doing okay since last visit. States she still has a cough that she believes is worse in the evenings.     HPI [name] 66 y.o. -returns for follow-up.Still hs some cough . IS on gabapentin lower dose 324m once daily at night. Summer has been tough due to humidity. She is willing to taper the gabapentin to off an dsee. HAd CT - lung nodules are stable. No further followup needed     Dr KLorenza CambridgeReflux Symptom Index (> 13-15 suggestive of LPR cough)  06/25/2019  10/23/2019  11/13/2020 gabepentin 02/05/2021  06/19/2021   Hoarseness of problem with voice 3 2 0 0 1  Clearing  Of Throat 1 2 0 2 1  Excess throat mucus or feeling of post nasal drip 1 1 0 0 0  Difficulty swallowing food, liquid or tablets 0 0 0 0 0  Cough after eating or lying down 2 0 0 0 1  Breathing difficulties or choking episodes 3 2 0 0 0  Troublesome or annoying cough _0 Sensation of something sticking in throat or lump  in throat 2 0 0 1 1  Heartburn, chest pain, indigestion, or stomach acid coming up 1 0 0 1 0  TOTAL _0 CT chewt July 2023  Narrative & Impression  CLINICAL DATA:  Multiple lung nodules.  Follow-up.   EXAM: CT CHEST WITHOUT CONTRAST   TECHNIQUE: Multidetector CT imaging of the chest was performed following the standard protocol without IV contrast.   RADIATION DOSE REDUCTION: This exam was performed according to the departmental dose-optimization program which includes automated exposure control, adjustment of the mA and/or kV according to patient size and/or use of iterative reconstruction technique.   COMPARISON:  07/01/2020   FINDINGS: Cardiovascular: Normal heart size. Aortic atherosclerosis and coronary artery calcifications. No pericardial effusion identified.    Mediastinum/Nodes: No enlarged mediastinal or axillary lymph nodes. Thyroid gland, trachea, and esophagus demonstrate no significant findings.   Lungs/Pleura: Centrilobular and paraseptal emphysema. N a o pleural effusion or airspace consolidation. Scattered areas of mild cylindrical bronchiectasis identified bilaterally. Medial right upper lobe lung nodule is unchanged measuring 4 mm, image 26/5. Left upper lobe lung nodule measures 3 mm and is stable in the interval, image 29/5. Posteromedial right lower lobe lung nodule is stable measuring 8 mm, image 79/5. This is been unchanged since 12/24/2019 and 06/29/2019. Similar appearance evaluate pickle pleuroparenchymal scarring with right apical suture chain. A few scattered areas of mucoid impaction noted within dilated distal bronchials. No new lung nodules identified.   Upper Abdomen: No acute findings. Posterior right lobe of liver hypodense structure measures 4.6 x 2.7 cm. This is unchanged compared with the previous exam. MR from 12/26/2012 categorized as this is a benign liver hemangioma.   Musculoskeletal: No chest wall mass or suspicious bone lesions identified.   IMPRESSION: 1. Stable appearance of small bilateral pulmonary nodules these are compatible with a benign process. No follow-up imaging recommended. 2. Stable right lobe of liver hemangioma. 3. Aortic Atherosclerosis (ICD10-I70.0).     Electronically Signed   By: Kerby Moors M.D.   On: 09/03/2021 14:13     PFT     Latest Ref Rng & Units 08/02/2019    3:45 PM  PFT Results  FVC-Pre L 2.76   FVC-Predicted Pre % 91   FVC-Post L 2.87   FVC-Predicted Post % 95   Pre FEV1/FVC % % 79   Post FEV1/FCV % % 79   FEV1-Pre L 2.19   FEV1-Predicted Pre % 95   FEV1-Post L 2.25   DLCO uncorrected ml/min/mmHg 19.12   DLCO UNC% % 100   DLCO corrected ml/min/mmHg 19.12   DLCO COR %Predicted % 100   DLVA Predicted % 102   TLC L 4.87   TLC % Predicted % 99   RV  % Predicted % 102        has a past medical history of Allergy, Cataract, Chronic cough (01/24/2019), Costochondritis (01/24/2019), Epigastric pain (07/29/2016), FIBROIDS, UTERUS (01/18/2007), H/O Bell's palsy (05/2010), HYPERTENSION (03/24/2007), MENORRHAGIA, PERIMENOPAUSAL (01/13/2007), MOLE (05/07/2010), Osteopenia (08/23/2014), RUQ pain (07/29/2016), Spontaneous pneumothorax (2005), and Spontaneous tension pneumothorax (03/24/2007).   reports that she quit smoking about 39 years ago. Her smoking use included cigarettes. She has a 14.00 pack-year smoking history. She has never used smokeless tobacco.  Past Surgical History:  Procedure Laterality Date   BRONCHIAL WASHINGS  08/06/2019   Procedure: BRONCHIAL lavage;  Surgeon: Brand Males, MD;  Location: [facility];  Service: Endoscopy;;   CESAREAN SECTION     CHEST TUBE INSERTION  2005   spontaneus Pneumothorax   COLONOSCOPY     SPIROMETRY  01/20/2015   Dr. Donneta [name]   VIDEO ASSISTED THORACOSCOPY  2005   VIDEO BRONCHOSCOPY N/A 08/06/2019   Procedure: VIDEO BRONCHOSCOPY WITHOUT FLUORO;  Surgeon: Brand Males, MD;  Location: [facility];  Service: Endoscopy;  Laterality: N/A;   WISDOM TOOTH EXTRACTION      No Known Allergies  Immunization History  Administered Date(s) Administered   DTaP 02/03/2019   Fluad Quad(high Dose 65+) 11/03/2018, 11/06/2020   Influenza Whole 11/30/2006, 12/13/2007, 01/01/2009, 12/31/2009   Influenza, High Dose Seasonal PF 04/19/2016, 04/12/2017, 03/14/2018, 04/30/2019   Influenza,inj,Quad PF,6+ Mos 11/27/2017   Influenza-Unspecified 10/24/2013, 11/28/2016, 11/23/2019   PFIZER(Purple Top)SARS-COV-2 Vaccination 03/31/2019, 04/25/2019, 11/23/2019, 09/01/2020, 12/03/2020   Pneumococcal Conjugate-13 11/03/2018   Pneumococcal Polysaccharide-23 04/12/2017, 03/14/2018, 04/30/2019, 11/05/2019, 11/04/2020   Td 08/28/2002, 04/18/2008   Tdap 01/24/2019   Zoster Recombinat (Shingrix) 11/28/2016, 02/04/2017    Zoster, Live 08/20/2013    Family History  Problem Relation Age of Onset   Hypertension Mother    Stroke Mother    Heart disease Father    Hypertension Father    Cancer Maternal Grandmother 25       breast   Diabetes Maternal Grandmother    Colon cancer Neg Hx    Esophageal cancer Neg Hx    Rectal cancer Neg Hx    Stomach cancer Neg Hx      Current Outpatient Medications:    acetaminophen (TYLENOL) 500 MG tablet, Take 500 mg by mouth every 6 (six) hours as needed for moderate pain or headache., Disp: , Rfl:    albuterol (VENTOLIN HFA) 108 (90 Base) MCG/ACT inhaler, Inhale 1-2 puffs into the lungs every 6 (six) hours as needed for wheezing or shortness of breath., Disp: , Rfl:    aspirin EC 81 MG tablet, Take 81 mg by mouth daily. Swallow whole., Disp: , Rfl:    atorvastatin (LIPITOR) 20 MG tablet, TAKE 1 TABLET(20 MG) BY MOUTH DAILY, Disp: 90 tablet, Rfl: 3   azelastine (ASTELIN) 0.1 % nasal spray, Place 1 spray into both nostrils 2 (two) times daily., Disp: , Rfl:    denosumab (PROLIA) 60 MG/ML SOSY injection, Inject 60 mg into the skin every 6 (six) months., Disp: , Rfl:    FIBER PO, Take by mouth daily. Either Benefiber or Metamucil, Disp: , Rfl:    fluticasone (FLONASE) 50 MCG/ACT nasal spray, Place 1 spray into both nostrils daily., Disp: , Rfl:    gabapentin (NEURONTIN) 300 MG capsule, TAKE 1 CAPSULE(300 MG) BY MOUTH THREE TIMES DAILY (Patient taking differently: Take 300 mg by mouth daily.), Disp: 90 capsule, Rfl: 3   levocetirizine (XYZAL) 5 MG tablet, Take 5 mg by mouth every evening. , Disp: , Rfl:    montelukast (SINGULAIR) 10 MG tablet, Take 10 mg by mouth at bedtime., Disp: , Rfl:    spironolactone (ALDACTONE) 25 MG tablet, TAKE 1 TABLET(25 MG) BY MOUTH DAILY, Disp: 90 tablet, Rfl: 3   UNABLE TO FIND, Take by mouth daily. Med Name: ViActiv, Disp: , Rfl:    valACYclovir (VALTREX) 500 MG tablet, Take 1 tablet po bid x 5 days as needed for outbreak, Disp: 30 tablet, Rfl:  1   valsartan (DIOVAN) 160 MG tablet, Take 1 tablet (160 mg total) by mouth daily. Please keep upcoming appt. With Dr. Radford Pax in order to receive future refills. Thank You., Disp: 90 tablet, Rfl: 3   nitroGLYCERIN (NITROSTAT) 0.4 MG SL tablet, Place 1 tablet (  0.4 mg total) under the tongue every 5 (five) minutes as needed for chest pain., Disp: 90 tablet, Rfl: 3  Current Facility-Administered Medications:    gi cocktail (Maalox,Lidocaine,Donnatal), 30 mL, Oral, Once, [employer] R, DO      Objective:   Vitals:   10/29/21 1418  BP: 128/70  Pulse: 60  Temp: 98 F (36.7 C)  TempSrc: Oral  SpO2: 100%  Weight: 125 lb (56.7 kg)  Height: _0  (1.6 m)    Estimated body mass index is 22.14 kg/m as calculated from the following:   Height as of this encounter: _1  (1.6 m).   Weight as of this encounter: 125 lb (56.7 kg).  _2 @  Filed Weights   10/29/21 1418  Weight: 125 lb (56.7 kg)     Physical Exam    General: No distress. Looks well Neuro: Alert and Oriented x 3. GCS 15. Speech normal Psych: Pleasant Resp:  Barrel Chest - no.  Wheeze - no, Crackles - no, No overt respiratory distress CVS: Normal heart sounds. Murmurs - no Ext: Stigmata of Connective Tissue Disease - no HEENT: Normal upper airway. PEERL +. No post nasal drip        Assessment:     No diagnosis found.     Plan:     Patient Instructions   #cough Your cough neuropathy . Cough significantly improved after restart of gabapentin full dose in August 2022 and stopping fosfamax OCt 2022 and on gabapentin 347m twice daily since dec 2022 -> cough not resolved but mildly persistent as of Sept 2023 on gabapentin 307monce at night  #Lung Nodules  - Lung nodules stable Melissa 2021 through May 2022 [largest nodule 8 mm right lower lobe] - CT scan chest July  2023 stble nodules - No further CT indicated   Plan -Continue gabapentin 30028mnce daily at night through end of Oct 2023  -> in Nov 2023 take gabapentin 300m63mnday, Wed, Friday each week -> In dec 2023 take only on Monday and Friday  2023 -> In Jan 2024 -> take on Monday only -> and then stop  - Continue other medications  -Avoid getting rhinitis or respiratory infection which can then make your cough worse  - RSV, flu, covid shots in fall  Followup FEb 2024/March 2024  month with nurse practitioner or Dr. RamaChase CallerRSI cough score at follow-up     SIGNATURE    Dr. MuraBrand MalesD., F.C.C.P,  Pulmonary and Critical Care Medicine Staff Physician, [facility]  [facility]  Pulmonary [place], [id number]: [phone] no answer or between  15:00h - 7:00h: call [phone] Telephone: [phone]  2:45 PM 10/29/2021

## 2021-10-29 NOTE — Patient Instructions (Addendum)
#  cough Your cough neuropathy . Cough significantly improved after restart of gabapentin full dose in August 2022 and stopping fosfamax OCt 2022 and on gabapentin '300mg'$  twice daily since dec 2022 -> cough not resolved but mildly persistent as of Sept 2023 on gabapentin '300mg'$  once at night  #Lung Nodules  - Lung nodules stable November 2021 through May 2022 [largest nodule 8 mm right lower lobe] - CT scan chest July  2023 stble nodules - No further CT indicated   Plan -Continue gabapentin '300mg'$  once daily at night through end of Oct 2023 -> in Nov 2023 take gabapentin '300mg'$  Monday, Wed, Friday each week -> In dec 2023 take only on Monday and Friday  2023 -> In Jan 2024 -> take on Monday only -> and then stop  - Continue other medications  -Avoid getting rhinitis or respiratory infection which can then make your cough worse  - RSV, flu, covid shots in fall  Followup FEb 2024/March 2024  month with nurse practitioner or Dr. Chase Caller   -RSI cough score at follow-up

## 2021-11-10 ENCOUNTER — Encounter: Payer: Medicare PPO | Admitting: Family Medicine

## 2021-11-10 ENCOUNTER — Other Ambulatory Visit: Payer: Self-pay | Admitting: Cardiology

## 2021-11-10 DIAGNOSIS — I10 Essential (primary) hypertension: Secondary | ICD-10-CM

## 2021-11-16 ENCOUNTER — Other Ambulatory Visit: Payer: Self-pay | Admitting: Cardiology

## 2021-11-18 DIAGNOSIS — R052 Subacute cough: Secondary | ICD-10-CM | POA: Diagnosis not present

## 2021-11-18 DIAGNOSIS — J300 Vasomotor rhinitis: Secondary | ICD-10-CM | POA: Diagnosis not present

## 2021-11-19 ENCOUNTER — Ambulatory Visit (INDEPENDENT_AMBULATORY_CARE_PROVIDER_SITE_OTHER): Payer: Medicare PPO | Admitting: Family Medicine

## 2021-11-19 ENCOUNTER — Ambulatory Visit: Payer: Medicare PPO | Admitting: Family Medicine

## 2021-11-19 ENCOUNTER — Encounter: Payer: Self-pay | Admitting: Family Medicine

## 2021-11-19 VITALS — BP 126/86 | HR 68 | Temp 97.7°F | Resp 18 | Ht 63.0 in | Wt 124.2 lb

## 2021-11-19 DIAGNOSIS — I10 Essential (primary) hypertension: Secondary | ICD-10-CM

## 2021-11-19 DIAGNOSIS — Z0000 Encounter for general adult medical examination without abnormal findings: Secondary | ICD-10-CM

## 2021-11-19 DIAGNOSIS — I25119 Atherosclerotic heart disease of native coronary artery with unspecified angina pectoris: Secondary | ICD-10-CM

## 2021-11-19 DIAGNOSIS — Z23 Encounter for immunization: Secondary | ICD-10-CM | POA: Diagnosis not present

## 2021-11-19 DIAGNOSIS — E785 Hyperlipidemia, unspecified: Secondary | ICD-10-CM | POA: Diagnosis not present

## 2021-11-19 DIAGNOSIS — M810 Age-related osteoporosis without current pathological fracture: Secondary | ICD-10-CM

## 2021-11-19 LAB — COMPREHENSIVE METABOLIC PANEL
ALT: 21 U/L (ref 0–35)
AST: 15 U/L (ref 0–37)
Albumin: 4.3 g/dL (ref 3.5–5.2)
Alkaline Phosphatase: 66 U/L (ref 39–117)
BUN: 12 mg/dL (ref 6–23)
CO2: 30 mEq/L (ref 19–32)
Calcium: 8.7 mg/dL (ref 8.4–10.5)
Chloride: 102 mEq/L (ref 96–112)
Creatinine, Ser: 0.62 mg/dL (ref 0.40–1.20)
GFR: 91.36 mL/min (ref >60.00)
Glucose, Bld: 92 mg/dL (ref 70–99)
Potassium: 3.7 mEq/L (ref 3.5–5.1)
Sodium: 138 mEq/L (ref 135–145)
Total Bilirubin: 0.6 mg/dL (ref 0.2–1.2)
Total Protein: 6.8 g/dL (ref 6.0–8.3)

## 2021-11-19 LAB — CBC WITH DIFFERENTIAL/PLATELET
Basophils Absolute: 0.1 10*3/uL (ref 0.0–0.1)
Basophils Relative: 1.2 % (ref 0.0–3.0)
Eosinophils Absolute: 0.1 10*3/uL (ref 0.0–0.7)
Eosinophils Relative: 3.3 % (ref 0.0–5.0)
HCT: 37.7 % (ref 36.0–46.0)
Hemoglobin: 13 g/dL (ref 12.0–15.0)
Lymphocytes Relative: 34.4 % (ref 12.0–46.0)
Lymphs Abs: 1.5 10*3/uL (ref 0.7–4.0)
MCHC: 34.6 g/dL (ref 30.0–36.0)
MCV: 88.9 fl (ref 78.0–100.0)
Monocytes Absolute: 0.4 10*3/uL (ref 0.1–1.0)
Monocytes Relative: 8.5 % (ref 3.0–12.0)
Neutro Abs: 2.3 10*3/uL (ref 1.4–7.7)
Neutrophils Relative %: 52.6 % (ref 43.0–77.0)
Platelets: 181 10*3/uL (ref 150.0–400.0)
RBC: 4.24 Mil/uL (ref 3.87–5.11)
RDW: 12.5 % (ref 11.5–15.5)
WBC: 4.4 10*3/uL (ref 4.0–10.5)

## 2021-11-19 LAB — LIPID PANEL
Cholesterol: 143 mg/dL (ref 0–200)
HDL: 65.8 mg/dL (ref >39.00)
LDL Cholesterol: 62 mg/dL (ref 0–99)
NonHDL: 76.73
Total CHOL/HDL Ratio: 2
Triglycerides: 75 mg/dL (ref 0.0–149.0)
VLDL: 15 mg/dL (ref 0.0–40.0)

## 2021-11-19 NOTE — Assessment & Plan Note (Signed)
ghm utd Check labs  See avs  

## 2021-11-19 NOTE — Patient Instructions (Signed)
Preventive Care 65 Years and Older, Female Preventive care refers to lifestyle choices and visits with your health care provider that can promote health and wellness. Preventive care visits are also called wellness exams. What can I expect for my preventive care visit? Counseling Your health care provider may ask you questions about your: Medical history, including: Past medical problems. Family medical history. Pregnancy and menstrual history. History of falls. Current health, including: Memory and ability to understand (cognition). Emotional well-being. Home life and relationship well-being. Sexual activity and sexual health. Lifestyle, including: Alcohol, nicotine or tobacco, and drug use. Access to firearms. Diet, exercise, and sleep habits. Work and work environment. Sunscreen use. Safety issues such as seatbelt and bike helmet use. Physical exam Your health care provider will check your: Height and weight. These may be used to calculate your BMI (body mass index). BMI is a measurement that tells if you are at a healthy weight. Waist circumference. This measures the distance around your waistline. This measurement also tells if you are at a healthy weight and may help predict your risk of certain diseases, such as type 2 diabetes and high blood pressure. Heart rate and blood pressure. Body temperature. Skin for abnormal spots. What immunizations do I need?  Vaccines are usually given at various ages, according to a schedule. Your health care provider will recommend vaccines for you based on your age, medical history, and lifestyle or other factors, such as travel or where you work. What tests do I need? Screening Your health care provider may recommend screening tests for certain conditions. This may include: Lipid and cholesterol levels. Hepatitis C test. Hepatitis B test. HIV (human immunodeficiency virus) test. STI (sexually transmitted infection) testing, if you are at  risk. Lung cancer screening. Colorectal cancer screening. Diabetes screening. This is done by checking your blood sugar (glucose) after you have not eaten for a while (fasting). Mammogram. Talk with your health care provider about how often you should have regular mammograms. BRCA-related cancer screening. This may be done if you have a family history of breast, ovarian, tubal, or peritoneal cancers. Bone density scan. This is done to screen for osteoporosis. Talk with your health care provider about your test results, treatment options, and if necessary, the need for more tests. Follow these instructions at home: Eating and drinking  Eat a diet that includes fresh fruits and vegetables, whole grains, lean protein, and low-fat dairy products. Limit your intake of foods with high amounts of sugar, saturated fats, and salt. Take vitamin and mineral supplements as recommended by your health care provider. Do not drink alcohol if your health care provider tells you not to drink. If you drink alcohol: Limit how much you have to 0-1 drink a day. Know how much alcohol is in your drink. In the [place] one drink equals one 12 oz bottle of beer (355 mL), one 5 oz glass of wine (148 mL), or one 1 oz glass of hard liquor (44 mL). Lifestyle Brush your teeth every morning and night with fluoride toothpaste. Floss one time each day. Exercise for at least 30 minutes 5 or more days each week. Do not use any products that contain nicotine or tobacco. These products include cigarettes, chewing tobacco, and vaping devices, such as e-cigarettes. If you need help quitting, ask your health care provider. Do not use drugs. If you are sexually active, practice safe sex. Use a condom or other form of protection in order to prevent STIs. Take aspirin only as told by   your health care provider. Make sure that you understand how much to take and what form to take. Work with your health care provider to find out whether it  is safe and beneficial for you to take aspirin daily. Ask your health care provider if you need to take a cholesterol-lowering medicine (statin). Find healthy ways to manage stress, such as: Meditation, yoga, or listening to music. Journaling. Talking to a trusted person. Spending time with friends and family. Minimize exposure to UV radiation to reduce your risk of skin cancer. Safety Always wear your seat belt while driving or riding in a vehicle. Do not drive: If you have been drinking alcohol. Do not ride with someone who has been drinking. When you are tired or distracted. While texting. If you have been using any mind-altering substances or drugs. Wear a helmet and other protective equipment during sports activities. If you have firearms in your house, make sure you follow all gun safety procedures. What's next? Visit your health care provider once a year for an annual wellness visit. Ask your health care provider how often you should have your eyes and teeth checked. Stay up to date on all vaccines. This information is not intended to replace advice given to you by your health care provider. Make sure you discuss any questions you have with your health care provider. Document Revised: 08/06/2020 Document Reviewed: 08/06/2020 Elsevier Patient Education  2023 Elsevier Inc.  

## 2021-11-19 NOTE — Assessment & Plan Note (Signed)
Encourage heart healthy diet such as MIND or DASH diet, increase exercise, avoid trans fats, simple carbohydrates and processed foods, consider a krill or fish or flaxseed oil cap daily.  °

## 2021-11-19 NOTE — Assessment & Plan Note (Signed)
stable °

## 2021-11-19 NOTE — Progress Notes (Signed)
Subjective:   By signing my name below, I, [name], attest that this documentation has been prepared under the direction and in the presence of Ann Held DO 11/19/2021   Patient ID: [name], female    DOB: 1955-04-08, 66 y.o.   MRN: [id number]  Chief Complaint  Patient presents with   Annual Exam    Pt states fasting     HPI Patient is in today for a comprehensive physical exam  As of today's visit, her blood pressure is normal. She is regularly following up with a cardiologist. She reports that she did not sleep well the night before. BP Readings from Last 3 Encounters:  11/19/21 126/86  10/29/21 128/70  09/08/21 120/72   Pulse Readings from Last 3 Encounters:  11/19/21 68  10/29/21 60  09/08/21 72   She reports that her pulmonologist specialist is weaning her off of her 300 Mg of Gabapentin. She is currently taking one tablet of her medication daily.  She denies having any fever, new muscle pain, joint pain , new moles, congestion, sinus pain, sore throat, chest pain, palpations, cough, SOB ,wheezing,n/v/d constipation, blood in stool, dysuria, frequency, hematuria, at this time  She reports a cataract surgery in both eyes with a lens implant in 2022. She denies of any changes to her family medical history. Colonoscopy last completed on 10/26/2017 Dexa last completed on 07/29/2021 Pap Smear last completed on 08/06/2019 Mammogram last completed on 07/29/2021 She is interested in receiving an influenza vaccine during today's visit. She is UTD on her pneumonia vaccine. She is regularly exercising.  Past Medical History:  Diagnosis Date   Allergy    Cataract    Chronic cough 01/24/2019   Costochondritis 01/24/2019   Epigastric pain 07/29/2016   FIBROIDS, UTERUS 01/18/2007   Qualifier: Diagnosis of  By: Jerold Coombe     H/O Bell's palsy 05/2010   HYPERTENSION 03/24/2007   Qualifier: Diagnosis of  By: Ronnald Ramp CMA, Chemira     MENORRHAGIA,  PERIMENOPAUSAL 01/13/2007   Qualifier: Diagnosis of  By: Jerold Coombe     MOLE 05/07/2010   Qualifier: Diagnosis of  By: Jerold Coombe     Osteopenia 08/23/2014   RUQ pain 07/29/2016   Spontaneous pneumothorax 2005   R, s/p tube, then VATS   Spontaneous tension pneumothorax 03/24/2007   Qualifier: Diagnosis of  By: Jerold Coombe      Past Surgical History:  Procedure Laterality Date   BRONCHIAL WASHINGS  08/06/2019   Procedure: BRONCHIAL lavage;  Surgeon: Brand Males, MD;  Location: [facility];  Service: Endoscopy;;   CATARACT EXTRACTION W/ INTRAOCULAR LENS IMPLANT Bilateral    june 2023   CESAREAN SECTION     CHEST TUBE INSERTION  2005   spontaneus Pneumothorax   COLONOSCOPY     SPIROMETRY  01/20/2015   Dr. Donneta Romberg   VIDEO ASSISTED THORACOSCOPY  2005   VIDEO BRONCHOSCOPY N/A 08/06/2019   Procedure: VIDEO BRONCHOSCOPY WITHOUT FLUORO;  Surgeon: Brand Males, MD;  Location: [facility];  Service: Endoscopy;  Laterality: N/A;   WISDOM TOOTH EXTRACTION      Family History  Problem Relation Age of Onset   Hypertension Mother    Stroke Mother    Heart disease Father    Hypertension Father    Cancer Maternal Grandmother 2       breast   Diabetes Maternal Grandmother    Colon cancer Neg Hx    Esophageal cancer Neg Hx  Rectal cancer Neg Hx    Stomach cancer Neg Hx     Social History   Socioeconomic History   Marital status: Married    Spouse name: Not on file   Number of children: 3   Years of education: Not on file   Highest education level: Not on file  Occupational History   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    Employer: [employer]  Tobacco Use   Smoking status: Former    Packs/day: 1.00    Years: 14.00    Total pack years: 14.00    Types: Cigarettes    Quit date: 07/11/1982    Years since quitting: 39.3   Smokeless tobacco: Never  Vaping Use   Vaping Use: Never used  Substance and Sexual Activity    Alcohol use: Yes    Alcohol/week: 0.0 standard drinks of alcohol    Comment: rare   Drug use: No   Sexual activity: Yes    Partners: Male    Birth control/protection: Post-menopausal    Comment: 1st intercourse 66 yo-Fewer than 5 partners  Other Topics Concern   Not on file  Social History Narrative   Exercise-- jazzersize  3x a week    Social Determinants of Health   Financial Resource Strain: Low Risk  (12/24/2020)   Overall Financial Resource Strain (CARDIA)    Difficulty of Paying Living Expenses: Not hard at all  Food Insecurity: No Food Insecurity (12/24/2020)   Hunger Vital Sign    Worried About Running Out of Food in the Last Year: Never true    [place] in the Last Year: Never true  Transportation Needs: No Transportation Needs (12/24/2020)   PRAPARE - Hydrologist (Medical): No    Lack of Transportation (Non-Medical): No  Physical Activity: Sufficiently Active (12/24/2020)   Exercise Vital Sign    Days of Exercise per Week: 6 days    Minutes of Exercise per Session: 40 min  Stress: No Stress Concern Present (12/24/2020)   [place]    Feeling of Stress : Not at all  Social Connections: Moderately Isolated (12/24/2020)   Social Connection and Isolation Panel [NHANES]    Frequency of Communication with Friends and Family: More than three times a week    Frequency of Social Gatherings with Friends and Family: More than three times a week    Attends Religious Services: Never    Marine scientist or Organizations: No    Attends Archivist Meetings: Never    Marital Status: Married  Human resources officer Violence: Not At Risk (12/24/2020)   Humiliation, Afraid, Rape, and Kick questionnaire    Fear of Current or Ex-Partner: No    Emotionally Abused: No    Physically Abused: No    Sexually Abused: No    Outpatient Medications Prior to Visit  Medication Sig  Dispense Refill   acetaminophen (TYLENOL) 500 MG tablet Take 500 mg by mouth every 6 (six) hours as needed for moderate pain or headache.     albuterol (VENTOLIN HFA) 108 (90 Base) MCG/ACT inhaler Inhale 1-2 puffs into the lungs every 6 (six) hours as needed for wheezing or shortness of breath.     aspirin EC 81 MG tablet Take 81 mg by mouth daily. Swallow whole.     atorvastatin (LIPITOR) 20 MG tablet TAKE 1 TABLET(20 MG) BY MOUTH DAILY 90 tablet 2   azelastine (ASTELIN) 0.1 %  nasal spray Place 1 spray into both nostrils 2 (two) times daily.     denosumab (PROLIA) 60 MG/ML SOSY injection Inject 60 mg into the skin every 6 (six) months.     FIBER PO Take by mouth daily. Either Benefiber or Metamucil     fluticasone (FLONASE) 50 MCG/ACT nasal spray Place 1 spray into both nostrils daily.     gabapentin (NEURONTIN) 300 MG capsule TAKE 1 CAPSULE(300 MG) BY MOUTH THREE TIMES DAILY (Patient taking differently: Take 300 mg by mouth daily.) 90 capsule 3   levocetirizine (XYZAL) 5 MG tablet Take 5 mg by mouth every evening.      montelukast (SINGULAIR) 10 MG tablet Take 10 mg by mouth at bedtime.     spironolactone (ALDACTONE) 25 MG tablet TAKE 1 TABLET(25 MG) BY MOUTH DAILY 90 tablet 3   UNABLE TO FIND Take by mouth daily. Med Name: ViActiv     valACYclovir (VALTREX) 500 MG tablet Take 1 tablet po bid x 5 days as needed for outbreak 30 tablet 1   valsartan (DIOVAN) 160 MG tablet Take 1 tablet (160 mg total) by mouth daily. 90 tablet 3   nitroGLYCERIN (NITROSTAT) 0.4 MG SL tablet Place 1 tablet (0.4 mg total) under the tongue every 5 (five) minutes as needed for chest pain. 90 tablet 3   Facility-Administered Medications Prior to Visit  Medication Dose Route Frequency Provider Last Rate Last Admin   gi cocktail (Maalox,Lidocaine,Donnatal)  30 mL Oral Once Carollee Herter, Kendrick Fries R, DO        No Known Allergies  Review of Systems  Constitutional:  Negative for fever.  HENT:  Negative for congestion,  sinus pain and sore throat.   Respiratory:  Negative for cough, shortness of breath and wheezing.   Cardiovascular:  Negative for chest pain and palpitations.  Gastrointestinal:  Negative for blood in stool, constipation, diarrhea, nausea and vomiting.  Genitourinary:  Negative for dysuria, frequency and hematuria.  Musculoskeletal:  Negative for joint pain and myalgias.  Skin:        (-) New Moles       Objective:    Physical Exam Constitutional:      General: She is not in acute distress.    Appearance: Normal appearance. She is not ill-appearing.  HENT:     Head: Normocephalic and atraumatic.     Right Ear: Tympanic membrane, ear canal and external ear normal.     Left Ear: Tympanic membrane, ear canal and external ear normal.     Mouth/Throat:     Pharynx: No oropharyngeal exudate.  Eyes:     Extraocular Movements: Extraocular movements intact.     Pupils: Pupils are equal, round, and reactive to light.  Cardiovascular:     Rate and Rhythm: Normal rate and regular rhythm.     Heart sounds: Normal heart sounds. No murmur heard.    No gallop.  Pulmonary:     Effort: Pulmonary effort is normal. No respiratory distress.     Breath sounds: Normal breath sounds. No wheezing or rales.  Abdominal:     General: Bowel sounds are normal. There is no distension.     Palpations: Abdomen is soft.     Tenderness: There is no abdominal tenderness. There is no guarding.  Skin:    General: Skin is warm and dry.  Neurological:     Mental Status: She is alert and oriented to person, place, and time.  Psychiatric:        Judgment:  Judgment normal.     BP 126/86 (BP Location: Left Arm, Patient Position: Sitting, Cuff Size: Normal)   Pulse 68   Temp 97.7 F (36.5 C) (Oral)   Resp 18   Ht '5\' 3"'$  (1.6 m)   Wt 124 lb 3.2 oz (56.3 kg)   SpO2 93%   BMI 22.00 kg/m  Wt Readings from Last 3 Encounters:  11/19/21 124 lb 3.2 oz (56.3 kg)  10/29/21 125 lb (56.7 kg)  09/08/21 122 lb 9.6 oz  (55.6 kg)    Diabetic Foot Exam - Simple   No data filed    Lab Results  Component Value Date   WBC 5.9 01/24/2021   HGB 12.4 01/24/2021   HCT 34.4 (L) 01/24/2021   PLT 168 01/24/2021   GLUCOSE 102 (H) 05/07/2021   CHOL 140 05/07/2021   TRIG 78.0 05/07/2021   HDL 72.70 05/07/2021   LDLDIRECT 111.7 05/07/2010   LDLCALC 51 05/07/2021   ALT 17 05/07/2021   AST 15 05/07/2021   NA 136 05/07/2021   K 4.0 05/07/2021   CL 100 05/07/2021   CREATININE 0.61 05/07/2021   BUN 10 05/07/2021   CO2 30 05/07/2021   TSH 0.50 10/21/2017   MICROALBUR 0.2 02/21/2014   Lab Results  Component Value Date   TSH 0.50 10/21/2017   Lab Results  Component Value Date   WBC 5.9 01/24/2021   HGB 12.4 01/24/2021   HCT 34.4 (L) 01/24/2021   MCV 86.0 01/24/2021   PLT 168 01/24/2021   Lab Results  Component Value Date   NA 136 05/07/2021   K 4.0 05/07/2021   CO2 30 05/07/2021   GLUCOSE 102 (H) 05/07/2021   BUN 10 05/07/2021   CREATININE 0.61 05/07/2021   BILITOT 0.5 05/07/2021   ALKPHOS 60 05/07/2021   AST 15 05/07/2021   ALT 17 05/07/2021   PROT 6.9 05/07/2021   ALBUMIN 4.6 05/07/2021   CALCIUM 9.1 05/07/2021   ANIONGAP 8 01/24/2021   GFR 92.07 05/07/2021   Lab Results  Component Value Date   CHOL 140 05/07/2021   Lab Results  Component Value Date   HDL 72.70 05/07/2021   Lab Results  Component Value Date   LDLCALC 51 05/07/2021   Lab Results  Component Value Date   TRIG 78.0 05/07/2021   Lab Results  Component Value Date   CHOLHDL 2 05/07/2021   No results found for: "HGBA1C"     Assessment & Plan:   Problem List Items Addressed This Visit       Unprioritized   Primary hypertension    Well controlled, no changes to meds. Encouraged heart healthy diet such as the DASH diet and exercise as tolerated.       Relevant Orders   CBC with Differential/Platelet   Comprehensive metabolic panel   Lipid panel   Preventative health care    ghm utd Check labs  See  avs       Relevant Orders   CBC with Differential/Platelet   Comprehensive metabolic panel   Lipid panel   Hyperlipidemia    Encourage heart healthy diet such as MIND or DASH diet, increase exercise, avoid trans fats, simple carbohydrates and processed foods, consider a krill or fish or flaxseed oil cap daily.       Relevant Orders   Lipid panel   Coronary artery disease involving native heart with angina pectoris (HCC)    stable      Age-related osteoporosis without current pathological fracture  On prolia con't ca and vita d and weight bearing exericse      Other Visit Diagnoses     Need for influenza vaccination    -  Primary   Relevant Orders   Flu Vaccine QUAD High Dose(Fluad) (Completed)      No orders of the defined types were placed in this encounter.   IAnn Held, DO, personally preformed the services described in this documentation.  All medical record entries made by the scribe were at my direction and in my presence.  I have reviewed the chart and discharge instructions (if applicable) and agree that the record reflects my personal performance and is accurate and complete. 11/19/2021   I,Amber Collins,acting as a scribe for Ann Held, DO.,have documented all relevant documentation on the behalf of Ann Held, DO,as directed by  Ann Held, DO while in the presence of Ann Held, DO.    Ann Held, DO

## 2021-11-19 NOTE — Assessment & Plan Note (Signed)
On prolia con't ca and vita d and weight bearing exericse

## 2021-11-19 NOTE — Assessment & Plan Note (Signed)
Well controlled, no changes to meds. Encouraged heart healthy diet such as the DASH diet and exercise as tolerated.  °

## 2021-11-23 ENCOUNTER — Encounter: Payer: Self-pay | Admitting: Family Medicine

## 2021-12-02 DIAGNOSIS — D2261 Melanocytic nevi of right upper limb, including shoulder: Secondary | ICD-10-CM | POA: Diagnosis not present

## 2021-12-02 DIAGNOSIS — L821 Other seborrheic keratosis: Secondary | ICD-10-CM | POA: Diagnosis not present

## 2021-12-02 DIAGNOSIS — L578 Other skin changes due to chronic exposure to nonionizing radiation: Secondary | ICD-10-CM | POA: Diagnosis not present

## 2021-12-02 DIAGNOSIS — D2371 Other benign neoplasm of skin of right lower limb, including hip: Secondary | ICD-10-CM | POA: Diagnosis not present

## 2021-12-09 ENCOUNTER — Encounter: Payer: Self-pay | Admitting: Family Medicine

## 2021-12-28 ENCOUNTER — Ambulatory Visit (INDEPENDENT_AMBULATORY_CARE_PROVIDER_SITE_OTHER): Payer: Medicare PPO | Admitting: *Deleted

## 2021-12-28 DIAGNOSIS — Z0000 Encounter for general adult medical examination without abnormal findings: Secondary | ICD-10-CM

## 2021-12-28 NOTE — Progress Notes (Signed)
Subjective:   [name] is a 66 y.o. female who presents for Medicare Annual (Subsequent) preventive examination.  I connected with  [name] on 12/28/21 by a audio enabled telemedicine application and verified that I am speaking with the correct person using two identifiers.  Patient Location: Home  Provider Location: Office/Clinic  I discussed the limitations of evaluation and management by telemedicine. The patient expressed understanding and agreed to proceed.   Review of Systems    Defer to PCP Cardiac Risk Factors include: advanced age (>39mn, >>71women);dyslipidemia;hypertension     Objective:    There were no vitals filed for this visit. There is no height or weight on file to calculate BMI.     12/28/2021   10:21 AM 01/24/2021    9:10 PM 12/24/2020   11:04 AM 08/06/2019    6:27 AM 11/03/2018    1:26 PM  Advanced Directives  Does Patient Have a Medical Advance Directive? Yes Yes No Yes Yes  Type of Advance Directive Living will   [place] will  Does patient want to make changes to medical advance directive? No - Patient declined  Yes (MAU/Ambulatory/Procedural Areas - Information given)    Copy of [place] Chart?    Yes - validated most recent copy scanned in chart (See row information)     Current Medications (verified) Outpatient Encounter Medications as of 12/28/2021  Medication Sig   acetaminophen (TYLENOL) 500 MG tablet Take 500 mg by mouth every 6 (six) hours as needed for moderate pain or headache.   albuterol (VENTOLIN HFA) 108 (90 Base) MCG/ACT inhaler Inhale 1-2 puffs into the lungs every 6 (six) hours as needed for wheezing or shortness of breath.   aspirin EC 81 MG tablet Take 81 mg by mouth daily. Swallow whole.   atorvastatin (LIPITOR) 20 MG tablet TAKE 1 TABLET(20 MG) BY MOUTH DAILY   azelastine (ASTELIN) 0.1 % nasal spray Place 1 spray into both nostrils 2 (two) times daily.    denosumab (PROLIA) 60 MG/ML SOSY injection Inject 60 mg into the skin every 6 (six) months.   FIBER PO Take by mouth daily. Either Benefiber or Metamucil   fluticasone (FLONASE) 50 MCG/ACT nasal spray Place 1 spray into both nostrils daily.   gabapentin (NEURONTIN) 300 MG capsule TAKE 1 CAPSULE(300 MG) BY MOUTH THREE TIMES DAILY (Patient taking differently: Take 300 mg by mouth daily.)   levocetirizine (XYZAL) 5 MG tablet Take 5 mg by mouth every evening.    montelukast (SINGULAIR) 10 MG tablet Take 10 mg by mouth at bedtime.   nitroGLYCERIN (NITROSTAT) 0.4 MG SL tablet Place 1 tablet (0.4 mg total) under the tongue every 5 (five) minutes as needed for chest pain.   spironolactone (ALDACTONE) 25 MG tablet TAKE 1 TABLET(25 MG) BY MOUTH DAILY   UNABLE TO FIND Take by mouth daily. Med Name: ViActiv   valACYclovir (VALTREX) 500 MG tablet Take 1 tablet po bid x 5 days as needed for outbreak   valsartan (DIOVAN) 160 MG tablet Take 1 tablet (160 mg total) by mouth daily.   Facility-Administered Encounter Medications as of 12/28/2021  Medication   gi cocktail (Maalox,Lidocaine,Donnatal)    Allergies (verified) Patient has no known allergies.   History: Past Medical History:  Diagnosis Date   Allergy    Cataract    Chronic cough 01/24/2019   Costochondritis 01/24/2019   Epigastric pain 07/29/2016   FIBROIDS, UTERUS 01/18/2007   Qualifier:  Diagnosis of  By: Jerold Coombe     H/O Bell's palsy 05/2010   HYPERTENSION 03/24/2007   Qualifier: Diagnosis of  By: Ronnald Ramp CMA, Chemira     MENORRHAGIA, PERIMENOPAUSAL 01/13/2007   Qualifier: Diagnosis of  By: Jerold Coombe     MOLE 05/07/2010   Qualifier: Diagnosis of  By: Jerold Coombe     Osteopenia 08/23/2014   RUQ pain 07/29/2016   Spontaneous pneumothorax 2005   R, s/p tube, then VATS   Spontaneous tension pneumothorax 03/24/2007   Qualifier: Diagnosis of  By: Jerold Coombe     Past Surgical History:  Procedure Laterality Date    BRONCHIAL WASHINGS  08/06/2019   Procedure: BRONCHIAL lavage;  Surgeon: Brand Males, MD;  Location: [facility];  Service: Endoscopy;;   CATARACT EXTRACTION W/ INTRAOCULAR LENS IMPLANT Bilateral    june 2023   CESAREAN SECTION     CHEST TUBE INSERTION  2005   spontaneus Pneumothorax   COLONOSCOPY     SPIROMETRY  01/20/2015   Dr. Donneta Romberg   VIDEO ASSISTED THORACOSCOPY  2005   VIDEO BRONCHOSCOPY N/A 08/06/2019   Procedure: VIDEO BRONCHOSCOPY WITHOUT FLUORO;  Surgeon: Brand Males, MD;  Location: [facility];  Service: Endoscopy;  Laterality: N/A;   WISDOM TOOTH EXTRACTION     Family History  Problem Relation Age of Onset   Hypertension Mother    Stroke Mother    Heart disease Father    Hypertension Father    Cancer Maternal Grandmother 55       breast   Diabetes Maternal Grandmother    Colon cancer Neg Hx    Esophageal cancer Neg Hx    Rectal cancer Neg Hx    Stomach cancer Neg Hx    Social History   Socioeconomic History   Marital status: Married    Spouse name: Not on file   Number of children: 3   Years of education: Not on file   Highest education level: Not on file  Occupational History   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[employer]    Employer: [employer]  Tobacco Use   Smoking status: Former    Packs/day: 1.00    Years: 14.00    Total pack years: 14.00    Types: Cigarettes    Quit date: 07/11/1982    Years since quitting: 39.4   Smokeless tobacco: Never  Vaping Use   Vaping Use: Never used  Substance and Sexual Activity   Alcohol use: Yes    Alcohol/week: 0.0 standard drinks of alcohol    Comment: rare   Drug use: No   Sexual activity: Yes    Partners: Male    Birth control/protection: Post-menopausal    Comment: 1st intercourse 66 yo-Fewer than 5 partners  Other Topics Concern   Not on file  Social History Narrative   Exercise-- jazzersize  3x a week    Social Determinants of Health   Financial Resource Strain: Low  Risk  (12/24/2020)   Overall Financial Resource Strain (CARDIA)    Difficulty of Paying Living Expenses: Not hard at all  Food Insecurity: No Food Insecurity (12/28/2021)   Hunger Vital Sign    Worried About Running Out of Food in the Last Year: Never true    [place] in the Last Year: Never true  Transportation Needs: No Transportation Needs (12/28/2021)   PRAPARE - Hydrologist (Medical): No    Lack of Transportation (Non-Medical): No  Physical  Activity: Sufficiently Active (12/24/2020)   Exercise Vital Sign    Days of Exercise per Week: 6 days    Minutes of Exercise per Session: 40 min  Stress: No Stress Concern Present (12/24/2020)   [place]    Feeling of Stress : Not at all  Social Connections: Moderately Isolated (12/24/2020)   Social Connection and Isolation Panel [NHANES]    Frequency of Communication with Friends and Family: More than three times a week    Frequency of Social Gatherings with Friends and Family: More than three times a week    Attends Religious Services: Never    Marine scientist or Organizations: No    Attends Music therapist: Never    Marital Status: Married    Tobacco Counseling Counseling given: Not Answered   Clinical Intake:  Pre-visit preparation completed: Yes  Pain : No/denies pain  Diabetes: No  How often do you need to have someone help you when you read instructions, pamphlets, or other written materials from your doctor or pharmacy?: 1 - Never   Activities of Daily Living    12/28/2021   10:28 AM  In your present state of health, do you have any difficulty performing the following activities:  Hearing? 0  Vision? 0  Difficulty concentrating or making decisions? 0  Walking or climbing stairs? 0  Dressing or bathing? 0  Doing errands, shopping? 0  Preparing Food and eating ? N  Using the Toilet? N  In the past  six months, have you accidently leaked urine? Y  Comment stress incontinence when coughing  Do you have problems with loss of bowel control? N  Managing your Medications? N  Managing your Finances? N  Housekeeping or managing your Housekeeping? N    Patient Care Team: Carollee Herter, Alferd Apa, DO as PCP - General Sueanne Margarita, MD as PCP - Cardiology (Cardiology) Mosetta Anis, MD as Referring Physician (Allergy) Brand Males, MD as Consulting Physician (Pulmonary Disease) Jari Pigg, MD as Consulting Physician (Dermatology)  Indicate any recent Medical Services you may have received from other than [facility] providers in the past year (date may be approximate).     Assessment:   This is a routine wellness examination for [place].  Hearing/Vision screen No results found.  Dietary issues and exercise activities discussed: Current Exercise Habits: Home exercise routine, Type of exercise: Other - see comments;stretching;strength training/weights;exercise ball (Jazzercise), Time (Minutes): 60, Frequency (Times/Week): 6, Weekly Exercise (Minutes/Week): 360, Intensity: Mild, Exercise limited by: None identified   Goals Addressed   None    Depression Screen    12/28/2021   10:24 AM 11/19/2021    9:15 AM 12/24/2020   11:10 AM 11/06/2020   10:20 AM 11/05/2019    1:01 PM 11/03/2018    1:23 PM 10/21/2017    9:32 AM  PHQ 2/9 Scores  PHQ - 2 Score 0 0 0 0 0 0 0  PHQ- 9 Score      0 1    Fall Risk    12/28/2021   10:24 AM 11/19/2021    9:15 AM 12/24/2020   11:09 AM 11/06/2020   10:20 AM 11/05/2019    1:01 PM  [place] in the past year? 0 0 0 0 0  Number falls in past yr: 0 0 0 0 0  Injury with Fall? 0 0 0 0 0  Risk for fall due to : No Fall Risks  Follow up Falls evaluation completed Falls evaluation completed Falls prevention discussed Falls evaluation completed Falls evaluation completed    FALL RISK PREVENTION PERTAINING TO THE HOME:  Any stairs in or around the  home? Yes  If so, are there any without handrails? No  Home free of loose throw rugs in walkways, pet beds, electrical cords, etc? Yes  Adequate lighting in your home to reduce risk of falls? Yes   ASSISTIVE DEVICES UTILIZED TO PREVENT FALLS:  Life alert? No  Use of a cane, walker or w/c? No  Grab bars in the bathroom? No  Shower chair or bench in shower? No  Elevated toilet seat or a handicapped toilet? No   TIMED UP AND GO:  Was the test performed?  No, audio visit .   Cognitive Function:    11/03/2018    1:24 PM  MMSE - Mini Mental State Exam  Orientation to time 5  Orientation to Place 5  Registration 3  Attention/ Calculation 5  Recall 3  Language- name 2 objects 2  Language- repeat 1  Language- follow 3 step command 3  Language- read & follow direction 1  Write a sentence 1  Copy design 1  Total score 30        12/28/2021   10:53 AM  6CIT Screen  What Year? 0 points  What month? 0 points  What time? 0 points  Count back from 20 0 points  Months in reverse 0 points  Repeat phrase 0 points  Total Score 0 points    Immunizations Immunization History  Administered Date(s) Administered   DTaP 02/03/2019   Fluad Quad(high Dose 65+) 11/03/2018, 11/06/2020, 11/19/2021   Influenza Whole 11/30/2006, 12/13/2007, 01/01/2009, 12/31/2009   Influenza, High Dose Seasonal PF 04/19/2016, 04/12/2017, 03/14/2018, 04/30/2019   Influenza,inj,Quad PF,6+ Mos 11/27/2017   Influenza-Unspecified 10/24/2013, 11/28/2016, 11/23/2019   PFIZER(Purple Top)SARS-COV-2 Vaccination 03/31/2019, 04/25/2019, 11/23/2019, 09/01/2020, 12/03/2020   Pfizer Covid-19 Vaccine Bivalent Booster 5yr & up 11/23/2021   Pneumococcal Conjugate-13 11/03/2018   Pneumococcal Polysaccharide-23 04/12/2017, 03/14/2018, 04/30/2019, 11/05/2019, 11/04/2020   Respiratory Syncytial Virus Vaccine,Recomb Aduvanted(Arexvy) 12/07/2021   Td 08/28/2002, 04/18/2008   Tdap 01/24/2019   Zoster Recombinat (Shingrix)  11/28/2016, 02/04/2017   Zoster, Live 08/20/2013    TDAP status: Up to date  Flu Vaccine status: Up to date  Pneumococcal vaccine status: Up to date  Covid-19 vaccine status: Information provided on how to obtain vaccines.   Qualifies for Shingles Vaccine? Yes   Zostavax completed Yes   Shingrix Completed?: Yes  Screening Tests Health Maintenance  Topic Date Due   Medicare Annual Wellness (AWV)  12/24/2021   COVID-19 Vaccine (7 - Pfizer risk series) 01/18/2022   MAMMOGRAM  07/30/2022   DEXA SCAN  07/30/2023   COLONOSCOPY (Pts 45-472yrInsurance coverage will need to be confirmed)  10/27/2027   TETANUS/TDAP  01/23/2029   Pneumonia Vaccine 6571Years old  Completed   INFLUENZA VACCINE  Completed   Hepatitis C Screening  Completed   Zoster Vaccines- Shingrix  Completed   HPV VACCINES  Aged Out    Health Maintenance  Health Maintenance Due  Topic Date Due   Medicare Annual Wellness (AWV)  12/24/2021    Colorectal cancer screening: Type of screening: Colonoscopy. Completed 10/26/17. Repeat every 10 years  Mammogram status: Completed 07/29/21. Repeat every year  Bone Density status: Completed 07/29/21. Results reflect: Bone density results: OSTEOPOROSIS. Repeat every 2 years.  Lung Cancer Screening: (Low Dose CT Chest recommended if Age 68-80ears,  30 pack-year currently smoking OR have quit w/in 15years.) does not qualify.   Lung Cancer Screening Referral: N/a  Additional Screening:  Hepatitis C Screening: does qualify; Completed 09/23/15  Vision Screening: Recommended annual ophthalmology exams for early detection of glaucoma and other disorders of the eye. Is the patient up to date with their annual eye exam?  No  Who is the provider or what is the name of the office in which the patient attends annual eye exams? Dr. Tommy Rainwater and Dr. Laban Emperor If pt is not established with a provider, would they like to be referred to a provider to establish care? No .   Dental Screening:  Recommended annual dental exams for proper oral hygiene  Community Resource Referral / Chronic Care Management: CRR required this visit?  No   CCM required this visit?  No      Plan:     I have personally reviewed and noted the following in the patient's chart:   Medical and social history Use of alcohol, tobacco or illicit drugs  Current medications and supplements including opioid prescriptions. Patient is not currently taking opioid prescriptions. Functional ability and status Nutritional status Physical activity Advanced directives List of other physicians Hospitalizations, surgeries, and ER visits in previous 12 months Vitals Screenings to include cognitive, depression, and falls Referrals and appointments  In addition, I have reviewed and discussed with patient certain preventive protocols, quality metrics, and best practice recommendations. A written personalized care plan for preventive services as well as general preventive health recommendations were provided to patient.   Due to this being a telephonic visit, the after visit summary with patients personalized plan was offered to patient via mail or my-chart. Patient would like to access on my-chart  Beatris Ship, [place]   12/28/2021   Nurse Notes: None

## 2021-12-28 NOTE — Patient Instructions (Signed)
[name] , Thank you for taking time to come for your Medicare Wellness Visit. I appreciate your ongoing commitment to your health goals. Please review the following plan we discussed and let me know if I can assist you in the future.   These are the goals we discussed:  Goals      Patient Stated     Eat healthy, drink plenty of water & continue exercising        This is a list of the screening recommended for you and due dates:  Health Maintenance  Topic Date Due   COVID-19 Vaccine (7 - Pfizer risk series) 01/18/2022   Mammogram  07/30/2022   Medicare Annual Wellness Visit  12/29/2022   DEXA scan (bone density measurement)  07/30/2023   Colon Cancer Screening  10/27/2027   Tetanus Vaccine  01/23/2029   Pneumonia Vaccine  Completed   Flu Shot  Completed   Hepatitis C Screening: USPSTF Recommendation to screen - Ages 18-79 yo.  Completed   Zoster (Shingles) Vaccine  Completed   HPV Vaccine  Aged Out     Next appointment: Follow up in one year for your annual wellness visit.   Preventive Care 68 Years and Older, Female Preventive care refers to lifestyle choices and visits with your health care provider that can promote health and wellness. What does preventive care include? A yearly physical exam. This is also called an annual well check. Dental exams once or twice a year. Routine eye exams. Ask your health care provider how often you should have your eyes checked. Personal lifestyle choices, including: Daily care of your teeth and gums. Regular physical activity. Eating a healthy diet. Avoiding tobacco and drug use. Limiting alcohol use. Practicing safe sex. Taking low-dose aspirin every day. Taking vitamin and mineral supplements as recommended by your health care provider. What happens during an annual well check? The services and screenings done by your health care provider during your annual well check will depend on your age, overall health, lifestyle risk  factors, and family history of disease. Counseling  Your health care provider may ask you questions about your: Alcohol use. Tobacco use. Drug use. Emotional well-being. Home and relationship well-being. Sexual activity. Eating habits. History of falls. Memory and ability to understand (cognition). Work and work Statistician. Reproductive health. Screening  You may have the following tests or measurements: Height, weight, and BMI. Blood pressure. Lipid and cholesterol levels. These may be checked every 5 years, or more frequently if you are over 53 years old. Skin check. Lung cancer screening. You may have this screening every year starting at age 101 if you have a 30-pack-year history of smoking and currently smoke or have quit within the past 15 years. Fecal occult blood test (FOBT) of the stool. You may have this test every year starting at age 13. Flexible sigmoidoscopy or colonoscopy. You may have a sigmoidoscopy every 5 years or a colonoscopy every 10 years starting at age 60. Hepatitis C blood test. Hepatitis B blood test. Sexually transmitted disease (STD) testing. Diabetes screening. This is done by checking your blood sugar (glucose) after you have not eaten for a while (fasting). You may have this done every 1-3 years. Bone density scan. This is done to screen for osteoporosis. You may have this done starting at age 76. Mammogram. This may be done every 1-2 years. Talk to your health care provider about how often you should have regular mammograms. Talk with your health care provider about your  test results, treatment options, and if necessary, the need for more tests. Vaccines  Your health care provider may recommend certain vaccines, such as: Influenza vaccine. This is recommended every year. Tetanus, diphtheria, and acellular pertussis (Tdap, Td) vaccine. You may need a Td booster every 10 years. Zoster vaccine. You may need this after age 56. Pneumococcal 13-valent  conjugate (PCV13) vaccine. One dose is recommended after age 27. Pneumococcal polysaccharide (PPSV23) vaccine. One dose is recommended after age 59. Talk to your health care provider about which screenings and vaccines you need and how often you need them. This information is not intended to replace advice given to you by your health care provider. Make sure you discuss any questions you have with your health care provider. Document Released: 03/07/2015 Document Revised: 10/29/2015 Document Reviewed: 12/10/2014 Elsevier Interactive Patient Education  2017 [place] Prevention in the Home Falls can cause injuries. They can happen to people of all ages. There are many things you can do to make your home safe and to help prevent falls. What can I do on the outside of my home? Regularly fix the edges of walkways and driveways and fix any cracks. Remove anything that might make you trip as you walk through a door, such as a raised step or threshold. Trim any bushes or trees on the path to your home. Use bright outdoor lighting. Clear any walking paths of anything that might make someone trip, such as rocks or tools. Regularly check to see if handrails are loose or broken. Make sure that both sides of any steps have handrails. Any raised decks and porches should have guardrails on the edges. Have any leaves, snow, or ice cleared regularly. Use sand or salt on walking paths during winter. Clean up any spills in your garage right away. This includes oil or grease spills. What can I do in the bathroom? Use night lights. Install grab bars by the toilet and in the tub and shower. Do not use towel bars as grab bars. Use non-skid mats or decals in the tub or shower. If you need to sit down in the shower, use a plastic, non-slip stool. Keep the floor dry. Clean up any water that spills on the floor as soon as it happens. Remove soap buildup in the tub or shower regularly. Attach bath mats  securely with double-sided non-slip rug tape. Do not have throw rugs and other things on the floor that can make you trip. What can I do in the bedroom? Use night lights. Make sure that you have a light by your bed that is easy to reach. Do not use any sheets or blankets that are too big for your bed. They should not hang down onto the floor. Have a firm chair that has side arms. You can use this for support while you get dressed. Do not have throw rugs and other things on the floor that can make you trip. What can I do in the kitchen? Clean up any spills right away. Avoid walking on wet floors. Keep items that you use a lot in easy-to-reach places. If you need to reach something above you, use a strong step stool that has a grab bar. Keep electrical cords out of the way. Do not use floor polish or wax that makes floors slippery. If you must use wax, use non-skid floor wax. Do not have throw rugs and other things on the floor that can make you trip. What can I do with my  stairs? Do not leave any items on the stairs. Make sure that there are handrails on both sides of the stairs and use them. Fix handrails that are broken or loose. Make sure that handrails are as long as the stairways. Check any carpeting to make sure that it is firmly attached to the stairs. Fix any carpet that is loose or worn. Avoid having throw rugs at the top or bottom of the stairs. If you do have throw rugs, attach them to the floor with carpet tape. Make sure that you have a light switch at the top of the stairs and the bottom of the stairs. If you do not have them, ask someone to add them for you. What else can I do to help prevent falls? Wear shoes that: Do not have high heels. Have rubber bottoms. Are comfortable and fit you well. Are closed at the toe. Do not wear sandals. If you use a stepladder: Make sure that it is fully opened. Do not climb a closed stepladder. Make sure that both sides of the stepladder  are locked into place. Ask someone to hold it for you, if possible. Clearly mark and make sure that you can see: Any grab bars or handrails. First and last steps. Where the edge of each step is. Use tools that help you move around (mobility aids) if they are needed. These include: Canes. Walkers. Scooters. Crutches. Turn on the lights when you go into a dark area. Replace any light bulbs as soon as they burn out. Set up your furniture so you have a clear path. Avoid moving your furniture around. If any of your floors are uneven, fix them. If there are any pets around you, be aware of where they are. Review your medicines with your doctor. Some medicines can make you feel dizzy. This can increase your chance of falling. Ask your doctor what other things that you can do to help prevent falls. This information is not intended to replace advice given to you by your health care provider. Make sure you discuss any questions you have with your health care provider. Document Released: 12/05/2008 Document Revised: 07/17/2015 Document Reviewed: 03/15/2014 Elsevier Interactive Patient Education  2017 [employer].

## 2022-01-11 NOTE — Telephone Encounter (Signed)
Forwarding to Rx Prior Auth Team 

## 2022-01-11 NOTE — Telephone Encounter (Signed)
Prolia VOB initiated via [email or web address] 

## 2022-01-18 ENCOUNTER — Other Ambulatory Visit (HOSPITAL_COMMUNITY): Payer: Self-pay

## 2022-01-19 NOTE — Telephone Encounter (Signed)
Patient ready to schedule next Prolia on 02/04/22 or after  Out-of-pocket cost due at time of visit: approximately $40   Primary: [employer] Prolia co-insurance: 0% Admin fee co-insurance: $40   Secondary: n/a Prolia co-insurance:  Admin fee co-insurance:    Deductible: does not apply   Prior Auth: APPROVED PA# [id number] Valid: 02/22/21-02/21/22     ** This summary of benefits is an estimation of the patient's out-of-pocket cost. Exact cost may very based on individual plan coverage.

## 2022-01-20 NOTE — Telephone Encounter (Signed)
MyChart Message sent  

## 2022-02-08 ENCOUNTER — Ambulatory Visit: Payer: Medicare PPO | Admitting: Family Medicine

## 2022-02-08 ENCOUNTER — Other Ambulatory Visit (HOSPITAL_BASED_OUTPATIENT_CLINIC_OR_DEPARTMENT_OTHER): Payer: Self-pay

## 2022-02-08 ENCOUNTER — Encounter: Payer: Self-pay | Admitting: Family Medicine

## 2022-02-08 VITALS — BP 138/88 | HR 69 | Temp 98.0°F | Resp 18 | Ht 63.0 in | Wt 125.8 lb

## 2022-02-08 DIAGNOSIS — R300 Dysuria: Secondary | ICD-10-CM | POA: Diagnosis not present

## 2022-02-08 DIAGNOSIS — A6004 Herpesviral vulvovaginitis: Secondary | ICD-10-CM

## 2022-02-08 LAB — POC URINALSYSI DIPSTICK (AUTOMATED)
Bilirubin, UA: NEGATIVE
Blood, UA: NEGATIVE
Glucose, UA: NEGATIVE
Ketones, UA: NEGATIVE
Leukocytes, UA: NEGATIVE
Nitrite, UA: NEGATIVE
Protein, UA: NEGATIVE
Spec Grav, UA: <=1.005 — AB (ref 1.010–1.025)
Urobilinogen, UA: 0.2 E.U./dL
pH, UA: 6.5 (ref 5.0–8.0)

## 2022-02-08 MED ORDER — VALACYCLOVIR HCL 1 G PO TABS
1000.0000 mg | ORAL_TABLET | Freq: Three times a day (TID) | ORAL | 5 refills | Status: DC
  Filled 2022-02-08: qty 30, 10d supply, fill #0
  Filled 2022-08-25: qty 30, 10d supply, fill #1

## 2022-02-08 NOTE — Progress Notes (Signed)
Subjective:   By signing my name below, I, [name], attest that this documentation has been prepared under the direction and in the presence of Ann Held DO 02/08/2022     Patient ID: [name], female    DOB: [birth year], 66 y.o.   MRN: [id number]  Chief Complaint  Patient presents with   Dysuria    X1 week, Pt states having burning and freq and some abdominal pain    HPI Patient is in today for an office visit  Patient presents with skin burning and welts near her vaginal area, urinary frequency and tenderness in her suprapubic area. Symptoms appeared about a week ago.  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 Donneta Romberg   VIDEO ASSISTED THORACOSCOPY  2005   VIDEO BRONCHOSCOPY  N/A 08/06/2019   Procedure: VIDEO BRONCHOSCOPY WITHOUT FLUORO;  Surgeon: Brand Males, MD;  Location: [facility];  Service: Endoscopy;  Laterality: N/A;   WISDOM TOOTH EXTRACTION      Family History  Problem Relation Age of Onset   Hypertension Mother    Stroke Mother    Heart disease Father    Hypertension Father    Cancer Maternal Grandmother 43       breast   Diabetes Maternal Grandmother    Colon cancer Neg Hx    Esophageal cancer Neg Hx    Rectal cancer Neg Hx    Stomach cancer Neg Hx     Social History   Socioeconomic History   Marital status: Married    Spouse name: Not on file   Number of children: 3   Years of education: Not on file   Highest education level: Not on file  Occupational History   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    Employer: [employer]  Tobacco Use   Smoking status: Former    Packs/day: 1.00    Years: 14.00    Total pack years: 14.00    Types: Cigarettes    Quit date: 07/11/1982    Years since quitting: 39.6   Smokeless tobacco: Never  Vaping Use  Vaping Use: Never used  Substance and Sexual Activity   Alcohol use: Yes    Alcohol/week: 0.0 standard drinks of alcohol    Comment: rare   Drug use: No   Sexual activity: Yes    Partners: Male    Birth control/protection: Post-menopausal    Comment: 1st intercourse 66 yo-Fewer than 5 partners  Other Topics Concern   Not on file  Social History Narrative   Exercise-- jazzersize  3x a week    Social Determinants of Health   Financial Resource Strain: Low Risk  (12/24/2020)   Overall Financial Resource Strain (CARDIA)    Difficulty of Paying Living Expenses: Not hard at all  Food Insecurity: No Food Insecurity (12/28/2021)   Hunger Vital Sign    Worried About Running Out of Food in the Last Year: Never true    [place] in the Last Year: Never true  Transportation Needs: No Transportation Needs (12/28/2021)   PRAPARE - Radiographer, therapeutic (Medical): No    Lack of Transportation (Non-Medical): No  Physical Activity: Sufficiently Active (12/24/2020)   Exercise Vital Sign    Days of Exercise per Week: 6 days    Minutes of Exercise per Session: 40 min  Stress: No Stress Concern Present (12/24/2020)   [place]    Feeling of Stress : Not at all  Social Connections: Moderately Isolated (12/24/2020)   Social Connection and Isolation Panel [NHANES]    Frequency of Communication with Friends and Family: More than three times a week    Frequency of Social Gatherings with Friends and Family: More than three times a week    Attends Religious Services: Never    Marine scientist or Organizations: No    Attends Archivist Meetings: Never    Marital Status: Married  Human resources officer Violence: Not At Risk (12/28/2021)   Humiliation, Afraid, Rape, and Kick questionnaire    Fear of Current or Ex-Partner: No    Emotionally Abused: No    Physically Abused: No    Sexually Abused: No    Outpatient Medications Prior to Visit  Medication Sig Dispense Refill   acetaminophen (TYLENOL) 500 MG tablet Take 500 mg by mouth every 6 (six) hours as needed for moderate pain or headache.     albuterol (VENTOLIN HFA) 108 (90 Base) MCG/ACT inhaler Inhale 1-2 puffs into the lungs every 6 (six) hours as needed for wheezing or shortness of breath.     aspirin EC 81 MG tablet Take 81 mg by mouth daily. Swallow whole.     atorvastatin (LIPITOR) 20 MG tablet TAKE 1 TABLET(20 MG) BY MOUTH DAILY 90 tablet 2   azelastine (ASTELIN) 0.1 % nasal spray Place 1 spray into both nostrils 2 (two) times daily.     denosumab (PROLIA) 60 MG/ML SOSY injection Inject 60 mg into the skin every 6 (six) months.     FIBER PO Take by mouth daily. Either Benefiber or Metamucil     fluticasone (FLONASE) 50 MCG/ACT nasal spray Place 1 spray into both nostrils daily.     gabapentin  (NEURONTIN) 300 MG capsule TAKE 1 CAPSULE(300 MG) BY MOUTH THREE TIMES DAILY (Patient taking differently: Take 300 mg by mouth daily.) 90 capsule 3   levocetirizine (XYZAL) 5 MG tablet Take 5 mg by mouth every evening.      montelukast (SINGULAIR) 10 MG tablet Take 10 mg by mouth at bedtime.  spironolactone (ALDACTONE) 25 MG tablet TAKE 1 TABLET(25 MG) BY MOUTH DAILY 90 tablet 3   UNABLE TO FIND Take by mouth daily. Med Name: ViActiv     valACYclovir (VALTREX) 500 MG tablet Take 1 tablet po bid x 5 days as needed for outbreak 30 tablet 1   valsartan (DIOVAN) 160 MG tablet Take 1 tablet (160 mg total) by mouth daily. 90 tablet 3   nitroGLYCERIN (NITROSTAT) 0.4 MG SL tablet Place 1 tablet (0.4 mg total) under the tongue every 5 (five) minutes as needed for chest pain. 90 tablet 3   Facility-Administered Medications Prior to Visit  Medication Dose Route Frequency Provider Last Rate Last Admin   gi cocktail (Maalox,Lidocaine,Donnatal)  30 mL Oral Once Carollee Herter, Kendrick Fries R, DO        No Known Allergies  Review of Systems  Constitutional:  Negative for fever and malaise/fatigue.  HENT:  Negative for congestion.   Eyes:  Negative for blurred vision.  Respiratory:  Negative for cough and shortness of breath.   Cardiovascular:  Negative for chest pain, palpitations and leg swelling.  Gastrointestinal:  Positive for abdominal pain (suprapubic area). Negative for vomiting.  Genitourinary:  Positive for dysuria and frequency.  Musculoskeletal:  Negative for back pain.  Skin:  Negative for rash.       (+) Burning (Vaginal Area) (+) Welts (Vaginal Area)  Neurological:  Negative for loss of consciousness and headaches.       Objective:    Physical Exam Vitals and nursing note reviewed.  Constitutional:      General: She is not in acute distress.    Appearance: Normal appearance. She is not ill-appearing.  HENT:     Head: Normocephalic and atraumatic.     Right Ear: External ear normal.      Left Ear: External ear normal.  Eyes:     Extraocular Movements: Extraocular movements intact.     Pupils: Pupils are equal, round, and reactive to light.  Cardiovascular:     Rate and Rhythm: Normal rate and regular rhythm.     Heart sounds: Normal heart sounds. No murmur heard.    No gallop.  Pulmonary:     Effort: Pulmonary effort is normal. No respiratory distress.     Breath sounds: Normal breath sounds. No wheezing or rales.  Abdominal:     General: There is no distension.     Palpations: Abdomen is soft.     Tenderness: There is no abdominal tenderness. There is no right CVA tenderness, left CVA tenderness, guarding or rebound.  Genitourinary:    Labia:        Left: Lesion (Open, Left, Posterior) present.   Skin:    General: Skin is warm and dry.  Neurological:     Mental Status: She is alert and oriented to person, place, and time.  Psychiatric:        Judgment: Judgment normal.    BP 138/88 (BP Location: Left Arm, Patient Position: Sitting, Cuff Size: Normal)   Pulse 69   Temp 98 F (36.7 C) (Oral)   Resp 18   Ht '5\' 3"'$  (1.6 m)   Wt 125 lb 12.8 oz (57.1 kg)   SpO2 99%   BMI 22.28 kg/m  Wt Readings from Last 3 Encounters:  02/08/22 125 lb 12.8 oz (57.1 kg)  11/19/21 124 lb 3.2 oz (56.3 kg)  10/29/21 125 lb (56.7 kg)    Diabetic Foot Exam - Simple   No data filed  Lab Results  Component Value Date   WBC 4.4 11/19/2021   HGB 13.0 11/19/2021   HCT 37.7 11/19/2021   PLT 181.0 11/19/2021   GLUCOSE 92 11/19/2021   CHOL 143 11/19/2021   TRIG 75.0 11/19/2021   HDL 65.80 11/19/2021   LDLDIRECT 111.7 05/07/2010   LDLCALC 62 11/19/2021   ALT 21 11/19/2021   AST 15 11/19/2021   NA 138 11/19/2021   K 3.7 11/19/2021   CL 102 11/19/2021   CREATININE 0.62 11/19/2021   BUN 12 11/19/2021   CO2 30 11/19/2021   TSH 0.50 10/21/2017   MICROALBUR 0.2 02/21/2014    Lab Results  Component Value Date   TSH 0.50 10/21/2017   Lab Results  Component Value  Date   WBC 4.4 11/19/2021   HGB 13.0 11/19/2021   HCT 37.7 11/19/2021   MCV 88.9 11/19/2021   PLT 181.0 11/19/2021   Lab Results  Component Value Date   NA 138 11/19/2021   K 3.7 11/19/2021   CO2 30 11/19/2021   GLUCOSE 92 11/19/2021   BUN 12 11/19/2021   CREATININE 0.62 11/19/2021   BILITOT 0.6 11/19/2021   ALKPHOS 66 11/19/2021   AST 15 11/19/2021   ALT 21 11/19/2021   PROT 6.8 11/19/2021   ALBUMIN 4.3 11/19/2021   CALCIUM 8.7 11/19/2021   ANIONGAP 8 01/24/2021   GFR 91.36 11/19/2021   Lab Results  Component Value Date   CHOL 143 11/19/2021   Lab Results  Component Value Date   HDL 65.80 11/19/2021   Lab Results  Component Value Date   LDLCALC 62 11/19/2021   Lab Results  Component Value Date   TRIG 75.0 11/19/2021   Lab Results  Component Value Date   CHOLHDL 2 11/19/2021   No results found for: "HGBA1C"     Assessment & Plan:   Problem List Items Addressed This Visit   None Visit Diagnoses     Dysuria    -  Primary   Relevant Orders   POCT Urinalysis Dipstick (Automated) (Completed)   Herpes simplex vulvovaginitis       Relevant Medications   valACYclovir (VALTREX) 1000 MG tablet      Meds ordered this encounter  Medications   valACYclovir (VALTREX) 1000 MG tablet    Sig: Take 1 tablet (1,000 mg total) by mouth 3 (three) times daily.    Dispense:  30 tablet    Refill:  5    I, Ann Held, DO, personally preformed the services described in this documentation.  All medical record entries made by the scribe were at my direction and in my presence.  I have reviewed the chart and discharge instructions (if applicable) and agree that the record reflects my personal performance and is accurate and complete. 02/08/2022   I,Amber Collins,acting as a scribe for Ann Held, DO.,have documented all relevant documentation on the behalf of Ann Held, DO,as directed by  Ann Held, DO while in the presence of  Ann Held, DO.    Ann Held, DO

## 2022-02-10 ENCOUNTER — Ambulatory Visit (INDEPENDENT_AMBULATORY_CARE_PROVIDER_SITE_OTHER): Payer: Medicare PPO

## 2022-02-10 DIAGNOSIS — M8580 Other specified disorders of bone density and structure, unspecified site: Secondary | ICD-10-CM

## 2022-02-10 DIAGNOSIS — M810 Age-related osteoporosis without current pathological fracture: Secondary | ICD-10-CM | POA: Diagnosis not present

## 2022-02-10 MED ORDER — DENOSUMAB 60 MG/ML ~~LOC~~ SOSY
60.0000 mg | PREFILLED_SYRINGE | Freq: Once | SUBCUTANEOUS | Status: AC
  Administered 2022-02-10: 60 mg via SUBCUTANEOUS

## 2022-02-10 NOTE — Progress Notes (Signed)
Pt here for Prolia shot. Shot given in left arm subcutaneously. Pt handled well and states no problems with the shot.

## 2022-02-23 DIAGNOSIS — H35033 Hypertensive retinopathy, bilateral: Secondary | ICD-10-CM | POA: Diagnosis not present

## 2022-02-23 DIAGNOSIS — H34832 Tributary (branch) retinal vein occlusion, left eye, with macular edema: Secondary | ICD-10-CM | POA: Diagnosis not present

## 2022-02-23 DIAGNOSIS — H43813 Vitreous degeneration, bilateral: Secondary | ICD-10-CM | POA: Diagnosis not present

## 2022-03-05 ENCOUNTER — Ambulatory Visit: Payer: Medicare PPO | Admitting: Family Medicine

## 2022-03-05 ENCOUNTER — Encounter: Payer: Self-pay | Admitting: Family Medicine

## 2022-03-05 ENCOUNTER — Ambulatory Visit (HOSPITAL_BASED_OUTPATIENT_CLINIC_OR_DEPARTMENT_OTHER)
Admission: RE | Admit: 2022-03-05 | Discharge: 2022-03-05 | Disposition: A | Discharge status: Home / Self Care | Payer: Medicare PPO | Source: Ambulatory Visit | Attending: Family Medicine | Admitting: Family Medicine

## 2022-03-05 VITALS — BP 138/88 | HR 70 | Temp 98.0°F | Resp 18 | Ht 63.0 in | Wt 128.2 lb

## 2022-03-05 DIAGNOSIS — Q740 Other congenital malformations of upper limb(s), including shoulder girdle: Secondary | ICD-10-CM

## 2022-03-05 DIAGNOSIS — Z9889 Other specified postprocedural states: Secondary | ICD-10-CM | POA: Diagnosis not present

## 2022-03-05 DIAGNOSIS — R2231 Localized swelling, mass and lump, right upper limb: Secondary | ICD-10-CM | POA: Diagnosis not present

## 2022-03-05 DIAGNOSIS — M19011 Primary osteoarthritis, right shoulder: Secondary | ICD-10-CM | POA: Diagnosis not present

## 2022-03-05 NOTE — Progress Notes (Addendum)
Subjective:   By signing my name below, I, [name], attest that this documentation has been prepared under the direction and in the presence of Ann Held, DO. 03/05/2022   Patient ID: [name], female    DOB: [birth year], 67 y.o.   MRN: [id number]  Chief Complaint  Patient presents with   neck swelling    X1 week, pt states having swelling near her neck. Pt reports no pain or discharge. No injury    HPI Patient is in today for an office visit.  She has noticed enlargement on her right clavicle near the throat. She denies having pain in this area. No known injury.      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 Donneta Romberg   VIDEO ASSISTED THORACOSCOPY  2005   VIDEO BRONCHOSCOPY N/A 08/06/2019   Procedure: VIDEO BRONCHOSCOPY WITHOUT FLUORO;  Surgeon: Brand Males, MD;  Location: [facility];  Service: Endoscopy;   Laterality: N/A;   WISDOM TOOTH EXTRACTION      Family History  Problem Relation Age of Onset   Hypertension Mother    Stroke Mother    Heart disease Father    Hypertension Father    Cancer Maternal Grandmother 10       breast   Diabetes Maternal Grandmother    Colon cancer Neg Hx    Esophageal cancer Neg Hx    Rectal cancer Neg Hx    Stomach cancer Neg Hx     Social History   Socioeconomic History   Marital status: Married    Spouse name: Not on file   Number of children: 3   Years of education: Not on file   Highest education level: Not on file  Occupational History   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    Employer: [employer]  Tobacco Use   Smoking status: Former    Packs/day: 1.00    Years: 14.00    Total pack years: 14.00    Types: Cigarettes    Quit date: 07/11/1982    Years since quitting: 39.6   Smokeless tobacco: Never  Vaping Use   Vaping Use: Never used  Substance and Sexual Activity   Alcohol use: Yes    Alcohol/week: 0.0 standard drinks of alcohol  Comment: rare   Drug use: No   Sexual activity: Yes    Partners: Male    Birth control/protection: Post-menopausal    Comment: 1st intercourse 67 yo-Fewer than 5 partners  Other Topics Concern   Not on file  Social History Narrative   Exercise-- jazzersize  3x a week    Social Determinants of Health   Financial Resource Strain: Low Risk  (12/24/2020)   Overall Financial Resource Strain (CARDIA)    Difficulty of Paying Living Expenses: Not hard at all  Food Insecurity: No Food Insecurity (12/28/2021)   Hunger Vital Sign    Worried About Running Out of Food in the Last Year: Never true    [place] in the Last Year: Never true  Transportation Needs: No Transportation Needs (12/28/2021)   PRAPARE - Hydrologist (Medical): No    Lack of Transportation (Non-Medical): No  Physical Activity: Sufficiently Active (12/24/2020)   Exercise Vital Sign     Days of Exercise per Week: 6 days    Minutes of Exercise per Session: 40 min  Stress: No Stress Concern Present (12/24/2020)   [place]    Feeling of Stress : Not at all  Social Connections: Moderately Isolated (12/24/2020)   Social Connection and Isolation Panel [NHANES]    Frequency of Communication with Friends and Family: More than three times a week    Frequency of Social Gatherings with Friends and Family: More than three times a week    Attends Religious Services: Never    Marine scientist or Organizations: No    Attends Archivist Meetings: Never    Marital Status: Married  Human resources officer Violence: Not At Risk (12/28/2021)   Humiliation, Afraid, Rape, and Kick questionnaire    Fear of Current or Ex-Partner: No    Emotionally Abused: No    Physically Abused: No    Sexually Abused: No    Outpatient Medications Prior to Visit  Medication Sig Dispense Refill   acetaminophen (TYLENOL) 500 MG tablet Take 500 mg by mouth every 6 (six) hours as needed for moderate pain or headache.     albuterol (VENTOLIN HFA) 108 (90 Base) MCG/ACT inhaler Inhale 1-2 puffs into the lungs every 6 (six) hours as needed for wheezing or shortness of breath.     aspirin EC 81 MG tablet Take 81 mg by mouth daily. Swallow whole.     atorvastatin (LIPITOR) 20 MG tablet TAKE 1 TABLET(20 MG) BY MOUTH DAILY 90 tablet 2   azelastine (ASTELIN) 0.1 % nasal spray Place 1 spray into both nostrils 2 (two) times daily.     denosumab (PROLIA) 60 MG/ML SOSY injection Inject 60 mg into the skin every 6 (six) months.     FIBER PO Take by mouth daily. Either Benefiber or Metamucil     fluticasone (FLONASE) 50 MCG/ACT nasal spray Place 1 spray into both nostrils daily.     gabapentin (NEURONTIN) 300 MG capsule TAKE 1 CAPSULE(300 MG) BY MOUTH THREE TIMES DAILY (Patient taking differently: Take 300 mg by mouth daily.) 90 capsule 3    levocetirizine (XYZAL) 5 MG tablet Take 5 mg by mouth every evening.      montelukast (SINGULAIR) 10 MG tablet Take 10 mg by mouth at bedtime.     spironolactone (ALDACTONE) 25 MG tablet TAKE 1 TABLET(25 MG) BY MOUTH DAILY 90 tablet 3   UNABLE TO FIND Take by mouth  daily. Med Name: ViActiv     valACYclovir (VALTREX) 1000 MG tablet Take 1 tablet (1,000 mg total) by mouth 3 (three) times daily. 30 tablet 5   valACYclovir (VALTREX) 500 MG tablet Take 1 tablet po bid x 5 days as needed for outbreak 30 tablet 1   valsartan (DIOVAN) 160 MG tablet Take 1 tablet (160 mg total) by mouth daily. 90 tablet 3   nitroGLYCERIN (NITROSTAT) 0.4 MG SL tablet Place 1 tablet (0.4 mg total) under the tongue every 5 (five) minutes as needed for chest pain. 90 tablet 3   Facility-Administered Medications Prior to Visit  Medication Dose Route Frequency Provider Last Rate Last Admin   gi cocktail (Maalox,Lidocaine,Donnatal)  30 mL Oral Once Carollee Herter, Kendrick Fries R, DO        No Known Allergies  Review of Systems  Constitutional:  Negative for fever and malaise/fatigue.  HENT:  Negative for congestion.   Eyes:  Negative for blurred vision.  Respiratory:  Negative for shortness of breath.   Cardiovascular:  Negative for chest pain, palpitations and leg swelling.  Gastrointestinal:  Negative for abdominal pain, blood in stool and nausea.  Genitourinary:  Negative for dysuria and frequency.  Musculoskeletal:  Negative for falls and neck pain.  Skin:  Negative for rash.       (+)Right clavicle enlargement near the throat   Neurological:  Negative for dizziness, loss of consciousness and headaches.  Endo/Heme/Allergies:  Negative for environmental allergies.  Psychiatric/Behavioral:  Negative for depression. The patient is not nervous/anxious.        Objective:    Physical Exam Vitals and nursing note reviewed.  Constitutional:      Appearance: Normal appearance.  HENT:     Head: Normocephalic and  atraumatic.     Right Ear: External ear normal.     Left Ear: External ear normal.  Eyes:     Extraocular Movements: Extraocular movements intact.     Pupils: Pupils are equal, round, and reactive to light.  Neck:     Trachea: Trachea normal.     Comments: R clavicle more ant as compared to the L clavicle  Non tender  No errythema  Cardiovascular:     Rate and Rhythm: Normal rate and regular rhythm.     Heart sounds: Normal heart sounds. No murmur heard.    No gallop.  Pulmonary:     Effort: Pulmonary effort is normal. No respiratory distress.     Breath sounds: Normal breath sounds. No wheezing or rales.  Lymphadenopathy:     Cervical: No cervical adenopathy.  Neurological:     Mental Status: She is alert and oriented to person, place, and time.  Psychiatric:        Judgment: Judgment normal.     BP 138/88 (BP Location: Left Arm, Patient Position: Sitting, Cuff Size: Normal)   Pulse 70   Temp 98 F (36.7 C) (Oral)   Resp 18   Ht '5\' 3"'$  (1.6 m)   Wt 128 lb 3.2 oz (58.2 kg)   SpO2 95%   BMI 22.71 kg/m  Wt Readings from Last 3 Encounters:  03/05/22 128 lb 3.2 oz (58.2 kg)  02/08/22 125 lb 12.8 oz (57.1 kg)  11/19/21 124 lb 3.2 oz (56.3 kg)       Assessment & Plan:  Abnormal prominence of clavicle Assessment & Plan: I believe this is normal but will get an xray to confirm to to pt worry  Return to office as needed  Orders: -     DG Clavicle Right; Future    I, Ann Held, DO, personally preformed the services described in this documentation.  All medical record entries made by the scribe were at my direction and in my presence.  I have reviewed the chart and discharge instructions (if applicable) and agree that the record reflects my personal performance and is accurate and complete. 03/05/2022   I,[name],acting as a scribe for Ann Held, DO.,have documented all relevant documentation on the behalf of Ann Held, DO,as  directed by  Ann Held, DO while in the presence of Ann Held, DO.   Ann Held, DO

## 2022-03-05 NOTE — Patient Instructions (Signed)
Go downstairs for xray  I believe this to be normal but we will check an xray

## 2022-03-05 NOTE — Assessment & Plan Note (Signed)
I believe this is normal but will get an xray to confirm to to pt worry  Return to office as needed

## 2022-03-08 ENCOUNTER — Ambulatory Visit: Payer: Medicare PPO | Admitting: Family Medicine

## 2022-03-30 NOTE — Progress Notes (Unsigned)
$'@Patient'U$  ID: [name], female    DOB: [birth year], 67 y.o.   MRN: [id number]  Chief Complaint  Patient presents with   Follow-up    Slight cough persistent.  Smells tend to make pt cough worse.    Referring provider: Ann Held, *  HPI: 67 year old female, former smoker. PMH significant for CAD, HTN, atherosclerosis, bronchiectasis, hx pneumothorax, osteoporosis, uterine fibroids, chronic cough. Patient of Dr. Chase Caller.     Follow-up chronic cough -Repeat gabapentin since August 2022 -Stop Fosamax October 2022 Follow-up lung nodule multiple with largest 8 mm and right lower lobe May 2022 [stable through November 2021]  - dJuly 2023 ->stable -> no futher CT  Covid sprng 2023 - april  Former history of smoking  Previous [facility] pulmonary encounter:  10/29/2021 -  Dr. Chase Caller Chief Complaint  Patient presents with   Follow-up    Pt states she has been doing okay since last visit. States she still has a cough that she believes is worse in the evenings.   [name] 67 y.o. -returns for follow-up.Still hs some cough . IS on gabapentin lower dose '300mg'$  once daily at night. Summer has been tough due to humidity. She is willing to taper the gabapentin to off an dsee. HAd CT - lung nodules are stable. No further followup needed    03/31/2022 - interim hx  Presents today for her to 60-monthfollow-up/ chronic cough. Patient tapered off gabapentin at the end of January 2024.   Cough is noticeably worse sine being off gabapentin, she tapered off end of January. She has been off gabapentin for 1 week. Husband has noticed she is coughing more since stopping medication.  Cough is dry, no chest congestion. Denies chest tightness, wheezing or shortness of breath. She exercises Use albuterol 30 min prior to exercise class, if she forgets to take SABA she will cough during class. She takes montelukast and xyzal at bedtime.    Dr KLorenza CambridgeReflux Symptom Index (> 13-15 suggestive of  LPR cough)  06/25/2019  10/23/2019  11/13/2020 gabepentin 02/05/2021  06/19/2021  03/31/2022   Hoarseness of problem with voice 3 2 0 0 1 0  Clearing  Of Throat 1 2 0 '2 1 2  '$ Excess throat mucus or feeling of post nasal drip 1 1 0 0 0 0  Difficulty swallowing food, liquid or tablets 0 0 0 0 0 0  Cough after eating or lying down 2 0 0 0 1 1  Breathing difficulties or choking episodes 3 2 0 0 0 0  Troublesome or annoying cough '5 4 2 3 2 4  '$ Sensation of something sticking in throat or lump in throat 2 0 0 1 1 0  Heartburn, chest pain, indigestion, or stomach acid coming up 1 0 0 1 0 0  TOTAL '17 11 2 7 6 7     '$ No Known Allergies  Immunization History  Administered Date(s) Administered   DTaP 02/03/2019   Fluad Quad(high Dose 65+) 11/03/2018, 11/06/2020, 11/19/2021   Influenza Whole 11/30/2006, 12/13/2007, 01/01/2009, 12/31/2009   Influenza, High Dose Seasonal PF 04/19/2016, 04/12/2017, 03/14/2018, 04/30/2019   Influenza,inj,Quad PF,6+ Mos 11/27/2017   Influenza-Unspecified 10/24/2013, 11/28/2016, 11/23/2019   PFIZER(Purple Top)SARS-COV-2 Vaccination 03/31/2019, 04/25/2019, 11/23/2019, 09/01/2020, 12/03/2020   Pfizer Covid-19 Vaccine Bivalent Booster 171yr& up 11/23/2021   Pneumococcal Conjugate-13 11/03/2018   Pneumococcal Polysaccharide-23 04/12/2017, 03/14/2018, 04/30/2019, 11/05/2019, 11/04/2020   Respiratory Syncytial Virus Vaccine,Recomb Aduvanted(Arexvy) 12/07/2021   Td 08/28/2002, 04/18/2008  Tdap 01/24/2019   Zoster Recombinat (Shingrix) 11/28/2016, 02/04/2017   Zoster, Live 08/20/2013    Past Medical History:  Diagnosis Date   Allergy    Cataract    Chronic cough 01/24/2019   Costochondritis 01/24/2019   Epigastric pain 07/29/2016   FIBROIDS, UTERUS 01/18/2007   Qualifier: Diagnosis of  By: Jerold Coombe     H/O Bell's palsy 05/2010   HYPERTENSION 03/24/2007   Qualifier: Diagnosis of  By: Ronnald Ramp CMA, Chemira     MENORRHAGIA, PERIMENOPAUSAL 01/13/2007    Qualifier: Diagnosis of  By: Jerold Coombe     MOLE 05/07/2010   Qualifier: Diagnosis of  By: Jerold Coombe     Osteopenia 08/23/2014   RUQ pain 07/29/2016   Spontaneous pneumothorax 2005   R, s/p tube, then VATS   Spontaneous tension pneumothorax 03/24/2007   Qualifier: Diagnosis of  By: Jerold Coombe      Tobacco History: Social History   Tobacco Use  Smoking Status Former   Packs/day: 1.00   Years: 14.00   Total pack years: 14.00   Types: Cigarettes   Quit date: 07/11/1982   Years since quitting: 39.7  Smokeless Tobacco Never   Counseling given: Not Answered   Outpatient Medications Prior to Visit  Medication Sig Dispense Refill   acetaminophen (TYLENOL) 500 MG tablet Take 500 mg by mouth every 6 (six) hours as needed for moderate pain or headache.     albuterol (VENTOLIN HFA) 108 (90 Base) MCG/ACT inhaler Inhale 1-2 puffs into the lungs every 6 (six) hours as needed for wheezing or shortness of breath.     aspirin EC 81 MG tablet Take 81 mg by mouth daily. Swallow whole.     atorvastatin (LIPITOR) 20 MG tablet TAKE 1 TABLET(20 MG) BY MOUTH DAILY 90 tablet 2   azelastine (ASTELIN) 0.1 % nasal spray Place 1 spray into both nostrils 2 (two) times daily.     denosumab (PROLIA) 60 MG/ML SOSY injection Inject 60 mg into the skin every 6 (six) months.     FIBER PO Take by mouth daily. Either Benefiber or Metamucil     fluticasone (FLONASE) 50 MCG/ACT nasal spray Place 1 spray into both nostrils daily.     levocetirizine (XYZAL) 5 MG tablet Take 5 mg by mouth every evening.      montelukast (SINGULAIR) 10 MG tablet Take 10 mg by mouth at bedtime.     spironolactone (ALDACTONE) 25 MG tablet TAKE 1 TABLET(25 MG) BY MOUTH DAILY 90 tablet 3   UNABLE TO FIND Take by mouth daily. Med Name: ViActiv     valACYclovir (VALTREX) 1000 MG tablet Take 1 tablet (1,000 mg total) by mouth 3 (three) times daily. 30 tablet 5   valACYclovir (VALTREX) 500 MG tablet Take 1 tablet po bid x 5  days as needed for outbreak 30 tablet 1   valsartan (DIOVAN) 160 MG tablet Take 1 tablet (160 mg total) by mouth daily. 90 tablet 3   nitroGLYCERIN (NITROSTAT) 0.4 MG SL tablet Place 1 tablet (0.4 mg total) under the tongue every 5 (five) minutes as needed for chest pain. 90 tablet 3   gabapentin (NEURONTIN) 300 MG capsule TAKE 1 CAPSULE(300 MG) BY MOUTH THREE TIMES DAILY (Patient not taking: Reported on 03/31/2022) 90 capsule 3   Facility-Administered Medications Prior to Visit  Medication Dose Route Frequency Provider Last Rate Last Admin   gi cocktail (Maalox,Lidocaine,Donnatal)  30 mL Oral Once Carollee Herter, Alferd Apa, DO  Review of Systems  Review of Systems  Constitutional: Negative.   HENT: Negative.  Negative for congestion and postnasal drip.   Respiratory:  Positive for cough. Negative for chest tightness, shortness of breath and wheezing.   Cardiovascular: Negative.     Physical Exam  BP (!) 140/76 (BP Location: Left Arm, Patient Position: Sitting, Cuff Size: Normal)   Pulse 69   Temp 97.7 F (36.5 C) (Oral)   Ht '5\' 3"'$  (1.6 m)   Wt 130 lb (59 kg)   SpO2 98%   BMI 23.03 kg/m  Physical Exam Constitutional:      Appearance: Normal appearance.  HENT:     Head: Normocephalic and atraumatic.  Cardiovascular:     Rate and Rhythm: Normal rate and regular rhythm.  Pulmonary:     Effort: Pulmonary effort is normal.     Breath sounds: Normal breath sounds.     Comments: CTA Musculoskeletal:        General: Normal range of motion.  Skin:    General: Skin is warm and dry.  Neurological:     General: No focal deficit present.     Mental Status: She is alert and oriented to person, place, and time. Mental status is at baseline.  Psychiatric:        Mood and Affect: Mood normal.        Behavior: Behavior normal.        Thought Content: Thought content normal.        Judgment: Judgment normal.      Lab Results:  CBC    Component Value Date/Time   WBC 4.4  11/19/2021 0941   RBC 4.24 11/19/2021 0941   HGB 13.0 11/19/2021 0941   HCT 37.7 11/19/2021 0941   PLT 181.0 11/19/2021 0941   MCV 88.9 11/19/2021 0941   MCH 31.0 01/24/2021 2211   MCHC 34.6 11/19/2021 0941   RDW 12.5 11/19/2021 0941   LYMPHSABS 1.5 11/19/2021 0941   MONOABS 0.4 11/19/2021 0941   EOSABS 0.1 11/19/2021 0941   BASOSABS 0.1 11/19/2021 0941    BMET    Component Value Date/Time   NA 138 11/19/2021 0941   K 3.7 11/19/2021 0941   CL 102 11/19/2021 0941   CO2 30 11/19/2021 0941   GLUCOSE 92 11/19/2021 0941   BUN 12 11/19/2021 0941   CREATININE 0.62 11/19/2021 0941   CREATININE 0.53 11/05/2019 1353   CALCIUM 8.7 11/19/2021 0941   GFRNONAA >60 01/24/2021 2211   GFRAA 96 04/18/2008 0000    BNP No results found for: "BNP"  ProBNP No results found for: "PROBNP"  Imaging: DG Clavicle Right  Result Date: 03/07/2022 CLINICAL DATA:  lump palpated on R clavicle-- prox EXAM: RIGHT CLAVICLE - 2+ VIEWS COMPARISON:  Chest radiograph 01/24/2021 FINDINGS: There is no evidence of acute fracture. Alignment is normal. There is moderate osteoarthritis of the acromioclavicular joint. Mild degenerative changes of the sternoclavicular joint. Postsurgical changes noted in the right lung apex. IMPRESSION: No acute osseous abnormality. 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 Electronically Signed   By: Maurine Simmering M.D.   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 She was tapered off medication end of January 2024 and cough returned. FENO was normal today. Recommend resuming Gabapentin '300mg'$  at night Monday, Wednesday and Friday. Continue Albuterol 2 puffs every 4-6 hours as needed for cough   Orders: FENO re: cough  Follow-up: 3 months with Dr. Chase Caller   Lung nodules - Lung nodules stable November 2021 through May 2022 [largest nodule 8 mm right lower  lobe] - CT scan chest July  2023 stable nodules - No further CT indicated   Martyn Ehrich, NP 03/31/2022

## 2022-03-31 ENCOUNTER — Ambulatory Visit: Payer: Medicare PPO | Admitting: Primary Care

## 2022-03-31 ENCOUNTER — Encounter: Payer: Self-pay | Admitting: Primary Care

## 2022-03-31 ENCOUNTER — Telehealth: Payer: Self-pay | Admitting: Primary Care

## 2022-03-31 VITALS — BP 140/76 | HR 69 | Temp 97.7°F | Ht 63.0 in | Wt 130.0 lb

## 2022-03-31 DIAGNOSIS — R918 Other nonspecific abnormal finding of lung field: Secondary | ICD-10-CM

## 2022-03-31 DIAGNOSIS — R053 Chronic cough: Secondary | ICD-10-CM | POA: Diagnosis not present

## 2022-03-31 LAB — POCT EXHALED NITRIC OXIDE: FeNO level (ppb): 6

## 2022-03-31 MED ORDER — GABAPENTIN 300 MG PO CAPS: ORAL_CAPSULE | ORAL | 0 refills | Status: DC

## 2022-03-31 NOTE — Assessment & Plan Note (Signed)
-   Symptoms consistent with cough neuropathy, improved with gabapentin. She was tapered off medication end of January 2024 and cough returned. FENO was normal today. Recommend resuming Gabapentin '300mg'$  at night Monday, Wednesday and Friday. Continue Albuterol 2 puffs every 4-6 hours as needed for cough   Orders: FENO re: cough   Follow-up: 3 months with Dr. Chase [name]

## 2022-03-31 NOTE — Patient Instructions (Addendum)
Recommendations: Resume Gabapentin '300mg'$  at night Monday, Wednesday and Friday Continue Albuterol 2 puffs every 4-6 hours as needed for cough   Orders: FENO re: cough   Follow-up: 3 months with Dr. Chase Caller    Cough, Adult A cough helps to clear your throat and lungs. A cough may be a sign of an illness or another medical condition. An acute cough may only last 2-3 weeks, while a chronic cough may last 8 or more weeks. Many things can cause a cough. They include: Germs (viruses or bacteria) that attack the airway. Breathing in things that bother (irritate) your lungs. Allergies. Asthma. Mucus that runs down the back of your throat (postnasal drip). Smoking. Acid backing up from the stomach into the tube that moves food from the mouth to the stomach (gastroesophageal reflux). Some medicines. Lung problems. Other medical conditions, such as heart failure or a blood clot in the lung (pulmonary embolism). Follow these instructions at home: Medicines Take over-the-counter and prescription medicines only as told by your doctor. Talk with your doctor before you take medicines that stop a cough (cough suppressants). Lifestyle  Do not smoke, and try not to be around smoke. Do not use any products that contain nicotine or tobacco, such as cigarettes, e-cigarettes, and chewing tobacco. If you need help quitting, ask your doctor. Drink enough fluid to keep your pee (urine) pale yellow. Avoid caffeine. Do not drink alcohol if your doctor tells you not to drink. General instructions  Watch for any changes in your cough. Tell your doctor about them. Always cover your mouth when you cough. Stay away from things that make you cough, such as perfume, candles, campfire smoke, or cleaning products. If the air is dry, use a cool mist vaporizer or humidifier in your home. If your cough is worse at night, try using extra pillows to raise your head up higher while you sleep. Rest as needed. Keep  all follow-up visits as told by your doctor. This is important. Contact a doctor if: You have new symptoms. You cough up pus. Your cough does not get better after 2-3 weeks, or your cough gets worse. Cough medicine does not help your cough and you are not sleeping well. You have pain that gets worse or pain that is not helped with medicine. You have a fever. You are losing weight and you do not know why. You have night sweats. Get help right away if: You cough up blood. You have trouble breathing. Your heartbeat is very fast. These symptoms may be an emergency. Do not wait to see if the symptoms will go away. Get medical help right away. Call your local emergency services (911 in the [place] Do not drive yourself to the hospital. Summary A cough helps to clear your throat and lungs. Many things can cause a cough. Take over-the-counter and prescription medicines only as told by your doctor. Always cover your mouth when you cough. Contact a doctor if you have new symptoms or you have a cough that does not get better or gets worse. This information is not intended to replace advice given to you by your health care provider. Make sure you discuss any questions you have with your health care provider. Document Revised: 03/30/2019 Document Reviewed: 02/27/2018 Elsevier Patient Education  [place].

## 2022-03-31 NOTE — Assessment & Plan Note (Signed)
-   Lung nodules stable November 2021 through May 2022 [largest nodule 8 mm right lower lobe] - CT scan chest July  2023 stable nodules - No further CT indicated

## 2022-03-31 NOTE — Telephone Encounter (Signed)
Cough is noticeably worse sine being off gabapentin, she tapered off end of January. She has been off gabapentin for 1 week. Husband has noticed she is coughing more since stopping. If ok with you I was going to start back '300mg'$  at night Monday, Wednesday, Friday. If needed we can go back to daily.

## 2022-04-01 NOTE — Telephone Encounter (Signed)
I believe I sent in Rx yesterday, Gabapentin '300mg'$  at bedtime Monday, Wednesday and Friday

## 2022-04-01 NOTE — Telephone Encounter (Signed)
Order was placed yesterday, my apologies. Notified pt that order had been called in. Nothing further needed at this time.

## 2022-04-01 NOTE — Telephone Encounter (Signed)
This is fine 

## 2022-04-01 NOTE — Telephone Encounter (Signed)
Spoke with pt who states understanding. Beth would like to send in medication or do we need to do it. Preferred pharmacy in [place] on [address].

## 2022-05-20 ENCOUNTER — Ambulatory Visit: Payer: Medicare PPO | Admitting: Family Medicine

## 2022-05-20 ENCOUNTER — Encounter: Payer: Self-pay | Admitting: Family Medicine

## 2022-05-20 VITALS — BP 126/80 | HR 57 | Temp 97.8°F | Resp 16 | Ht 63.0 in | Wt 126.0 lb

## 2022-05-20 DIAGNOSIS — I10 Essential (primary) hypertension: Secondary | ICD-10-CM | POA: Diagnosis not present

## 2022-05-20 DIAGNOSIS — I25119 Atherosclerotic heart disease of native coronary artery with unspecified angina pectoris: Secondary | ICD-10-CM

## 2022-05-20 DIAGNOSIS — J479 Bronchiectasis, uncomplicated: Secondary | ICD-10-CM | POA: Diagnosis not present

## 2022-05-20 DIAGNOSIS — Z0000 Encounter for general adult medical examination without abnormal findings: Secondary | ICD-10-CM

## 2022-05-20 DIAGNOSIS — E785 Hyperlipidemia, unspecified: Secondary | ICD-10-CM

## 2022-05-20 DIAGNOSIS — I700 Atherosclerosis of aorta: Secondary | ICD-10-CM

## 2022-05-20 LAB — CBC WITH DIFFERENTIAL/PLATELET
Basophils Absolute: 0 10*3/uL (ref 0.0–0.1)
Basophils Relative: 1.3 % (ref 0.0–3.0)
Eosinophils Absolute: 0.2 10*3/uL (ref 0.0–0.7)
Eosinophils Relative: 5.2 % — ABNORMAL HIGH (ref 0.0–5.0)
HCT: 35.2 % — ABNORMAL LOW (ref 36.0–46.0)
Hemoglobin: 12.4 g/dL (ref 12.0–15.0)
Lymphocytes Relative: 39.9 % (ref 12.0–46.0)
Lymphs Abs: 1.4 10*3/uL (ref 0.7–4.0)
MCHC: 35.2 g/dL (ref 30.0–36.0)
MCV: 87.4 fl (ref 78.0–100.0)
Monocytes Absolute: 0.3 10*3/uL (ref 0.1–1.0)
Monocytes Relative: 8.4 % (ref 3.0–12.0)
Neutro Abs: 1.6 10*3/uL (ref 1.4–7.7)
Neutrophils Relative %: 45.2 % (ref 43.0–77.0)
Platelets: 176 10*3/uL (ref 150.0–400.0)
RBC: 4.02 Mil/uL (ref 3.87–5.11)
RDW: 12.2 % (ref 11.5–15.5)
WBC: 3.5 10*3/uL — ABNORMAL LOW (ref 4.0–10.5)

## 2022-05-20 LAB — LIPID PANEL
Cholesterol: 128 mg/dL (ref 0–200)
HDL: 60.3 mg/dL (ref >39.00)
LDL Cholesterol: 52 mg/dL (ref 0–99)
NonHDL: 67.65
Total CHOL/HDL Ratio: 2
Triglycerides: 78 mg/dL (ref 0.0–149.0)
VLDL: 15.6 mg/dL (ref 0.0–40.0)

## 2022-05-20 LAB — COMPREHENSIVE METABOLIC PANEL
ALT: 18 U/L (ref 0–35)
AST: 15 U/L (ref 0–37)
Albumin: 4.2 g/dL (ref 3.5–5.2)
Alkaline Phosphatase: 60 U/L (ref 39–117)
BUN: 10 mg/dL (ref 6–23)
CO2: 29 mEq/L (ref 19–32)
Calcium: 8.4 mg/dL (ref 8.4–10.5)
Chloride: 101 mEq/L (ref 96–112)
Creatinine, Ser: 0.59 mg/dL (ref 0.40–1.20)
GFR: 92.14 mL/min (ref >60.00)
Glucose, Bld: 91 mg/dL (ref 70–99)
Potassium: 3.7 mEq/L (ref 3.5–5.1)
Sodium: 136 mEq/L (ref 135–145)
Total Bilirubin: 0.6 mg/dL (ref 0.2–1.2)
Total Protein: 6.4 g/dL (ref 6.0–8.3)

## 2022-05-20 NOTE — Assessment & Plan Note (Signed)
On statin.  Check labs. 

## 2022-05-20 NOTE — Progress Notes (Signed)
Subjective:   By signing my name below, I, [name], attest that this documentation has been prepared under the direction and in the presence of Ann Held, DO. 05/20/2022   Patient ID: [name], female    DOB: 08-18-1955, 67 y.o.   MRN: [id number]  Chief Complaint  Patient presents with   Follow-up    HPI Patient is in today for a follow up visit.   She complains of congestion. Her allergies are flaring up and she thinks it is contributing to her symptoms. She is taking OTC Singulair to help manage her symptoms.    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 Donneta Romberg   VIDEO ASSISTED THORACOSCOPY  2005   VIDEO BRONCHOSCOPY N/A 08/06/2019   Procedure: VIDEO BRONCHOSCOPY WITHOUT FLUORO;  Surgeon: Brand Males, MD;  Location: [facility];  Service: Endoscopy;  Laterality: N/A;   WISDOM TOOTH EXTRACTION      Family  History  Problem Relation Age of Onset   Hypertension Mother    Stroke Mother    Heart disease Father    Hypertension Father    Cancer Maternal Grandmother 31       breast   Diabetes Maternal Grandmother    Colon cancer Neg Hx    Esophageal cancer Neg Hx    Rectal cancer Neg Hx    Stomach cancer Neg Hx     Social History   Socioeconomic History   Marital status: Married    Spouse name: Not on file   Number of children: 3   Years of education: Not on file   Highest education level: 12th grade  Occupational History   Occupation: southern Art therapist    Employer: [employer]  Tobacco Use   Smoking status: Former    Packs/day: 1.00    Years: 14.00    Additional pack years: 0.00    Total pack years: 14.00    Types: Cigarettes    Quit date: 07/11/1982    Years since quitting: 39.8   Smokeless tobacco: Never  Vaping Use   Vaping Use: Never used  Substance and Sexual Activity   Alcohol use: Yes    Alcohol/week: 0.0 standard drinks of alcohol    Comment: rare   Drug use: No  Sexual activity: Yes    Partners: Male    Birth control/protection: Post-menopausal    Comment: 1st intercourse 67 yo-Fewer than 5 partners  Other Topics Concern   Not on file  Social History Narrative   Exercise-- jazzersize  3x a week    Social Determinants of Health   Financial Resource Strain: Low Risk  (05/13/2022)   Overall Financial Resource Strain (CARDIA)    Difficulty of Paying Living Expenses: Not hard at all  Food Insecurity: No Food Insecurity (05/13/2022)   Hunger Vital Sign    Worried About Running Out of Food in the Last Year: Never true    Ran Out of Food in the Last Year: Never true  Transportation Needs: No Transportation Needs (05/13/2022)   PRAPARE - Hydrologist (Medical): No    Lack of Transportation (Non-Medical): No  Physical Activity: Sufficiently Active (05/13/2022)   Exercise Vital Sign    Days of Exercise per  Week: 6 days    Minutes of Exercise per Session: 60 min  Stress: No Stress Concern Present (05/13/2022)   [place]    Feeling of Stress : Only a little  Social Connections: Socially Integrated (05/13/2022)   Social Connection and Isolation Panel [NHANES]    Frequency of Communication with Friends and Family: Twice a week    Frequency of Social Gatherings with Friends and Family: Once a week    Attends Religious Services: 1 to 4 times per year    Active Member of [employer] or Organizations: Yes    Attends Music therapist: More than 4 times per year    Marital Status: Married  Human resources officer Violence: Not At Risk (12/28/2021)   Humiliation, Afraid, Rape, and Kick questionnaire    Fear of Current or Ex-Partner: No    Emotionally Abused: No    Physically Abused: No    Sexually Abused: No    Outpatient Medications Prior to Visit  Medication Sig Dispense Refill   acetaminophen (TYLENOL) 500 MG tablet Take 500 mg by mouth every 6 (six) hours as needed for moderate pain or headache.     albuterol (VENTOLIN HFA) 108 (90 Base) MCG/ACT inhaler Inhale 1-2 puffs into the lungs every 6 (six) hours as needed for wheezing or shortness of breath.     aspirin EC 81 MG tablet Take 81 mg by mouth daily. Swallow whole.     atorvastatin (LIPITOR) 20 MG tablet TAKE 1 TABLET(20 MG) BY MOUTH DAILY 90 tablet 2   azelastine (ASTELIN) 0.1 % nasal spray Place 1 spray into both nostrils 2 (two) times daily.     denosumab (PROLIA) 60 MG/ML SOSY injection Inject 60 mg into the skin every 6 (six) months.     FIBER PO Take by mouth daily. Either Benefiber or Metamucil     fluticasone (FLONASE) 50 MCG/ACT nasal spray Place 1 spray into both nostrils daily.     gabapentin (NEURONTIN) 300 MG capsule Take 1 capsule by mouth Monday, Wednesday and Friday 30 capsule 0   levocetirizine (XYZAL) 5 MG tablet Take 5 mg by mouth every evening.       montelukast (SINGULAIR) 10 MG tablet Take 10 mg by mouth at bedtime.     spironolactone (ALDACTONE) 25 MG tablet TAKE 1 TABLET(25 MG) BY MOUTH DAILY 90 tablet 3   UNABLE TO FIND Take by mouth daily. Med Name: ViActiv     valACYclovir (VALTREX) 1000 MG tablet Take  1 tablet (1,000 mg total) by mouth 3 (three) times daily. 30 tablet 5   valACYclovir (VALTREX) 500 MG tablet Take 1 tablet po bid x 5 days as needed for outbreak 30 tablet 1   valsartan (DIOVAN) 160 MG tablet Take 1 tablet (160 mg total) by mouth daily. 90 tablet 3   nitroGLYCERIN (NITROSTAT) 0.4 MG SL tablet Place 1 tablet (0.4 mg total) under the tongue every 5 (five) minutes as needed for chest pain. 90 tablet 3   Facility-Administered Medications Prior to Visit  Medication Dose Route Frequency Provider Last Rate Last Admin   gi cocktail (Maalox,Lidocaine,Donnatal)  30 mL Oral Once Carollee Herter, Kendrick Fries R, DO        No Known Allergies  Review of Systems  Constitutional:  Negative for fever and malaise/fatigue.  HENT:  Positive for congestion.   Eyes:  Negative for blurred vision.  Respiratory:  Negative for shortness of breath.   Cardiovascular:  Negative for chest pain, palpitations and leg swelling.  Gastrointestinal:  Negative for abdominal pain, blood in stool and nausea.  Genitourinary:  Negative for dysuria and frequency.  Musculoskeletal:  Negative for falls.  Skin:  Negative for rash.  Neurological:  Negative for dizziness, loss of consciousness and headaches.  Endo/Heme/Allergies:  Negative for environmental allergies.  Psychiatric/Behavioral:  Negative for depression. The patient is not nervous/anxious.        Objective:    Physical Exam Vitals and nursing note reviewed.  Constitutional:      General: She is not in acute distress.    Appearance: Normal appearance. She is not ill-appearing.  HENT:     Head: Normocephalic and atraumatic.     Right Ear: External ear normal.     Left Ear: External ear normal.   Eyes:     Extraocular Movements: Extraocular movements intact.     Pupils: Pupils are equal, round, and reactive to light.  Cardiovascular:     Rate and Rhythm: Normal rate and regular rhythm.     Heart sounds: Normal heart sounds. No murmur heard.    No gallop.  Pulmonary:     Effort: Pulmonary effort is normal. No respiratory distress.     Breath sounds: Normal breath sounds. No wheezing or rales.  Skin:    General: Skin is warm and dry.  Neurological:     Mental Status: She is alert and oriented to person, place, and time.  Psychiatric:        Judgment: Judgment normal.     BP 126/80 (BP Location: Left Arm, Patient Position: Sitting, Cuff Size: Normal)   Pulse (!) 57   Temp 97.8 F (36.6 C) (Oral)   Resp 16   Ht 5\' 3"  (1.6 m)   Wt 126 lb (57.2 kg)   SpO2 98%   BMI 22.32 kg/m  Wt Readings from Last 3 Encounters:  05/20/22 126 lb (57.2 kg)  03/31/22 130 lb (59 kg)  03/05/22 128 lb 3.2 oz (58.2 kg)       Assessment & Plan:  Primary hypertension Assessment & Plan: Well controlled, no changes to meds. Encouraged heart healthy diet such as the DASH diet and exercise as tolerated.    Orders: -     CBC with Differential/Platelet -     Comprehensive metabolic panel -     Lipid panel  Preventative health care Assessment & Plan: Ghm utd Check labs  Health Maintenance  Topic Date Due   COVID-19 Vaccine (7 - 2023-24 season) 01/18/2022  MAMMOGRAM  07/30/2022   Medicare Annual Wellness (AWV)  12/29/2022   DEXA SCAN  07/30/2023   COLONOSCOPY (Pts 45-42yrs Insurance coverage will need to be confirmed)  10/27/2027   DTaP/Tdap/Td (5 - Td or Tdap) 02/02/2029   Pneumonia Vaccine 78+ Years old  Completed   INFLUENZA VACCINE  Completed   Hepatitis C Screening  Completed   Zoster Vaccines- Shingrix  Completed   HPV VACCINES  Aged Out     Orders: -     CBC with Differential/Platelet -     Comprehensive metabolic panel -     Lipid panel  Hyperlipidemia, unspecified  hyperlipidemia type Assessment & Plan: Tolerating statin, encouraged heart healthy diet, avoid trans fats, minimize simple carbs and saturated fats. Increase exercise as tolerated   Orders: -     CBC with Differential/Platelet -     Comprehensive metabolic panel -     Lipid panel  Aortic atherosclerosis (HCC) Assessment & Plan: On statin  Check labs    Bronchiectasis without complication ([place])  Coronary artery disease involving native coronary artery of native heart with angina pectoris [facility]) Assessment & Plan: On statin        I, Ann Held, DO, personally preformed the services described in this documentation.  All medical record entries made by the scribe were at my direction and in my presence.  I have reviewed the chart and discharge instructions (if applicable) and agree that the record reflects my personal performance and is accurate and complete. 05/20/2022   I,[name],acting as a scribe for Ann Held, DO.,have documented all relevant documentation on the behalf of Ann Held, DO,as directed by  Ann Held, DO while in the presence of Ann Held, DO.   Ann Held, DO

## 2022-05-20 NOTE — Assessment & Plan Note (Signed)
Tolerating statin, encouraged heart healthy diet, avoid trans fats, minimize simple carbs and saturated fats. Increase exercise as tolerated 

## 2022-05-20 NOTE — Assessment & Plan Note (Signed)
Ghm utd Check labs  Health Maintenance  Topic Date Due   COVID-19 Vaccine (7 - 2023-24 season) 01/18/2022   MAMMOGRAM  07/30/2022   Medicare Annual Wellness (AWV)  12/29/2022   DEXA SCAN  07/30/2023   COLONOSCOPY (Pts 45-31yrs Insurance coverage will need to be confirmed)  10/27/2027   DTaP/Tdap/Td (5 - Td or Tdap) 02/02/2029   Pneumonia Vaccine 57+ Years old  Completed   INFLUENZA VACCINE  Completed   Hepatitis C Screening  Completed   Zoster Vaccines- Shingrix  Completed   HPV VACCINES  Aged Out

## 2022-05-20 NOTE — Assessment & Plan Note (Signed)
On statin.

## 2022-05-20 NOTE — Assessment & Plan Note (Signed)
Well controlled, no changes to meds. Encouraged heart healthy diet such as the DASH diet and exercise as tolerated.  °

## 2022-05-20 NOTE — Patient Instructions (Signed)

## 2022-06-07 ENCOUNTER — Other Ambulatory Visit: Payer: Self-pay | Admitting: Primary Care

## 2022-07-13 ENCOUNTER — Ambulatory Visit: Payer: Medicare PPO | Admitting: Internal Medicine

## 2022-07-13 ENCOUNTER — Encounter: Payer: Self-pay | Admitting: Internal Medicine

## 2022-07-13 VITALS — BP 120/80 | HR 79 | Ht 63.0 in | Wt 127.4 lb

## 2022-07-13 DIAGNOSIS — J387 Other diseases of larynx: Secondary | ICD-10-CM

## 2022-07-13 DIAGNOSIS — R053 Chronic cough: Secondary | ICD-10-CM

## 2022-07-13 NOTE — Addendum Note (Signed)
Addended by: Hedda Slade on: 07/13/2022 04:17 PM   Modules accepted: Orders

## 2022-07-13 NOTE — Progress Notes (Signed)
OV 06/25/2019  Subjective:  Patient ID: [name], female , DOB: 06-Aug-1955 , age 67 y.o. , MRN: [id number] , ADDRESS: [address] [place] [facility] [address]   06/25/2019 -   Chief Complaint  Patient presents with   Consult    Pt is here due to having a persistent dry cough for years. Pt also is being treated for a pneumothorax. States her cough could be bad at any time of the day.     HPI [name] 67 y.o. -has been referred by Melissa. [place] [name] allergist for evaluation of chronic cough.  Patient tells me that she has had insidious onset of chronic cough since the 1990s.  Since then it is persistent and is at the same level.  Cough is described as moderate.  Mostly dry except when she has a flareup with an infection or bronchitis.  In 2005 she had a spontaneous right pneumothorax after respiratory flareup.  This was treated initially by tube thoracostomy and then by pleurodesis by Melissa. Lavinia [name] according to the patient's history.  Since then she has not had a recurrence of pneumothorax but does continue to have chronic cough.  The cough is definitely made worse by talking a lot or by inhaling fumes such as perfume or any smoke exposure.  She says the cough during this episodes can be so severe that she almost has a whooping sound to it.  This not necessarily any associated shortness of breath.  The cough is mostly dry.  The cough does not wake her up at night.  Severity index is listed below.  Talking does make the cough worse.  Cough relevant history  -Hypertension: She is not on ACE inhibitor but is on ARB valsartan.  This is not associated with cough  -Allergy history: She did have onset of spring allergies in 1990s.  During this time allergy testing showed positive allergies to a few things including mold and grass.  She says repeat allergy testing some years ago by [name] revealed no allergies.  She is not on allergy shots but she is on antihistamines and nasal steroids.  -Sinus  history: She is not had a CT scan of the sinus.  She does not have a significant postnasal drip but she is also on antihistamines.  She has never been evaluated by ENT.  Acid reflux history: She denies any strong acid reflux.  She states in 20 years she is probably taken a few bottles of Tums in total.  She is never seen GI.  However a GI referral is under consideration by [name]  -Chronic refractory cough: She does clear her throat.  - Verbal output hx: she is considered a chatty person by her family. Job was a Psychologist, counselling at [employer].  During this time she had to talk a lot.  -Imaging: Chest x-ray 2020 personally visualized and is clear.    CXR 01/23/2019  IMPRESSION: Stable postoperative chest with mild pleural thickening bilaterally. No evidence of recurrent pneumothorax or acute cardiopulmonary process.     Electronically Signed   By: [name] M.D.   On: 01/23/2019 14:44   ROS - per HPI  Results for [name] (MRN [id number]) as of 06/25/2019 09:38  Ref. Range 08/20/2013 09:35 02/21/2014 10:40 08/23/2014 10:30 09/23/2015 16:35 07/29/2016 16:46 10/18/2016 10:06 10/21/2017 10:05 11/03/2018 13:56  Eosinophils Absolute Latest Ref Range: 0.0 - 0.7 K/uL 0.2 0.1 0.1 0.1 0.1 0.1 0.1 0.1   Results for  [name] (MRN [id number]) as of 06/25/2019 09:38  Ref. Range 03/22/2017 13:54  Influenza A, POC Latest Ref Range: Negative  Positive (A)  Influenza B, POC Latest Ref Range: Negative  Negative      07/04/2019 ov - [name] Patient presents today for 10-day follow-up to review CT imaging. She has had a chronic cough for a long times. She takes montelukast and Xyzal. Uses maintenance inahler Advair hfa 115-21 twice daily and prn albuterol hfa prior to exercise. She states that nothing helps her cough. Triggers include strong scents, tart wine and spicy food. She has post nasal drip symptoms. She gets out of breath with speaking. She has no issues with 45 min  exercise class. No significant shortness of breath. No obvious reflux symptoms. She has a known hypodensity liver stable 2014. She does have some epigastric pain. She has an upcoming apt with GI but may rescheduled until after bronchscopy.   Imaging:  CT showed scattered mild cylindrical bronchiectasis mid lungs with associated mild patchy tree-in-bud opacities and mild regions of subpleural scarring.  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  Few scattered solid pulmonary nodules in both lungs largest measuring 6 mm right lower lobe. 3. Two-vessel coronary atherosclerosis. 4. Aortic Atherosclerosis (ICD10-I70.0) and Emphysema   06/30/19 CT maxillofacial - Negative for sinusitis    OV 08/20/2019  Subjective:  Patient ID: [name], female , DOB: 1955-09-12 , age 79 y.o. , MRN: [id number] , ADDRESS: [address] [place] [facility] [address]   08/20/2019 -   Chief Complaint  Patient presents with   Follow-up    still has a dry cough     HPI [name] 67 y.o. -video visit for chronic cough.  This is still follow-up with bronchoscopy results.  She had bronchoscopy mid June 2021.  She had normal airway exam.  Right middle lobe lavage shows neutrophilia.  She is growing Candida albicans.  She is not immunocompromise per her history.  On this video visit she is joined by her husband.  Husband and she report that she coughs early in the morning.  She has seen [name] of GI who is advised expectant approach.  Certainly he will offer more testing according to history if her symptoms persist.  Medication review shows she is on Advair MDI and also she is taking fish oil.  She says she been taking fish oil for many years.  She has never been on gabapentin but is willing to try this in the future.  She is aware of the concept of neurogenic cough.    [name] Reflux Symptom Index (> 13-15 suggestive of LPR cough)  06/25/2019   Hoarseness of problem with voice 3   Clearing  Of Throat 1  Excess throat mucus or feeling of post nasal drip 1  Difficulty swallowing food, liquid or tablets 0  Cough after eating or lying down 2  Breathing difficulties or choking episodes 3  Troublesome or annoying cough 5  Sensation of something sticking in throat or lump in throat 2  Heartburn, chest pain, indigestion, or stomach acid coming up 1  TOTAL 17     Results for [name], [name] (MRN [id number]) as of 08/20/2019 09:24  Ref. Range 08/06/2019 07:28  Monocyte-Macrophage-Serous Fluid Latest Ref Range: 50 - 90 % 28 (L)  Other Cells, Fluid Latest Units: % CORRELATE WITH CYTOLOGY.  Fluid Type-FCT Unknown Bronch Lavag  Color, Fluid Latest Ref Range: YELLOW  COLORLESS (A)  Total  Nucleated Cell Count, Fluid Latest Ref Range: 0 - 1,000 cu mm 325  Lymphs, Fluid Latest Units: % 5  Appearance, Fluid Latest Ref Range: CLEAR  HAZY (A)  Neutrophil Count, Fluid Latest Ref Range: 0 - 25 % 67 (H)    Component 2 wk ago  Fungal result 1 Candida albicans Abnormal    AFB Specimen Processing Concentration   Acid Fast Smear Negative     OV 10/23/2019  Subjective:  Patient ID: [name], female , DOB: [birth year] , age 63 y.o. , MRN: [id number] , ADDRESS: [address] [place] [facility] [address]   10/23/2019 -   Chief Complaint  Patient presents with   Follow-up    Chronic cough     HPI [name] 67 y.o. -returns for follow-up of her chronic cough.  After her last visit bronchoalveolar lavage cultures continue to be negative other than Candida as above.  She thinks the Candida might have been due to the different steroid inhaler she is taking in the past.  Most recently she has been on Advair for 6 months.  She notices no difference because of it.  She continues to have a cough with clearing of the throat.  She states ice helps continue to soothe her cough.  She says it is random.  She is clearing her throat.  Stopping fish oil has not helped her.  Taking 1 week of  Diflucan has not helped her.  She wants to try other interventions to get rid of her symptoms.  However on the symptom questionnaire she is some better which she later admitted to that she was some better but not fully better.     OV 11/13/2020  Subjective:  Patient ID: [name], female , DOB: Jan 05, 1956 , age 22 y.o. , MRN: [id number] , ADDRESS: [address] [place] [address] PCP Zola Button, Grayling Congress, DO Patient Care Team: Zola Button, Grayling Congress, DO as PCP - General [name], MD as PCP - Cardiology (Cardiology) Sidney Ace, MD as Referring Physician (Allergy) Kalman Shan, MD as Consulting Physician (Pulmonary Disease) Elmon Else, MD as Consulting Physician (Dermatology)  This Provider for this visit: Treatment Team:  Attending Provider: Kalman Shan, MD    11/13/2020 -   Chief Complaint  Patient presents with   Follow-up    Pt states her cough has been doing better with being on gabapentin.   Follow-up chronic cough Follow-up lung nodule multiple with largest 8 mm and right lower lobe May 2022 [stable through November 2021] Former history of smoking  HPI [name] 67 y.o. -returns for follow-up.  At this point she had called over a month ago saying cough is back.  We prescribed her gabapentin and the cough is almost resolved.  She feels gabapentin is helping her.  She seen Melissa. [employer] [name] and apparently he has ruled out allergic rhinitis but she continues on Xyzal and Singulair.  She is not on any inhalers.  Last visit I told her to stop her fish oil which she has.  This visit I noticed that she is on Fosamax.  This can cause acid reflux.  We will give her instructions to talk to primary care and stop Fosamax.  Overall she is feeling well.  Her most recent CT chest was in May 2022 with multiple nodules with the largest being 8 mm right lower lobe.  That was stable since November 2021.  Radiologist recommending CT scan in 18 months from May 2022  which would be end of 2023 but given history of smoking I will probably get it sooner.       CLINICAL DATA:  Bronchiectasis with lung nodules.   EXAM: CT CHEST WITHOUT CONTRAST   TECHNIQUE: Multidetector CT imaging of the chest was performed following the standard protocol without IV contrast.   COMPARISON:  12/24/2019   FINDINGS: Cardiovascular: The heart size is normal. No substantial pericardial effusion. Atherosclerotic calcification is noted in the wall of the thoracic aorta.   Mediastinum/Nodes: No mediastinal lymphadenopathy. No evidence for gross hilar lymphadenopathy although assessment is limited by the lack of intravenous contrast on today's study. The esophagus has normal imaging features. There is no axillary lymphadenopathy.   Lungs/Pleura: Biapical pleuroparenchymal scarring evident with stable appearance of the right apical staple line. 3 mm nodule in the medial right apex measured previously is stable when remeasured in the same dimension today. Previously measured 3 mm anterior right upper lobe pulmonary nodule has resolved in the interval. 8 x 4 mm right lower lobe nodule on 83/8 was 7 x 5 mm previously, unchanged. Several additional scattered tiny pulmonary nodules are stable. No new suspicious pulmonary nodule or mass. No focal airspace consolidation. No pleural effusion.   Upper Abdomen: Unremarkable.   Musculoskeletal: No worrisome lytic or sclerotic osseous abnormality.   IMPRESSION: 1. Interval resolution of the 3 mm anterior right upper lobe pulmonary nodule. Other scattered tiny bilateral pulmonary nodules are stable in the interval including the 8 x 4 mm right lower lobe nodule. CT at 18-24 months (from today's scan) is considered optional for low-risk patients, but is recommended for high-risk patients. This recommendation follows the consensus statement: Guidelines for Management of Incidental Pulmonary Nodules Detected on CT Images:  From the Fleischner Society 2017; Radiology 2017; 284:228-243. 2. No new suspicious pulmonary nodule or mass. 3. Aortic Atherosclerosis (ICD10-I70.0).       OV 02/05/2021  Subjective:  Patient ID: [name], female , DOB: 11/06/1955 , age 32 y.o. , MRN: [id number] , ADDRESS: [address] [place] [address] PCP Zola Button, Grayling Congress, DO Patient Care Team: Zola Button, Grayling Congress, DO as PCP - General [name], MD as PCP - Cardiology (Cardiology) Sidney Ace, MD as Referring Physician (Allergy) Kalman Shan, MD as Consulting Physician (Pulmonary Disease) Elmon Else, MD as Consulting Physician (Dermatology)  This Provider for this visit: Treatment Team:  Attending Provider: Kalman Shan, MD    02/05/2021 -   Chief Complaint  Patient presents with   Follow-up    Pt states she has been doing okay since last visit and denies any complaints.    Follow-up chronic cough -Repeat gabapentin since August 2022 -Stop Fosamax October 2022 Follow-up lung nodule multiple with largest 8 mm and right lower lobe May 2022 [stable through November 2021] Former history of smoking  HPI [name] 67 y.o. -returns for follow-up.  After her last visit she stopped her Fosamax.  She believes the cough is stable/well-controlled.  She takes the gabapentin 3 mg 3 times daily.  She still states that sometimes unexpectedly early in the morning she will cough.  Dust and fumes can irritate her.  Restless cough score is 7 which is much better than 2021 but stable/slightly worse than September 2022.  Details below.  Nevertheless she is willing to cut down on the gabapentin and see what happens.  Last time she stopped gabapentin and within 5 to 6 weeks the cough came back.  CT Chest data  No results found.    OV 06/19/2021  Subjective:  Patient ID: [name], female , DOB: Oct 06, 1955 , age 33 y.o. , MRN: [id number] , ADDRESS: [address] [place]  [address] PCP Zola Button, Grayling Congress, DO Patient Care Team: Zola Button, Grayling Congress, DO as PCP - General [name], MD as PCP - Cardiology (Cardiology) Sidney Ace, MD as Referring Physician (Allergy) Kalman Shan, MD as Consulting Physician (Pulmonary Disease) Elmon Else, MD as Consulting Physician (Dermatology)  This Provider for this visit: Treatment Team:  Attending Provider: Kalman Shan, MD   Follow-up chronic cough -Repeat gabapentin since August 2022 -Stop Fosamax October 2022 Follow-up lung nodule multiple with largest 8 mm and right lower lobe May 2022 [stable through November 2021]  - did not do April 2023 CT  Covid sprng 2023 - april  Former history of smoking  06/19/2021 -   Chief Complaint  Patient presents with   Follow-up    Pt states she is doing much better since her last visit.     HPI [name] 67 y.o. -cough followup.Recent covid +. Rx paxlovid. Cough better but still +. Uses mask while doing dusting. On bid gabapenting and tolerating. STill with some nasal congestion. Supposed to CT for nodules but did not do RSI cough score below:    Does JAzz Exercise with Merdis Delay 10/29/2021  Subjective:  Patient ID: [name], female , DOB: September 22, 1955 , age 25 y.o. , MRN: [id number] , ADDRESS: [address] [place] [address] PCP Zola Button, Grayling Congress, DO Patient Care Team: Zola Button, Grayling Congress, DO as PCP - General [name], MD as PCP - Cardiology (Cardiology) Sidney Ace, MD as Referring Physician (Allergy) Kalman Shan, MD as Consulting Physician (Pulmonary Disease) Elmon Else, MD as Consulting Physician (Dermatology)  This Provider for this visit: Treatment Team:  Attending Provider: Kalman Shan, MD     10/29/2021 -   Chief Complaint  Patient presents with   Follow-up    Pt states she has been doing okay since last visit. States she still has a cough that she believes is worse in the  evenings.     HPI [name] 67 y.o. -returns for follow-up.Still hs some cough . IS on gabapentin lower dose 300mg  once daily at night. Summer has been tough due to humidity. She is willing to taper the gabapentin to off an dsee. HAd CT - lung nodules are stable. No further followup needed  CT chewt July 2023  Narrative & Impression  CLINICAL DATA:  Multiple lung nodules.  Follow-up.   EXAM: CT CHEST WITHOUT CONTRAST   TECHNIQUE: Multidetector CT imaging of the chest was performed following the standard protocol without IV contrast.   RADIATION DOSE REDUCTION: This exam was performed according to the departmental dose-optimization program which includes automated exposure control, adjustment of the mA and/or kV according to patient size and/or use of iterative reconstruction technique.   COMPARISON:  07/01/2020   FINDINGS: Cardiovascular: Normal heart size. Aortic atherosclerosis and coronary artery calcifications. No pericardial effusion identified.   Mediastinum/Nodes: No enlarged mediastinal or axillary lymph nodes. Thyroid gland, trachea, and esophagus demonstrate no significant findings.   Lungs/Pleura: Centrilobular and paraseptal emphysema. N a o pleural effusion or airspace consolidation. Scattered areas of mild cylindrical bronchiectasis identified bilaterally. Medial right upper lobe lung nodule is unchanged measuring 4 mm, image 26/5. Left upper lobe lung nodule measures 3 mm and  is stable in the interval, image 29/5. Posteromedial right lower lobe lung nodule is stable measuring 8 mm, image 79/5. This is been unchanged since 12/24/2019 and 06/29/2019. Similar appearance evaluate pickle pleuroparenchymal scarring with right apical suture chain. A few scattered areas of mucoid impaction noted within dilated distal bronchials. No new lung nodules identified.   Upper Abdomen: No acute findings. Posterior right lobe of liver hypodense structure measures 4.6 x  2.7 cm. This is unchanged compared with the previous exam. MR from 12/26/2012 categorized as this is a benign liver hemangioma.   Musculoskeletal: No chest wall mass or suspicious bone lesions identified.   IMPRESSION: 1. Stable appearance of small bilateral pulmonary nodules these are compatible with a benign process. No follow-up imaging recommended. 2. Stable right lobe of liver hemangioma. 3. Aortic Atherosclerosis (ICD10-I70.0).     Electronically Signed   By: Signa Kell M.D.   On: 09/03/2021 14:13    Presents today for her to 61-month follow-up/ chronic cough. Patient tapered off gabapentin at the end of January 2024.   Cough is noticeably worse sine being off gabapentin, she tapered off end of January. She has been off gabapentin for 1 week. Husband has noticed she is coughing more since stopping medication.  Cough is dry, no chest congestion. Denies chest tightness, wheezing or shortness of breath. She exercises Use albuterol 30 min prior to exercise class, if she forgets to take SABA she will cough during class. She takes montelukast and xyzal at bedtime.    OV 07/13/2022  Subjective:  Patient ID: [name], female , DOB: [birth year] , age 28 y.o. , MRN: [id number] , ADDRESS: [address] [place] [address] PCP Zola Button, Grayling Congress, DO Patient Care Team: Zola Button, Grayling Congress, DO as PCP - General [name], MD as PCP - Cardiology (Cardiology) Sidney Ace, MD as Referring Physician (Allergy) Kalman Shan, MD as Consulting Physician (Pulmonary Disease) Elmon Else, MD as Consulting Physician (Dermatology)  This Provider for this visit: Treatment Team:  Attending Provider: Kalman Shan, MD  Follow-up chronic cough --Stop Fosamax October 2022 -  Cough significantly improved after restart of gabapentin full dose in August 2022 and stopping fosfamax OCt 2022 and on gabapentin 300mg  twice daily since dec 2022 -> cough not resolved but  mildly persistent as of Sept 2023 on gabapentin 300mg  once at night-> restarted gabapentin September 2023 and as of May 2024 tapered to 300 mg on Monday Wednesday and Friday  Follow-up lung nodule multiple with largest 8 mm and right lower lobe May 2022 [stable through November 2021]  - July 2023 ->stable -> no futher CT  Covid sprng 2023 - april  Former history of smoking  07/13/2022 -   Chief Complaint  Patient presents with   Follow-up    F/up, cough     HPI [name] 67 y.o. -returns for follow-up.  After her last visit in September 2023.  We at that time did another reintroduction of gabapentin.  She was supposed to slowly wean off but she has not been able to.  She feels she needs some gabapentin at baseline.  Currently taking 3 mg Monday Wednesday and Friday night.  She feels anything below this cough comes back.  This is able to control the cough.  She remembers going to [employer] in the waiting room walking to her that she is no longer coughing.  She last saw ENT Melissa. Annalee Genta.  But he has since retired.  We  talked about seeing Melissa. Barnie Alderman at [facility] but she does not want to go to [place].  I suggest that she see Melissa. Billy Fischer who trained under Melissa. Barnie Alderman..  And she is located in [facility].  Patient is willing to go there.  Will make a referral for that.  She is up-to-date with her respiratory vaccines.       [name] Reflux Symptom Index (> 13-15 suggestive of LPR cough)  06/25/2019  10/23/2019  11/13/2020 gabepentin 02/05/2021  06/19/2021  03/31/2022   Hoarseness of problem with voice 3 2 0 0 1 0  Clearing  Of Throat 1 2 0 2 1 2   Excess throat mucus or feeling of post nasal drip 1 1 0 0 0 0  Difficulty swallowing food, liquid or tablets 0 0 0 0 0 0  Cough after eating or lying down 2 0 0 0 1 1  Breathing difficulties or choking episodes 3 2 0 0 0 0  Troublesome or annoying cough 5 4 2 3 2 4   Sensation of  something sticking in throat or lump in throat 2 0 0 1 1 0  Heartburn, chest pain, indigestion, or stomach acid coming up 1 0 0 1 0 0  TOTAL 17 11 2 7 6 7       PFT     Latest Ref Rng & Units 08/02/2019    3:45 PM  PFT Results  FVC-Pre L 2.76   FVC-Predicted Pre % 91   FVC-Post L 2.87   FVC-Predicted Post % 95   Pre FEV1/FVC % % 79   Post FEV1/FCV % % 79   FEV1-Pre L 2.19   FEV1-Predicted Pre % 95   FEV1-Post L 2.25   DLCO uncorrected ml/min/mmHg 19.12   DLCO UNC% % 100   DLCO corrected ml/min/mmHg 19.12   DLCO COR %Predicted % 100   DLVA Predicted % 102   TLC L 4.87   TLC % Predicted % 99   RV % Predicted % 102        has a past medical history of Allergy, Cataract, Chronic cough (01/24/2019), Costochondritis (01/24/2019), Epigastric pain (07/29/2016), FIBROIDS, UTERUS (01/18/2007), H/O Bell's palsy (05/2010), HYPERTENSION (03/24/2007), MENORRHAGIA, PERIMENOPAUSAL (01/13/2007), MOLE (05/07/2010), Osteopenia (08/23/2014), RUQ pain (07/29/2016), Spontaneous pneumothorax (2005), and Spontaneous tension pneumothorax (03/24/2007).   reports that she quit smoking about 40 years ago. Her smoking use included cigarettes. She has a 14.00 pack-year smoking history. She has never used smokeless tobacco.  Past Surgical History:  Procedure Laterality Date   BRONCHIAL WASHINGS  08/06/2019   Procedure: BRONCHIAL lavage;  Surgeon: Kalman Shan, MD;  Location: [facility];  Service: Endoscopy;;   CATARACT EXTRACTION W/ INTRAOCULAR LENS IMPLANT Bilateral    june 2023   CESAREAN SECTION     CHEST TUBE INSERTION  2005   spontaneus Pneumothorax   COLONOSCOPY     SPIROMETRY  01/20/2015   Melissa. [place] [name]   VIDEO ASSISTED THORACOSCOPY  2005   VIDEO BRONCHOSCOPY N/A 08/06/2019   Procedure: VIDEO BRONCHOSCOPY WITHOUT FLUORO;  Surgeon: Kalman Shan, MD;  Location: [facility];  Service: Endoscopy;  Laterality: N/A;   WISDOM TOOTH EXTRACTION      No Known Allergies  Immunization  History  Administered Date(s) Administered   DTaP 02/03/2019   Fluad Quad(high Dose 65+) 11/03/2018, 11/06/2020, 11/19/2021   Influenza Whole 11/30/2006, 12/13/2007, 01/01/2009, 12/31/2009   Influenza, High Dose Seasonal PF 04/19/2016, 04/12/2017, 03/14/2018, 04/30/2019   Influenza,inj,Quad PF,6+ Mos 11/27/2017  Influenza-Unspecified 10/24/2013, 11/28/2016, 11/23/2019   PFIZER(Purple Top)SARS-COV-2 Vaccination 03/31/2019, 04/25/2019, 11/23/2019, 09/01/2020, 12/03/2020   Pfizer Covid-19 Vaccine Bivalent Booster 42yrs & up 11/23/2021   Pneumococcal Conjugate-13 11/03/2018   Pneumococcal Polysaccharide-23 04/12/2017, 03/14/2018, 04/30/2019, 11/05/2019, 11/04/2020   Respiratory Syncytial Virus Vaccine,Recomb Aduvanted(Arexvy) 12/07/2021   Td 08/28/2002, 04/18/2008   Tdap 01/24/2019   Zoster Recombinat (Shingrix) 11/28/2016, 02/04/2017   Zoster, Live 08/20/2013    Family History  Problem Relation Age of Onset   Hypertension Mother    Stroke Mother    Heart disease Father    Hypertension Father    Cancer Maternal Grandmother 73       breast   Diabetes Maternal Grandmother    Colon cancer Neg Hx    Esophageal cancer Neg Hx    Rectal cancer Neg Hx    Stomach cancer Neg Hx      Current Outpatient Medications:    acetaminophen (TYLENOL) 500 MG tablet, Take 500 mg by mouth every 6 (six) hours as needed for moderate pain or headache., Disp: , Rfl:    albuterol (VENTOLIN HFA) 108 (90 Base) MCG/ACT inhaler, Inhale 1-2 puffs into the lungs every 6 (six) hours as needed for wheezing or shortness of breath., Disp: , Rfl:    aspirin EC 81 MG tablet, Take 81 mg by mouth daily. Swallow whole., Disp: , Rfl:    atorvastatin (LIPITOR) 20 MG tablet, TAKE 1 TABLET(20 MG) BY MOUTH DAILY, Disp: 90 tablet, Rfl: 2   azelastine (ASTELIN) 0.1 % nasal spray, Place 1 spray into both nostrils 2 (two) times daily., Disp: , Rfl:    denosumab (PROLIA) 60 MG/ML SOSY injection, Inject 60 mg into the skin every 6  (six) months., Disp: , Rfl:    FIBER PO, Take by mouth daily. Either Benefiber or Metamucil, Disp: , Rfl:    fluticasone (FLONASE) 50 MCG/ACT nasal spray, Place 1 spray into both nostrils daily., Disp: , Rfl:    gabapentin (NEURONTIN) 300 MG capsule, TAKE 1 CAPSULE BY MOUTH MONDAY, WEDNESDAY, AND FRIDAY, Disp: 30 capsule, Rfl: 3   levocetirizine (XYZAL) 5 MG tablet, Take 5 mg by mouth every evening. , Disp: , Rfl:    montelukast (SINGULAIR) 10 MG tablet, Take 10 mg by mouth at bedtime., Disp: , Rfl:    spironolactone (ALDACTONE) 25 MG tablet, TAKE 1 TABLET(25 MG) BY MOUTH DAILY, Disp: 90 tablet, Rfl: 3   UNABLE TO FIND, Take by mouth daily. Med Name: ViActiv, Disp: , Rfl:    valACYclovir (VALTREX) 1000 MG tablet, Take 1 tablet (1,000 mg total) by mouth 3 (three) times daily., Disp: 30 tablet, Rfl: 5   valACYclovir (VALTREX) 500 MG tablet, Take 1 tablet po bid x 5 days as needed for outbreak, Disp: 30 tablet, Rfl: 1   valsartan (DIOVAN) 160 MG tablet, Take 1 tablet (160 mg total) by mouth daily., Disp: 90 tablet, Rfl: 3   nitroGLYCERIN (NITROSTAT) 0.4 MG SL tablet, Place 1 tablet (0.4 mg total) under the tongue every 5 (five) minutes as needed for chest pain., Disp: 90 tablet, Rfl: 3  Current Facility-Administered Medications:    gi cocktail (Maalox,Lidocaine,Donnatal), 30 mL, Oral, Once, [employer] R, DO      Objective:   Vitals:   07/13/22 1546  BP: 120/80  Pulse: 79  SpO2: 97%  Weight: 127 lb 6.4 oz (57.8 kg)  Height: 5\' 3"  (1.6 m)    Estimated body mass index is 22.57 kg/m as calculated from the following:   Height as of  this encounter: 5\' 3"  (1.6 m).   Weight as of this encounter: 127 lb 6.4 oz (57.8 kg).  @WEIGHTCHANGE @  [employer]   07/13/22 1546  Weight: 127 lb 6.4 oz (57.8 kg)     Physical Exam   General: No distress. Looks well O2 at rest: no Cane present: no Sitting in wheel chair: no Frail: no Obese: no Neuro: Alert and Oriented x 3. GCS 15.  Speech normal Psych: Pleasant Resp:  Barrel Chest - no.  Wheeze - no, Crackles - no, No overt respiratory distress CVS: Normal heart sounds. Murmurs - no Ext: Stigmata of Connective Tissue Disease - no HEENT: Normal upper airway. PEERL +. No post nasal drip        Assessment:       ICD-10-CM   1. Chronic cough  R05.3     2. 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   Plan -Continue gabapentin 300mg  once daily at night t on Monday Wednesday and Friday - Continue other medications -Avoid getting rhinitis or respiratory infection which can then make your cough worse -Refer Melissa. Billy Fischer at [facility] for evaluation of cough neuropathy  Followup -6-9 months or sooner if needed with Melissa. Marchelle Gearing 15-minute visit    SIGNATURE    Melissa. Kalman Shan, M.D., F.C.C.P,  Pulmonary and Critical Care Medicine Staff Physician, [facility] Director - Interstitial Lung Disease  Program  Pulmonary Fibrosis [facility] at [facility] [place], [place], [address]  Pager: [phone], If no answer or between  15:00h - 7:00h: call [phone] Telephone: [phone]  4:09 PM 07/13/2022

## 2022-07-13 NOTE — Patient Instructions (Addendum)
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   Plan -Continue gabapentin 300mg  once daily at night t on Monday Wednesday and Friday - Continue other medications -Avoid getting rhinitis or respiratory infection which can then make your cough worse -Refer Dr. Billy Fischer at [facility] for evaluation of cough neuropathy  Followup -6-9 months or sooner if needed with Dr. Marchelle Gearing 15-minute visit

## 2022-08-02 DIAGNOSIS — Z1231 Encounter for screening mammogram for malignant neoplasm of breast: Secondary | ICD-10-CM | POA: Diagnosis not present

## 2022-08-02 LAB — HM MAMMOGRAPHY

## 2022-08-03 ENCOUNTER — Encounter: Payer: Self-pay | Admitting: Family Medicine

## 2022-08-06 ENCOUNTER — Other Ambulatory Visit: Payer: Self-pay | Admitting: Cardiology

## 2022-08-20 ENCOUNTER — Encounter: Payer: Self-pay | Admitting: Family Medicine

## 2022-08-22 IMAGING — CT CT CHEST W/O CM
2 of 4 series · 15 of 36 positions shown, 18 images · non-contrast
Comparison: 12/24/2019

CLINICAL DATA: Bronchiectasis with lung nodules.

EXAM:
CT CHEST WITHOUT CONTRAST
TECHNIQUE: Multidetector CT imaging of the chest was performed following the
standard protocol without IV contrast.

[Series 2: chest 2.00 br40 s3 · axial · 0.63mm/px · z∈[+1527,+1785]mm · 12 of 153 slices shown, 15 images (1 of 2)]
[im 12/153  mediastinal]
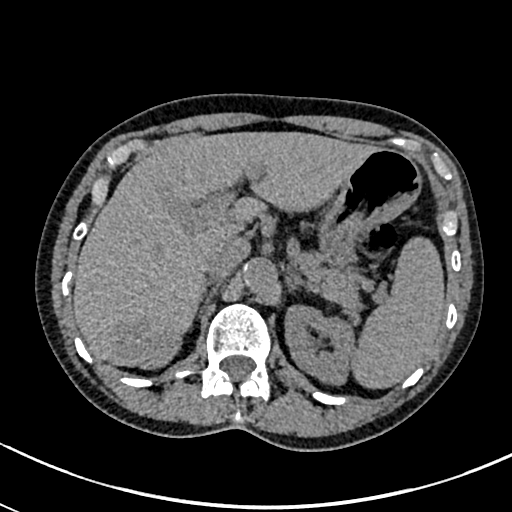
[im 12/153  lung]
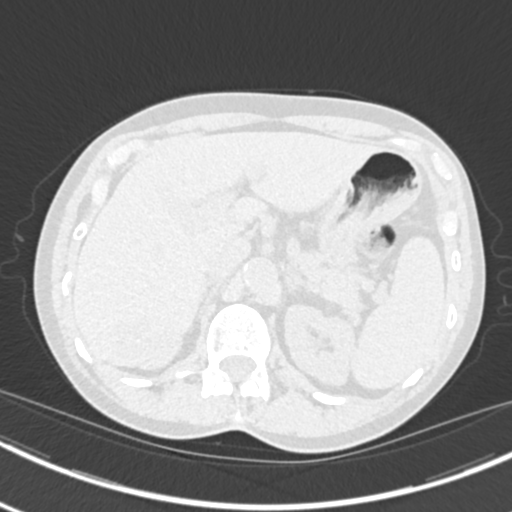
[im 24/153  lung]
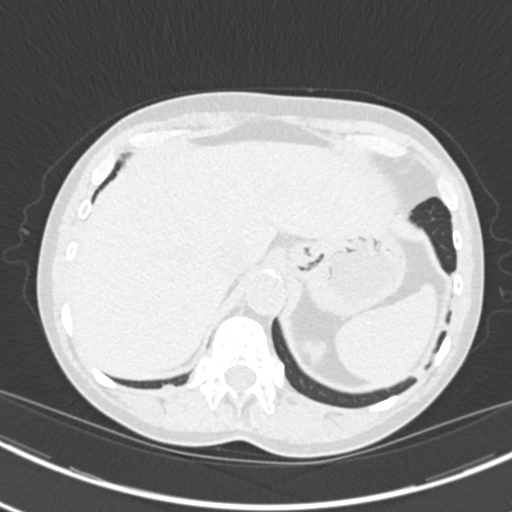
[im 36/153  lung]
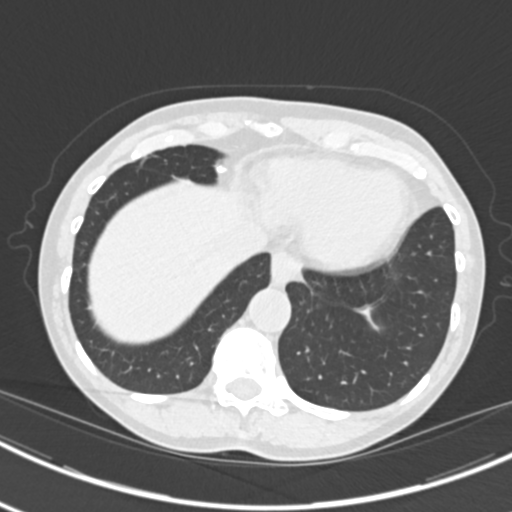
[im 47/153  lung]
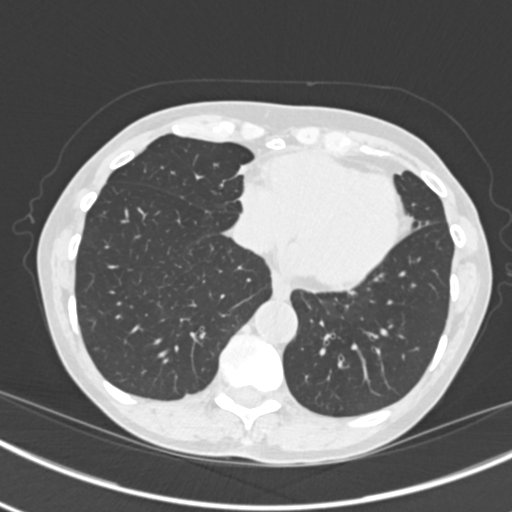
[im 59/153  mediastinal]
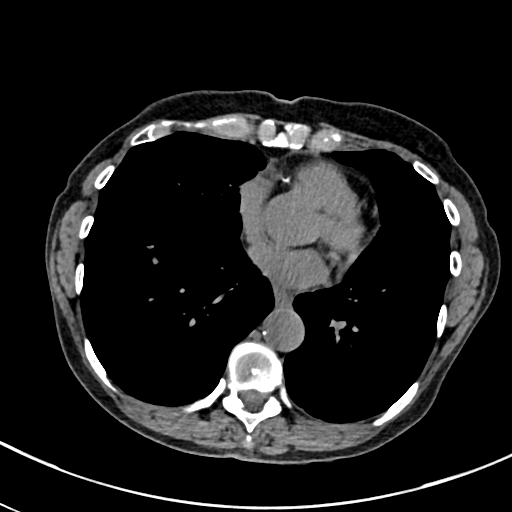
[im 59/153  lung]
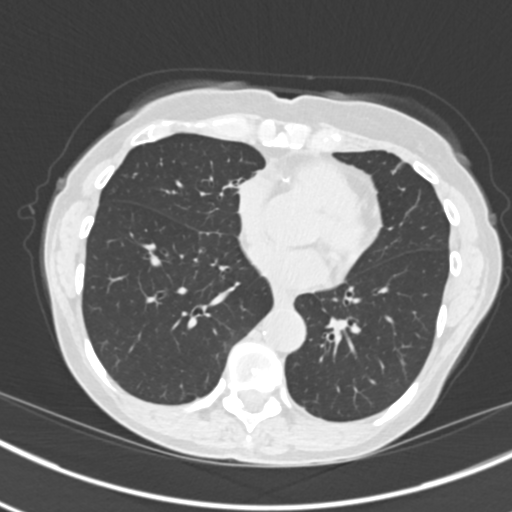
[im 71/153  lung]
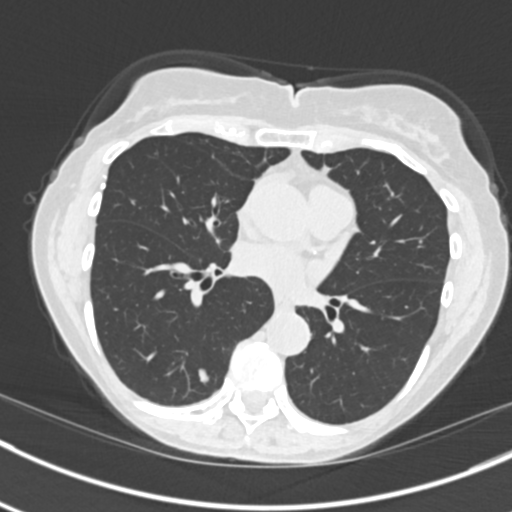
[im 82/153  lung]
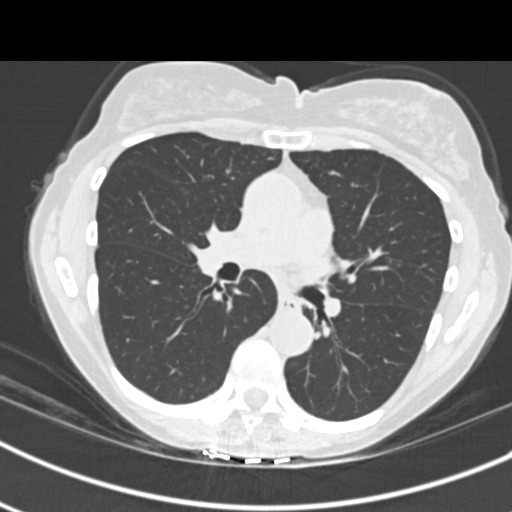
[im 94/153  lung]
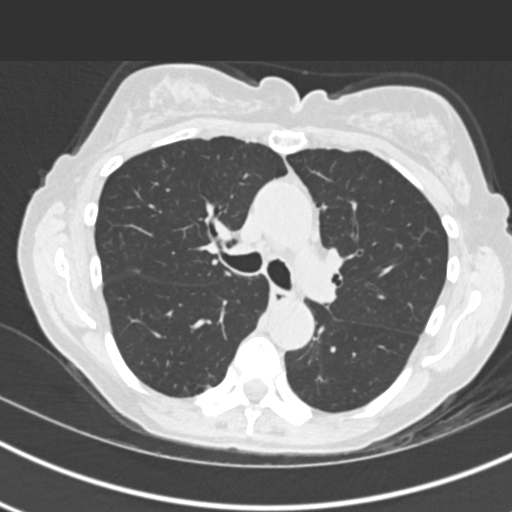
[im 106/153  mediastinal]
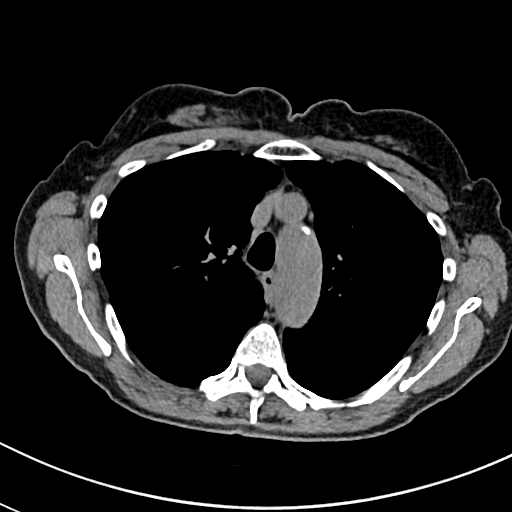
[im 106/153  lung]
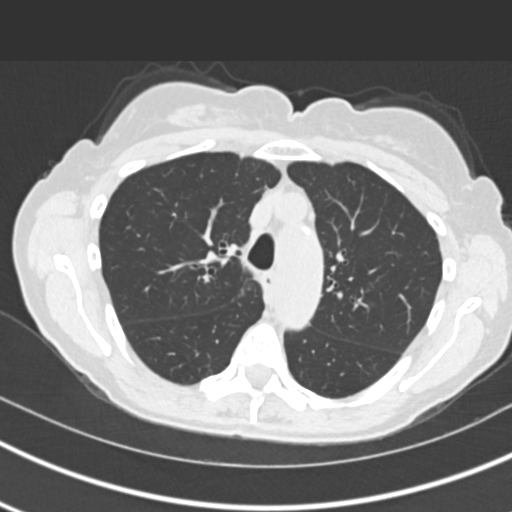
[im 117/153  lung]
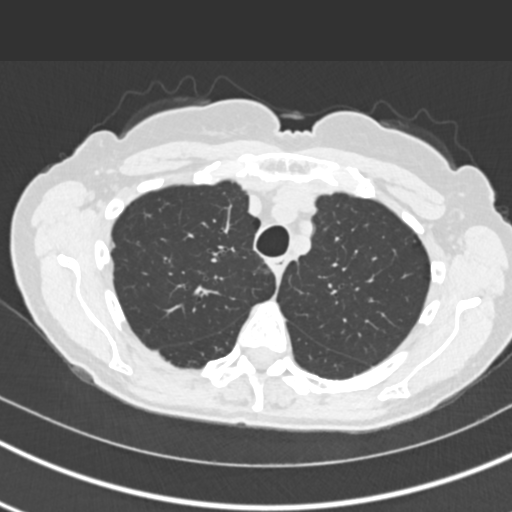
[im 129/153  lung]
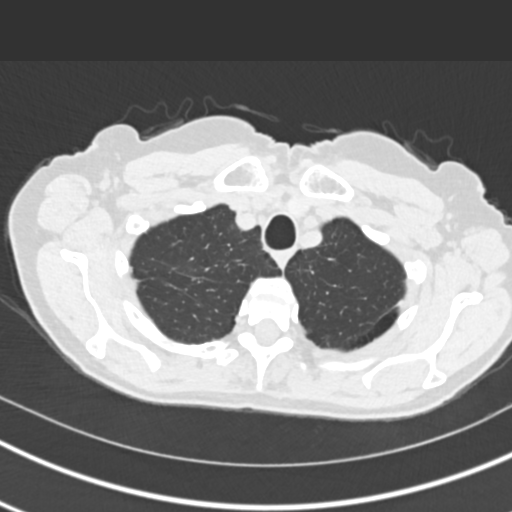
[im 141/153  lung]
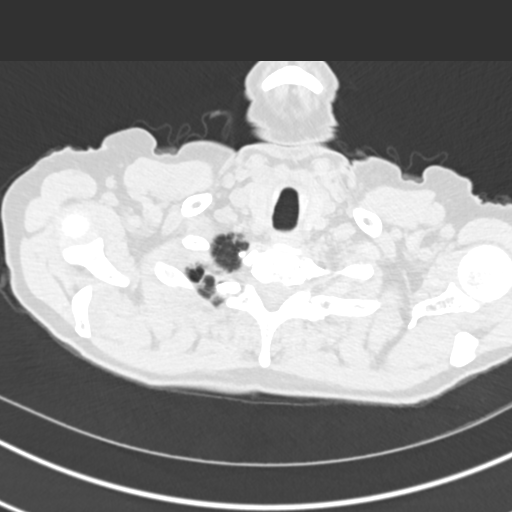

[Series 4: chest 2.00 br40 s3 · coronal · 0.60mm/px · 3 of 156 slices shown (2 of 2)]
[im 32/156  lung]
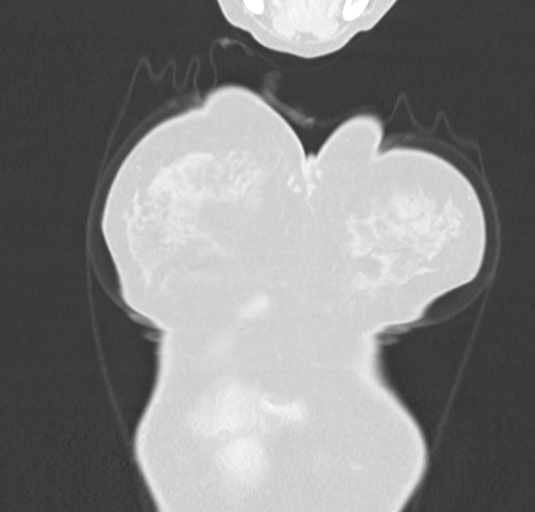
[im 63/156  lung]
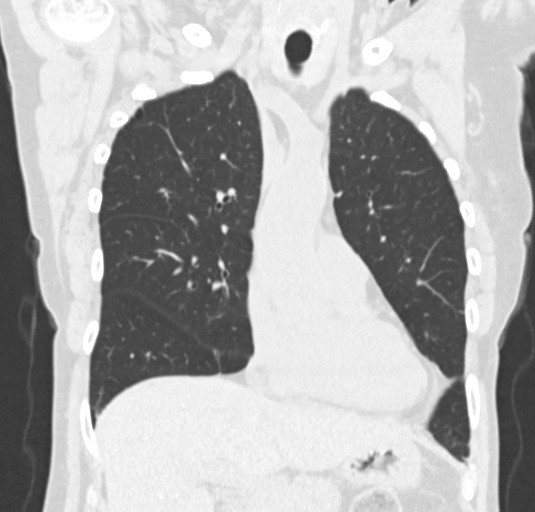
[im 94/156  lung]
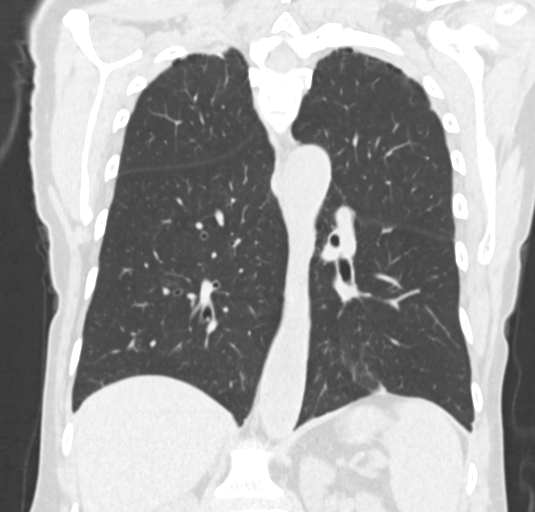

[15 of 36 positions shown; findings below may reference images not displayed]

FINDINGS: Cardiovascular: The heart size is normal. No substantial pericardial
effusion. Atherosclerotic calcification is noted in the wall of the
thoracic aorta.

Mediastinum/Nodes: No mediastinal lymphadenopathy. No evidence for
gross hilar lymphadenopathy although assessment is limited by the
lack of intravenous contrast on today's study. The esophagus has
normal imaging features. There is no axillary lymphadenopathy.

Lungs/Pleura: Biapical pleuroparenchymal scarring evident with
stable appearance of the right apical staple line. 3 mm nodule in
the medial right apex measured previously is stable when remeasured
in the same dimension today. Previously measured 3 mm anterior right
upper lobe pulmonary nodule has resolved in the interval. 8 x 4 mm
right lower lobe nodule on 83/8 was 7 x 5 mm previously, unchanged.
Several additional scattered tiny pulmonary nodules are stable. No
new suspicious pulmonary nodule or mass. No focal airspace
consolidation. No pleural effusion.

Upper Abdomen: Unremarkable.

Musculoskeletal: No worrisome lytic or sclerotic osseous
abnormality.
IMPRESSION: 1. Interval resolution of the 3 mm anterior right upper lobe
pulmonary nodule. Other scattered tiny bilateral pulmonary nodules
are stable in the interval including the 8 x 4 mm right lower lobe
nodule. CT at 18-24 months (from today's scan) is considered
optional for low-risk patients, but is recommended for high-risk
patients. This recommendation follows the consensus statement:
Guidelines for Management of Incidental Pulmonary Nodules Detected
2. No new suspicious pulmonary nodule or mass.
3. Aortic Atherosclerosis ([id number]).

## 2022-08-23 ENCOUNTER — Telehealth: Payer: Self-pay

## 2022-08-23 ENCOUNTER — Other Ambulatory Visit (HOSPITAL_COMMUNITY): Payer: Self-pay

## 2022-08-23 NOTE — Telephone Encounter (Signed)
Pt last injection was 02/11/2023. She was due for Prolia injection 08/12/22. Please check benefits.

## 2022-08-23 NOTE — Telephone Encounter (Signed)
Pharmacy Patient Advocate Encounter    PA for Prolia submitted to [facility] via CoverMyMeds Key or [facility]) confirmation # [id number] Status is pending

## 2022-08-23 NOTE — Telephone Encounter (Signed)
Created new encounter for Prolia BIV. Will route encounter back once benefit verification is complete.  

## 2022-08-23 NOTE — Telephone Encounter (Signed)
Prolia VOB initiated via [email or web address]  Last Prolia inj: 02/10/22 Next Prolia inj DUE: 08/11/21

## 2022-08-24 ENCOUNTER — Other Ambulatory Visit (HOSPITAL_COMMUNITY): Payer: Self-pay

## 2022-08-24 NOTE — Telephone Encounter (Signed)
Pt ready for scheduling for PROLIA on or after : 08/24/22  Out-of-pocket cost due at time of visit: $80  Primary: [employer] Prolia co-insurance: $40 Admin fee co-insurance: $40  Secondary: --- Prolia co-insurance:  Admin fee co-insurance:   Medical Benefit Details: Date Benefits were checked: 08/23/22 Deductible: NO/ Coinsurance: $40/ Admin Fee: $40  Prior Auth: APPROVED PA# [id number] Expiration Date: 04/30/20-02/22/23   Pharmacy benefit: Copay $64 If patient wants fill through the pharmacy benefit please send prescription to: [employer], and include estimated need by date in rx notes. Pharmacy will ship medication directly to the office.  Patient not eligible for Prolia Copay Card. Copay Card can make patient's cost as little as $25. Link to apply: [email or web address]://[email or web address]  ** This summary of benefits is an estimation of the patient's out-of-pocket cost. Exact cost may very based on individual plan coverage.

## 2022-08-24 NOTE — Telephone Encounter (Signed)
Message sent to pt via mychart

## 2022-08-25 ENCOUNTER — Telehealth: Payer: Self-pay | Admitting: *Deleted

## 2022-08-25 ENCOUNTER — Other Ambulatory Visit (HOSPITAL_BASED_OUTPATIENT_CLINIC_OR_DEPARTMENT_OTHER): Payer: Self-pay

## 2022-08-25 ENCOUNTER — Ambulatory Visit (INDEPENDENT_AMBULATORY_CARE_PROVIDER_SITE_OTHER): Payer: Medicare PPO

## 2022-08-25 DIAGNOSIS — M810 Age-related osteoporosis without current pathological fracture: Secondary | ICD-10-CM | POA: Diagnosis not present

## 2022-08-25 MED ORDER — DENOSUMAB 60 MG/ML ~~LOC~~ SOSY
60.0000 mg | PREFILLED_SYRINGE | Freq: Once | SUBCUTANEOUS | Status: AC
Start: 2022-08-25 — End: 2022-08-25
  Administered 2022-08-25: 60 mg via SUBCUTANEOUS

## 2022-08-25 NOTE — Telephone Encounter (Signed)
Prolia given today. 

## 2022-08-25 NOTE — Progress Notes (Signed)
[name] is a 67 y.o. female presents to the office today for Prolia injection, per physician's orders. Original order: Dr Laury Axon Prolia 60 mg SQ was administered L arm today. Patient tolerated injection. Patient due for follow up labs/provider appt: No. Date due: 6 months

## 2022-09-16 DIAGNOSIS — Z961 Presence of intraocular lens: Secondary | ICD-10-CM | POA: Diagnosis not present

## 2022-09-16 DIAGNOSIS — H35372 Puckering of macula, left eye: Secondary | ICD-10-CM | POA: Diagnosis not present

## 2022-09-20 ENCOUNTER — Encounter: Payer: Self-pay | Admitting: Cardiology

## 2022-09-20 ENCOUNTER — Ambulatory Visit: Payer: Medicare PPO | Attending: Cardiology | Admitting: Cardiology

## 2022-09-20 VITALS — BP 130/80 | HR 60 | Ht 63.0 in | Wt 128.0 lb

## 2022-09-20 DIAGNOSIS — I10 Essential (primary) hypertension: Secondary | ICD-10-CM

## 2022-09-20 DIAGNOSIS — I700 Atherosclerosis of aorta: Secondary | ICD-10-CM

## 2022-09-20 DIAGNOSIS — E785 Hyperlipidemia, unspecified: Secondary | ICD-10-CM

## 2022-09-20 DIAGNOSIS — R931 Abnormal findings on diagnostic imaging of heart and coronary circulation: Secondary | ICD-10-CM

## 2022-09-20 MED ORDER — VALSARTAN 160 MG PO TABS
160.0000 mg | ORAL_TABLET | Freq: Every day | ORAL | 3 refills | Status: DC
Start: 2022-09-20 — End: 2023-01-31

## 2022-09-20 MED ORDER — SPIRONOLACTONE 25 MG PO TABS
25.0000 mg | ORAL_TABLET | Freq: Once | ORAL | 3 refills | Status: DC
Start: 2022-09-20 — End: 2023-09-20

## 2022-09-20 MED ORDER — ATORVASTATIN CALCIUM 20 MG PO TABS: 20.0000 mg | ORAL_TABLET | Freq: Every day | ORAL | 3 refills | Status: DC

## 2022-09-20 NOTE — Patient Instructions (Signed)
Medication Instructions:  Your physician recommends that you continue on your current medications as directed. Please refer to the Current Medication list given to you today.  *If you need a refill on your cardiac medications before your next appointment, please call your pharmacy*   Lab Work: None.  If you have labs (blood work) drawn today and your tests are completely normal, you will receive your results only by: MyChart Message (if you have MyChart) OR A paper copy in the mail If you have any lab test that is abnormal or we need to change your treatment, we will call you to review the results.   Testing/Procedures: None.   Follow-Up: At [facility], you and your health needs are our priority.  As part of our continuing mission to provide you with exceptional heart care, we have created designated Provider Care Teams.  These Care Teams include your primary Cardiologist (physician) and Advanced Practice Providers (APPs -  Physician Assistants and Nurse Practitioners) who all work together to provide you with the care you need, when you need it.  We recommend signing up for the patient portal called "MyChart".  Sign up information is provided on this After Visit Summary.  MyChart is used to connect with patients for Virtual Visits (Telemedicine).  Patients are able to view lab/test results, encounter notes, upcoming appointments, etc.  Non-urgent messages can be sent to your provider as well.   To learn more about what you can do with MyChart, go to [email or web address].    Your next appointment:   1 year(s)  Provider:   Traci Turner, MD     

## 2022-09-20 NOTE — Progress Notes (Signed)
Cardiology Office Note    Date:  09/20/2022   ID:  [name], DOB 1955/11/03, MRN [id number]  PCP:  [name], Melissa Congress, DO  Cardiologist:  Melissa Magic, MD   Chief Complaint  Patient presents with   Follow-up    Coronary artery calcifications, HTN     History of Present Illness:  [name] is a 67 y.o. female with a hx of chronic cough (followed with Pulmonary), HTN, spontaneous PTX s/p VATS and coronary artery calcifications. She underwent HRCT for chronic cough and was found to have coronary atherosclerosis in the RCA and LAD.   Coronary Ca score was obtained that was 38 and was 69th% for age and sex matched controls.  Lexiscan myoview 07/2019 showed no ischemia.   She is here today for followup and is doing well.  She denies any chest pain or pressure, SOB, DOE, PND, orthopnea, LE edema, dizziness, palpitations or syncope. She is compliant with her meds and is tolerating meds with no SE.    Past Medical History:  Diagnosis Date   Allergy    Cataract    Chronic cough 01/24/2019   Costochondritis 01/24/2019   Epigastric pain 07/29/2016   FIBROIDS, UTERUS 01/18/2007   Qualifier: Diagnosis of  By: [name]     H/O Bell's palsy 05/2010   HYPERTENSION 03/24/2007   Qualifier: Diagnosis of  By: [name], [name]     MENORRHAGIA, PERIMENOPAUSAL 01/13/2007   Qualifier: Diagnosis of  By: [name]     MOLE 05/07/2010   Qualifier: Diagnosis of  By: [name]     Osteopenia 08/23/2014   RUQ pain 07/29/2016   Spontaneous pneumothorax 2005   R, s/p tube, then VATS   Spontaneous tension pneumothorax 03/24/2007   Qualifier: Diagnosis of  By: [name]      Past Surgical History:  Procedure Laterality Date   BRONCHIAL WASHINGS  08/06/2019   Procedure: BRONCHIAL lavage;  Surgeon: Melissa Shan, MD;  Location: [facility];  Service: Endoscopy;;   CATARACT EXTRACTION W/ INTRAOCULAR LENS IMPLANT Bilateral    june 2023   CESAREAN SECTION      CHEST TUBE INSERTION  2005   spontaneus Pneumothorax   COLONOSCOPY     SPIROMETRY  01/20/2015   Dr. [place] [name]   VIDEO ASSISTED THORACOSCOPY  2005   VIDEO BRONCHOSCOPY N/A 08/06/2019   Procedure: VIDEO BRONCHOSCOPY WITHOUT FLUORO;  Surgeon: Melissa Shan, MD;  Location: [facility];  Service: Endoscopy;  Laterality: N/A;   WISDOM TOOTH EXTRACTION      Current Medications: Current Meds  Medication Sig   acetaminophen (TYLENOL) 500 MG tablet Take 500 mg by mouth every 6 (six) hours as needed for moderate pain or headache.   albuterol (VENTOLIN HFA) 108 (90 Base) MCG/ACT inhaler Inhale 1-2 puffs into the lungs every 6 (six) hours as needed for wheezing or shortness of breath.   aspirin EC 81 MG tablet Take 81 mg by mouth daily. Swallow whole.   atorvastatin (LIPITOR) 20 MG tablet TAKE 1 TABLET(20 MG) BY MOUTH DAILY   azelastine (ASTELIN) 0.1 % nasal spray Place 1 spray into both nostrils 2 (two) times daily.   denosumab (PROLIA) 60 MG/ML SOSY injection Inject 60 mg into the skin every 6 (six) months.   FIBER PO Take by mouth daily. Either Benefiber or Metamucil   fluticasone (FLONASE) 50 MCG/ACT nasal spray Place 1 spray into both nostrils daily.   gabapentin (NEURONTIN) 300 MG capsule TAKE 1  CAPSULE BY MOUTH MONDAY, WEDNESDAY, AND FRIDAY   levocetirizine (XYZAL) 5 MG tablet Take 5 mg by mouth every evening.    montelukast (SINGULAIR) 10 MG tablet Take 10 mg by mouth at bedtime.   spironolactone (ALDACTONE) 25 MG tablet TAKE 1 TABLET(25 MG) BY MOUTH DAILY   UNABLE TO FIND Take by mouth daily. Med Name: ViActiv   valACYclovir (VALTREX) 1000 MG tablet Take 1 tablet (1,000 mg total) by mouth 3 (three) times daily.   valsartan (DIOVAN) 160 MG tablet Take 1 tablet (160 mg total) by mouth daily.   Current Facility-Administered Medications for the 09/20/22 encounter (Office Visit) with Melissa Reichert, MD  Medication   gi cocktail (Maalox,Lidocaine,Donnatal)    Allergies:   Patient has no  known allergies.   Social History   Socioeconomic History   Marital status: Married    Spouse name: Not on file   Number of children: 3   Years of education: Not on file   Highest education level: 12th grade  Occupational History   Occupation: southern Engineer, site    Employer: [employer]  Tobacco Use   Smoking status: Former    Current packs/day: 0.00    Average packs/day: 1 pack/day for 14.0 years (14.0 ttl pk-yrs)    Types: Cigarettes    Start date: 07/10/1968    Quit date: 07/11/1982    Years since quitting: 40.2   Smokeless tobacco: Never  Vaping Use   Vaping status: Never Used  Substance and Sexual Activity   Alcohol use: Yes    Alcohol/week: 0.0 standard drinks of alcohol    Comment: rare   Drug use: No   Sexual activity: Yes    Partners: Male    Birth control/protection: Post-menopausal    Comment: 1st intercourse 67 yo-Fewer than 5 partners  Other Topics Concern   Not on file  Social History Narrative   Exercise-- jazzersize  3x a week    Social Determinants of Health   Financial Resource Strain: Low Risk  (05/13/2022)   Overall Financial Resource Strain (CARDIA)    Difficulty of Paying Living Expenses: Not hard at all  Food Insecurity: No Food Insecurity (05/13/2022)   Hunger Vital Sign    Worried About Running Out of Food in the Last Year: Never true    Ran Out of Food in the Last Year: Never true  Transportation Needs: No Transportation Needs (05/13/2022)   PRAPARE - Administrator, Civil Service (Medical): No    Lack of Transportation (Non-Medical): No  Physical Activity: Sufficiently Active (05/13/2022)   Exercise Vital Sign    Days of Exercise per Week: 6 days    Minutes of Exercise per Session: 60 min  Stress: No Stress Concern Present (05/13/2022)   [employer] of Occupational Health - Occupational Stress Questionnaire    Feeling of Stress : Only a little  Social Connections: Socially Integrated (05/13/2022)    Social Connection and Isolation Panel [NHANES]    Frequency of Communication with Friends and Family: Twice a week    Frequency of Social Gatherings with Friends and Family: Once a week    Attends Religious Services: 1 to 4 times per year    Active Member of [employer] or Organizations: Yes    Attends Engineer, structural: More than 4 times per year    Marital Status: Married     Family History:  The patient's family history includes Cancer (age of onset: 61) in her maternal grandmother; Diabetes  in her maternal grandmother; Heart disease in her father; Hypertension in her father and mother; Stroke in her mother.   ROS:   Please see the history of present illness.    ROS All other systems reviewed and are negative.      No data to display             PHYSICAL EXAM:   VS:  BP 130/80 (BP Location: Left Arm, Patient Position: Sitting, Cuff Size: Normal)   Pulse 60   Ht 5\' 3"  (1.6 m)   Wt 128 lb (58.1 kg)   SpO2 98%   BMI 22.67 kg/m     GEN: Well nourished, well developed in no acute distress HEENT: Normal NECK: No JVD; No carotid bruits LYMPHATICS: No lymphadenopathy CARDIAC:RRR, no murmurs, rubs, gallops RESPIRATORY:  Clear to auscultation without rales, wheezing or rhonchi  ABDOMEN: Soft, non-tender, non-distended MUSCULOSKELETAL:  No edema; No deformity  SKIN: Warm and dry NEUROLOGIC:  Alert and oriented x 3 PSYCHIATRIC:  Normal affect  Wt Readings from Last 3 Encounters:  09/20/22 128 lb (58.1 kg)  07/13/22 127 lb 6.4 oz (57.8 kg)  05/20/22 126 lb (57.2 kg)      Studies/Labs Reviewed:  EKG Interpretation Date/Time:  Monday September 20 2022 10:56:57 EDT Ventricular Rate:  60 PR Interval:  148 QRS Duration:  124 QT Interval:  412 QTC Calculation: 412 R Axis:   65  Text Interpretation: Normal sinus rhythm Right bundle branch block When compared with ECG of 24-Jan-2021 21:04, No significant change was found Confirmed by [name] ([id number]) on 09/20/2022  11:09:51 AM    Recent Labs: 05/20/2022: ALT 18; BUN 10; Creatinine, Ser 0.59; Hemoglobin 12.4; Platelets 176.0; Potassium 3.7; Sodium 136   Lipid Panel    Component Value Date/Time   CHOL 128 05/20/2022 1018   CHOL 173 09/12/2019 1029   TRIG 78.0 05/20/2022 1018   HDL 60.30 05/20/2022 1018   HDL 75 09/12/2019 1029   CHOLHDL 2 05/20/2022 1018   VLDL 15.6 05/20/2022 1018   LDLCALC 52 05/20/2022 1018   LDLCALC 62 11/05/2019 1353   LDLDIRECT 111.7 05/07/2010 1333    Additional studies/ records that were reviewed today include:  Coronary Ca score and Lexi myoview    ASSESSMENT:    1. Agatston coronary artery calcium score less than 100   2. Primary hypertension   3. Hyperlipidemia LDL goal <70   4. Aortic atherosclerosis (HCC)       PLAN:  In order of problems listed above:  1.  Coronary artery calcifications -noted on HRCT for workup of cough -coronary Ca score was 38 and was 69th% for age and sex matched controls in 08/2019. -her CRFs include HTN, postmenopausal, remote tobacco use and fm hx of CAD in her father [name] 07/2019 showed no ischemia -She denies any anginal symptoms since I saw her last -Continue prescription drug management with ASA 81mg  daily and atorvastatin 20 mg daily with as needed refills -I encouraged her to let me know if she develops any CP, SOB or decreased exercise tolerance  2.  HTN -BP is adequate controlled on exam today -Continue prescription drug management with valsartan 160 mg daily, spironolactone 25 mg daily with as needed refills -I have personally reviewed and interpreted outside labs performed by patient's PCP which showed serum creatinine 0.59 and potassium 3.7 on 05/20/2022  3.  HLD -LDL goal < 70 -I have personally reviewed and interpreted outside labs performed by patient's PCP which showed LDL  52 and HDL 60.3 ALT 18 on 05/20/2022 -Continue drug management with atorvastatin 20 mg daily with as needed refills  4.   Aortic Atherosclerosis -continue statin   Medication Adjustments/Labs and Tests Ordered: Current medicines are reviewed at length with the patient today.  Concerns regarding medicines are outlined above.  Medication changes, Labs and Tests ordered today are listed in the Patient Instructions below.  There are no Patient Instructions on file for this visit.   Signed, Melissa Magic, MD  09/20/2022 11:13 AM    [facility] [address] [place], [place], [place]  [address] Phone: [phone]; Fax: [phone]

## 2022-09-20 NOTE — Addendum Note (Signed)
Addended by: Luellen Pucker on: 09/20/2022 11:20 AM   Modules accepted: Orders

## 2022-11-15 DIAGNOSIS — J300 Vasomotor rhinitis: Secondary | ICD-10-CM | POA: Diagnosis not present

## 2022-11-15 DIAGNOSIS — R052 Subacute cough: Secondary | ICD-10-CM | POA: Diagnosis not present

## 2022-11-22 ENCOUNTER — Encounter: Payer: Medicare PPO | Admitting: Family Medicine

## 2022-11-29 ENCOUNTER — Ambulatory Visit (INDEPENDENT_AMBULATORY_CARE_PROVIDER_SITE_OTHER): Payer: Medicare PPO | Admitting: Family Medicine

## 2022-11-29 ENCOUNTER — Encounter: Payer: Self-pay | Admitting: Family Medicine

## 2022-11-29 VITALS — BP 110/70 | HR 93 | Temp 97.7°F | Resp 18 | Ht 63.0 in | Wt 124.4 lb

## 2022-11-29 DIAGNOSIS — Z23 Encounter for immunization: Secondary | ICD-10-CM | POA: Diagnosis not present

## 2022-11-29 DIAGNOSIS — Z0000 Encounter for general adult medical examination without abnormal findings: Secondary | ICD-10-CM

## 2022-11-29 DIAGNOSIS — E785 Hyperlipidemia, unspecified: Secondary | ICD-10-CM

## 2022-11-29 DIAGNOSIS — I700 Atherosclerosis of aorta: Secondary | ICD-10-CM | POA: Diagnosis not present

## 2022-11-29 DIAGNOSIS — M810 Age-related osteoporosis without current pathological fracture: Secondary | ICD-10-CM | POA: Diagnosis not present

## 2022-11-29 DIAGNOSIS — I10 Essential (primary) hypertension: Secondary | ICD-10-CM

## 2022-11-29 LAB — CBC WITH DIFFERENTIAL/PLATELET
Basophils Absolute: 0 10*3/uL (ref 0.0–0.1)
Basophils Relative: 0.9 % (ref 0.0–3.0)
Eosinophils Absolute: 0.2 10*3/uL (ref 0.0–0.7)
Eosinophils Relative: 3.4 % (ref 0.0–5.0)
HCT: 38.7 % (ref 36.0–46.0)
Hemoglobin: 13.3 g/dL (ref 12.0–15.0)
Lymphocytes Relative: 29.5 % (ref 12.0–46.0)
Lymphs Abs: 1.6 10*3/uL (ref 0.7–4.0)
MCHC: 34.4 g/dL (ref 30.0–36.0)
MCV: 88.1 fL (ref 78.0–100.0)
Monocytes Absolute: 0.3 10*3/uL (ref 0.1–1.0)
Monocytes Relative: 6.1 % (ref 3.0–12.0)
Neutro Abs: 3.2 10*3/uL (ref 1.4–7.7)
Neutrophils Relative %: 60.1 % (ref 43.0–77.0)
Platelets: 208 10*3/uL (ref 150.0–400.0)
RBC: 4.39 Mil/uL (ref 3.87–5.11)
RDW: 12.5 % (ref 11.5–15.5)
WBC: 5.3 10*3/uL (ref 4.0–10.5)

## 2022-11-29 LAB — COMPREHENSIVE METABOLIC PANEL
ALT: 17 U/L (ref 0–35)
AST: 16 U/L (ref 0–37)
Albumin: 4.5 g/dL (ref 3.5–5.2)
Alkaline Phosphatase: 63 U/L (ref 39–117)
BUN: 12 mg/dL (ref 6–23)
CO2: 29 meq/L (ref 19–32)
Calcium: 9.1 mg/dL (ref 8.4–10.5)
Chloride: 100 meq/L (ref 96–112)
Creatinine, Ser: 0.62 mg/dL (ref 0.40–1.20)
GFR: 90.71 mL/min (ref >60.00)
Glucose, Bld: 103 mg/dL — ABNORMAL HIGH (ref 70–99)
Potassium: 3.8 meq/L (ref 3.5–5.1)
Sodium: 137 meq/L (ref 135–145)
Total Bilirubin: 0.7 mg/dL (ref 0.2–1.2)
Total Protein: 6.9 g/dL (ref 6.0–8.3)

## 2022-11-29 LAB — VITAMIN D 25 HYDROXY (VIT D DEFICIENCY, FRACTURES): VITD: 23.96 ng/mL — ABNORMAL LOW (ref 30.00–100.00)

## 2022-11-29 LAB — LIPID PANEL
Cholesterol: 133 mg/dL (ref 0–200)
HDL: 71.2 mg/dL (ref >39.00)
LDL Cholesterol: 52 mg/dL (ref 0–99)
NonHDL: 62.08
Total CHOL/HDL Ratio: 2
Triglycerides: 51 mg/dL (ref 0.0–149.0)
VLDL: 10.2 mg/dL (ref 0.0–40.0)

## 2022-11-29 LAB — TSH: TSH: 1.17 u[IU]/mL (ref 0.35–5.50)

## 2022-11-29 NOTE — Assessment & Plan Note (Signed)
Ghm utd Check labs  See AVS Health Maintenance  Topic Date Due   INFLUENZA VACCINE  09/23/2022   COVID-19 Vaccine (7 - 2023-24 season) 10/24/2022   Medicare Annual Wellness (AWV)  12/29/2022   DEXA SCAN  07/30/2023   MAMMOGRAM  08/02/2023   Colonoscopy  10/27/2027   DTaP/Tdap/Td (5 - Td or Tdap) 02/02/2029   Pneumonia Vaccine 32+ Years old  Completed   Hepatitis C Screening  Completed   Zoster Vaccines- Shingrix  Completed   HPV VACCINES  Aged Out

## 2022-11-29 NOTE — Assessment & Plan Note (Signed)
Well controlled, no changes to meds. Encouraged heart healthy diet such as the DASH diet and exercise as tolerated.  °

## 2022-11-29 NOTE — Assessment & Plan Note (Signed)
Tolerating statin, encouraged heart healthy diet, avoid trans fats, minimize simple carbs and saturated fats. Increase exercise as tolerated 

## 2022-11-29 NOTE — Assessment & Plan Note (Signed)
On asa On statin

## 2022-11-29 NOTE — Progress Notes (Signed)
Established Patient Office Visit  Subjective   Patient ID: [name], female    DOB: Jul 08, 1955  Age: 67 y.o. MRN: [id number]  Chief Complaint  Patient presents with   Annual Exam    Pt states fasting     HPI Discussed the use of AI scribe software for clinical note transcription with the patient, who gave verbal consent to proceed.  History of Present Illness   The patient, a retiree, presents for a routine check-up. She reports sleeping better since retirement and has no new health complaints. She continues to experience nasal stuffiness, primarily on the right side, and uses nasal sprays for symptom management. She recently consulted with Dr. [place] Callas regarding a persistent cough, which she wondered might be related to the nasal sprays. The patient also mentions a hip discomfort that occasionally flares up after exercise, but it is manageable and does not prevent her from continuing her Jazzercise routine.  The patient is on a regimen of blood pressure medication, baby aspirin, and Lipitor for cholesterol management. She is mindful of her diet, limiting red meat intake to once or twice a month and occasionally indulging in desserts. She also maintains an active lifestyle, including regular Jazzercise workouts.      Patient Active Problem List   Diagnosis Date Noted   Aortic atherosclerosis (HCC) 05/20/2022   Lung nodules 03/31/2022   Abnormal prominence of clavicle 03/05/2022   COVID-19 virus infection 05/29/2021   Dupuytren contracture 05/07/2021   Preventative health care 11/06/2020   Coronary artery disease involving native heart with angina pectoris (HCC) 05/05/2020   Age-related osteoporosis without current pathological fracture 05/05/2020   Hyperlipidemia 05/05/2020   Jaw pain 12/23/2019   Bronchiectasis without complication (HCC)    Atherosclerosis 07/08/2019   Costochondritis 01/24/2019   Chronic cough 01/24/2019   History of cigarette smoking 01/24/2019   RUQ pain  07/29/2016   Epigastric pain 07/29/2016   Osteopenia 08/23/2014   Benign neoplasm of skin 05/07/2010   Abnormal mammogram 04/18/2008   Primary hypertension 03/24/2007   SPONTANEOUS TENSION PNEUMOTHORAX 03/24/2007   FIBROIDS, UTERUS 01/18/2007   MENORRHAGIA, PERIMENOPAUSAL 01/13/2007   Past Medical History:  Diagnosis Date   Allergy    Cataract    Chronic cough 01/24/2019   Costochondritis 01/24/2019   Epigastric pain 07/29/2016   FIBROIDS, UTERUS 01/18/2007   Qualifier: Diagnosis of  By: Janit Bern     H/O Bell's palsy 05/2010   HYPERTENSION 03/24/2007   Qualifier: Diagnosis of  By: Yetta Barre CMA, Chemira     MENORRHAGIA, PERIMENOPAUSAL 01/13/2007   Qualifier: Diagnosis of  By: Janit Bern     MOLE 05/07/2010   Qualifier: Diagnosis of  By: Janit Bern     Osteopenia 08/23/2014   RUQ pain 07/29/2016   Spontaneous pneumothorax 2005   R, s/p tube, then VATS   Spontaneous tension pneumothorax 03/24/2007   Qualifier: Diagnosis of  By: Janit Bern     Past Surgical History:  Procedure Laterality Date   BRONCHIAL WASHINGS  08/06/2019   Procedure: BRONCHIAL lavage;  Surgeon: Kalman Shan, MD;  Location: [facility];  Service: Endoscopy;;   CATARACT EXTRACTION W/ INTRAOCULAR LENS IMPLANT Bilateral    june 2023   CESAREAN SECTION     CHEST TUBE INSERTION  2005   spontaneus Pneumothorax   COLONOSCOPY     SPIROMETRY  01/20/2015   Dr. [place] Callas   VIDEO ASSISTED THORACOSCOPY  2005   VIDEO BRONCHOSCOPY N/A 08/06/2019   Procedure: VIDEO  BRONCHOSCOPY WITHOUT FLUORO;  Surgeon: Kalman Shan, MD;  Location: [facility];  Service: Endoscopy;  Laterality: N/A;   WISDOM TOOTH EXTRACTION     Social History   Tobacco Use   Smoking status: Former    Current packs/day: 0.00    Average packs/day: 1 pack/day for 14.0 years (14.0 ttl pk-yrs)    Types: Cigarettes    Start date: 07/10/1968    Quit date: 07/11/1982    Years since quitting: 40.4   Smokeless tobacco:  Never  Vaping Use   Vaping status: Never Used  Substance Use Topics   Alcohol use: Yes    Alcohol/week: 0.0 standard drinks of alcohol    Comment: rare   Drug use: No   Social History   Socioeconomic History   Marital status: Married    Spouse name: Not on file   Number of children: 3   Years of education: Not on file   Highest education level: 12th grade  Occupational History   Occupation: southern Engineer, site    Employer: [employer]  Tobacco Use   Smoking status: Former    Current packs/day: 0.00    Average packs/day: 1 pack/day for 14.0 years (14.0 ttl pk-yrs)    Types: Cigarettes    Start date: 07/10/1968    Quit date: 07/11/1982    Years since quitting: 40.4   Smokeless tobacco: Never  Vaping Use   Vaping status: Never Used  Substance and Sexual Activity   Alcohol use: Yes    Alcohol/week: 0.0 standard drinks of alcohol    Comment: rare   Drug use: No   Sexual activity: Yes    Partners: Male    Birth control/protection: Post-menopausal    Comment: 1st intercourse 67 yo-Fewer than 5 partners  Other Topics Concern   Not on file  Social History Narrative   Exercise-- jazzersize  3x a week    Social Determinants of Health   Financial Resource Strain: Low Risk  (05/13/2022)   Overall Financial Resource Strain (CARDIA)    Difficulty of Paying Living Expenses: Not hard at all  Food Insecurity: No Food Insecurity (05/13/2022)   Hunger Vital Sign    Worried About Running Out of Food in the Last Year: Never true    Ran Out of Food in the Last Year: Never true  Transportation Needs: No Transportation Needs (05/13/2022)   PRAPARE - Administrator, Civil Service (Medical): No    Lack of Transportation (Non-Medical): No  Physical Activity: Sufficiently Active (05/13/2022)   Exercise Vital Sign    Days of Exercise per Week: 6 days    Minutes of Exercise per Session: 60 min  Stress: No Stress Concern Present (05/13/2022)   [employer] of Occupational Health - Occupational Stress Questionnaire    Feeling of Stress : Only a little  Social Connections: Socially Integrated (05/13/2022)   Social Connection and Isolation Panel [NHANES]    Frequency of Communication with Friends and Family: Twice a week    Frequency of Social Gatherings with Friends and Family: Once a week    Attends Religious Services: 1 to 4 times per year    Active Member of [employer] or Organizations: Yes    Attends Engineer, structural: More than 4 times per year    Marital Status: Married  Catering manager Violence: Not At Risk (12/28/2021)   Humiliation, Afraid, Rape, and Kick questionnaire    Fear of Current or Ex-Partner: No    Emotionally Abused:  No    Physically Abused: No    Sexually Abused: No   Family Status  Relation Name Status   Mother  Deceased at age 80       CVA   Father  Deceased at age 81       AMI   MGM  Deceased at age 61       metastatic breast cancer   Daughter  Alive   Daughter  Alive   Daughter  Alive   Neg Hx  (Not Specified)  No partnership data on file   Family History  Problem Relation Age of Onset   Hypertension Mother    Stroke Mother    Heart disease Father    Hypertension Father    Cancer Maternal Grandmother 18       breast   Diabetes Maternal Grandmother    Colon cancer Neg Hx    Esophageal cancer Neg Hx    Rectal cancer Neg Hx    Stomach cancer Neg Hx    No Known Allergies    Review of Systems  Constitutional:  Negative for chills, fever and malaise/fatigue.  HENT:  Negative for congestion and hearing loss.   Eyes:  Negative for blurred vision and discharge.  Respiratory:  Negative for cough, sputum production and shortness of breath.   Cardiovascular:  Negative for chest pain, palpitations and leg swelling.  Gastrointestinal:  Negative for abdominal pain, blood in stool, constipation, diarrhea, heartburn, nausea and vomiting.  Genitourinary:  Negative for dysuria, frequency,  hematuria and urgency.  Musculoskeletal:  Negative for back pain, falls and myalgias.  Skin:  Negative for rash.  Neurological:  Negative for dizziness, sensory change, loss of consciousness, weakness and headaches.  Endo/Heme/Allergies:  Negative for environmental allergies. Does not bruise/bleed easily.  Psychiatric/Behavioral:  Negative for depression and suicidal ideas. The patient is not nervous/anxious and does not have insomnia.       Objective:     BP 110/70 (BP Location: Left Arm, Patient Position: Sitting, Cuff Size: Normal)   Pulse 93   Temp 97.7 F (36.5 C) (Oral)   Resp 18   Ht 5\' 3"  (1.6 m)   Wt 124 lb 6.4 oz (56.4 kg)   SpO2 98%   BMI 22.04 kg/m  BP Readings from Last 3 Encounters:  11/29/22 110/70  09/20/22 130/80  07/13/22 120/80   Wt Readings from Last 3 Encounters:  11/29/22 124 lb 6.4 oz (56.4 kg)  09/20/22 128 lb (58.1 kg)  07/13/22 127 lb 6.4 oz (57.8 kg)   SpO2 Readings from Last 3 Encounters:  11/29/22 98%  09/20/22 98%  07/13/22 97%      Physical Exam Vitals and nursing note reviewed.  Constitutional:      General: She is not in acute distress.    Appearance: Normal appearance. She is well-developed.  HENT:     Head: Normocephalic and atraumatic.     Right Ear: Tympanic membrane, ear canal and external ear normal. There is no impacted cerumen.     Left Ear: Tympanic membrane, ear canal and external ear normal. There is no impacted cerumen.     Nose: Nose normal.     Mouth/Throat:     Mouth: Mucous membranes are moist.     Pharynx: Oropharynx is clear. No oropharyngeal exudate or posterior oropharyngeal erythema.  Eyes:     General: No scleral icterus.       Right eye: No discharge.        Left eye: No  discharge.     Conjunctiva/sclera: Conjunctivae normal.     Pupils: Pupils are equal, round, and reactive to light.  Neck:     Thyroid: No thyromegaly or thyroid tenderness.     Vascular: No JVD.  Cardiovascular:     Rate and Rhythm:  Normal rate and regular rhythm.     Heart sounds: Normal heart sounds. No murmur heard. Pulmonary:     Effort: Pulmonary effort is normal. No respiratory distress.     Breath sounds: Normal breath sounds.  Abdominal:     General: Bowel sounds are normal. There is no distension.     Palpations: Abdomen is soft. There is no mass.     Tenderness: There is no abdominal tenderness. There is no guarding or rebound.  Genitourinary:    Vagina: Normal.  Musculoskeletal:        General: Normal range of motion.     Cervical back: Normal range of motion and neck supple.     Right lower leg: No edema.     Left lower leg: No edema.  Lymphadenopathy:     Cervical: No cervical adenopathy.  Skin:    General: Skin is warm and dry.     Findings: No erythema or rash.  Neurological:     Mental Status: She is alert and oriented to person, place, and time.     Cranial Nerves: No cranial nerve deficit.     Deep Tendon Reflexes: Reflexes are normal and symmetric.  Psychiatric:        Mood and Affect: Mood normal.        Behavior: Behavior normal.        Thought Content: Thought content normal.        Judgment: Judgment normal.     No results found for any visits on 11/29/22.  Last CBC Lab Results  Component Value Date   WBC 3.5 (L) 05/20/2022   HGB 12.4 05/20/2022   HCT 35.2 (L) 05/20/2022   MCV 87.4 05/20/2022   MCH 31.0 01/24/2021   RDW 12.2 05/20/2022   PLT 176.0 05/20/2022   Last metabolic panel Lab Results  Component Value Date   GLUCOSE 91 05/20/2022   NA 136 05/20/2022   K 3.7 05/20/2022   CL 101 05/20/2022   CO2 29 05/20/2022   BUN 10 05/20/2022   CREATININE 0.59 05/20/2022   GFR 92.14 05/20/2022   CALCIUM 8.4 05/20/2022   PROT 6.4 05/20/2022   ALBUMIN 4.2 05/20/2022   BILITOT 0.6 05/20/2022   ALKPHOS 60 05/20/2022   AST 15 05/20/2022   ALT 18 05/20/2022   ANIONGAP 8 01/24/2021   Last lipids Lab Results  Component Value Date   CHOL 128 05/20/2022   HDL 60.30  05/20/2022   LDLCALC 52 05/20/2022   LDLDIRECT 111.7 05/07/2010   TRIG 78.0 05/20/2022   CHOLHDL 2 05/20/2022   Last hemoglobin A1c No results found for: "HGBA1C" Last thyroid functions Lab Results  Component Value Date   TSH 0.50 10/21/2017   Last vitamin D No results found for: "25OHVITD2", "25OHVITD3", "VD25OH" Last vitamin B12 and Folate No results found for: "VITAMINB12", "FOLATE"    The ASCVD Risk score (Arnett DK, et al., 2019) failed to calculate for the following reasons:   The valid total cholesterol range is 130 to 320 mg/dL    Assessment & Plan:   Problem List Items Addressed This Visit       Unprioritized   Age-related osteoporosis without current pathological fracture   Relevant  Orders   VITAMIN D 25 Hydroxy (Vit-D Deficiency, Fractures)   Primary hypertension    Well controlled, no changes to meds. Encouraged heart healthy diet such as the DASH diet and exercise as tolerated.        Relevant Orders   CBC with Differential/Platelet   Comprehensive metabolic panel   TSH   Preventative health care - Primary    Ghm utd Check labs  See AVS Health Maintenance  Topic Date Due   INFLUENZA VACCINE  09/23/2022   COVID-19 Vaccine (7 - 2023-24 season) 10/24/2022   Medicare Annual Wellness (AWV)  12/29/2022   DEXA SCAN  07/30/2023   MAMMOGRAM  08/02/2023   Colonoscopy  10/27/2027   DTaP/Tdap/Td (5 - Td or Tdap) 02/02/2029   Pneumonia Vaccine 35+ Years old  Completed   Hepatitis C Screening  Completed   Zoster Vaccines- Shingrix  Completed   HPV VACCINES  Aged Out         Hyperlipidemia    Tolerating statin, encouraged heart healthy diet, avoid trans fats, minimize simple carbs and saturated fats. Increase exercise as tolerated       Relevant Orders   CBC with Differential/Platelet   Comprehensive metabolic panel   Lipid panel   Aortic atherosclerosis (HCC)    On asa On statin      Other Visit Diagnoses     Need for influenza vaccination        Relevant Orders   Flu Vaccine Trivalent High Dose (Fluad) (Completed)     Assessment and Plan    Chronic Nasal Congestion Persistent despite use of nasal sprays. Discussed with Dr. [place] Callas who suggested a trial off nasal sprays to assess for improvement in cough. -Continue current regimen as per Dr. Lyla Son advice.  Hyperlipidemia On Lipitor for cholesterol management. Fasting labs to be done today. -Continue Lipitor as prescribed. -Check fasting lipid panel today.  Vitamin D Deficiency Discussed the need for Vitamin D supplementation. -Start Vitamin D3 2000 units daily. -Check Vitamin D level today.  General Health Maintenance -Administer influenza vaccine today. -Continue regular eye and dental check-ups.        Return in about 6 months (around 05/30/2023), or if symptoms worsen or fail to improve.    Donato Schultz, DO

## 2022-11-29 NOTE — Patient Instructions (Signed)
Preventive Care 65 Years and Older, Female Preventive care refers to lifestyle choices and visits with your health care provider that can promote health and wellness. Preventive care visits are also called wellness exams. What can I expect for my preventive care visit? Counseling Your health care provider may ask you questions about your: Medical history, including: Past medical problems. Family medical history. Pregnancy and menstrual history. History of falls. Current health, including: Memory and ability to understand (cognition). Emotional well-being. Home life and relationship well-being. Sexual activity and sexual health. Lifestyle, including: Alcohol, nicotine or tobacco, and drug use. Access to firearms. Diet, exercise, and sleep habits. Work and work environment. Sunscreen use. Safety issues such as seatbelt and bike helmet use. Physical exam Your health care provider will check your: Height and weight. These may be used to calculate your BMI (body mass index). BMI is a measurement that tells if you are at a healthy weight. Waist circumference. This measures the distance around your waistline. This measurement also tells if you are at a healthy weight and may help predict your risk of certain diseases, such as type 2 diabetes and high blood pressure. Heart rate and blood pressure. Body temperature. Skin for abnormal spots. What immunizations do I need?  Vaccines are usually given at various ages, according to a schedule. Your health care provider will recommend vaccines for you based on your age, medical history, and lifestyle or other factors, such as travel or where you work. What tests do I need? Screening Your health care provider may recommend screening tests for certain conditions. This may include: Lipid and cholesterol levels. Hepatitis C test. Hepatitis B test. HIV (human immunodeficiency virus) test. STI (sexually transmitted infection) testing, if you are at  risk. Lung cancer screening. Colorectal cancer screening. Diabetes screening. This is done by checking your blood sugar (glucose) after you have not eaten for a while (fasting). Mammogram. Talk with your health care provider about how often you should have regular mammograms. BRCA-related cancer screening. This may be done if you have a family history of breast, ovarian, tubal, or peritoneal cancers. Bone density scan. This is done to screen for osteoporosis. Talk with your health care provider about your test results, treatment options, and if necessary, the need for more tests. Follow these instructions at home: Eating and drinking  Eat a diet that includes fresh fruits and vegetables, whole grains, lean protein, and low-fat dairy products. Limit your intake of foods with high amounts of sugar, saturated fats, and salt. Take vitamin and mineral supplements as recommended by your health care provider. Do not drink alcohol if your health care provider tells you not to drink. If you drink alcohol: Limit how much you have to 0-1 drink a day. Know how much alcohol is in your drink. In the [place] one drink equals one 12 oz bottle of beer (355 mL), one 5 oz glass of wine (148 mL), or one 1 oz glass of hard liquor (44 mL). Lifestyle Brush your teeth every morning and night with fluoride toothpaste. Floss one time each day. Exercise for at least 30 minutes 5 or more days each week. Do not use any products that contain nicotine or tobacco. These products include cigarettes, chewing tobacco, and vaping devices, such as e-cigarettes. If you need help quitting, ask your health care provider. Do not use drugs. If you are sexually active, practice safe sex. Use a condom or other form of protection in order to prevent STIs. Take aspirin only as told by   your health care provider. Make sure that you understand how much to take and what form to take. Work with your health care provider to find out whether it  is safe and beneficial for you to take aspirin daily. Ask your health care provider if you need to take a cholesterol-lowering medicine (statin). Find healthy ways to manage stress, such as: Meditation, yoga, or listening to music. Journaling. Talking to a trusted person. Spending time with friends and family. Minimize exposure to UV radiation to reduce your risk of skin cancer. Safety Always wear your seat belt while driving or riding in a vehicle. Do not drive: If you have been drinking alcohol. Do not ride with someone who has been drinking. When you are tired or distracted. While texting. If you have been using any mind-altering substances or drugs. Wear a helmet and other protective equipment during sports activities. If you have firearms in your house, make sure you follow all gun safety procedures. What's next? Visit your health care provider once a year for an annual wellness visit. Ask your health care provider how often you should have your eyes and teeth checked. Stay up to date on all vaccines. This information is not intended to replace advice given to you by your health care provider. Make sure you discuss any questions you have with your health care provider. Document Revised: 08/06/2020 Document Reviewed: 08/06/2020 Elsevier Patient Education  2024 [employer]  

## 2022-12-02 ENCOUNTER — Other Ambulatory Visit: Payer: Self-pay

## 2022-12-02 MED ORDER — VITAMIN D (ERGOCALCIFEROL) 1.25 MG (50000 UNIT) PO CAPS: 50000.0000 [IU] | ORAL_CAPSULE | ORAL | 1 refills | Status: DC

## 2023-01-04 ENCOUNTER — Encounter: Payer: Self-pay | Admitting: Family Medicine

## 2023-01-05 ENCOUNTER — Ambulatory Visit: Payer: Medicare PPO

## 2023-01-05 DIAGNOSIS — Z0000 Encounter for general adult medical examination without abnormal findings: Secondary | ICD-10-CM

## 2023-01-05 NOTE — Patient Instructions (Signed)
[name] , Thank you for taking time to come for your Medicare Wellness Visit. I appreciate your ongoing commitment to your health goals. Please review the following plan we discussed and let me know if I can assist you in the future.    This is a list of the screening recommended for you and due dates:  Health Maintenance  Topic Date Due   COVID-19 Vaccine (7 - 2023-24 season) 10/24/2022   DEXA scan (bone density measurement)  07/30/2023   Mammogram  08/02/2023   Medicare Annual Wellness Visit  01/05/2024   Colon Cancer Screening  10/27/2027   DTaP/Tdap/Td vaccine (5 - Td or Tdap) 02/02/2029   Pneumonia Vaccine  Completed   Flu Shot  Completed   Hepatitis C Screening  Completed   Zoster (Shingles) Vaccine  Completed   HPV Vaccine  Aged Out    Next appointment: Follow up in one year for your annual wellness visit.   Preventive Care 69 Years and Older, Female Preventive care refers to lifestyle choices and visits with your health care provider that can promote health and wellness. What does preventive care include? A yearly physical exam. This is also called an annual well check. Dental exams once or twice a year. Routine eye exams. Ask your health care provider how often you should have your eyes checked. Personal lifestyle choices, including: Daily care of your teeth and gums. Regular physical activity. Eating a healthy diet. Avoiding tobacco and drug use. Limiting alcohol use. Practicing safe sex. Taking low-dose aspirin every day. Taking vitamin and mineral supplements as recommended by your health care provider. What happens during an annual well check? The services and screenings done by your health care provider during your annual well check will depend on your age, overall health, lifestyle risk factors, and family history of disease. Counseling  Your health care provider may ask you questions about your: Alcohol use. Tobacco use. Drug use. Emotional  well-being. Home and relationship well-being. Sexual activity. Eating habits. History of falls. Memory and ability to understand (cognition). Work and work Astronomer. Reproductive health. Screening  You may have the following tests or measurements: Height, weight, and BMI. Blood pressure. Lipid and cholesterol levels. These may be checked every 5 years, or more frequently if you are over 62 years old. Skin check. Lung cancer screening. You may have this screening every year starting at age 16 if you have a 30-pack-year history of smoking and currently smoke or have quit within the past 15 years. Fecal occult blood test (FOBT) of the stool. You may have this test every year starting at age 4. Flexible sigmoidoscopy or colonoscopy. You may have a sigmoidoscopy every 5 years or a colonoscopy every 10 years starting at age 47. Hepatitis C blood test. Hepatitis B blood test. Sexually transmitted disease (STD) testing. Diabetes screening. This is done by checking your blood sugar (glucose) after you have not eaten for a while (fasting). You may have this done every 1-3 years. Bone density scan. This is done to screen for osteoporosis. You may have this done starting at age 80. Mammogram. This may be done every 1-2 years. Talk to your health care provider about how often you should have regular mammograms. Talk with your health care provider about your test results, treatment options, and if necessary, the need for more tests. Vaccines  Your health care provider may recommend certain vaccines, such as: Influenza vaccine. This is recommended every year. Tetanus, diphtheria, and acellular pertussis (Tdap, Td) vaccine. You  may need a Td booster every 10 years. Zoster vaccine. You may need this after age 35. Pneumococcal 13-valent conjugate (PCV13) vaccine. One dose is recommended after age 37. Pneumococcal polysaccharide (PPSV23) vaccine. One dose is recommended after age 19. Talk to your  health care provider about which screenings and vaccines you need and how often you need them. This information is not intended to replace advice given to you by your health care provider. Make sure you discuss any questions you have with your health care provider. Document Released: 03/07/2015 Document Revised: 10/29/2015 Document Reviewed: 12/10/2014 Elsevier Interactive Patient Education  2017 [employer].  Fall Prevention in the Home Falls can cause injuries. They can happen to people of all ages. There are many things you can do to make your home safe and to help prevent falls. What can I do on the outside of my home? Regularly fix the edges of walkways and driveways and fix any cracks. Remove anything that might make you trip as you walk through a door, such as a raised step or threshold. Trim any bushes or trees on the path to your home. Use bright outdoor lighting. Clear any walking paths of anything that might make someone trip, such as rocks or tools. Regularly check to see if handrails are loose or broken. Make sure that both sides of any steps have handrails. Any raised decks and porches should have guardrails on the edges. Have any leaves, snow, or ice cleared regularly. Use sand or salt on walking paths during winter. Clean up any spills in your garage right away. This includes oil or grease spills. What can I do in the bathroom? Use night lights. Install grab bars by the toilet and in the tub and shower. Do not use towel bars as grab bars. Use non-skid mats or decals in the tub or shower. If you need to sit down in the shower, use a plastic, non-slip stool. Keep the floor dry. Clean up any water that spills on the floor as soon as it happens. Remove soap buildup in the tub or shower regularly. Attach bath mats securely with double-sided non-slip rug tape. Do not have throw rugs and other things on the floor that can make you trip. What can I do in the bedroom? Use night  lights. Make sure that you have a light by your bed that is easy to reach. Do not use any sheets or blankets that are too big for your bed. They should not hang down onto the floor. Have a firm chair that has side arms. You can use this for support while you get dressed. Do not have throw rugs and other things on the floor that can make you trip. What can I do in the kitchen? Clean up any spills right away. Avoid walking on wet floors. Keep items that you use a lot in easy-to-reach places. If you need to reach something above you, use a strong step stool that has a grab bar. Keep electrical cords out of the way. Do not use floor polish or wax that makes floors slippery. If you must use wax, use non-skid floor wax. Do not have throw rugs and other things on the floor that can make you trip. What can I do with my stairs? Do not leave any items on the stairs. Make sure that there are handrails on both sides of the stairs and use them. Fix handrails that are broken or loose. Make sure that handrails are as long as the  stairways. Check any carpeting to make sure that it is firmly attached to the stairs. Fix any carpet that is loose or worn. Avoid having throw rugs at the top or bottom of the stairs. If you do have throw rugs, attach them to the floor with carpet tape. Make sure that you have a light switch at the top of the stairs and the bottom of the stairs. If you do not have them, ask someone to add them for you. What else can I do to help prevent falls? Wear shoes that: Do not have high heels. Have rubber bottoms. Are comfortable and fit you well. Are closed at the toe. Do not wear sandals. If you use a stepladder: Make sure that it is fully opened. Do not climb a closed stepladder. Make sure that both sides of the stepladder are locked into place. Ask someone to hold it for you, if possible. Clearly mark and make sure that you can see: Any grab bars or handrails. First and last  steps. Where the edge of each step is. Use tools that help you move around (mobility aids) if they are needed. These include: Canes. Walkers. Scooters. Crutches. Turn on the lights when you go into a dark area. Replace any light bulbs as soon as they burn out. Set up your furniture so you have a clear path. Avoid moving your furniture around. If any of your floors are uneven, fix them. If there are any pets around you, be aware of where they are. Review your medicines with your doctor. Some medicines can make you feel dizzy. This can increase your chance of falling. Ask your doctor what other things that you can do to help prevent falls. This information is not intended to replace advice given to you by your health care provider. Make sure you discuss any questions you have with your health care provider. Document Released: 12/05/2008 Document Revised: 07/17/2015 Document Reviewed: 03/15/2014 Elsevier Interactive Patient Education  2017 [employer].

## 2023-01-05 NOTE — Progress Notes (Signed)
Subjective:   [name] is a 67 y.o. female who presents for Medicare Annual (Subsequent) preventive examination.  Visit Complete: Virtual I connected with  [name] on 01/05/23 by a audio enabled telemedicine application and verified that I am speaking with the correct person using two identifiers.  Patient Location: Home  Provider Location: Office/Clinic  I discussed the limitations of evaluation and management by telemedicine. The patient expressed understanding and agreed to proceed.  Vital Signs: Because this visit was a virtual/telehealth visit, some criteria may be missing or patient reported. Any vitals not documented were not able to be obtained and vitals that have been documented are patient reported.  Patient Medicare AWV questionnaire was completed by the patient on 01/04/23; I have confirmed that all information answered by patient is correct and no changes since this date.  Cardiac Risk Factors include: advanced age (>71men, >52 women);dyslipidemia;hypertension     Objective:    There were no vitals filed for this visit. There is no height or weight on file to calculate BMI.     01/05/2023    8:22 AM 12/28/2021   10:21 AM 01/24/2021    9:10 PM 12/24/2020   11:04 AM 08/06/2019    6:27 AM 11/03/2018    1:26 PM  Advanced Directives  Does Patient Have a Medical Advance Directive? Yes Yes Yes No Yes Yes  Type of Advance Directive Living will Living will   Healthcare Power of [place];Living will Living will  Does patient want to make changes to medical advance directive?  No - Patient declined  Yes (MAU/Ambulatory/Procedural Areas - Information given)    Copy of Healthcare Power of Attorney in Chart?     Yes - validated most recent copy scanned in chart (See row information)     Current Medications (verified) Outpatient Encounter Medications as of 01/05/2023  Medication Sig   acetaminophen (TYLENOL) 500 MG tablet Take 500 mg by mouth every 6 (six) hours as  needed for moderate pain or headache.   albuterol (VENTOLIN HFA) 108 (90 Base) MCG/ACT inhaler Inhale 1-2 puffs into the lungs every 6 (six) hours as needed for wheezing or shortness of breath.   aspirin EC 81 MG tablet Take 81 mg by mouth daily. Swallow whole.   atorvastatin (LIPITOR) 20 MG tablet Take 1 tablet (20 mg total) by mouth daily.   azelastine (ASTELIN) 0.1 % nasal spray Place 1 spray into both nostrils 2 (two) times daily.   denosumab (PROLIA) 60 MG/ML SOSY injection Inject 60 mg into the skin every 6 (six) months.   FIBER PO Take by mouth daily. Either Benefiber or Metamucil   fluticasone (FLONASE) 50 MCG/ACT nasal spray Place 1 spray into both nostrils daily.   gabapentin (NEURONTIN) 300 MG capsule TAKE 1 CAPSULE BY MOUTH MONDAY, WEDNESDAY, AND FRIDAY   levocetirizine (XYZAL) 5 MG tablet Take 5 mg by mouth every evening.    montelukast (SINGULAIR) 10 MG tablet Take 10 mg by mouth at bedtime.   nitroGLYCERIN (NITROSTAT) 0.4 MG SL tablet Place 1 tablet (0.4 mg total) under the tongue every 5 (five) minutes as needed for chest pain.   spironolactone (ALDACTONE) 25 MG tablet Take 1 tablet (25 mg total) by mouth once for 1 dose.   UNABLE TO FIND Take by mouth daily. Med Name: ViActiv   valACYclovir (VALTREX) 1000 MG tablet Take 1 tablet (1,000 mg total) by mouth 3 (three) times daily.   valACYclovir (VALTREX) 500 MG tablet Take 1 tablet po bid  x 5 days as needed for outbreak (Patient not taking: Reported on 09/20/2022)   valsartan (DIOVAN) 160 MG tablet Take 1 tablet (160 mg total) by mouth daily.   Vitamin D, Ergocalciferol, (DRISDOL) 1.25 MG (50000 UNIT) CAPS capsule Take 1 capsule (50,000 Units total) by mouth every 7 (seven) days.   Facility-Administered Encounter Medications as of 01/05/2023  Medication   gi cocktail (Maalox,Lidocaine,Donnatal)    Allergies (verified) Patient has no known allergies.   History: Past Medical History:  Diagnosis Date   Allergy    Cataract     Chronic cough 01/24/2019   Costochondritis 01/24/2019   Emphysema of lung (HCC) 2020   Mild emphysema   Epigastric pain 07/29/2016   FIBROIDS, UTERUS 01/18/2007   Qualifier: Diagnosis of  By: Janit Bern     H/O Bell's palsy 05/2010   HYPERTENSION 03/24/2007   Qualifier: Diagnosis of  By: Yetta Barre CMA, Chemira     MENORRHAGIA, PERIMENOPAUSAL 01/13/2007   Qualifier: Diagnosis of  By: Janit Bern     MOLE 05/07/2010   Qualifier: Diagnosis of  By: Janit Bern     Osteopenia 08/23/2014   Osteoporosis 07/2019   RUQ pain 07/29/2016   Spontaneous pneumothorax 2005   R, s/p tube, then VATS   Spontaneous tension pneumothorax 03/24/2007   Qualifier: Diagnosis of  By: Janit Bern     Past Surgical History:  Procedure Laterality Date   BRONCHIAL WASHINGS  08/06/2019   Procedure: BRONCHIAL lavage;  Surgeon: Kalman Shan, MD;  Location: [facility];  Service: Endoscopy;;   CATARACT EXTRACTION W/ INTRAOCULAR LENS IMPLANT Bilateral    june 2023   CESAREAN SECTION     CHEST TUBE INSERTION  2005   spontaneus Pneumothorax   COLONOSCOPY     EYE SURGERY  07/2020   Cararacts   SPIROMETRY  01/20/2015   Dr. Knowles Callas   VIDEO ASSISTED THORACOSCOPY  2005   VIDEO BRONCHOSCOPY N/A 08/06/2019   Procedure: VIDEO BRONCHOSCOPY WITHOUT FLUORO;  Surgeon: Kalman Shan, MD;  Location: [facility];  Service: Endoscopy;  Laterality: N/A;   WISDOM TOOTH EXTRACTION     Family History  Problem Relation Age of Onset   Hypertension Mother    Stroke Mother    Heart disease Father    Hypertension Father    Arthritis Father    Cancer Maternal Grandmother 17       breast   Diabetes Maternal Grandmother    COPD Maternal Grandfather    Colon cancer Neg Hx    Esophageal cancer Neg Hx    Rectal cancer Neg Hx    Stomach cancer Neg Hx    Social History   Socioeconomic History   Marital status: Married    Spouse name: Not on file   Number of children: 3   Years of education: Not on  file   Highest education level: 12th grade  Occupational History   Occupation: southern Engineer, site    Employer: [employer]  Tobacco Use   Smoking status: Former    Current packs/day: 0.00    Average packs/day: 1 pack/day for 14.0 years (14.0 ttl pk-yrs)    Types: Cigarettes    Start date: 07/10/1968    Quit date: 07/11/1982    Years since quitting: 40.5   Smokeless tobacco: Never  Vaping Use   Vaping status: Never Used  Substance and Sexual Activity   Alcohol use: Yes    Alcohol/week: 0.0 standard drinks of alcohol    Comment:  rare   Drug use: No   Sexual activity: Yes    Partners: Male    Birth control/protection: Post-menopausal    Comment: 1st intercourse 67 yo-Fewer than 5 partners  Other Topics Concern   Not on file  Social History Narrative   Exercise-- jazzersize  3x a week    Social Determinants of Health   Financial Resource Strain: Low Risk  (01/04/2023)   Overall Financial Resource Strain (CARDIA)    Difficulty of Paying Living Expenses: Not very hard  Food Insecurity: No Food Insecurity (01/04/2023)   Hunger Vital Sign    Worried About Running Out of Food in the Last Year: Never true    Ran Out of Food in the Last Year: Never true  Transportation Needs: No Transportation Needs (01/04/2023)   PRAPARE - Administrator, Civil Service (Medical): No    Lack of Transportation (Non-Medical): No  Physical Activity: Sufficiently Active (01/04/2023)   Exercise Vital Sign    Days of Exercise per Week: 5 days    Minutes of Exercise per Session: 60 min  Stress: No Stress Concern Present (01/04/2023)   [employer] of Occupational Health - Occupational Stress Questionnaire    Feeling of Stress : Only a little  Social Connections: Moderately Integrated (01/04/2023)   Social Connection and Isolation Panel [NHANES]    Frequency of Communication with Friends and Family: Once a week    Frequency of Social Gatherings with Friends  and Family: Once a week    Attends Religious Services: 1 to 4 times per year    Active Member of [employer] or Organizations: Yes    Attends Engineer, structural: More than 4 times per year    Marital Status: Married    Tobacco Counseling Counseling given: Not Answered   Clinical Intake:  Pre-visit preparation completed: Yes  Pain : No/denies pain  Nutritional Risks: None Diabetes: No  How often do you need to have someone help you when you read instructions, pamphlets, or other written materials from your doctor or pharmacy?: 1 - Never  Interpreter Needed?: No  Information entered by :: [employer], CMA   Activities of Daily Living    01/04/2023    3:31 PM 12/28/2022   12:25 PM  In your present state of health, do you have any difficulty performing the following activities:  Hearing? 0 0  Vision? 0 0  Difficulty concentrating or making decisions? 0 0  Walking or climbing stairs? 0 0  Dressing or bathing? 0 0  Doing errands, shopping? 0 0  Preparing Food and eating ? N N  Using the Toilet? N N  In the past six months, have you accidently leaked urine? Y Y  Do you have problems with loss of bowel control? N N  Managing your Medications? N N  Managing your Finances? N N  Housekeeping or managing your Housekeeping? N N    Patient Care Team: Zola Button, Grayling Congress, DO as PCP - General [name], MD as PCP - Cardiology (Cardiology) Sidney Ace, MD as Referring Physician (Allergy) Kalman Shan, MD as Consulting Physician (Pulmonary Disease) Elmon Else, MD as Consulting Physician (Dermatology)  Indicate any recent Medical Services you may have received from other than [facility] providers in the past year (date may be approximate).     Assessment:   This is a routine wellness examination for [place]  Hearing/Vision screen No results found.   Goals Addressed   None    Depression Screen  01/05/2023    8:24 AM 11/29/2022    8:25 AM 12/28/2021    10:24 AM 11/19/2021    9:15 AM 12/24/2020   11:10 AM 11/06/2020   10:20 AM 11/05/2019    1:01 PM  PHQ 2/9 Scores  PHQ - 2 Score 0 0 0 0 0 0 0    Fall Risk    01/04/2023    3:31 PM 12/28/2022   12:25 PM 11/29/2022    8:25 AM 05/20/2022   10:01 AM 12/28/2021   10:24 AM  Fall Risk   Falls in the past year? 0 0 0 0 0  Number falls in past yr: 0  0 0 0  Injury with Fall? 0  0 0 0  Risk for fall due to : No Fall Risks    No Fall Risks  Follow up Falls evaluation completed  Falls evaluation completed Falls evaluation completed Falls evaluation completed    MEDICARE RISK AT HOME: Medicare Risk at Home Any stairs in or around the home?: Yes If so, are there any without handrails?: No Home free of loose throw rugs in walkways, pet beds, electrical cords, etc?: Yes Adequate lighting in your home to reduce risk of falls?: Yes Life alert?: No Use of a cane, walker or w/c?: No Grab bars in the bathroom?: Yes Shower chair or bench in shower?: No Elevated toilet seat or a handicapped toilet?: Yes  TIMED UP AND GO:  Was the test performed?  No    Cognitive Function:    11/03/2018    1:24 PM  MMSE - Mini Mental State Exam  Orientation to time 5  Orientation to Place 5  Registration 3  Attention/ Calculation 5  Recall 3  Language- name 2 objects 2  Language- repeat 1  Language- follow 3 step command 3  Language- read & follow direction 1  Write a sentence 1  Copy design 1  Total score 30        01/05/2023    8:25 AM 12/28/2021   10:53 AM  6CIT Screen  What Year? 0 points 0 points  What month? 0 points 0 points  What time? 0 points 0 points  Count back from 20 0 points 0 points  Months in reverse 0 points 0 points  Repeat phrase 0 points 0 points  Total Score 0 points 0 points    Immunizations Immunization History  Administered Date(s) Administered   DTaP 02/03/2019   Fluad Quad(high Dose 65+) 11/03/2018, 11/06/2020, 11/19/2021   Fluad Trivalent(High Dose 65+)  11/29/2022   Influenza Whole 11/30/2006, 12/13/2007, 01/01/2009, 12/31/2009   Influenza, High Dose Seasonal PF 04/19/2016, 04/12/2017, 03/14/2018, 04/30/2019   Influenza,inj,Quad PF,6+ Mos 11/27/2017   Influenza-Unspecified 10/24/2013, 11/28/2016, 11/23/2019   PFIZER(Purple Top)SARS-COV-2 Vaccination 03/31/2019, 04/25/2019, 11/23/2019, 09/01/2020, 12/03/2020   Pfizer Covid-19 Vaccine Bivalent Booster 75yrs & up 11/23/2021   Pneumococcal Conjugate-13 11/03/2018   Pneumococcal Polysaccharide-23 04/12/2017, 03/14/2018, 04/30/2019, 11/05/2019, 11/04/2020   Respiratory Syncytial Virus Vaccine,Recomb Aduvanted(Arexvy) 12/07/2021   Td 08/28/2002, 04/18/2008   Tdap 01/24/2019   Zoster Recombinant(Shingrix) 11/28/2016, 02/04/2017   Zoster, Live 08/20/2013    TDAP status: Up to date  Flu Vaccine status: Up to date  Pneumococcal vaccine status: Up to date  Covid-19 vaccine status: Information provided on how to obtain vaccines.   Qualifies for Shingles Vaccine? Yes   Zostavax completed Yes   Shingrix Completed?: Yes  Screening Tests Health Maintenance  Topic Date Due   COVID-19 Vaccine (7 - 2023-24 season) 10/24/2022  Medicare Annual Wellness (AWV)  12/29/2022   DEXA SCAN  07/30/2023   MAMMOGRAM  08/02/2023   Colonoscopy  10/27/2027   DTaP/Tdap/Td (5 - Td or Tdap) 02/02/2029   Pneumonia Vaccine 73+ Years old  Completed   INFLUENZA VACCINE  Completed   Hepatitis C Screening  Completed   Zoster Vaccines- Shingrix  Completed   HPV VACCINES  Aged Out    Health Maintenance  Health Maintenance Due  Topic Date Due   COVID-19 Vaccine (7 - 2023-24 season) 10/24/2022   Medicare Annual Wellness (AWV)  12/29/2022    Colorectal cancer screening: Type of screening: Colonoscopy. Completed 10/26/17. Repeat every 10 years  Mammogram status: Completed 08/02/22. Repeat every year  Bone Density status: Completed 07/29/21. Results reflect: Bone density results: OSTEOPOROSIS. Repeat every 2  years.  Lung Cancer Screening: (Low Dose CT Chest recommended if Age 66-80 years, 20 pack-year currently smoking OR have quit w/in 15years.) does not qualify.   Additional Screening:  Hepatitis C Screening: does qualify; Completed 09/23/15  Vision Screening: Recommended annual ophthalmology exams for early detection of glaucoma and other disorders of the eye. Is the patient up to date with their annual eye exam?  Yes  Who is the provider or what is the name of the office in which the patient attends annual eye exams? Dr. Martha Clan If pt is not established with a provider, would they like to be referred to a provider to establish care? No .   Dental Screening: Recommended annual dental exams for proper oral hygiene  Diabetic Foot Exam: N/a  Community Resource Referral / Chronic Care Management: CRR required this visit?  No   CCM required this visit?  No     Plan:     I have personally reviewed and noted the following in the patient's chart:   Medical and social history Use of alcohol, tobacco or illicit drugs  Current medications and supplements including opioid prescriptions. Patient is not currently taking opioid prescriptions. Functional ability and status Nutritional status Physical activity Advanced directives List of other physicians Hospitalizations, surgeries, and ER visits in previous 12 months Vitals Screenings to include cognitive, depression, and falls Referrals and appointments  In addition, I have reviewed and discussed with patient certain preventive protocols, quality metrics, and best practice recommendations. A written personalized care plan for preventive services as well as general preventive health recommendations were provided to patient.     Donne Anon, CMA   01/05/2023   After Visit Summary: (MyChart) Due to this being a telephonic visit, the after visit summary with patients personalized plan was offered to patient via MyChart   Nurse Notes:  None

## 2023-01-12 ENCOUNTER — Encounter: Payer: Self-pay | Admitting: Family Medicine

## 2023-01-13 ENCOUNTER — Other Ambulatory Visit: Payer: Self-pay | Admitting: Family Medicine

## 2023-01-13 DIAGNOSIS — G4709 Other insomnia: Secondary | ICD-10-CM

## 2023-01-13 MED ORDER — TRAZODONE HCL 50 MG PO TABS
25.0000 mg | ORAL_TABLET | Freq: Every evening | ORAL | 3 refills | Status: DC | PRN
Start: 2023-01-13 — End: 2023-07-27

## 2023-01-30 ENCOUNTER — Other Ambulatory Visit: Payer: Self-pay | Admitting: Cardiology

## 2023-01-30 DIAGNOSIS — I10 Essential (primary) hypertension: Secondary | ICD-10-CM

## 2023-02-01 DIAGNOSIS — H00021 Hordeolum internum right upper eyelid: Secondary | ICD-10-CM | POA: Diagnosis not present

## 2023-02-01 DIAGNOSIS — H1031 Unspecified acute conjunctivitis, right eye: Secondary | ICD-10-CM | POA: Diagnosis not present

## 2023-02-09 ENCOUNTER — Telehealth: Payer: Self-pay | Admitting: *Deleted

## 2023-02-09 DIAGNOSIS — M810 Age-related osteoporosis without current pathological fracture: Secondary | ICD-10-CM

## 2023-02-09 MED ORDER — DENOSUMAB 60 MG/ML ~~LOC~~ SOSY
60.0000 mg | PREFILLED_SYRINGE | Freq: Once | SUBCUTANEOUS | Status: AC
Start: 2023-02-18 — End: 2023-03-22
  Administered 2023-03-22: 60 mg via SUBCUTANEOUS

## 2023-02-09 NOTE — Telephone Encounter (Signed)
Prolia given 08/24/21 Next Prolia due 02/25/23  CAM order placed

## 2023-02-11 ENCOUNTER — Encounter: Payer: Self-pay | Admitting: Family Medicine

## 2023-02-11 ENCOUNTER — Other Ambulatory Visit (HOSPITAL_BASED_OUTPATIENT_CLINIC_OR_DEPARTMENT_OTHER): Payer: Self-pay

## 2023-02-11 ENCOUNTER — Ambulatory Visit: Payer: Medicare PPO | Admitting: Family Medicine

## 2023-02-11 VITALS — BP 128/80 | HR 74 | Temp 98.4°F | Resp 18 | Ht 63.0 in | Wt 116.8 lb

## 2023-02-11 DIAGNOSIS — R051 Acute cough: Secondary | ICD-10-CM

## 2023-02-11 DIAGNOSIS — J0140 Acute pansinusitis, unspecified: Secondary | ICD-10-CM | POA: Diagnosis not present

## 2023-02-11 MED ORDER — AMOXICILLIN-POT CLAVULANATE 875-125 MG PO TABS
1.0000 | ORAL_TABLET | Freq: Two times a day (BID) | ORAL | 0 refills | Status: DC
Start: 2023-02-11 — End: 2023-02-11

## 2023-02-11 MED ORDER — AMOXICILLIN-POT CLAVULANATE 875-125 MG PO TABS
1.0000 | ORAL_TABLET | Freq: Two times a day (BID) | ORAL | 0 refills | Status: DC
Start: 2023-02-11 — End: 2023-05-30
  Filled 2023-02-11 (×2): qty 20, 10d supply, fill #0

## 2023-02-11 MED ORDER — PROMETHAZINE-DM 6.25-15 MG/5ML PO SYRP
5.0000 mL | ORAL_SOLUTION | Freq: Four times a day (QID) | ORAL | 0 refills | Status: DC | PRN
Start: 2023-02-11 — End: 2023-02-11

## 2023-02-11 MED ORDER — PROMETHAZINE-DM 6.25-15 MG/5ML PO SYRP
5.0000 mL | ORAL_SOLUTION | Freq: Four times a day (QID) | ORAL | 0 refills | Status: DC | PRN
Start: 2023-02-11 — End: 2023-05-30
  Filled 2023-02-11 (×2): qty 118, 6d supply, fill #0

## 2023-02-11 NOTE — Progress Notes (Signed)
Established Patient Office Visit  Subjective   Patient ID: [name], female    DOB: September 29, 1955  Age: 67 y.o. MRN: [id number]  Chief Complaint  Patient presents with   Sinus Problem    Sxs started last Friday, pt states having productive cough, congestion, no fever. No COVID test.     HPI Discussed the use of AI scribe software for clinical note transcription with the patient, who gave verbal consent to proceed.  History of Present Illness   The patient presents with a recent history of a stye in the left eye, for which she sought treatment from an ophthalmologist. She was prescribed topical and oral Keflex, which she has completed. Subsequently, she developed sinus pressure and pain, which has been worsening over the past week. The patient reports a significant amount of yellow-green and pink-tinged nasal discharge, primarily from the left nostril. She has been using azelastine and Flonase nasal sprays, with occasional use of Afrin for symptom relief. The patient denies any significant fever, with a maximum recorded temperature of 99.3 degrees Fahrenheit.  In the context of these symptoms, the patient has been under significant stress due to her son's recent diagnosis of leukemia and subsequent treatment. She reports difficulty managing her own health needs amidst the demands of her son's illness. The patient also mentions a history of osteoporosis and expresses concern about potential falls.  The patient's medical regimen includes trazodone, which she reports has been helpful for sleep during this stressful period. She has not reported any new medications or changes to her existing regimen. She has not reported any changes in weight, although she expresses concern about potential weight loss due to stress.      Patient Active Problem List   Diagnosis Date Noted   Acute non-recurrent pansinusitis 02/11/2023   Aortic atherosclerosis (HCC) 05/20/2022   Lung nodules 03/31/2022    Abnormal prominence of clavicle 03/05/2022   COVID-19 virus infection 05/29/2021   Dupuytren contracture 05/07/2021   Preventative health care 11/06/2020   Coronary artery disease involving native heart with angina pectoris (HCC) 05/05/2020   Age-related osteoporosis without current pathological fracture 05/05/2020   Hyperlipidemia 05/05/2020   Jaw pain 12/23/2019   Bronchiectasis without complication (HCC)    Atherosclerosis 07/08/2019   Costochondritis 01/24/2019   Chronic cough 01/24/2019   History of cigarette smoking 01/24/2019   RUQ pain 07/29/2016   Epigastric pain 07/29/2016   Osteopenia 08/23/2014   Benign neoplasm of skin 05/07/2010   Abnormal mammogram 04/18/2008   Primary hypertension 03/24/2007   SPONTANEOUS TENSION PNEUMOTHORAX 03/24/2007   FIBROIDS, UTERUS 01/18/2007   MENORRHAGIA, PERIMENOPAUSAL 01/13/2007   Past Medical History:  Diagnosis Date   Allergy    Cataract    Chronic cough 01/24/2019   Costochondritis 01/24/2019   Emphysema of lung (HCC) 2020   Mild emphysema   Epigastric pain 07/29/2016   FIBROIDS, UTERUS 01/18/2007   Qualifier: Diagnosis of  By: Janit Bern     H/O Bell's palsy 05/2010   HYPERTENSION 03/24/2007   Qualifier: Diagnosis of  By: Yetta Barre CMA, Chemira     MENORRHAGIA, PERIMENOPAUSAL 01/13/2007   Qualifier: Diagnosis of  By: Janit Bern     MOLE 05/07/2010   Qualifier: Diagnosis of  By: Janit Bern     Osteopenia 08/23/2014   Osteoporosis 07/2019   RUQ pain 07/29/2016   Spontaneous pneumothorax 2005   R, s/p tube, then VATS   Spontaneous tension pneumothorax 03/24/2007   Qualifier: Diagnosis of  By: Janit Bern     Past Surgical History:  Procedure Laterality Date   BRONCHIAL WASHINGS  08/06/2019   Procedure: BRONCHIAL lavage;  Surgeon: Kalman Shan, MD;  Location: [facility];  Service: Endoscopy;;   CATARACT EXTRACTION W/ INTRAOCULAR LENS IMPLANT Bilateral    june 2023   CESAREAN SECTION     CHEST  TUBE INSERTION  2005   spontaneus Pneumothorax   COLONOSCOPY     EYE SURGERY  07/2020   Cararacts   SPIROMETRY  01/20/2015   Dr. [place] Callas   VIDEO ASSISTED THORACOSCOPY  2005   VIDEO BRONCHOSCOPY N/A 08/06/2019   Procedure: VIDEO BRONCHOSCOPY WITHOUT FLUORO;  Surgeon: Kalman Shan, MD;  Location: [facility];  Service: Endoscopy;  Laterality: N/A;   WISDOM TOOTH EXTRACTION     Social History   Tobacco Use   Smoking status: Former    Current packs/day: 0.00    Average packs/day: 1 pack/day for 14.0 years (14.0 ttl pk-yrs)    Types: Cigarettes    Start date: 07/10/1968    Quit date: 07/11/1982    Years since quitting: 40.6   Smokeless tobacco: Never  Vaping Use   Vaping status: Never Used  Substance Use Topics   Alcohol use: Yes    Alcohol/week: 0.0 standard drinks of alcohol    Comment: rare   Drug use: No   Social History   Socioeconomic History   Marital status: Married    Spouse name: Not on file   Number of children: 3   Years of education: Not on file   Highest education level: 12th grade  Occupational History   Occupation: southern Engineer, site    Employer: [employer]  Tobacco Use   Smoking status: Former    Current packs/day: 0.00    Average packs/day: 1 pack/day for 14.0 years (14.0 ttl pk-yrs)    Types: Cigarettes    Start date: 07/10/1968    Quit date: 07/11/1982    Years since quitting: 40.6   Smokeless tobacco: Never  Vaping Use   Vaping status: Never Used  Substance and Sexual Activity   Alcohol use: Yes    Alcohol/week: 0.0 standard drinks of alcohol    Comment: rare   Drug use: No   Sexual activity: Yes    Partners: Male    Birth control/protection: Post-menopausal    Comment: 1st intercourse 67 yo-Fewer than 5 partners  Other Topics Concern   Not on file  Social History Narrative   Exercise-- jazzersize  3x a week    Social Drivers of Corporate investment banker Strain: Low Risk  (02/10/2023)   Overall  Financial Resource Strain (CARDIA)    Difficulty of Paying Living Expenses: Not very hard  Food Insecurity: No Food Insecurity (02/10/2023)   Hunger Vital Sign    Worried About Running Out of Food in the Last Year: Never true    Ran Out of Food in the Last Year: Never true  Transportation Needs: No Transportation Needs (02/10/2023)   PRAPARE - Administrator, Civil Service (Medical): No    Lack of Transportation (Non-Medical): No  Physical Activity: Sufficiently Active (02/10/2023)   Exercise Vital Sign    Days of Exercise per Week: 5 days    Minutes of Exercise per Session: 60 min  Stress: No Stress Concern Present (02/10/2023)   [employer] of Occupational Health - Occupational Stress Questionnaire    Feeling of Stress : Only a little  Social Connections: Moderately Isolated (  02/10/2023)   Social Connection and Isolation Panel [NHANES]    Frequency of Communication with Friends and Family: Once a week    Frequency of Social Gatherings with Friends and Family: Once a week    Attends Religious Services: Never    Database administrator or Organizations: Yes    Attends Engineer, structural: More than 4 times per year    Marital Status: Married  Catering manager Violence: Not At Risk (01/05/2023)   Humiliation, Afraid, Rape, and Kick questionnaire    Fear of Current or Ex-Partner: No    Emotionally Abused: No    Physically Abused: No    Sexually Abused: No   Family Status  Relation Name Status   Mother Jerrye Beavers Deceased at age 52       CVA   Father Junious Dresser Deceased at age 48       AMI   MGM Idell Pickles Deceased at age 79       metastatic breast cancer   Daughter  Alive   Daughter  Alive   Daughter  Alive   MGF Leisure centre manager (Not Specified)   Neg Hx  (Not Specified)  No partnership data on file   Family History  Problem Relation Age of Onset   Hypertension Mother    Stroke Mother    Heart disease Father    Hypertension Father    Arthritis Father    Cancer  Maternal Grandmother 60       breast   Diabetes Maternal Grandmother    COPD Maternal Grandfather    Colon cancer Neg Hx    Esophageal cancer Neg Hx    Rectal cancer Neg Hx    Stomach cancer Neg Hx    No Known Allergies    Review of Systems  Constitutional:  Negative for fever and malaise/fatigue.  HENT:  Positive for congestion and sinus pain.   Eyes:  Negative for blurred vision.  Respiratory:  Positive for cough and sputum production. Negative for shortness of breath.   Cardiovascular:  Negative for chest pain, palpitations and leg swelling.  Gastrointestinal:  Negative for abdominal pain, blood in stool and nausea.  Genitourinary:  Negative for dysuria and frequency.  Musculoskeletal:  Negative for falls.  Skin:  Negative for rash.  Neurological:  Negative for dizziness, loss of consciousness and headaches.  Endo/Heme/Allergies:  Negative for environmental allergies.  Psychiatric/Behavioral:  Negative for depression. The patient is not nervous/anxious.       Objective:     BP 128/80 (BP Location: Left Arm, Patient Position: Sitting, Cuff Size: Normal)   Pulse 74   Temp 98.4 F (36.9 C) (Oral)   Resp 18   Ht 5\' 3"  (1.6 m)   Wt 116 lb 12.8 oz (53 kg)   SpO2 99%   BMI 20.69 kg/m  BP Readings from Last 3 Encounters:  02/11/23 128/80  11/29/22 110/70  09/20/22 130/80   Wt Readings from Last 3 Encounters:  02/11/23 116 lb 12.8 oz (53 kg)  11/29/22 124 lb 6.4 oz (56.4 kg)  09/20/22 128 lb (58.1 kg)   SpO2 Readings from Last 3 Encounters:  02/11/23 99%  11/29/22 98%  09/20/22 98%      Physical Exam Vitals and nursing note reviewed.  Constitutional:      General: She is not in acute distress.    Appearance: Normal appearance. She is well-developed.  HENT:     Head: Normocephalic and atraumatic.     Right Ear: Tympanic membrane, ear canal  and external ear normal. There is no impacted cerumen.     Left Ear: Tympanic membrane, ear canal and external ear  normal. There is no impacted cerumen.     Nose:     Right Sinus: No maxillary sinus tenderness or frontal sinus tenderness.     Left Sinus: Maxillary sinus tenderness and frontal sinus tenderness present.     Mouth/Throat:     Mouth: Mucous membranes are moist.     Pharynx: Oropharynx is clear. No oropharyngeal exudate or posterior oropharyngeal erythema.  Eyes:     General: No scleral icterus.       Right eye: No discharge.        Left eye: No discharge.     Conjunctiva/sclera: Conjunctivae normal.     Pupils: Pupils are equal, round, and reactive to light.  Neck:     Thyroid: No thyromegaly or thyroid tenderness.     Vascular: No JVD.  Cardiovascular:     Rate and Rhythm: Normal rate and regular rhythm.     Heart sounds: Normal heart sounds. No murmur heard. Pulmonary:     Effort: Pulmonary effort is normal. No respiratory distress.     Breath sounds: Normal breath sounds.  Abdominal:     General: Bowel sounds are normal. There is no distension.     Palpations: Abdomen is soft. There is no mass.     Tenderness: There is no abdominal tenderness. There is no guarding or rebound.  Genitourinary:    Vagina: Normal.  Musculoskeletal:        General: Normal range of motion.     Cervical back: Normal range of motion and neck supple.     Right lower leg: No edema.     Left lower leg: No edema.  Lymphadenopathy:     Cervical: No cervical adenopathy.  Skin:    General: Skin is warm and dry.     Findings: No erythema or rash.  Neurological:     Mental Status: She is alert and oriented to person, place, and time.     Cranial Nerves: No cranial nerve deficit.     Deep Tendon Reflexes: Reflexes are normal and symmetric.  Psychiatric:        Mood and Affect: Mood normal.        Behavior: Behavior normal.        Thought Content: Thought content normal.        Judgment: Judgment normal.      No results found for any visits on 02/11/23.  Last CBC Lab Results  Component Value  Date   WBC 5.3 11/29/2022   HGB 13.3 11/29/2022   HCT 38.7 11/29/2022   MCV 88.1 11/29/2022   MCH 31.0 01/24/2021   RDW 12.5 11/29/2022   PLT 208.0 11/29/2022   Last metabolic panel Lab Results  Component Value Date   GLUCOSE 103 (H) 11/29/2022   NA 137 11/29/2022   K 3.8 11/29/2022   CL 100 11/29/2022   CO2 29 11/29/2022   BUN 12 11/29/2022   CREATININE 0.62 11/29/2022   GFR 90.71 11/29/2022   CALCIUM 9.1 11/29/2022   PROT 6.9 11/29/2022   ALBUMIN 4.5 11/29/2022   BILITOT 0.7 11/29/2022   ALKPHOS 63 11/29/2022   AST 16 11/29/2022   ALT 17 11/29/2022   ANIONGAP 8 01/24/2021   Last lipids Lab Results  Component Value Date   CHOL 133 11/29/2022   HDL 71.20 11/29/2022   LDLCALC 52 11/29/2022   LDLDIRECT 111.7 05/07/2010  TRIG 51.0 11/29/2022   CHOLHDL 2 11/29/2022   Last hemoglobin A1c No results found for: "HGBA1C" Last thyroid functions Lab Results  Component Value Date   TSH 1.17 11/29/2022   Last vitamin D Lab Results  Component Value Date   VD25OH 23.96 (L) 11/29/2022   Last vitamin B12 and Folate No results found for: "VITAMINB12", "FOLATE"    The 10-year ASCVD risk score (Arnett DK, et al., 2019) is: 9.3%    Assessment & Plan:   Problem List Items Addressed This Visit       Unprioritized   Acute non-recurrent pansinusitis - Primary   Relevant Medications   amoxicillin-clavulanate (AUGMENTIN) 875-125 MG tablet   promethazine-dextromethorphan (PROMETHAZINE-DM) 6.25-15 MG/5ML syrup   Other Visit Diagnoses       Acute cough       Relevant Medications   promethazine-dextromethorphan (PROMETHAZINE-DM) 6.25-15 MG/5ML syrup     Assessment and Plan    Stye   The stye in her left eye, initially treated with eye drops and oral Keflex, showed initial improvement but symptoms have worsened, presenting increased yellow-green, pink-tinged discharge, and pressure without significant fever. She is concerned about her treatment due to hypertension and  heart disease. We will continue the prescribed eye drops and monitor symptoms, advising her to report if there is no improvement.  Upper Respiratory Infection   She presents with cough, nasal congestion, and occasional low-grade fever, currently using azelastine, Flonase, and Afrin sparingly. There is no significant sore throat or severe cough. Due to hypertension and heart disease, we will prescribe an antibiotic and cough syrup, continue azelastine and Flonase, avoid decongestants containing pseudoephedrine, use Mucinex DM as needed for cough suppression, and monitor symptoms, advising her to report if there is no improvement.  Hypertension   Her hypertension requires caution with over-the-counter medications due to potential interactions, specifically avoiding decongestants containing pseudoephedrine. We advised using Mucinex DM as needed for cough suppression.  Heart Disease   Her heart disease necessitates caution with over-the-counter medications due to potential interactions, with an emphasis on avoiding decongestants containing pseudoephedrine. We recommended using Mucinex DM as needed for cough suppression.  Osteoporosis   She has osteoporosis and is concerned about fall risk. We discussed the importance of avoiding activities that increase fall risk and advised continuing current osteoporosis management.  General Health Maintenance   She is under significant stress due to her son's medical condition and is concerned about maintaining her own health. We discussed the importance of self-care and stress management, noting weight loss and the need for adequate nutrition. We encouraged self-care and stress management and emphasized ensuring adequate nutrition to prevent further weight loss.  Follow-up   We scheduled a follow-up with Dr. [place] Callas in March and advised monitoring symptoms and reporting if there is no improvement.        No follow-ups on file.    Donato Schultz, DO

## 2023-02-15 ENCOUNTER — Other Ambulatory Visit: Payer: Self-pay | Admitting: Primary Care

## 2023-02-18 NOTE — Telephone Encounter (Signed)
Checking on PA/cost.

## 2023-02-23 ENCOUNTER — Encounter: Payer: Self-pay | Admitting: Family Medicine

## 2023-02-24 MED ORDER — VITAMIN D (ERGOCALCIFEROL) 1.25 MG (50000 UNIT) PO CAPS: 50000.0000 [IU] | ORAL_CAPSULE | ORAL | 1 refills | Status: DC

## 2023-03-02 NOTE — Telephone Encounter (Signed)
 Checking on PA/Cost.

## 2023-03-10 ENCOUNTER — Other Ambulatory Visit (HOSPITAL_COMMUNITY): Payer: Self-pay

## 2023-03-10 ENCOUNTER — Telehealth: Payer: Self-pay

## 2023-03-10 NOTE — Telephone Encounter (Signed)
 Prolia VOB initiated via [email or web address]  Next Prolia inj DUE: NOW

## 2023-03-11 ENCOUNTER — Other Ambulatory Visit (HOSPITAL_COMMUNITY): Payer: Self-pay

## 2023-03-11 NOTE — Telephone Encounter (Signed)
 Marland Kitchen

## 2023-03-11 NOTE — Telephone Encounter (Signed)
Pharmacy Patient Advocate Encounter   Received notification from  [facility] Portal that prior authorization for PROLIA is required/requested.   Insurance verification completed.   The patient is insured through [place] .   Per test claim: PA required; PA submitted to above mentioned insurance via CoverMyMeds Key/confirmation #/EOC [id number] Status is pending

## 2023-03-15 ENCOUNTER — Other Ambulatory Visit (HOSPITAL_COMMUNITY): Payer: Self-pay

## 2023-03-15 NOTE — Telephone Encounter (Signed)
Pharmacy Patient Advocate Encounter  Received notification from [facility] that Prior Authorization for PROLIA has been APPROVED from 04/30/20 to 02/22/24. Ran test claim, Copay is $64. This test claim was processed through [facility]- copay amounts may vary at other pharmacies due to pharmacy/plan contracts, or as the patient moves through the different stages of their insurance plan.   PA #/Case ID/Reference #: [id number]

## 2023-03-15 NOTE — Telephone Encounter (Signed)
Pt ready for scheduling for PROLIA on or after : 03/15/23  Out-of-pocket cost due at time of visit: $40  Number of injection/visits approved: 2  Primary: [employer] Prolia co-insurance: $40 Admin fee co-insurance: 0%  Secondary: --- Prolia co-insurance:  Admin fee co-insurance:   Medical Benefit Details: Date Benefits were checked: 03/10/23 Deductible: NO/ Coinsurance: $40/ Admin Fee: 0%  Prior Auth: APPROVED PA# [id number]  Expiration Date: 04/30/20-02/22/24   # of doses approved: 2  Pharmacy benefit: Copay $64 If patient wants fill through the pharmacy benefit please send prescription to: [employer], and include estimated need by date in rx notes. Pharmacy will ship medication directly to the office.  Patient NOT eligible for Prolia Copay Card. Copay Card can make patient's cost as little as $25. Link to apply: [email or web address]://[email or web address]  ** This summary of benefits is an estimation of the patient's out-of-pocket cost. Exact cost may very based on individual plan coverage.

## 2023-03-17 NOTE — Telephone Encounter (Signed)
Pt has been scheduled.  °

## 2023-03-21 NOTE — Progress Notes (Unsigned)
[name] is a 68 y.o. female presents to the office today for Prolia, per physician's orders. Prolia 60 mg SQ , was administered : arm today. Patient tolerated injection. Patient next injection due: 6 months, appt made: No- will schedule in 5 months after benifits are ran again Initial injection: NO Patient supplied: NO   Creft, Melton Alar L

## 2023-03-22 ENCOUNTER — Ambulatory Visit (INDEPENDENT_AMBULATORY_CARE_PROVIDER_SITE_OTHER): Payer: Medicare PPO

## 2023-03-22 ENCOUNTER — Telehealth: Payer: Self-pay

## 2023-03-22 DIAGNOSIS — M810 Age-related osteoporosis without current pathological fracture: Secondary | ICD-10-CM | POA: Diagnosis not present

## 2023-03-22 MED ORDER — DENOSUMAB 60 MG/ML ~~LOC~~ SOSY
60.0000 mg | PREFILLED_SYRINGE | Freq: Once | SUBCUTANEOUS | Status: AC
  Administered 2023-09-22: 60 mg via SUBCUTANEOUS

## 2023-03-22 NOTE — Telephone Encounter (Signed)
Prolia was done 03/22/2023 Next is due 09/19/2023 CAM placed.

## 2023-05-17 ENCOUNTER — Other Ambulatory Visit: Payer: Self-pay | Admitting: Family Medicine

## 2023-05-27 DIAGNOSIS — R052 Subacute cough: Secondary | ICD-10-CM | POA: Diagnosis not present

## 2023-05-27 DIAGNOSIS — J300 Vasomotor rhinitis: Secondary | ICD-10-CM | POA: Diagnosis not present

## 2023-05-30 ENCOUNTER — Encounter: Payer: Self-pay | Admitting: Family Medicine

## 2023-05-30 ENCOUNTER — Ambulatory Visit: Payer: Medicare PPO | Admitting: Family Medicine

## 2023-05-30 VITALS — BP 124/78 | HR 63 | Temp 98.2°F | Resp 18 | Ht 63.0 in | Wt 120.6 lb

## 2023-05-30 DIAGNOSIS — M810 Age-related osteoporosis without current pathological fracture: Secondary | ICD-10-CM | POA: Diagnosis not present

## 2023-05-30 DIAGNOSIS — I700 Atherosclerosis of aorta: Secondary | ICD-10-CM | POA: Diagnosis not present

## 2023-05-30 DIAGNOSIS — I10 Essential (primary) hypertension: Secondary | ICD-10-CM

## 2023-05-30 DIAGNOSIS — E559 Vitamin D deficiency, unspecified: Secondary | ICD-10-CM

## 2023-05-30 DIAGNOSIS — E785 Hyperlipidemia, unspecified: Secondary | ICD-10-CM

## 2023-05-30 LAB — CBC WITH DIFFERENTIAL/PLATELET
Basophils Absolute: 0 10*3/uL (ref 0.0–0.1)
Basophils Relative: 1.1 % (ref 0.0–3.0)
Eosinophils Absolute: 0.1 10*3/uL (ref 0.0–0.7)
Eosinophils Relative: 2.5 % (ref 0.0–5.0)
HCT: 38.5 % (ref 36.0–46.0)
Hemoglobin: 13.4 g/dL (ref 12.0–15.0)
Lymphocytes Relative: 28.8 % (ref 12.0–46.0)
Lymphs Abs: 1.2 10*3/uL (ref 0.7–4.0)
MCHC: 34.9 g/dL (ref 30.0–36.0)
MCV: 88.1 fl (ref 78.0–100.0)
Monocytes Absolute: 0.3 10*3/uL (ref 0.1–1.0)
Monocytes Relative: 6.5 % (ref 3.0–12.0)
Neutro Abs: 2.6 10*3/uL (ref 1.4–7.7)
Neutrophils Relative %: 61.1 % (ref 43.0–77.0)
Platelets: 204 10*3/uL (ref 150.0–400.0)
RBC: 4.37 Mil/uL (ref 3.87–5.11)
RDW: 12.5 % (ref 11.5–15.5)
WBC: 4.3 10*3/uL (ref 4.0–10.5)

## 2023-05-30 LAB — COMPREHENSIVE METABOLIC PANEL WITH GFR
ALT: 17 U/L (ref 0–35)
AST: 15 U/L (ref 0–37)
Albumin: 4.6 g/dL (ref 3.5–5.2)
Alkaline Phosphatase: 63 U/L (ref 39–117)
BUN: 11 mg/dL (ref 6–23)
CO2: 29 meq/L (ref 19–32)
Calcium: 9.2 mg/dL (ref 8.4–10.5)
Chloride: 101 meq/L (ref 96–112)
Creatinine, Ser: 0.59 mg/dL (ref 0.40–1.20)
GFR: 91.48 mL/min (ref >60.00)
Glucose, Bld: 100 mg/dL — ABNORMAL HIGH (ref 70–99)
Potassium: 3.8 meq/L (ref 3.5–5.1)
Sodium: 137 meq/L (ref 135–145)
Total Bilirubin: 0.7 mg/dL (ref 0.2–1.2)
Total Protein: 7 g/dL (ref 6.0–8.3)

## 2023-05-30 LAB — TSH: TSH: 0.59 u[IU]/mL (ref 0.35–5.50)

## 2023-05-30 LAB — LIPID PANEL
Cholesterol: 134 mg/dL (ref 0–200)
HDL: 65.5 mg/dL (ref >39.00)
LDL Cholesterol: 58 mg/dL (ref 0–99)
NonHDL: 68.83
Total CHOL/HDL Ratio: 2
Triglycerides: 54 mg/dL (ref 0.0–149.0)
VLDL: 10.8 mg/dL (ref 0.0–40.0)

## 2023-05-30 LAB — VITAMIN D 25 HYDROXY (VIT D DEFICIENCY, FRACTURES): VITD: 41.72 ng/mL (ref 30.00–100.00)

## 2023-05-30 NOTE — Assessment & Plan Note (Signed)
 Encourage heart healthy diet such as MIND or DASH diet, increase exercise, avoid trans fats, simple carbohydrates and processed foods, consider a krill or fish or flaxseed oil cap daily.

## 2023-05-30 NOTE — Assessment & Plan Note (Signed)
 Well controlled, no changes to meds. Encouraged heart healthy diet such as the DASH diet and exercise as tolerated.

## 2023-05-30 NOTE — Progress Notes (Signed)
 Established Patient Office Visit  Subjective   Patient ID: [name], female    DOB: Jan 29, 1956  Age: 68 y.o. MRN: [id number]  Chief Complaint  Patient presents with   Follow-up    HPI Discussed the use of AI scribe software for clinical note transcription with the patient, who gave verbal consent to proceed.  History of Present Illness [name] is a 68 year old female who presents for a follow-up on her vitamin D levels. She is accompanied by her husband, who is currently hospitalized.  Her husband, aged 32, is hospitalized due to complications from leukemia and Crohn's disease. He has experienced significant weight loss, weakness, and difficulty eating following chemotherapy. He has a chemotherapy port, replacing a previous PICC line. His daughter from [place] assists with his care. He has been prescribed marijuana pills to aid appetite, as he struggles to eat and has lost weight, dropping from 185 to 164 pounds. Despite no pain, he experiences nausea and heaves, complicating nutritional intake. He tries various foods, including eggs and bologna, but finds few appealing options. He developed an itchy rash with purple-red spots that peel and bleed. Recently, he was unable to reach the bathroom in time, leading to a call to emergency services. He was found with a fever and suspected sepsis, though C. diff, flu, COVID, and RSV were ruled out.  She has completed a course of 50,000 units of vitamin D and is currently taking 2,000 units. She is concerned about whether this regimen has improved her vitamin D status. She uses trazodone for sleep.   Patient Active Problem List   Diagnosis Date Noted   Acute non-recurrent pansinusitis 02/11/2023   Aortic atherosclerosis (HCC) 05/20/2022   Lung nodules 03/31/2022   Abnormal prominence of clavicle 03/05/2022   COVID-19 virus infection 05/29/2021   Dupuytren contracture 05/07/2021   Preventative health care 11/06/2020   Coronary artery  disease involving native heart with angina pectoris (HCC) 05/05/2020   Age-related osteoporosis without current pathological fracture 05/05/2020   Hyperlipidemia 05/05/2020   Jaw pain 12/23/2019   Bronchiectasis without complication (HCC)    Atherosclerosis 07/08/2019   Costochondritis 01/24/2019   Chronic cough 01/24/2019   History of cigarette smoking 01/24/2019   RUQ pain 07/29/2016   Epigastric pain 07/29/2016   Osteopenia 08/23/2014   Benign neoplasm of skin 05/07/2010   Abnormal mammogram 04/18/2008   Primary hypertension 03/24/2007   SPONTANEOUS TENSION PNEUMOTHORAX 03/24/2007   FIBROIDS, UTERUS 01/18/2007   MENORRHAGIA, PERIMENOPAUSAL 01/13/2007   Past Medical History:  Diagnosis Date   Allergy    Cataract    Chronic cough 01/24/2019   Costochondritis 01/24/2019   Emphysema of lung (HCC) 2020   Mild emphysema   Epigastric pain 07/29/2016   FIBROIDS, UTERUS 01/18/2007   Qualifier: Diagnosis of  By: Janit Bern     H/O Bell's palsy 05/2010   HYPERTENSION 03/24/2007   Qualifier: Diagnosis of  By: Yetta Barre CMA, Chemira     MENORRHAGIA, PERIMENOPAUSAL 01/13/2007   Qualifier: Diagnosis of  By: Janit Bern     MOLE 05/07/2010   Qualifier: Diagnosis of  By: Janit Bern     Osteopenia 08/23/2014   Osteoporosis 07/2019   RUQ pain 07/29/2016   Spontaneous pneumothorax 2005   R, s/p tube, then VATS   Spontaneous tension pneumothorax 03/24/2007   Qualifier: Diagnosis of  By: Janit Bern     Past Surgical History:  Procedure Laterality Date   BRONCHIAL WASHINGS  08/06/2019  Procedure: BRONCHIAL lavage;  Surgeon: Kalman Shan, MD;  Location: [facility];  Service: Endoscopy;;   CATARACT EXTRACTION W/ INTRAOCULAR LENS IMPLANT Bilateral    june 2023   CESAREAN SECTION     CHEST TUBE INSERTION  2005   spontaneus Pneumothorax   COLONOSCOPY     EYE SURGERY  07/2020   Cararacts   SPIROMETRY  01/20/2015   Dr. Morris Callas   VIDEO ASSISTED THORACOSCOPY   2005   VIDEO BRONCHOSCOPY N/A 08/06/2019   Procedure: VIDEO BRONCHOSCOPY WITHOUT FLUORO;  Surgeon: Kalman Shan, MD;  Location: [facility];  Service: Endoscopy;  Laterality: N/A;   WISDOM TOOTH EXTRACTION     Social History   Tobacco Use   Smoking status: Former    Current packs/day: 0.00    Average packs/day: 1 pack/day for 14.0 years (14.0 ttl pk-yrs)    Types: Cigarettes    Start date: 07/10/1968    Quit date: 07/11/1982    Years since quitting: 40.9   Smokeless tobacco: Never  Vaping Use   Vaping status: Never Used  Substance Use Topics   Alcohol use: Yes    Alcohol/week: 0.0 standard drinks of alcohol    Comment: rare   Drug use: No   Social History   Socioeconomic History   Marital status: Married    Spouse name: Not on file   Number of children: 3   Years of education: Not on file   Highest education level: 12th grade  Occupational History   Occupation: southern Engineer, site    Employer: [employer]  Tobacco Use   Smoking status: Former    Current packs/day: 0.00    Average packs/day: 1 pack/day for 14.0 years (14.0 ttl pk-yrs)    Types: Cigarettes    Start date: 07/10/1968    Quit date: 07/11/1982    Years since quitting: 40.9   Smokeless tobacco: Never  Vaping Use   Vaping status: Never Used  Substance and Sexual Activity   Alcohol use: Yes    Alcohol/week: 0.0 standard drinks of alcohol    Comment: rare   Drug use: No   Sexual activity: Yes    Partners: Male    Birth control/protection: Post-menopausal    Comment: 1st intercourse 68 yo-Fewer than 5 partners  Other Topics Concern   Not on file  Social History Narrative   Exercise-- jazzersize  3x a week    Social Drivers of Corporate investment banker Strain: Low Risk  (02/10/2023)   Overall Financial Resource Strain (CARDIA)    Difficulty of Paying Living Expenses: Not very hard  Food Insecurity: No Food Insecurity (02/10/2023)   Hunger Vital Sign    Worried About  Running Out of Food in the Last Year: Never true    Ran Out of Food in the Last Year: Never true  Transportation Needs: No Transportation Needs (02/10/2023)   PRAPARE - Administrator, Civil Service (Medical): No    Lack of Transportation (Non-Medical): No  Physical Activity: Sufficiently Active (02/10/2023)   Exercise Vital Sign    Days of Exercise per Week: 5 days    Minutes of Exercise per Session: 60 min  Stress: No Stress Concern Present (02/10/2023)   [employer] of Occupational Health - Occupational Stress Questionnaire    Feeling of Stress : Only a little  Social Connections: Moderately Isolated (02/10/2023)   Social Connection and Isolation Panel [NHANES]    Frequency of Communication with Friends and Family: Once a week  Frequency of Social Gatherings with Friends and Family: Once a week    Attends Religious Services: Never    Database administrator or Organizations: Yes    Attends Engineer, structural: More than 4 times per year    Marital Status: Married  Catering manager Violence: Not At Risk (01/05/2023)   Humiliation, Afraid, Rape, and Kick questionnaire    Fear of Current or Ex-Partner: No    Emotionally Abused: No    Physically Abused: No    Sexually Abused: No   Family Status  Relation Name Status   Mother Jerrye Beavers Deceased at age 28       CVA   Father Junious Dresser Deceased at age 70       AMI   MGM Idell Pickles Deceased at age 41       metastatic breast cancer   Daughter  Alive   Daughter  Alive   Daughter  Alive   MGF Leisure centre manager (Not Specified)   Neg Hx  (Not Specified)  No partnership data on file   Family History  Problem Relation Age of Onset   Hypertension Mother    Stroke Mother    Heart disease Father    Hypertension Father    Arthritis Father    Cancer Maternal Grandmother 4       breast   Diabetes Maternal Grandmother    COPD Maternal Grandfather    Colon cancer Neg Hx    Esophageal cancer Neg Hx    Rectal cancer Neg Hx     Stomach cancer Neg Hx    No Known Allergies    Review of Systems  Constitutional:  Negative for chills, fever and malaise/fatigue.  HENT:  Negative for congestion and hearing loss.   Eyes:  Negative for blurred vision and discharge.  Respiratory:  Negative for cough, sputum production and shortness of breath.   Cardiovascular:  Negative for chest pain, palpitations and leg swelling.  Gastrointestinal:  Negative for abdominal pain, blood in stool, constipation, diarrhea, heartburn, nausea and vomiting.  Genitourinary:  Negative for dysuria, frequency, hematuria and urgency.  Musculoskeletal:  Negative for back pain, falls and myalgias.  Skin:  Negative for rash.  Neurological:  Negative for dizziness, sensory change, loss of consciousness, weakness and headaches.  Endo/Heme/Allergies:  Negative for environmental allergies. Does not bruise/bleed easily.  Psychiatric/Behavioral:  Negative for depression and suicidal ideas. The patient is not nervous/anxious and does not have insomnia.       Objective:     BP 124/78 (BP Location: Right Arm, Patient Position: Sitting)   Pulse 63   Temp 98.2 F (36.8 C) (Oral)   Resp 18   Ht 5\' 3"  (1.6 m)   Wt 120 lb 9.6 oz (54.7 kg)   SpO2 97%   BMI 21.36 kg/m  BP Readings from Last 3 Encounters:  05/30/23 124/78  02/11/23 128/80  11/29/22 110/70   Wt Readings from Last 3 Encounters:  05/30/23 120 lb 9.6 oz (54.7 kg)  02/11/23 116 lb 12.8 oz (53 kg)  11/29/22 124 lb 6.4 oz (56.4 kg)   SpO2 Readings from Last 3 Encounters:  05/30/23 97%  02/11/23 99%  11/29/22 98%      Physical Exam Vitals and nursing note reviewed.  Constitutional:      General: She is not in acute distress.    Appearance: Normal appearance. She is well-developed.  HENT:     Head: Normocephalic and atraumatic.  Eyes:     General: No scleral icterus.  Right eye: No discharge.        Left eye: No discharge.  Cardiovascular:     Rate and Rhythm: Normal rate  and regular rhythm.     Heart sounds: No murmur heard. Pulmonary:     Effort: Pulmonary effort is normal. No respiratory distress.     Breath sounds: Normal breath sounds.  Musculoskeletal:        General: Normal range of motion.     Cervical back: Normal range of motion and neck supple.     Right lower leg: No edema.     Left lower leg: No edema.  Skin:    General: Skin is warm and dry.  Neurological:     Mental Status: She is alert and oriented to person, place, and time.  Psychiatric:        Mood and Affect: Mood normal.        Behavior: Behavior normal.        Thought Content: Thought content normal.        Judgment: Judgment normal.      No results found for any visits on 05/30/23.  Last CBC Lab Results  Component Value Date   WBC 5.3 11/29/2022   HGB 13.3 11/29/2022   HCT 38.7 11/29/2022   MCV 88.1 11/29/2022   MCH 31.0 01/24/2021   RDW 12.5 11/29/2022   PLT 208.0 11/29/2022   Last metabolic panel Lab Results  Component Value Date   GLUCOSE 103 (H) 11/29/2022   NA 137 11/29/2022   K 3.8 11/29/2022   CL 100 11/29/2022   CO2 29 11/29/2022   BUN 12 11/29/2022   CREATININE 0.62 11/29/2022   GFR 90.71 11/29/2022   CALCIUM 9.1 11/29/2022   PROT 6.9 11/29/2022   ALBUMIN 4.5 11/29/2022   BILITOT 0.7 11/29/2022   ALKPHOS 63 11/29/2022   AST 16 11/29/2022   ALT 17 11/29/2022   ANIONGAP 8 01/24/2021   Last lipids Lab Results  Component Value Date   CHOL 133 11/29/2022   HDL 71.20 11/29/2022   LDLCALC 52 11/29/2022   LDLDIRECT 111.7 05/07/2010   TRIG 51.0 11/29/2022   CHOLHDL 2 11/29/2022   Last hemoglobin A1c No results found for: "HGBA1C" Last thyroid functions Lab Results  Component Value Date   TSH 1.17 11/29/2022   Last vitamin D Lab Results  Component Value Date   VD25OH 23.96 (L) 11/29/2022   Last vitamin B12 and Folate No results found for: "VITAMINB12", "FOLATE"    The 10-year ASCVD risk score (Arnett DK, et al., 2019) is: 10%     Assessment & Plan:   Problem List Items Addressed This Visit       Unprioritized   Age-related osteoporosis without current pathological fracture   Relevant Orders   DG BONE DENSITY (DXA)   Aortic atherosclerosis (HCC)   Relevant Orders   Comprehensive metabolic panel with GFR   Lipid panel   Primary hypertension - Primary   Well controlled, no changes to meds. Encouraged heart healthy diet such as the DASH diet and exercise as tolerated.        Relevant Orders   CBC with Differential/Platelet   TSH   Hyperlipidemia   Encourage heart healthy diet such as MIND or DASH diet, increase exercise, avoid trans fats, simple carbohydrates and processed foods, consider a krill or fish or flaxseed oil cap daily.        Relevant Orders   Comprehensive metabolic panel with GFR   Lipid panel  Other Visit Diagnoses       Vitamin D deficiency       Relevant Orders   VITAMIN D 25 Hydroxy (Vit-D Deficiency, Fractures)     Assessment and Plan Assessment & Plan Vitamin D deficiency   She completed a course of 50,000 IU vitamin D supplementation and is now taking 2,000 IU daily. She seeks to evaluate the treatment's effectiveness and determine the need for further intervention. Order a vitamin D level test to assess her current status.  General Health Maintenance   She is due for a mammogram in June and may also require a bone density check. She proactively manages her health and understands the importance of regular screenings. Schedule the mammogram and consider a bone density check in June.  Goals of Care   She manages her husband's complex medical conditions, including leukemia and Crohn's disease, and supports his treatment decisions, including discontinuing treatment if it affects his quality of life. She maintains her health to remain strong during this period.  Follow-up   She coordinates her health appointments around her husband's medical needs. Coordinate with healthcare  providers for upcoming appointments and screenings.    Return in about 6 months (around 11/29/2023), or if symptoms worsen or fail to improve.    Donato Schultz, DO

## 2023-06-05 ENCOUNTER — Encounter: Payer: Self-pay | Admitting: Family Medicine

## 2023-07-13 ENCOUNTER — Encounter: Payer: Self-pay | Admitting: Family Medicine

## 2023-07-13 DIAGNOSIS — M810 Age-related osteoporosis without current pathological fracture: Secondary | ICD-10-CM

## 2023-07-27 ENCOUNTER — Other Ambulatory Visit: Payer: Self-pay | Admitting: Family Medicine

## 2023-07-27 DIAGNOSIS — G4709 Other insomnia: Secondary | ICD-10-CM

## 2023-08-08 DIAGNOSIS — Z1231 Encounter for screening mammogram for malignant neoplasm of breast: Secondary | ICD-10-CM | POA: Diagnosis not present

## 2023-08-08 LAB — HM MAMMOGRAPHY

## 2023-08-09 ENCOUNTER — Encounter: Payer: Self-pay | Admitting: Family Medicine

## 2023-08-10 DIAGNOSIS — M8588 Other specified disorders of bone density and structure, other site: Secondary | ICD-10-CM | POA: Diagnosis not present

## 2023-08-10 LAB — HM DEXA SCAN

## 2023-08-15 ENCOUNTER — Encounter: Payer: Self-pay | Admitting: Family Medicine

## 2023-08-19 ENCOUNTER — Other Ambulatory Visit (HOSPITAL_COMMUNITY): Payer: Self-pay

## 2023-08-19 ENCOUNTER — Telehealth: Payer: Self-pay

## 2023-08-19 NOTE — Telephone Encounter (Signed)
 Prolia  VOB initiated via [email or web address]  Next Prolia  inj DUE: 09/19/23

## 2023-08-19 NOTE — Telephone Encounter (Signed)
 [name]

## 2023-08-19 NOTE — Telephone Encounter (Signed)
 Pt ready for scheduling for PROLIA  on or after : 09/19/23  Option# 1: Buy/Bill (Office supplied medication)  Out-of-pocket cost due at time of clinic visit: $332  Number of injection/visits approved: 2  Primary: [employer] Prolia  co-insurance: 20% Admin fee co-insurance: 0%  Secondary: --- Prolia  co-insurance:  Admin fee co-insurance:   Medical Benefit Details: Date Benefits were checked: 08/19/23 Deductible: NO/ Coinsurance: 20%/ Admin Fee: 0%  Prior Auth: APPROVED PA# [id number]  Expiration Date: 04/30/20-02/22/24  # of doses approved: 2 ----------------------------------------------------------------------- Option# 2- Med Obtained from pharmacy:  Pharmacy benefit: Copay $64 (Paid to pharmacy) Admin Fee: 0% (Pay at clinic)  Prior Auth: N/A PA# Expiration Date:   # of doses approved:   If patient wants fill through the pharmacy benefit please send prescription to: [employer], and include estimated need by date in rx notes. Pharmacy will ship medication directly to the office.  Patient NOT eligible for Prolia  Copay Card. Copay Card can make patient's cost as little as $25. Link to apply: [email or web address]://[email or web address]  ** This summary of benefits is an estimation of the patient's out-of-pocket cost. Exact cost may very based on individual plan coverage.

## 2023-08-21 ENCOUNTER — Ambulatory Visit: Payer: Self-pay | Admitting: Family Medicine

## 2023-08-22 ENCOUNTER — Other Ambulatory Visit: Payer: Self-pay | Admitting: Pharmacy Technician

## 2023-08-22 ENCOUNTER — Other Ambulatory Visit: Payer: Self-pay | Admitting: *Deleted

## 2023-08-22 ENCOUNTER — Other Ambulatory Visit: Payer: Self-pay

## 2023-08-22 ENCOUNTER — Other Ambulatory Visit (HOSPITAL_COMMUNITY): Payer: Self-pay

## 2023-08-22 DIAGNOSIS — M810 Age-related osteoporosis without current pathological fracture: Secondary | ICD-10-CM

## 2023-08-22 MED ORDER — PROLIA 60 MG/ML ~~LOC~~ SOSY
60.0000 mg | PREFILLED_SYRINGE | SUBCUTANEOUS | 0 refills | Status: DC
  Filled 2023-08-22 – 2023-08-29 (×2): qty 1, 180d supply, fill #0

## 2023-08-22 NOTE — Progress Notes (Signed)
 Pharmacy Patient Advocate Encounter  Insurance verification completed.   The patient is insured through [employer]   Ran test claim for Prolia. Co-pay is $64.  This test claim was processed through [facility]- copay amounts may vary at other pharmacies due to pharmacy/plan contracts, or as the patient moves through the different stages of their insurance plan.

## 2023-08-29 ENCOUNTER — Other Ambulatory Visit (HOSPITAL_COMMUNITY): Payer: Self-pay

## 2023-08-29 ENCOUNTER — Encounter (HOSPITAL_COMMUNITY): Payer: Self-pay

## 2023-08-29 ENCOUNTER — Other Ambulatory Visit: Payer: Self-pay

## 2023-08-29 NOTE — Progress Notes (Signed)
 Specialty Pharmacy Initial Fill Coordination Note  [name] is a 68 y.o. female contacted today regarding initial fill of specialty medication(s) Denosumab  (Prolia )   Patient requested Courier to Provider Office   Delivery date: 09/13/23   Verified address: [facility] [address]   Medication will be filled on 7/21.   Patient is aware of $64 copayment.

## 2023-09-05 DIAGNOSIS — H35372 Puckering of macula, left eye: Secondary | ICD-10-CM | POA: Diagnosis not present

## 2023-09-05 DIAGNOSIS — Z961 Presence of intraocular lens: Secondary | ICD-10-CM | POA: Diagnosis not present

## 2023-09-15 ENCOUNTER — Ambulatory Visit: Attending: Cardiology | Admitting: Cardiology

## 2023-09-15 ENCOUNTER — Encounter: Payer: Self-pay | Admitting: Cardiology

## 2023-09-15 ENCOUNTER — Ambulatory Visit: Admitting: Cardiology

## 2023-09-15 VITALS — BP 138/80 | HR 65 | Ht 62.5 in | Wt 124.6 lb

## 2023-09-15 DIAGNOSIS — I700 Atherosclerosis of aorta: Secondary | ICD-10-CM

## 2023-09-15 DIAGNOSIS — I452 Bifascicular block: Secondary | ICD-10-CM | POA: Diagnosis not present

## 2023-09-15 DIAGNOSIS — R0600 Dyspnea, unspecified: Secondary | ICD-10-CM

## 2023-09-15 DIAGNOSIS — Z01812 Encounter for preprocedural laboratory examination: Secondary | ICD-10-CM | POA: Diagnosis not present

## 2023-09-15 DIAGNOSIS — I10 Essential (primary) hypertension: Secondary | ICD-10-CM

## 2023-09-15 DIAGNOSIS — R931 Abnormal findings on diagnostic imaging of heart and coronary circulation: Secondary | ICD-10-CM | POA: Diagnosis not present

## 2023-09-15 DIAGNOSIS — E785 Hyperlipidemia, unspecified: Secondary | ICD-10-CM

## 2023-09-15 MED ORDER — METOPROLOL TARTRATE 50 MG PO TABS: 50.0000 mg | ORAL_TABLET | Freq: Once | ORAL | 0 refills | Status: DC

## 2023-09-15 NOTE — Addendum Note (Signed)
 Addended by: JANIT GENI CROME on: 09/15/2023 09:31 AM   Modules accepted: Orders

## 2023-09-15 NOTE — Patient Instructions (Addendum)
 Medication Instructions:  Your physician recommends that you continue on your current medications as directed. Please refer to the Current Medication list given to you today.   *If you need a refill on your cardiac medications before your next appointment, please call your pharmacy*  Lab Work: Please complete a BMET in our first floor lab before you leave today. You do not need to be fasting for this test.  If you have labs (blood work) drawn today and your tests are completely normal, you will receive your results only by: MyChart Message (if you have MyChart) OR A paper copy in the mail If you have any lab test that is abnormal or we need to change your treatment, we will call you to review the results.  Testing/Procedures: Your physician has requested that you have cardiac CT. Cardiac computed tomography (CT) is a painless test that uses an x-ray machine to take clear, detailed pictures of your heart. For further information please visit [email or web address] Please follow instruction sheet as given.   Follow-Up: At [facility], you and your health needs are our priority.  As part of our continuing mission to provide you with exceptional heart care, our providers are all part of one team.  This team includes your primary Cardiologist (physician) and Advanced Practice Providers or APPs (Physician Assistants and Nurse Practitioners) who all work together to provide you with the care you need, when you need it.  Your next appointment:   1 year(s)  Provider:   Wilbert Bihari, MD        Your cardiac CT will be scheduled at:   [facility] [address]  [place], [place] [address]   Please enter the parking lot using the [address] entrance and use the FREE valet service at the patient drop-off area. Enter the buidling and check-in with registration on the main floor.  Please follow these instructions carefully (unless otherwise  directed):  An IV will be required for this test and Nitroglycerin  will be given.   On the Night Before the Test: Be sure to Drink plenty of water. Do not consume any caffeinated/decaffeinated beverages or chocolate 12 hours prior to your test. Do not take any antihistamines 12 hours prior to your test.  On the Day of the Test: Drink plenty of water until 1 hour prior to the test. Do not eat any food 1 hour prior to test. You may take your regular medications prior to the test.  Take metoprolol  (Lopressor ) 50 mg two hours prior to test. If you take Furosemide/Hydrochlorothiazide/Spironolactone /Chlorthalidone, please HOLD on the morning of the test. Patients who wear a continuous glucose monitor MUST remove the device prior to scanning. FEMALES- please wear underwire-free bra if available, avoid dresses & tight clothing      After the Test: Drink plenty of water. After receiving IV contrast, you may experience a mild flushed feeling. This is normal. On occasion, you may experience a mild rash up to 24 hours after the test. This is not dangerous. If this occurs, you can take Benadryl 25 mg, Zyrtec , Claritin, or Allegra and increase your fluid intake. (Patients taking Tikosyn should avoid Benadryl, and may take Zyrtec , Claritin, or Allegra) If you experience trouble breathing, this can be serious. If it is severe call 911 IMMEDIATELY. If it is mild, please call our office.  We will call to schedule your test 2-4 weeks out understanding that some insurance companies will need an authorization prior to the service  being performed.   For more information and frequently asked questions, please visit our website : [email or web address]  For non-scheduling related questions, please contact the cardiac imaging nurse navigator should you have any questions/concerns: Cardiac Imaging Nurse Navigators Direct Office Dial: [phone]   For scheduling needs, including cancellations and  rescheduling, please call [place], [phone].

## 2023-09-15 NOTE — Progress Notes (Signed)
 Cardiology Office Note    Date:  09/15/2023   ID:  [name], DOB 06/29/1955, MRN [id number]  PCP:  Antonio Meth, Jamee SAUNDERS, DO  Cardiologist:  Wilbert Bihari, MD   Chief Complaint  Patient presents with   Coronary Artery Disease   Hypertension   Hyperlipidemia     History of Present Illness:  [name] is a 68 y.o. female with a hx of chronic cough (followed with Pulmonary), HTN, spontaneous PTX s/p VATS and coronary artery calcifications. She underwent HRCT for chronic cough and was found to have coronary atherosclerosis in the RCA and LAD.   Coronary Ca score was obtained that was 38 and was 69th% for age and sex matched controls.  Lexiscan  myoview  07/2019 showed no ischemia.   She is here for follow-up today and denies any PND, orthopnea, lower extreme edema, palpitations, dizziness or syncope.  Recently she has been having chest pressure and SOB when she goes out in the humidity.  She also notices it when she is in doors as well.  She is under a lot of stress as her husband has been dx with AML and is getting frequent treatments. She describes a midsternal heaviness with no radiation.  She did have an episode in the grocery store and started feeling funny in her neck and rested and resolves. 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 Sometimes she will have some mild DOE but mainly occurs when she has the heaviness in her chest.   Past Medical History:  Diagnosis Date   Allergy    Cataract    Chronic cough 01/24/2019   Costochondritis 01/24/2019   Emphysema of lung (HCC) 2020   Mild emphysema   Epigastric pain 07/29/2016   FIBROIDS, UTERUS 01/18/2007   Qualifier: Diagnosis of  By: Antonio ROSALEA Jamee     H/O Bell's palsy 05/2010   HYPERTENSION 03/24/2007   Qualifier: Diagnosis of  By: Joshua CMA, Chemira     MENORRHAGIA, PERIMENOPAUSAL 01/13/2007   Qualifier: Diagnosis of  By: Antonio ROSALEA Jamee     MOLE  05/07/2010   Qualifier: Diagnosis of  By: Antonio ROSALEA Jamee     Osteopenia 08/23/2014   Osteoporosis 07/2019   RUQ pain 07/29/2016   Spontaneous pneumothorax 2005   R, s/p tube, then VATS   Spontaneous tension pneumothorax 03/24/2007   Qualifier: Diagnosis of  By: Antonio ROSALEA Jamee      Past Surgical History:  Procedure Laterality Date   BRONCHIAL WASHINGS  08/06/2019   Procedure: BRONCHIAL lavage;  Surgeon: Geronimo Amel, MD;  Location: [facility];  Service: Endoscopy;;   CATARACT EXTRACTION W/ INTRAOCULAR LENS IMPLANT Bilateral    june 2023   CESAREAN SECTION     CHEST TUBE INSERTION  2005   spontaneus Pneumothorax   COLONOSCOPY     EYE SURGERY  07/2020   Cararacts   SPIROMETRY  01/20/2015   Dr. Frutoso   VIDEO ASSISTED THORACOSCOPY  2005   VIDEO BRONCHOSCOPY N/A 08/06/2019   Procedure: VIDEO BRONCHOSCOPY WITHOUT FLUORO;  Surgeon: Geronimo Amel, MD;  Location: [facility];  Service: Endoscopy;  Laterality: N/A;   WISDOM TOOTH EXTRACTION      Current Medications: Current Meds  Medication Sig   acetaminophen (TYLENOL) 500 MG tablet Take 500 mg by mouth every 6 (six) hours as needed for moderate pain or headache.   albuterol (VENTOLIN HFA) 108 (90 Base) MCG/ACT inhaler  Inhale 1-2 puffs into the lungs every 6 (six) hours as needed for wheezing or shortness of breath.   aspirin  EC 81 MG tablet Take 81 mg by mouth daily. Swallow whole.   atorvastatin  (LIPITOR) 20 MG tablet Take 1 tablet (20 mg total) by mouth daily.   azelastine  (ASTELIN ) 0.1 % nasal spray Place 1 spray into both nostrils 2 (two) times daily.   denosumab  (PROLIA ) 60 MG/ML SOSY injection Inject 60 mg into the skin every 6 (six) months. 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  pt has appt on 09/22/23.   FIBER PO Take by mouth daily. Either Benefiber or Metamucil   fluticasone  (FLONASE) 50 MCG/ACT nasal spray Place 1 spray into both nostrils daily.   gabapentin  (NEURONTIN ) 300 MG capsule TAKE 1 CAPSULE BY MOUTH EVERY MONDAY,  WEDNESDAY AND FRIDAY.   levocetirizine (XYZAL) 5 MG tablet Take 5 mg by mouth every evening.    montelukast  (SINGULAIR ) 10 MG tablet Take 10 mg by mouth at bedtime.   nitroGLYCERIN  (NITROSTAT ) 0.4 MG SL tablet Place 1 tablet (0.4 mg total) under the tongue every 5 (five) minutes as needed for chest pain.   spironolactone  (ALDACTONE ) 25 MG tablet Take 1 tablet (25 mg total) by mouth once for 1 dose.   UNABLE TO FIND Take by mouth daily. Med Name: Wenda   valsartan  (DIOVAN ) 160 MG tablet TAKE 1 TABLET(160 MG) BY MOUTH DAILY   Vitamin D , Ergocalciferol , (DRISDOL ) 1.25 MG (50000 UNIT) CAPS capsule TAKE 1 CAPSULE BY MOUTH EVERY 7 DAYS   Current Facility-Administered Medications for the 09/15/23 encounter (Office Visit) with Shlomo Wilbert SAUNDERS, MD  Medication   denosumab  (PROLIA ) injection 60 mg   gi cocktail (Maalox,Lidocaine ,Donnatal)    Allergies:   Patient has no known allergies.   Social History   Socioeconomic History   Marital status: Married    Spouse name: Not on file   Number of children: 3   Years of education: Not on file   Highest education level: 12th grade  Occupational History   Occupation: southern Engineer, site    Employer: [employer]  Tobacco Use   Smoking status: Former    Current packs/day: 0.00    Average packs/day: 1 pack/day for 14.0 years (14.0 ttl pk-yrs)    Types: Cigarettes    Start date: 07/10/1968    Quit date: 07/11/1982    Years since quitting: 41.2   Smokeless tobacco: Never  Vaping Use   Vaping status: Never Used  Substance and Sexual Activity   Alcohol use: Yes    Alcohol/week: 0.0 standard drinks of alcohol    Comment: rare   Drug use: No   Sexual activity: Yes    Partners: Male    Birth control/protection: Post-menopausal    Comment: 1st intercourse 68 yo-Fewer than 5 partners  Other Topics Concern   Not on file  Social History Narrative   Exercise-- jazzersize  3x a week    Social Drivers of Research scientist (physical sciences) Strain: Low Risk  (02/10/2023)   Overall Financial Resource Strain (CARDIA)    Difficulty of Paying Living Expenses: Not very hard  Food Insecurity: No Food Insecurity (02/10/2023)   Hunger Vital Sign    Worried About Running Out of Food in the Last Year: Never true    Ran Out of Food in the Last Year: Never true  Transportation Needs: No Transportation Needs (02/10/2023)   PRAPARE - Transportation    Lack of Transportation (Medical): No    Lack of  Transportation (Non-Medical): No  Physical Activity: Sufficiently Active (02/10/2023)   Exercise Vital Sign    Days of Exercise per Week: 5 days    Minutes of Exercise per Session: 60 min  Stress: No Stress Concern Present (02/10/2023)   [employer] of Occupational Health - Occupational Stress Questionnaire    Feeling of Stress : Only a little  Social Connections: Moderately Isolated (02/10/2023)   Social Connection and Isolation Panel    Frequency of Communication with Friends and Family: Once a week    Frequency of Social Gatherings with Friends and Family: Once a week    Attends Religious Services: Never    Database administrator or Organizations: Yes    Attends Engineer, structural: More than 4 times per year    Marital Status: Married     Family History:  The patient's family history includes Arthritis in her father; COPD in her maternal grandfather; Cancer (age of onset: 59) in her maternal grandmother; Diabetes in her maternal grandmother; Heart disease in her father; Hypertension in her father and mother; Stroke in her mother.   ROS:   Please see the history of present illness.    ROS All other systems reviewed and are negative.      No data to display             PHYSICAL EXAM:   VS:  BP 138/80   Pulse 65   Ht 5' 2.5 (1.588 m)   Wt 124 lb 9.6 oz (56.5 kg)   SpO2 98%   BMI 22.43 kg/m     GEN: Well nourished, well developed in no acute distress HEENT: Normal NECK: No JVD; No carotid  bruits LYMPHATICS: No lymphadenopathy CARDIAC:RRR, no murmurs, rubs, gallops RESPIRATORY:  Clear to auscultation without rales, wheezing or rhonchi  ABDOMEN: Soft, non-tender, non-distended MUSCULOSKELETAL:  No edema; No deformity  SKIN: Warm and dry NEUROLOGIC:  Alert and oriented x 3 PSYCHIATRIC:  Normal affect  Wt Readings from Last 3 Encounters:  09/15/23 124 lb 9.6 oz (56.5 kg)  05/30/23 120 lb 9.6 oz (54.7 kg)  02/11/23 116 lb 12.8 oz (53 kg)      Studies/Labs Reviewed:  EKG Interpretation Date/Time:  Thursday September 15 2023 09:01:36 EDT Ventricular Rate:  65 PR Interval:  146 QRS Duration:  122 QT Interval:  410 QTC Calculation: 426 R Axis:   54  Text Interpretation: Normal sinus rhythm Right bundle branch block When compared with ECG of 20-Sep-2022 10:56, No significant change was found Confirmed by Shlomo Corning ([id number]) on 09/15/2023 9:15:14 AM    Recent Labs: 05/30/2023: ALT 17; BUN 11; Creatinine, Ser 0.59; Hemoglobin 13.4; Platelets 204.0; Potassium 3.8; Sodium 137; TSH 0.59   Lipid Panel    Component Value Date/Time   CHOL 134 05/30/2023 0928   CHOL 173 09/12/2019 1029   TRIG 54.0 05/30/2023 0928   HDL 65.50 05/30/2023 0928   HDL 75 09/12/2019 1029   CHOLHDL 2 05/30/2023 0928   VLDL 10.8 05/30/2023 0928   LDLCALC 58 05/30/2023 0928   LDLCALC 62 11/05/2019 1353   LDLDIRECT 111.7 05/07/2010 1333    Additional studies/ records that were reviewed today include:  Coronary Ca score and Lexi myoview     ASSESSMENT:    1. Agatston coronary artery calcium  score less than 100   2. Primary hypertension   3. Hyperlipidemia LDL goal <70   4. 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  Coronary artery calcifications -noted on HRCT for workup of cough -coronary Ca score was 38 and was 69th% for age and sex matched controls in 08/2019. -Lexiscan   myoview  07/2019 showed no ischemia -She has been having intermittent chest pressure associated with SOB that occurs at rest and when out in the humidity.  She is under a lot of stress with her husband dx with AML and very stressed over this. -Continue aspirin  81 mg daily, atorvastatin  20 mg daily with as needed refills -I am going to get a coronary CTA to assess for progression of CAD  2.  HTN -BP controlled on exam today -Continue spironolactone  25 mg daily and valsartan  160 mg daily with as needed refills -I have personally reviewed and interpreted outside labs performed by patient's PCP which showed serum creatinine 0.59 and potassium 3.8 on 05/30/2023  3.  HLD -LDL goal < 70 -I have personally reviewed and interpreted outside labs performed by patient's PCP which showed LDL 58, HDL 65 and ALT 17 on 05/30/2023 - Continue atorvastatin  20 mg daily with as needed refills  4.  Aortic Atherosclerosis -continue statin   Medication Adjustments/Labs and Tests Ordered: Current medicines are reviewed at length with the patient today.  Concerns regarding medicines are outlined above.  Medication changes, Labs and Tests ordered today are listed in the Patient Instructions below.  There are no Patient Instructions on file for this visit.   Signed, Wilbert Bihari, MD  09/15/2023 9:21 AM    [facility] [address] [place], [place], [place]  [address] Phone: [phone]; Fax: [phone]

## 2023-09-16 ENCOUNTER — Other Ambulatory Visit: Payer: Self-pay | Admitting: Cardiology

## 2023-09-16 ENCOUNTER — Ambulatory Visit: Payer: Self-pay | Admitting: Cardiology

## 2023-09-16 DIAGNOSIS — R0600 Dyspnea, unspecified: Secondary | ICD-10-CM

## 2023-09-16 LAB — BASIC METABOLIC PANEL WITH GFR
BUN/Creatinine Ratio: 13 (ref 12–28)
BUN: 8 mg/dL (ref 8–27)
CO2: 21 mmol/L (ref 20–29)
Calcium: 9.2 mg/dL (ref 8.7–10.3)
Chloride: 100 mmol/L (ref 96–106)
Creatinine, Ser: 0.62 mg/dL (ref 0.57–1.00)
Glucose: 101 mg/dL — ABNORMAL HIGH (ref 70–99)
Potassium: 4.1 mmol/L (ref 3.5–5.2)
Sodium: 138 mmol/L (ref 134–144)
eGFR: 96 mL/min/1.73 (ref >59)

## 2023-09-19 ENCOUNTER — Encounter (HOSPITAL_COMMUNITY): Payer: Self-pay

## 2023-09-20 ENCOUNTER — Ambulatory Visit (HOSPITAL_COMMUNITY)
Admission: RE | Admit: 2023-09-20 | Discharge: 2023-09-20 | Disposition: A | Discharge status: Home / Self Care | Source: Ambulatory Visit | Attending: Cardiology | Admitting: Cardiology

## 2023-09-20 ENCOUNTER — Other Ambulatory Visit: Payer: Self-pay

## 2023-09-20 DIAGNOSIS — I700 Atherosclerosis of aorta: Secondary | ICD-10-CM

## 2023-09-20 DIAGNOSIS — I2510 Atherosclerotic heart disease of native coronary artery without angina pectoris: Secondary | ICD-10-CM | POA: Diagnosis not present

## 2023-09-20 DIAGNOSIS — R0600 Dyspnea, unspecified: Secondary | ICD-10-CM | POA: Diagnosis not present

## 2023-09-20 DIAGNOSIS — I10 Essential (primary) hypertension: Secondary | ICD-10-CM

## 2023-09-20 DIAGNOSIS — R931 Abnormal findings on diagnostic imaging of heart and coronary circulation: Secondary | ICD-10-CM

## 2023-09-20 MED ORDER — NITROGLYCERIN 0.4 MG SL SUBL
0.8000 mg | SUBLINGUAL_TABLET | Freq: Once | SUBLINGUAL | Status: AC
  Administered 2023-09-20: 0.8 mg via SUBLINGUAL

## 2023-09-20 MED ORDER — SPIRONOLACTONE 25 MG PO TABS: 25.0000 mg | ORAL_TABLET | Freq: Once | ORAL | 3 refills | Status: DC

## 2023-09-20 MED ORDER — SPIRONOLACTONE 25 MG PO TABS: 25.0000 mg | ORAL_TABLET | Freq: Once | ORAL | 3 refills | Status: AC

## 2023-09-20 MED ORDER — IOHEXOL 350 MG/ML SOLN
100.0000 mL | Freq: Once | INTRAVENOUS | Status: AC | PRN
  Administered 2023-09-20: 100 mL via INTRAVENOUS

## 2023-09-21 ENCOUNTER — Encounter: Payer: Self-pay | Admitting: Cardiology

## 2023-09-22 ENCOUNTER — Ambulatory Visit (INDEPENDENT_AMBULATORY_CARE_PROVIDER_SITE_OTHER): Admitting: *Deleted

## 2023-09-22 DIAGNOSIS — M810 Age-related osteoporosis without current pathological fracture: Secondary | ICD-10-CM

## 2023-09-22 MED ORDER — DENOSUMAB 60 MG/ML ~~LOC~~ SOSY: 60.0000 mg | PREFILLED_SYRINGE | SUBCUTANEOUS | Status: AC

## 2023-09-22 NOTE — Progress Notes (Signed)
 Patient here for Prolia injection per physicians orders  Prolia 60 mg SQ , was administered left arm today. Patient tolerated injection.  Patient next injection due: 6 months, appt made:   No- will schedule in 5 months after benefits are ran again  Initial injection: no  Did Prolia come from pharmacy (if yes please select patient supplied): yes  Cam placed for next injection yes

## 2023-09-28 NOTE — Telephone Encounter (Signed)
 Call to patient to advise that coronary CTA showed a coronary calcium  score of 134 with <25% mid LAD/proximal to mid RCA/ostial LCx.  advised that per Dr. Shlomo, Her chest pain and shortness of breath are unlikely cardiac.  Pateint agrees to have PFTs with [facility] as well as a 2D echo. Orders placed. Patient agrees to continue aspirin  81 mg daily,atorvastatin  20 mg daily.

## 2023-09-28 NOTE — Telephone Encounter (Signed)
-----   Message from Wilbert Bihari sent at 09/21/2023  8:26 PM EDT ----- Coronary CTA showed a coronary calcium  score of 134 with <25% mid LAD/proximal to mid RCA/ostial LCx.  Labs 05/30/2023 showed an LDL of 58, HDL 65 and ALT 17.  Continue aspirin  81 mg daily,  atorvastatin  20 mg daily.  Her chest pain and shortness of breath are unlikely cardiac.  I would like her to have PFTs with [facility] as well as a 2D echo.  Otherwise follow-up with me in 1 year ----- Message ----- From: Interface, Rad Results In Sent: 09/21/2023  11:56 AM EDT To: Wilbert JONELLE Bihari, MD

## 2023-10-22 ENCOUNTER — Other Ambulatory Visit: Payer: Self-pay | Admitting: Cardiology

## 2023-10-25 ENCOUNTER — Other Ambulatory Visit: Payer: Self-pay | Admitting: Cardiology

## 2023-10-25 ENCOUNTER — Ambulatory Visit (HOSPITAL_BASED_OUTPATIENT_CLINIC_OR_DEPARTMENT_OTHER)
Admission: RE | Admit: 2023-10-25 | Discharge: 2023-10-25 | Disposition: A | Discharge status: Home / Self Care | Source: Ambulatory Visit | Attending: Cardiology | Admitting: Cardiology

## 2023-10-25 DIAGNOSIS — R0600 Dyspnea, unspecified: Secondary | ICD-10-CM | POA: Diagnosis not present

## 2023-10-25 DIAGNOSIS — R0609 Other forms of dyspnea: Secondary | ICD-10-CM | POA: Diagnosis not present

## 2023-10-25 DIAGNOSIS — I10 Essential (primary) hypertension: Secondary | ICD-10-CM

## 2023-10-25 LAB — ECHOCARDIOGRAM COMPLETE
AR max vel: 2.47 cm2
AV Area VTI: 2.36 cm2
AV Area mean vel: 2.31 cm2
AV Mean grad: 3.5 mmHg
AV Peak grad: 6.6 mmHg
Ao pk vel: 1.29 m/s
Area-P 1/2: 3.54 cm2
Calc EF: 60.9 %
MV M vel: 3.75 m/s
MV Peak grad: 56.3 mmHg
S' Lateral: 2.1 cm
Single Plane A2C EF: 60.7 %
Single Plane A4C EF: 60.6 %

## 2023-10-26 ENCOUNTER — Ambulatory Visit (HOSPITAL_COMMUNITY)
Admission: RE | Admit: 2023-10-26 | Discharge: 2023-10-26 | Disposition: A | Discharge status: Home / Self Care | Source: Ambulatory Visit | Attending: Cardiology | Admitting: Cardiology

## 2023-10-26 ENCOUNTER — Ambulatory Visit: Payer: Self-pay | Admitting: Cardiology

## 2023-10-26 ENCOUNTER — Encounter: Payer: Self-pay | Admitting: Cardiology

## 2023-10-26 DIAGNOSIS — I351 Nonrheumatic aortic (valve) insufficiency: Secondary | ICD-10-CM | POA: Insufficient documentation

## 2023-10-26 DIAGNOSIS — R0600 Dyspnea, unspecified: Secondary | ICD-10-CM | POA: Diagnosis not present

## 2023-10-26 LAB — PULMONARY FUNCTION TEST
DL/VA % pred: 98 %
DL/VA: 4.14 ml/min/mmHg/L
DLCO unc % pred: 90 %
DLCO unc: 16.73 ml/min/mmHg
FEF 25-75 Pre: 1.37 L/s
FEF2575-%Pred-Pre: 75 %
FEV1-%Pred-Pre: 90 %
FEV1-Pre: 1.93 L
FEV1FVC-%Pred-Pre: 97 %
FEV6-%Pred-Pre: 97 %
FEV6-Pre: 2.61 L
FEV6FVC-%Pred-Pre: 104 %
FVC-%Pred-Pre: 92 %
FVC-Pre: 2.61 L
Pre FEV1/FVC ratio: 74 %
Pre FEV6/FVC Ratio: 100 %
RV % pred: 114 %
RV: 2.42 L
TLC % pred: 95 %
TLC: 4.61 L

## 2023-11-18 DIAGNOSIS — H40013 Open angle with borderline findings, low risk, bilateral: Secondary | ICD-10-CM | POA: Diagnosis not present

## 2023-11-18 DIAGNOSIS — H348322 Tributary (branch) retinal vein occlusion, left eye, stable: Secondary | ICD-10-CM | POA: Diagnosis not present

## 2023-11-18 DIAGNOSIS — H18413 Arcus senilis, bilateral: Secondary | ICD-10-CM | POA: Diagnosis not present

## 2023-11-18 DIAGNOSIS — H26492 Other secondary cataract, left eye: Secondary | ICD-10-CM | POA: Diagnosis not present

## 2023-11-18 DIAGNOSIS — Z961 Presence of intraocular lens: Secondary | ICD-10-CM | POA: Diagnosis not present

## 2023-11-23 HISTORY — PX: YAG LASER APPLICATION: SHX6189

## 2023-12-01 ENCOUNTER — Ambulatory Visit: Payer: Medicare PPO | Admitting: Family Medicine

## 2023-12-01 ENCOUNTER — Encounter: Payer: Self-pay | Admitting: Family Medicine

## 2023-12-01 VITALS — BP 120/70 | HR 64 | Temp 97.8°F | Resp 16 | Ht 62.5 in | Wt 126.2 lb

## 2023-12-01 DIAGNOSIS — I10 Essential (primary) hypertension: Secondary | ICD-10-CM

## 2023-12-01 DIAGNOSIS — Z0000 Encounter for general adult medical examination without abnormal findings: Secondary | ICD-10-CM

## 2023-12-01 DIAGNOSIS — I700 Atherosclerosis of aorta: Secondary | ICD-10-CM | POA: Diagnosis not present

## 2023-12-01 DIAGNOSIS — E559 Vitamin D deficiency, unspecified: Secondary | ICD-10-CM | POA: Diagnosis not present

## 2023-12-01 DIAGNOSIS — Z23 Encounter for immunization: Secondary | ICD-10-CM

## 2023-12-01 DIAGNOSIS — E785 Hyperlipidemia, unspecified: Secondary | ICD-10-CM

## 2023-12-01 LAB — CBC WITH DIFFERENTIAL/PLATELET
Basophils Absolute: 0 K/uL (ref 0.0–0.1)
Basophils Relative: 0.8 % (ref 0.0–3.0)
Eosinophils Absolute: 0.1 K/uL (ref 0.0–0.7)
Eosinophils Relative: 3.2 % (ref 0.0–5.0)
HCT: 39.1 % (ref 36.0–46.0)
Hemoglobin: 13.4 g/dL (ref 12.0–15.0)
Lymphocytes Relative: 34.7 % (ref 12.0–46.0)
Lymphs Abs: 1.4 K/uL (ref 0.7–4.0)
MCHC: 34.2 g/dL (ref 30.0–36.0)
MCV: 87.6 fl (ref 78.0–100.0)
Monocytes Absolute: 0.3 K/uL (ref 0.1–1.0)
Monocytes Relative: 7.2 % (ref 3.0–12.0)
Neutro Abs: 2.2 K/uL (ref 1.4–7.7)
Neutrophils Relative %: 54.1 % (ref 43.0–77.0)
Platelets: 207 K/uL (ref 150.0–400.0)
RBC: 4.46 Mil/uL (ref 3.87–5.11)
RDW: 12.8 % (ref 11.5–15.5)
WBC: 4 K/uL (ref 4.0–10.5)

## 2023-12-01 LAB — COMPREHENSIVE METABOLIC PANEL WITH GFR
ALT: 18 U/L (ref 0–35)
AST: 16 U/L (ref 0–37)
Albumin: 4.5 g/dL (ref 3.5–5.2)
Alkaline Phosphatase: 67 U/L (ref 39–117)
BUN: 15 mg/dL (ref 6–23)
CO2: 31 meq/L (ref 19–32)
Calcium: 9 mg/dL (ref 8.4–10.5)
Chloride: 97 meq/L (ref 96–112)
Creatinine, Ser: 0.56 mg/dL (ref 0.40–1.20)
GFR: 92.31 mL/min (ref >60.00)
Glucose, Bld: 100 mg/dL — ABNORMAL HIGH (ref 70–99)
Potassium: 3.9 meq/L (ref 3.5–5.1)
Sodium: 136 meq/L (ref 135–145)
Total Bilirubin: 0.5 mg/dL (ref 0.2–1.2)
Total Protein: 7 g/dL (ref 6.0–8.3)

## 2023-12-01 LAB — LIPID PANEL
Cholesterol: 138 mg/dL (ref 0–200)
HDL: 65.7 mg/dL (ref >39.00)
LDL Cholesterol: 61 mg/dL (ref 0–99)
NonHDL: 72.71
Total CHOL/HDL Ratio: 2
Triglycerides: 61 mg/dL (ref 0.0–149.0)
VLDL: 12.2 mg/dL (ref 0.0–40.0)

## 2023-12-01 LAB — VITAMIN D 25 HYDROXY (VIT D DEFICIENCY, FRACTURES): VITD: 30.23 ng/mL (ref 30.00–100.00)

## 2023-12-01 LAB — TSH: TSH: 0.68 u[IU]/mL (ref 0.35–5.50)

## 2023-12-01 NOTE — Assessment & Plan Note (Signed)
 Encourage heart healthy diet such as MIND or DASH diet, increase exercise, avoid trans fats, simple carbohydrates and processed foods, consider a krill or fish or flaxseed oil cap daily.

## 2023-12-01 NOTE — Progress Notes (Signed)
 Subjective:    Patient ID: [name], female    DOB: [birth year], 68 y.o.   MRN: [id number]  Chief Complaint  Patient presents with   Annual Exam    Pt states fasting     HPI Patient is in today for cpe.  Discussed the use of AI scribe software for clinical note transcription with the patient, who gave verbal consent to proceed.  History of Present Illness [name] is a 68 year old female who presents for an annual physical exam.  She is currently taking valsartan , spironolactone , Singulair , Lipitor, aspirin , and albuterol. She does not have a current prescription for nitroglycerin . She reports that recent scans showed plaque buildup on the walls but that it was not impeding flow, according to her doctor.  She underwent YAG laser capsulotomy on her left eye two weeks ago and is scheduled for the right eye tomorrow. She describes significant improvement in vision post-procedure, stating it was 'a thousand percent better.' The right eye is not as bad as the left was, but it is starting to develop cloudiness.  She is experiencing allergies this year, with nasal congestion and frequent nose blowing, which is unusual for her.  She is retired and spends a significant amount of time caring for her husband, who has AML and other health issues. She drives him to medical appointments and manages his care, including dealing with his low white blood cell and platelet counts, which restricts his activities and social interactions.  She continues to stay active by participating in live-streamed jazzercise classes at home three to five times a week, depending on her schedule.  She received her flu shot. No new moles or concerns about her skin. Occasional hip pain but stays active. Her stomach is okay.    Past Medical History:  Diagnosis Date   Allergy    Aortic insufficiency    mild by echo 10/2023   CAD (coronary artery disease), native coronary artery    Coronary CTA showed a coronary  calcium  score of 134 with <25% mid LAD/proximal to mid RCA/ostial LCx.   Cataract    Chronic cough 01/24/2019   Costochondritis 01/24/2019   Emphysema of lung (HCC) 2020   Mild emphysema   Epigastric pain 07/29/2016   FIBROIDS, UTERUS 01/18/2007   Qualifier: Diagnosis of  By: Antonio ROSALEA Rockers     H/O Bell's palsy 05/2010   HYPERTENSION 03/24/2007   Qualifier: Diagnosis of  By: Joshua CMA, Chemira     MENORRHAGIA, PERIMENOPAUSAL 01/13/2007   Qualifier: Diagnosis of  By: Antonio ROSALEA Rockers     MOLE 05/07/2010   Qualifier: Diagnosis of  By: Antonio ROSALEA Rockers     Osteopenia 08/23/2014   Osteoporosis 07/2019   RBBB (right bundle branch block with left anterior fascicular block)    RUQ pain 07/29/2016   Spontaneous pneumothorax 2005   R, s/p tube, then VATS   Spontaneous tension pneumothorax 03/24/2007   Qualifier: Diagnosis of  By: Antonio ROSALEA Rockers      Past Surgical History:  Procedure Laterality Date   BRONCHIAL WASHINGS  08/06/2019   Procedure: BRONCHIAL lavage;  Surgeon: Geronimo Amel, MD;  Location: [facility];  Service: Endoscopy;;   CATARACT EXTRACTION W/ INTRAOCULAR LENS IMPLANT Bilateral    june 2023   CESAREAN SECTION     CHEST TUBE INSERTION  2005   spontaneus Pneumothorax   COLONOSCOPY     EYE SURGERY  07/2020   Cararacts   EYE SURGERY Bilateral  yag-- Dr Milan   SPIROMETRY  01/20/2015   Dr. Frutoso   VIDEO ASSISTED THORACOSCOPY  2005   VIDEO BRONCHOSCOPY N/A 08/06/2019   Procedure: VIDEO BRONCHOSCOPY WITHOUT FLUORO;  Surgeon: Geronimo Amel, MD;  Location: [facility];  Service: Endoscopy;  Laterality: N/A;   WISDOM TOOTH EXTRACTION      Family History  Problem Relation Age of Onset   Hypertension Mother    Stroke Mother    Heart disease Father    Hypertension Father    Arthritis Father    Cancer Maternal Grandmother 47       breast   Diabetes Maternal Grandmother    COPD Maternal Grandfather    Colon cancer Neg Hx    Esophageal cancer Neg  Hx    Rectal cancer Neg Hx    Stomach cancer Neg Hx     Social History   Socioeconomic History   Marital status: Married    Spouse name: Not on file   Number of children: 3   Years of education: Not on file   Highest education level: 12th grade  Occupational History   Occupation: southern Engineer, site    Employer: [employer]    Comment: retired  Tobacco Use   Smoking status: Former    Current packs/day: 0.00    Average packs/day: 1 pack/day for 14.0 years (14.0 ttl pk-yrs)    Types: Cigarettes    Start date: 07/10/1968    Quit date: 07/11/1982    Years since quitting: 41.4   Smokeless tobacco: Never  Vaping Use   Vaping status: Never Used  Substance and Sexual Activity   Alcohol use: Yes    Alcohol/week: 0.0 standard drinks of alcohol    Comment: rare   Drug use: No   Sexual activity: Yes    Partners: Male    Birth control/protection: Post-menopausal    Comment: 1st intercourse 68 yo-Fewer than 5 partners  Other Topics Concern   Not on file  Social History Narrative   Exercise-- jazzersize  3x a week    Social Drivers of Corporate investment banker Strain: Low Risk  (02/10/2023)   Overall Financial Resource Strain (CARDIA)    Difficulty of Paying Living Expenses: Not very hard  Food Insecurity: No Food Insecurity (02/10/2023)   Hunger Vital Sign    Worried About Running Out of Food in the Last Year: Never true    Ran Out of Food in the Last Year: Never true  Transportation Needs: No Transportation Needs (02/10/2023)   PRAPARE - Administrator, Civil Service (Medical): No    Lack of Transportation (Non-Medical): No  Physical Activity: Sufficiently Active (02/10/2023)   Exercise Vital Sign    Days of Exercise per Week: 5 days    Minutes of Exercise per Session: 60 min  Stress: No Stress Concern Present (02/10/2023)   [employer] of Occupational Health - Occupational Stress Questionnaire    Feeling of Stress : Only a  little  Social Connections: Moderately Isolated (02/10/2023)   Social Connection and Isolation Panel    Frequency of Communication with Friends and Family: Once a week    Frequency of Social Gatherings with Friends and Family: Once a week    Attends Religious Services: Never    Database administrator or Organizations: Yes    Attends Banker Meetings: More than 4 times per year    Marital Status: Married  Catering manager Violence: Not At Risk (01/05/2023)  Humiliation, Afraid, Rape, and Kick questionnaire    Fear of Current or Ex-Partner: No    Emotionally Abused: No    Physically Abused: No    Sexually Abused: No    Outpatient Medications Prior to Visit  Medication Sig Dispense Refill   acetaminophen (TYLENOL) 500 MG tablet Take 500 mg by mouth every 6 (six) hours as needed for moderate pain or headache.     albuterol (VENTOLIN HFA) 108 (90 Base) MCG/ACT inhaler Inhale 1-2 puffs into the lungs every 6 (six) hours as needed for wheezing or shortness of breath.     aspirin  EC 81 MG tablet Take 81 mg by mouth daily. Swallow whole.     atorvastatin  (LIPITOR) 20 MG tablet TAKE 1 TABLET(20 MG) BY MOUTH DAILY 90 tablet 3   azelastine  (ASTELIN ) 0.1 % nasal spray Place 1 spray into both nostrils 2 (two) times daily.     denosumab  (PROLIA ) 60 MG/ML SOSY injection Inject 60 mg into the skin every 6 (six) months. 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  pt has appt on 09/22/23. 1 mL 0   FIBER PO Take by mouth daily. Either Benefiber or Metamucil     fluticasone  (FLONASE) 50 MCG/ACT nasal spray Place 1 spray into both nostrils daily.     gabapentin  (NEURONTIN ) 300 MG capsule TAKE 1 CAPSULE BY MOUTH EVERY MONDAY, WEDNESDAY AND FRIDAY. 30 capsule 0   levocetirizine (XYZAL) 5 MG tablet Take 5 mg by mouth every evening.      montelukast  (SINGULAIR ) 10 MG tablet Take 10 mg by mouth at bedtime.     UNABLE TO FIND Take by mouth daily. Med Name: ViActiv     valsartan  (DIOVAN ) 160 MG tablet TAKE 1 TABLET(160 MG) BY  MOUTH DAILY 90 tablet 3   Vitamin D , Ergocalciferol , (DRISDOL ) 1.25 MG (50000 UNIT) CAPS capsule TAKE 1 CAPSULE BY MOUTH EVERY 7 DAYS 12 capsule 1   nitroGLYCERIN  (NITROSTAT ) 0.4 MG SL tablet Place 1 tablet (0.4 mg total) under the tongue every 5 (five) minutes as needed for chest pain. 90 tablet 3   spironolactone  (ALDACTONE ) 25 MG tablet Take 1 tablet (25 mg total) by mouth once for 1 dose. 90 tablet 3   metoprolol  tartrate (LOPRESSOR ) 50 MG tablet Take 1 tablet (50 mg total) by mouth once for 1 dose. Take 90-120 minutes prior to scan. Hold for SBP less than 110. 1 tablet 0   Facility-Administered Medications Prior to Visit  Medication Dose Route Frequency Provider Last Rate Last Admin   [START ON 03/20/2024] denosumab  (PROLIA ) injection 60 mg  60 mg Subcutaneous Q6 months Lowne Chase, Jakub Debold R, DO       gi cocktail (Maalox,Lidocaine ,Donnatal)  30 mL Oral Once Lowne Chase, Beni Turrell R, DO        No Known Allergies  Review of Systems  Constitutional:  Negative for fever and malaise/fatigue.  HENT:  Negative for congestion.   Eyes:  Negative for blurred vision.  Respiratory:  Negative for cough and shortness of breath.   Cardiovascular:  Negative for chest pain, palpitations and leg swelling.  Gastrointestinal:  Negative for abdominal pain, blood in stool, nausea and vomiting.  Genitourinary:  Negative for dysuria and frequency.  Musculoskeletal:  Negative for back pain and falls.  Skin:  Negative for rash.  Neurological:  Negative for dizziness, loss of consciousness and headaches.  Endo/Heme/Allergies:  Negative for environmental allergies.  Psychiatric/Behavioral:  Negative for depression. The patient is not nervous/anxious.        Objective:  Physical Exam Vitals and nursing note reviewed.  Constitutional:      General: She is not in acute distress.    Appearance: Normal appearance. She is well-developed.  HENT:     Head: Normocephalic and atraumatic.     Right Ear: Tympanic  membrane, ear canal and external ear normal. There is no impacted cerumen.     Left Ear: Tympanic membrane, ear canal and external ear normal. There is no impacted cerumen.     Nose: Nose normal.     Mouth/Throat:     Mouth: Mucous membranes are moist.     Pharynx: Oropharynx is clear. No oropharyngeal exudate or posterior oropharyngeal erythema.  Eyes:     General: No scleral icterus.       Right eye: No discharge.        Left eye: No discharge.     Conjunctiva/sclera: Conjunctivae normal.     Pupils: Pupils are equal, round, and reactive to light.  Neck:     Thyroid : No thyromegaly or thyroid  tenderness.     Vascular: No JVD.  Cardiovascular:     Rate and Rhythm: Normal rate and regular rhythm.     Heart sounds: Normal heart sounds. No murmur heard. Pulmonary:     Effort: Pulmonary effort is normal. No respiratory distress.     Breath sounds: Normal breath sounds.  Abdominal:     General: Bowel sounds are normal. There is no distension.     Palpations: Abdomen is soft. There is no mass.     Tenderness: There is no abdominal tenderness. There is no guarding or rebound.  Musculoskeletal:        General: Normal range of motion.     Cervical back: Normal range of motion and neck supple.     Right lower leg: No edema.     Left lower leg: No edema.  Lymphadenopathy:     Cervical: No cervical adenopathy.  Skin:    General: Skin is warm and dry.     Findings: No erythema or rash.  Neurological:     Mental Status: She is alert and oriented to person, place, and time.     Cranial Nerves: No cranial nerve deficit.     Deep Tendon Reflexes: Reflexes are normal and symmetric.  Psychiatric:        Mood and Affect: Mood normal.        Behavior: Behavior normal.        Thought Content: Thought content normal.        Judgment: Judgment normal.     BP 120/70 (BP Location: Right Arm, Patient Position: Sitting, Cuff Size: Normal)   Pulse 64   Temp 97.8 F (36.6 C) (Oral)   Resp 16    Ht 5' 2.5 (1.588 m)   Wt 126 lb 3.2 oz (57.2 kg)   SpO2 97%   BMI 22.71 kg/m  Wt Readings from Last 3 Encounters:  12/01/23 126 lb 3.2 oz (57.2 kg)  09/15/23 124 lb 9.6 oz (56.5 kg)  05/30/23 120 lb 9.6 oz (54.7 kg)    Diabetic Foot Exam - Simple   No data filed    Lab Results  Component Value Date   WBC 4.3 05/30/2023   HGB 13.4 05/30/2023   HCT 38.5 05/30/2023   PLT 204.0 05/30/2023   GLUCOSE 101 (H) 09/15/2023   CHOL 134 05/30/2023   TRIG 54.0 05/30/2023   HDL 65.50 05/30/2023   LDLDIRECT 111.7 05/07/2010   LDLCALC 58 05/30/2023  ALT 17 05/30/2023   AST 15 05/30/2023   NA 138 09/15/2023   K 4.1 09/15/2023   CL 100 09/15/2023   CREATININE 0.62 09/15/2023   BUN 8 09/15/2023   CO2 21 09/15/2023   TSH 0.59 05/30/2023   MICROALBUR 0.2 02/21/2014    Lab Results  Component Value Date   TSH 0.59 05/30/2023   Lab Results  Component Value Date   WBC 4.3 05/30/2023   HGB 13.4 05/30/2023   HCT 38.5 05/30/2023   MCV 88.1 05/30/2023   PLT 204.0 05/30/2023   Lab Results  Component Value Date   NA 138 09/15/2023   K 4.1 09/15/2023   CO2 21 09/15/2023   GLUCOSE 101 (H) 09/15/2023   BUN 8 09/15/2023   CREATININE 0.62 09/15/2023   BILITOT 0.7 05/30/2023   ALKPHOS 63 05/30/2023   AST 15 05/30/2023   ALT 17 05/30/2023   PROT 7.0 05/30/2023   ALBUMIN 4.6 05/30/2023   CALCIUM  9.2 09/15/2023   ANIONGAP 8 01/24/2021   EGFR 96 09/15/2023   GFR 91.48 05/30/2023   Lab Results  Component Value Date   CHOL 134 05/30/2023   Lab Results  Component Value Date   HDL 65.50 05/30/2023   Lab Results  Component Value Date   LDLCALC 58 05/30/2023   Lab Results  Component Value Date   TRIG 54.0 05/30/2023   Lab Results  Component Value Date   CHOLHDL 2 05/30/2023   No results found for: HGBA1C     Assessment & Plan:  Preventative health care Assessment & Plan: Ghm utd Check labs  See AVS  Health Maintenance  Topic Date Due   Influenza Vaccine   09/23/2023   COVID-19 Vaccine (7 - 2025-26 season) 10/24/2023   Medicare Annual Wellness (AWV)  01/05/2024   Mammogram  08/07/2024   DEXA SCAN  08/09/2025   Colonoscopy  10/27/2027   DTaP/Tdap/Td (5 - Td or Tdap) 02/02/2029   Pneumococcal Vaccine: 50+ Years  Completed   Hepatitis C Screening  Completed   Zoster Vaccines- Shingrix  Completed   Meningococcal B Vaccine  Aged Out      Need for influenza vaccination -     Flu vaccine HIGH DOSE PF(Fluzone Trivalent)  Primary hypertension Assessment & Plan: Well controlled, no changes to meds. Encouraged heart healthy diet such as the DASH diet and exercise as tolerated.    Orders: -     CBC with Differential/Platelet -     Comprehensive metabolic panel with GFR -     Lipid panel -     TSH  Hyperlipidemia, unspecified hyperlipidemia type Assessment & Plan: Encourage heart healthy diet such as MIND or DASH diet, increase exercise, avoid trans fats, simple carbohydrates and processed foods, consider a krill or fish or flaxseed oil cap daily.    Orders: -     CBC with Differential/Platelet -     Lipid panel  Vitamin D  deficiency -     VITAMIN D  25 Hydroxy (Vit-D Deficiency, Fractures)  Aortic atherosclerosis  Assessment and Plan Assessment & Plan Allergic rhinitis   Chronic allergic rhinitis has worsened this year, presenting with nasal congestion and rhinorrhea, likely due to seasonal allergens like ragweed.  Status post cataract surgery with YAG capsulotomy   A successful YAG laser capsulotomy for posterior capsule opacification in the left eye has significantly improved her vision. The right eye will undergo the same procedure tomorrow due to developing cloudiness. Proceed with the YAG capsulotomy for the right eye  as scheduled.  Hypertension   Hypertension is managed with valsartan  and spironolactone . Recent scans show no significant arterial blockage, so nitroglycerin  is not prescribed.  Hyperlipidemia   Hyperlipidemia  is managed with Lipitor.  General Health Maintenance   She received a flu shot. The COVID-19 vaccine is available downstairs. The RSV vaccine was likely received at [facility], and confirmation of this is pending. Confirm RSV vaccination status with [facility].     Kailin Leu R Lowne Chase, DO

## 2023-12-01 NOTE — Assessment & Plan Note (Signed)
 Ghm utd Check labs  See AVS  Health Maintenance  Topic Date Due   Influenza Vaccine  09/23/2023   COVID-19 Vaccine (7 - 2025-26 season) 10/24/2023   Medicare Annual Wellness (AWV)  01/05/2024   Mammogram  08/07/2024   DEXA SCAN  08/09/2025   Colonoscopy  10/27/2027   DTaP/Tdap/Td (5 - Td or Tdap) 02/02/2029   Pneumococcal Vaccine: 50+ Years  Completed   Hepatitis C Screening  Completed   Zoster Vaccines- Shingrix  Completed   Meningococcal B Vaccine  Aged Out

## 2023-12-01 NOTE — Assessment & Plan Note (Signed)
 Well controlled, no changes to meds. Encouraged heart healthy diet such as the DASH diet and exercise as tolerated.

## 2023-12-02 DIAGNOSIS — H26491 Other secondary cataract, right eye: Secondary | ICD-10-CM | POA: Diagnosis not present

## 2023-12-08 ENCOUNTER — Other Ambulatory Visit (HOSPITAL_BASED_OUTPATIENT_CLINIC_OR_DEPARTMENT_OTHER): Payer: Self-pay

## 2023-12-08 MED ORDER — COMIRNATY 30 MCG/0.3ML IM SUSY
0.3000 mL | PREFILLED_SYRINGE | Freq: Once | INTRAMUSCULAR | 0 refills | Status: AC
  Filled 2023-12-08: qty 0.3, 1d supply, fill #0

## 2023-12-09 ENCOUNTER — Ambulatory Visit: Payer: Self-pay | Admitting: Family

## 2023-12-18 ENCOUNTER — Encounter: Payer: Self-pay | Admitting: Family Medicine

## 2023-12-19 ENCOUNTER — Telehealth: Admitting: Family Medicine

## 2023-12-19 DIAGNOSIS — U071 COVID-19: Secondary | ICD-10-CM | POA: Diagnosis not present

## 2023-12-19 MED ORDER — NIRMATRELVIR/RITONAVIR (PAXLOVID)TABLET: 3.0000 | ORAL_TABLET | Freq: Two times a day (BID) | ORAL | 0 refills | Status: AC

## 2023-12-19 NOTE — Progress Notes (Signed)
 Virtual Visit Consent   [name], you are scheduled for a virtual visit with a  provider today. Just as with appointments in the office, your consent must be obtained to participate. Your consent will be active for this visit and any virtual visit you may have with one of our providers in the next 365 days. If you have a MyChart account, a copy of this consent can be sent to you electronically.  As this is a virtual visit, video technology does not allow for your provider to perform a traditional examination. This may limit your provider's ability to fully assess your condition. If your provider identifies any concerns that need to be evaluated in person or the need to arrange testing (such as labs, EKG, etc.), we will make arrangements to do so. Although advances in technology are sophisticated, we cannot ensure that it will always work on either your end or our end. If the connection with a video visit is poor, the visit may have to be switched to a telephone visit. With either a video or telephone visit, we are not always able to ensure that we have a secure connection.  By engaging in this virtual visit, you consent to the provision of healthcare and authorize for your insurance to be billed (if applicable) for the services provided during this visit. Depending on your insurance coverage, you may receive a charge related to this service.  I need to obtain your verbal consent now. Are you willing to proceed with your visit today? [name] has provided verbal consent on 12/19/2023 for a virtual visit (video or telephone). Chiquita CHRISTELLA Barefoot, NP  Date: 12/19/2023 3:04 PM   Virtual Visit via Video Note   I, Chiquita CHRISTELLA Barefoot, connected with  [name]  ([id number], [birth year]) on 12/19/23 at  3:00 PM EDT by a video-enabled telemedicine application and verified that I am speaking with the correct person using two identifiers.  Location: Patient: Virtual Visit Location Patient:  Home Provider: Virtual Visit Location Provider: Home Office   I discussed the limitations of evaluation and management by telemedicine and the availability of in person appointments. The patient expressed understanding and agreed to proceed.    History of Present Illness: [name] is a 68 y.o. who identifies as a female who was assigned female at birth, and is being seen today for covid +  Onset was Saturday started with a sore scratchy throat and coughing- usually dry. And stuffy nose.  Associated symptoms are temp of 100.2 (99 now), sneezing, feel like crap  Modifying factors are tylenol every 6 hours Denies chest pain, shortness of breath Has had covid prior in 2022.   Exposure to sick contacts- husband has been in the hospital for several days  COVID test:    +  Vaccines: 12/08/23- COVID, Flu 12/01/23  Problems:  Patient Active Problem List   Diagnosis Date Noted   Aortic insufficiency    RBBB (right bundle branch block with left anterior fascicular block)    Acute non-recurrent pansinusitis 02/11/2023   Aortic atherosclerosis 05/20/2022   Lung nodules 03/31/2022   Abnormal prominence of clavicle 03/05/2022   COVID-19 virus infection 05/29/2021   Dupuytren contracture 05/07/2021   Preventative health care 11/06/2020   CAD (coronary artery disease), native coronary artery 05/05/2020   Age-related osteoporosis without current pathological fracture 05/05/2020   Hyperlipidemia 05/05/2020   Jaw pain 12/23/2019   Bronchiectasis without complication (HCC)    Atherosclerosis 07/08/2019  Costochondritis 01/24/2019   Chronic cough 01/24/2019   History of cigarette smoking 01/24/2019   RUQ pain 07/29/2016   Epigastric pain 07/29/2016   Osteopenia 08/23/2014   Benign neoplasm of skin 05/07/2010   Abnormal mammogram 04/18/2008   Primary hypertension 03/24/2007   SPONTANEOUS TENSION PNEUMOTHORAX 03/24/2007   FIBROIDS, UTERUS 01/18/2007   MENORRHAGIA, PERIMENOPAUSAL  01/13/2007    Allergies: No Known Allergies Medications:  Current Outpatient Medications:    acetaminophen (TYLENOL) 500 MG tablet, Take 500 mg by mouth every 6 (six) hours as needed for moderate pain or headache., Disp: , Rfl:    albuterol (VENTOLIN HFA) 108 (90 Base) MCG/ACT inhaler, Inhale 1-2 puffs into the lungs every 6 (six) hours as needed for wheezing or shortness of breath., Disp: , Rfl:    aspirin  EC 81 MG tablet, Take 81 mg by mouth daily. Swallow whole., Disp: , Rfl:    atorvastatin  (LIPITOR) 20 MG tablet, TAKE 1 TABLET(20 MG) BY MOUTH DAILY, Disp: 90 tablet, Rfl: 3   azelastine  (ASTELIN ) 0.1 % nasal spray, Place 1 spray into both nostrils 2 (two) times daily., Disp: , Rfl:    denosumab  (PROLIA ) 60 MG/ML SOSY injection, Inject 60 mg into the skin every 6 (six) months. 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  pt has appt on 09/22/23., Disp: 1 mL, Rfl: 0   FIBER PO, Take by mouth daily. Either Benefiber or Metamucil, Disp: , Rfl:    fluticasone  (FLONASE) 50 MCG/ACT nasal spray, Place 1 spray into both nostrils daily., Disp: , Rfl:    gabapentin  (NEURONTIN ) 300 MG capsule, TAKE 1 CAPSULE BY MOUTH EVERY MONDAY, WEDNESDAY AND FRIDAY., Disp: 30 capsule, Rfl: 0   levocetirizine (XYZAL) 5 MG tablet, Take 5 mg by mouth every evening. , Disp: , Rfl:    montelukast  (SINGULAIR ) 10 MG tablet, Take 10 mg by mouth at bedtime., Disp: , Rfl:    nitroGLYCERIN  (NITROSTAT ) 0.4 MG SL tablet, Place 1 tablet (0.4 mg total) under the tongue every 5 (five) minutes as needed for chest pain., Disp: 90 tablet, Rfl: 3   spironolactone  (ALDACTONE ) 25 MG tablet, Take 1 tablet (25 mg total) by mouth once for 1 dose., Disp: 90 tablet, Rfl: 3   UNABLE TO FIND, Take by mouth daily. Med Name: ViActiv, Disp: , Rfl:    valsartan  (DIOVAN ) 160 MG tablet, TAKE 1 TABLET(160 MG) BY MOUTH DAILY, Disp: 90 tablet, Rfl: 3   Vitamin D , Ergocalciferol , (DRISDOL ) 1.25 MG (50000 UNIT) CAPS capsule, TAKE 1 CAPSULE BY MOUTH EVERY 7 DAYS, Disp: 12 capsule, Rfl:  1  Current Facility-Administered Medications:    [START ON 03/20/2024] denosumab  (PROLIA ) injection 60 mg, 60 mg, Subcutaneous, Q6 months, Lowne Chase, Yvonne R, DO   gi cocktail (Maalox,Lidocaine ,Donnatal), 30 mL, Oral, Once, [employer], [place] R, DO  Observations/Objective: Patient is well-developed, well-nourished in no acute distress.  Resting comfortably  at home.  Head is normocephalic, atraumatic.  No labored breathing.  Speech is clear and coherent with logical content.  Patient is alert and oriented at baseline.  Dry cough present and spasm occurred on video   Assessment and Plan:  1. COVID-19 (Primary)  - nirmatrelvir /ritonavir  (PAXLOVID ) 20 x 150 MG & 10 x 100MG  TABS; Take 3 tablets by mouth 2 (two) times daily for 5 days. (Take nirmatrelvir  150 mg two tablets twice daily for 5 days and ritonavir  100 mg one tablet twice daily for 5 days) Patient GFR is >90  Dispense: 30 tablet; Refill: 0  - Continue OTC symptomatic management of choice -  Info for COVID sent on AVS as well - Take prescribed medications as directed - Push fluids - Rest as needed - Discussed return precautions and when to seek in-person evaluation, sent via AVS as well  Reviewed side effects, risks and benefits of medication.    Patient acknowledged agreement and understanding of the plan.   Past Medical, Surgical, Social History, Allergies, and Medications have been Reviewed.    Follow Up Instructions: I discussed the assessment and treatment plan with the patient. The patient was provided an opportunity to ask questions and all were answered. The patient agreed with the plan and demonstrated an understanding of the instructions.  A copy of instructions were sent to the patient via MyChart unless otherwise noted below.    The patient was advised to call back or seek an in-person evaluation if the symptoms worsen or if the condition fails to improve as anticipated.    Chiquita CHRISTELLA Barefoot, NP

## 2023-12-19 NOTE — Patient Instructions (Addendum)
 [name], thank you for joining Chiquita CHRISTELLA Barefoot, NP for today's virtual visit.  While this provider is not your primary care provider (PCP), if your PCP is located in our provider database this encounter information will be shared with them immediately following your visit.   A [facility] MyChart account gives you access to today's visit and all your visits, tests, and labs performed at [facility]  click here if you don't have a [facility] MyChart account or go to [email or web address]  Consent: (Patient) [name] provided verbal consent for this virtual visit at the beginning of the encounter.  Current Medications:  Current Outpatient Medications:    nirmatrelvir /ritonavir  (PAXLOVID ) 20 x 150 MG & 10 x 100MG  TABS, Take 3 tablets by mouth 2 (two) times daily for 5 days. (Take nirmatrelvir  150 mg two tablets twice daily for 5 days and ritonavir  100 mg one tablet twice daily for 5 days) Patient GFR is >90, Disp: 30 tablet, Rfl: 0   acetaminophen (TYLENOL) 500 MG tablet, Take 500 mg by mouth every 6 (six) hours as needed for moderate pain or headache., Disp: , Rfl:    albuterol (VENTOLIN HFA) 108 (90 Base) MCG/ACT inhaler, Inhale 1-2 puffs into the lungs every 6 (six) hours as needed for wheezing or shortness of breath., Disp: , Rfl:    aspirin  EC 81 MG tablet, Take 81 mg by mouth daily. Swallow whole., Disp: , Rfl:    atorvastatin  (LIPITOR) 20 MG tablet, TAKE 1 TABLET(20 MG) BY MOUTH DAILY, Disp: 90 tablet, Rfl: 3   azelastine  (ASTELIN ) 0.1 % nasal spray, Place 1 spray into both nostrils 2 (two) times daily., Disp: , Rfl:    denosumab  (PROLIA ) 60 MG/ML SOSY injection, Inject 60 mg into the skin every 6 (six) months. 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  pt has appt on 09/22/23., Disp: 1 mL, Rfl: 0   FIBER PO, Take by mouth daily. Either Benefiber or Metamucil, Disp: , Rfl:    fluticasone  (FLONASE) 50 MCG/ACT nasal spray, Place 1 spray into both nostrils daily., Disp: , Rfl:    gabapentin   (NEURONTIN ) 300 MG capsule, TAKE 1 CAPSULE BY MOUTH EVERY MONDAY, WEDNESDAY AND FRIDAY., Disp: 30 capsule, Rfl: 0   levocetirizine (XYZAL) 5 MG tablet, Take 5 mg by mouth every evening. , Disp: , Rfl:    montelukast  (SINGULAIR ) 10 MG tablet, Take 10 mg by mouth at bedtime., Disp: , Rfl:    nitroGLYCERIN  (NITROSTAT ) 0.4 MG SL tablet, Place 1 tablet (0.4 mg total) under the tongue every 5 (five) minutes as needed for chest pain., Disp: 90 tablet, Rfl: 3   spironolactone  (ALDACTONE ) 25 MG tablet, Take 1 tablet (25 mg total) by mouth once for 1 dose., Disp: 90 tablet, Rfl: 3   UNABLE TO FIND, Take by mouth daily. Med Name: ViActiv, Disp: , Rfl:    valsartan  (DIOVAN ) 160 MG tablet, TAKE 1 TABLET(160 MG) BY MOUTH DAILY, Disp: 90 tablet, Rfl: 3   Vitamin D , Ergocalciferol , (DRISDOL ) 1.25 MG (50000 UNIT) CAPS capsule, TAKE 1 CAPSULE BY MOUTH EVERY 7 DAYS, Disp: 12 capsule, Rfl: 1  Current Facility-Administered Medications:    [START ON 03/20/2024] denosumab  (PROLIA ) injection 60 mg, 60 mg, Subcutaneous, Q6 months, Lowne Chase, Yvonne R, DO   Medications ordered in this encounter:  Meds ordered this encounter  Medications   nirmatrelvir /ritonavir  (PAXLOVID ) 20 x 150 MG & 10 x 100MG  TABS    Sig: Take 3 tablets by mouth 2 (two) times daily for 5 days. (Take  nirmatrelvir  150 mg two tablets twice daily for 5 days and ritonavir  100 mg one tablet twice daily for 5 days) Patient GFR is >90    Dispense:  30 tablet    Refill:  0    Supervising Provider:   BLAISE ALEENE KIDD [phone]     *If you need refills on other medications prior to your next appointment, please contact your pharmacy*  Follow-Up: Call back or seek an in-person evaluation if the symptoms worsen or if the condition fails to improve as anticipated.  [facility] [phone]  Care Instructions:  - Continue OTC symptomatic management of choice - Info for COVID sent on AVS as well - Take prescribed medications as  directed - Push fluids - Rest as needed   Isolation Instructions: You are to isolate at home until you have been fever free for at least 24 hours without a fever-reducing medication, and symptoms have been steadily improving for 24 hours. At that time,  you can end isolation but need to mask for an additional 5 days.   If you must be around other household members who do not have symptoms, you need to make sure that both you and the family members are masking consistently with a high-quality mask.  If you note any worsening of symptoms despite treatment, please seek an in-person evaluation ASAP. If you note any significant shortness of breath or any chest pain, please seek ER evaluation. Please do not delay care!   COVID-19: What to Do if You Are Sick If you test positive and are an older adult or someone who is at high risk of getting very sick from COVID-19, treatment may be available. Contact a healthcare provider right away after a positive test to determine if you are eligible, even if your symptoms are mild right now. You can also visit a Test to Treat location and, if eligible, receive a prescription from a provider. Don't delay: Treatment must be started within the first few days to be effective. If you have a fever, cough, or other symptoms, you might have COVID-19. Most people have mild illness and are able to recover at home. If you are sick: Keep track of your symptoms. If you have an emergency warning sign (including trouble breathing), call 911. Steps to help prevent the spread of COVID-19 if you are sick If you are sick with COVID-19 or think you might have COVID-19, follow the steps below to care for yourself and to help protect other people in your home and community. Stay home except to get medical care Stay home. Most people with COVID-19 have mild illness and can recover at home without medical care. Do not leave your home, except to get medical care. Do not visit public  areas and do not go to places where you are unable to wear a mask. Take care of yourself. Get rest and stay hydrated. Take over-the-counter medicines, such as acetaminophen, to help you feel better. Stay in touch with your doctor. Call before you get medical care. Be sure to get care if you have trouble breathing, or have any other emergency warning signs, or if you think it is an emergency. Avoid public transportation, ride-sharing, or taxis if possible. Get tested If you have symptoms of COVID-19, get tested. While waiting for test results, stay away from others, including staying apart from those living in your household. Get tested as soon as possible after your symptoms start. Treatments may be available for people with COVID-19  who are at risk for becoming very sick. Don't delay: Treatment must be started early to be effective--some treatments must begin within 5 days of your first symptoms. Contact your healthcare provider right away if your test result is positive to determine if you are eligible. Self-tests are one of several options for testing for the virus that causes COVID-19 and may be more convenient than laboratory-based tests and point-of-care tests. Ask your healthcare provider or your local health department if you need help interpreting your test results. You can visit your state, tribal, local, and territorial health department's website to look for the latest local information on testing sites. Separate yourself from other people As much as possible, stay in a specific room and away from other people and pets in your home. If possible, you should use a separate bathroom. If you need to be around other people or animals in or outside of the home, wear a well-fitting mask. Tell your close contacts that they may have been exposed to COVID-19. An infected person can spread COVID-19 starting 48 hours (or 2 days) before the person has any symptoms or tests positive. By letting your close  contacts know they may have been exposed to COVID-19, you are helping to protect everyone. See COVID-19 and Animals if you have questions about pets. If you are diagnosed with COVID-19, someone from the health department may call you. Answer the call to slow the spread. Monitor your symptoms Symptoms of COVID-19 include fever, cough, or other symptoms. Follow care instructions from your healthcare provider and local health department. Your local health authorities may give instructions on checking your symptoms and reporting information. When to seek emergency medical attention Look for emergency warning signs* for COVID-19. If someone is showing any of these signs, seek emergency medical care immediately: Trouble breathing Persistent pain or pressure in the chest New confusion Inability to wake or stay awake Pale, gray, or blue-colored skin, lips, or nail beds, depending on skin tone *This list is not all possible symptoms. Please call your medical provider for any other symptoms that are severe or concerning to you. Call 911 or call ahead to your local emergency facility: Notify the operator that you are seeking care for someone who has or may have COVID-19. Call ahead before visiting your doctor Call ahead. Many medical visits for routine care are being postponed or done by phone or telemedicine. If you have a medical appointment that cannot be postponed, call your doctor's office, and tell them you have or may have COVID-19. This will help the office protect themselves and other patients. If you are sick, wear a well-fitting mask You should wear a mask if you must be around other people or animals, including pets (even at home). Wear a mask with the best fit, protection, and comfort for you. You don't need to wear the mask if you are alone. If you can't put on a mask (because of trouble breathing, for example), cover your coughs and sneezes in some other way. Try to stay at least 6 feet away  from other people. This will help protect the people around you. Masks should not be placed on young children under age 16 years, anyone who has trouble breathing, or anyone who is not able to remove the mask without help. Cover your coughs and sneezes Cover your mouth and nose with a tissue when you cough or sneeze. Throw away used tissues in a lined trash can. Immediately wash your hands with soap and water  for at least 20 seconds. If soap and water are not available, clean your hands with an alcohol-based hand sanitizer that contains at least 60% alcohol. Clean your hands often Wash your hands often with soap and water for at least 20 seconds. This is especially important after blowing your nose, coughing, or sneezing; going to the bathroom; and before eating or preparing food. Use hand sanitizer if soap and water are not available. Use an alcohol-based hand sanitizer with at least 60% alcohol, covering all surfaces of your hands and rubbing them together until they feel dry. Soap and water are the best option, especially if hands are visibly dirty. Avoid touching your eyes, nose, and mouth with unwashed hands. Handwashing Tips Avoid sharing personal household items Do not share dishes, drinking glasses, cups, eating utensils, towels, or bedding with other people in your home. Wash these items thoroughly after using them with soap and water or put in the dishwasher. Clean surfaces in your home regularly Clean and disinfect high-touch surfaces (for example, doorknobs, tables, handles, light switches, and countertops) in your sick room and bathroom. In shared spaces, you should clean and disinfect surfaces and items after each use by the person who is ill. If you are sick and cannot clean, a caregiver or other person should only clean and disinfect the area around you (such as your bedroom and bathroom) on an as needed basis. Your caregiver/other person should wait as long as possible (at least  several hours) and wear a mask before entering, cleaning, and disinfecting shared spaces that you use. Clean and disinfect areas that may have blood, stool, or body fluids on them. Use household cleaners and disinfectants. Clean visible dirty surfaces with household cleaners containing soap or detergent. Then, use a household disinfectant. Use a product from [employer] List N: Disinfectants for Coronavirus (COVID-19). Be sure to follow the instructions on the label to ensure safe and effective use of the product. Many products recommend keeping the surface wet with a disinfectant for a certain period of time (look at contact time on the product label). You may also need to wear personal protective equipment, such as gloves, depending on the directions on the product label. Immediately after disinfecting, wash your hands with soap and water for 20 seconds. For completed guidance on cleaning and disinfecting your home, visit Complete Disinfection Guidance. Take steps to improve ventilation at home Improve ventilation (air flow) at home to help prevent from spreading COVID-19 to other people in your household. Clear out COVID-19 virus particles in the air by opening windows, using air filters, and turning on fans in your home. Use this interactive tool to learn how to improve air flow in your home. When you can be around others after being sick with COVID-19 Deciding when you can be around others is different for different situations. Find out when you can safely end home isolation. For any additional questions about your care, contact your healthcare provider or state or local health department. 05/13/2020 Content source: [facility] ([facility]), Division of Viral Diseases This information is not intended to replace advice given to you by your health care provider. Make sure you discuss any questions you have with your health care provider. Document Revised:  06/26/2020 Document Reviewed: 06/26/2020 Elsevier Patient Education  2022 [employer].     If you have been instructed to have an in-person evaluation today at a local Urgent Care facility, please use the link below. It will take you to  a list of all of our available [facility], including address, phone number and hours of operation. Please do not delay care.  [facility]  If you or a family member do not have a primary care provider, use the link below to schedule a visit and establish care. When you choose a [facility] primary care physician or advanced practice provider, you gain a long-term partner in health. Find a Primary Care Provider  Learn more about [facility]'s in-office and virtual care options:  - Get Care Now

## 2024-01-06 ENCOUNTER — Ambulatory Visit: Payer: Medicare PPO | Admitting: *Deleted

## 2024-01-06 ENCOUNTER — Telehealth: Payer: Self-pay | Admitting: *Deleted

## 2024-01-06 VITALS — Ht 62.5 in | Wt 126.0 lb

## 2024-01-06 DIAGNOSIS — Z0000 Encounter for general adult medical examination without abnormal findings: Secondary | ICD-10-CM

## 2024-01-06 NOTE — Patient Instructions (Addendum)
 [name],  Thank you for taking the time for your Medicare Wellness Visit. I appreciate your continued commitment to your health goals. Please review the care plan we discussed, and feel free to reach out if I can assist you further.  Please note that Annual Wellness Visits do not include a physical exam. Some assessments may be limited, especially if the visit was conducted virtually. If needed, we may recommend an in-person follow-up with your provider.  Goals: Eat healthy, drink plenty of water & continue exercising  Ongoing Care Seeing your primary care provider every 3 to 6 months helps us  monitor your health and provide consistent, personalized care.   Dr Antonio Meth: 05/31/24 8:20am Annual Wellness Visit: 01/09/25 10:20am  Recommended Screenings:  Health Maintenance  Topic Date Due   Medicare Annual Wellness Visit  01/05/2024   COVID-19 Vaccine (8 - Pfizer risk 2025-26 season) 06/07/2024   Breast Cancer Screening  08/07/2024   DEXA scan (bone density measurement)  08/09/2025   Colon Cancer Screening  10/27/2027   DTaP/Tdap/Td vaccine (5 - Td or Tdap) 02/02/2029   Pneumococcal Vaccine for age over 40  Completed   Flu Shot  Completed   Hepatitis C Screening  Completed   Zoster (Shingles) Vaccine  Completed   Meningitis B Vaccine  Aged Out       01/02/2024    6:56 PM  Advanced Directives  Does Patient Have a Medical Advance Directive? Yes  Type of Advance Directive Healthcare Power of Attorney  Does patient want to make changes to medical advance directive? No - Patient declined  Copy of Healthcare Power of Attorney in Chart? No - copy requested  Once completed and notarized, you may return a copy of your Advanced Directive(s) by either of the following:  Bring a copy of your health care power of attorney and living will to the office to be added to your chart at your convenience. You can mail a copy to [facility] [address] [place], [place] [address]  or email to [email or web address]   Vision: Annual vision screenings are recommended for early detection of glaucoma, cataracts, and diabetic retinopathy. These exams can also reveal signs of chronic conditions such as diabetes and high blood pressure.  Dental: Annual dental screenings help detect early signs of oral cancer, gum disease, and other conditions linked to overall health, including heart disease and diabetes.  Please see the attached documents for additional preventive care recommendations.

## 2024-01-06 NOTE — Telephone Encounter (Signed)
 FYI:  pt had awv today and reports that her spouse is in the hospital after a recent fall. He is in the rehab portion but hasn't been able to do rehab because his blood counts are so low. She states his expected discharge is next Friday and if there has been no change by then they are looking at sending him home with Hospice care.

## 2024-01-06 NOTE — Progress Notes (Signed)
 Chief Complaint  Patient presents with   Medicare Wellness     Subjective:   [name] is a 68 y.o. female who presents for a Medicare Annual Wellness Visit.  Allergies (verified) Patient has no known allergies.   History: Past Medical History:  Diagnosis Date   Allergy    Aortic insufficiency    mild by echo 10/2023   CAD (coronary artery disease), native coronary artery    Coronary CTA showed a coronary calcium  score of 134 with <25% mid LAD/proximal to mid RCA/ostial LCx.   Cataract    Chronic cough 01/24/2019   Costochondritis 01/24/2019   Emphysema of lung (HCC) 2020   Mild emphysema   Epigastric pain 07/29/2016   FIBROIDS, UTERUS 01/18/2007   Qualifier: Diagnosis of  By: Antonio ROSALEA Rockers     H/O Bell's palsy 05/2010   HYPERTENSION 03/24/2007   Qualifier: Diagnosis of  By: Joshua CMA, Chemira     MENORRHAGIA, PERIMENOPAUSAL 01/13/2007   Qualifier: Diagnosis of  By: Antonio ROSALEA Rockers     MOLE 05/07/2010   Qualifier: Diagnosis of  By: Antonio ROSALEA Rockers     Osteopenia 08/23/2014   Osteoporosis 07/2019   RBBB (right bundle branch block with left anterior fascicular block)    RUQ pain 07/29/2016   Spontaneous pneumothorax 2005   R, s/p tube, then VATS   Spontaneous tension pneumothorax 03/24/2007   Qualifier: Diagnosis of  By: Antonio ROSALEA Rockers     Past Surgical History:  Procedure Laterality Date   BRONCHIAL WASHINGS  08/06/2019   Procedure: BRONCHIAL lavage;  Surgeon: Geronimo Amel, MD;  Location: [facility];  Service: Endoscopy;;   CATARACT EXTRACTION W/ INTRAOCULAR LENS IMPLANT Bilateral    june 2023   CESAREAN SECTION     CHEST TUBE INSERTION  2005   spontaneus Pneumothorax   COLONOSCOPY     EYE SURGERY  07/2020   Cararacts   EYE SURGERY Bilateral    yag-- Dr Milan   SPIROMETRY  01/20/2015   Dr. Frutoso   VIDEO ASSISTED THORACOSCOPY  2005   VIDEO BRONCHOSCOPY N/A 08/06/2019   Procedure: VIDEO BRONCHOSCOPY WITHOUT FLUORO;  Surgeon: Geronimo Amel, MD;  Location: [facility];  Service: Endoscopy;  Laterality: N/A;   WISDOM TOOTH EXTRACTION     YAG LASER APPLICATION Bilateral 11/2023   Family History  Problem Relation Age of Onset   Hypertension Mother    Stroke Mother    Heart disease Father    Hypertension Father    Arthritis Father    Cancer Maternal Grandmother 18       breast   Diabetes Maternal Grandmother    COPD Maternal Grandfather    Colon cancer Neg Hx    Esophageal cancer Neg Hx    Rectal cancer Neg Hx    Stomach cancer Neg Hx    Social History   Occupational History   Occupation: southern Corporate Investment Banker: [employer]    Comment: retired  Tobacco Use   Smoking status: Former    Current packs/day: 0.00    Average packs/day: 1 pack/day for 14.0 years (14.0 ttl pk-yrs)    Types: Cigarettes    Start date: 07/10/1968    Quit date: 07/11/1982    Years since quitting: 41.5   Smokeless tobacco: Never  Vaping Use   Vaping status: Never Used  Substance and Sexual Activity   Alcohol use: Yes    Alcohol/week: 0.0 standard drinks of alcohol  Comment: rare   Drug use: No   Sexual activity: Yes    Partners: Male    Birth control/protection: Post-menopausal    Comment: 1st intercourse 68 yo-Fewer than 5 partners   Tobacco Counseling Counseling given: Not Answered  SDOH Screenings   Food Insecurity: No Food Insecurity (01/02/2024)  Housing: Low Risk  (01/02/2024)  Transportation Needs: No Transportation Needs (01/02/2024)  Utilities: Not At Risk (01/06/2024)  Alcohol Screen: Low Risk  (01/02/2024)  Depression (PHQ2-9): Low Risk  (01/06/2024)  Financial Resource Strain: Low Risk  (01/02/2024)  Physical Activity: Sufficiently Active (01/02/2024)  Social Connections: Moderately Integrated (01/02/2024)  Stress: Stress Concern Present (01/02/2024)  Tobacco Use: Medium Risk (01/06/2024)  Health Literacy: Adequate Health Literacy (01/05/2023)   See flowsheets for full  screening details  Depression Screen PHQ 2 & 9 Depression Scale- Over the past 2 weeks, how often have you been bothered by any of the following problems? Little interest or pleasure in doing things: 0 Feeling down, depressed, or hopeless (PHQ Adolescent also includes...irritable): 0 PHQ-2 Total Score: 0 Trouble falling or staying asleep, or sleeping too much: 1 (takes trazadone and helps) Feeling tired or having little energy: 0 Poor appetite or overeating (PHQ Adolescent also includes...weight loss): 0 Feeling bad about yourself - or that you are a failure or have let yourself or your family down: 0 Trouble concentrating on things, such as reading the newspaper or watching television (PHQ Adolescent also includes...like school work): 0 Moving or speaking so slowly that other people could have noticed. Or the opposite - being so fidgety or restless that you have been moving around a lot more than usual: 0 Thoughts that you would be better off dead, or of hurting yourself in some way: 0 PHQ-9 Total Score: 1 If you checked off any problems, how difficult have these problems made it for you to do your work, take care of things at home, or get along with other people?: Not difficult at all  Depression Treatment Depression Interventions/Treatment : EYV7-0 Score <4 Follow-up Not Indicated     Goals Addressed             This Visit's Progress    Patient Stated   On track    Eat healthy, drink plenty of water & continue exercising       Visit info / Clinical Intake: Medicare Wellness Visit Type:: Subsequent Annual Wellness Visit Persons participating in visit:: patient Medicare Wellness Visit Mode:: Telephone If telephone:: video declined Because this visit was a virtual/telehealth visit:: pt reported vitals If Telephone or Video please confirm:: I connected with the patient using audio enabled telemedicine application and verified that I am speaking with the correct person using two  identifiers; I discussed the limitations of evaluation and management by telemedicine; The patient expressed understanding and agreed to proceed Patient Location:: hospital with spouse Provider Location:: office Information given by:: patient Interpreter Needed?: No Pre-visit prep was completed: yes AWV questionnaire completed by patient prior to visit?: yes Date:: 01/02/24 Living arrangements:: lives with spouse/significant other Patient's Overall Health Status Rating: good Typical amount of pain: some (occasional hip pain with increased walking) Does pain affect daily life?: no Are you currently prescribed opioids?: no  Dietary Habits and Nutritional Risks How many meals a day?: 3 Eats fruit and vegetables daily?: yes Most meals are obtained by: preparing own meals In the last 2 weeks, have you had any of the following?: none Diabetic:: no  Functional Status Activities of Daily Living (to include  ambulation/medication): (Patient-Rptd) Independent Ambulation: (Patient-Rptd) Independent Medication Administration: Independent Home Management: (Patient-Rptd) Independent Manage your own finances?: yes Primary transportation is: driving Concerns about vision?: no *vision screening is required for [facility]* (up to date with Dr Raelyn) Concerns about hearing?: no  Fall Screening Falls in the past year?: (Patient-Rptd) 0 Number of falls in past year: 0 Was there an injury with Fall?: 0 Fall Risk Category Calculator: 0 Patient Fall Risk Level: Low Fall Risk  Fall Risk Patient at Risk for Falls Due to: No Fall Risks Fall risk Follow up: Falls evaluation completed  Home and Transportation Safety: All rugs have non-skid backing?: yes All stairs or steps have railings?: (!) no (the front entrance steps do not have a handrail and they do not use that entrance.) Grab bars in the bathtub or shower?: yes Have non-skid surface in bathtub or shower?: yes Good home lighting?: yes Regular seat  belt use?: yes Hospital stays in the last year:: no  Cognitive Assessment Difficulty concentrating, remembering, or making decisions? : no Will 6CIT or Mini Cog be Completed: yes What year is it?: 0 points What month is it?: 0 points Give patient an address phrase to remember (5 components): [address], [place]  About what time is it?: 0 points Count backwards from 20 to 1: 0 points Say the months of the year in reverse: 0 points Repeat the address phrase from earlier: 0 points 6 CIT Score: 0 points  Advance Directives (For Healthcare) Does Patient Have a Medical Advance Directive?: Yes Does patient want to make changes to medical advance directive?: No - Patient declined Type of Advance Directive: Healthcare Power of Attorney Copy of Healthcare Power of Attorney in Chart?: No - copy requested  Reviewed/Updated  Reviewed/Updated: Reviewed All (Medical, Surgical, Family, Medications, Allergies, Care Teams, Patient Goals)        Objective:    Today's Vitals   01/06/24 1350  Weight: 126 lb (57.2 kg)  Height: 5' 2.5 (1.588 m)   Body mass index is 22.68 kg/m.  Current Medications (verified) Outpatient Encounter Medications as of 01/06/2024  Medication Sig   acetaminophen (TYLENOL) 500 MG tablet Take 500 mg by mouth every 6 (six) hours as needed for moderate pain or headache.   albuterol (VENTOLIN HFA) 108 (90 Base) MCG/ACT inhaler Inhale 1-2 puffs into the lungs every 6 (six) hours as needed for wheezing or shortness of breath.   aspirin  EC 81 MG tablet Take 81 mg by mouth daily. Swallow whole.   atorvastatin  (LIPITOR) 20 MG tablet TAKE 1 TABLET(20 MG) BY MOUTH DAILY   azelastine  (ASTELIN ) 0.1 % nasal spray Place 1 spray into both nostrils 2 (two) times daily.   denosumab  (PROLIA ) 60 MG/ML SOSY injection Inject 60 mg into the skin every 6 (six) months. 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  pt has appt on 09/22/23.   FIBER PO Take by mouth daily. Either Benefiber or Metamucil    fluticasone  (FLONASE) 50 MCG/ACT nasal spray Place 1 spray into both nostrils daily.   gabapentin  (NEURONTIN ) 300 MG capsule TAKE 1 CAPSULE BY MOUTH EVERY MONDAY, WEDNESDAY AND FRIDAY.   levocetirizine (XYZAL) 5 MG tablet Take 5 mg by mouth every evening.    montelukast  (SINGULAIR ) 10 MG tablet Take 10 mg by mouth at bedtime.   nitroGLYCERIN  (NITROSTAT ) 0.4 MG SL tablet Place 1 tablet (0.4 mg total) under the tongue every 5 (five) minutes as needed for chest pain.   spironolactone  (ALDACTONE ) 25 MG tablet Take 1 tablet (25 mg total) by  mouth once for 1 dose.   UNABLE TO FIND Take by mouth daily. Med Name: Wenda   valsartan  (DIOVAN ) 160 MG tablet TAKE 1 TABLET(160 MG) BY MOUTH DAILY   Vitamin D , Ergocalciferol , (DRISDOL ) 1.25 MG (50000 UNIT) CAPS capsule TAKE 1 CAPSULE BY MOUTH EVERY 7 DAYS   Facility-Administered Encounter Medications as of 01/06/2024  Medication   [START ON 03/20/2024] denosumab  (PROLIA ) injection 60 mg   Hearing/Vision screen No results found. Immunizations and Health Maintenance Health Maintenance  Topic Date Due   COVID-19 Vaccine (8 - Pfizer risk 2025-26 season) 06/07/2024   Mammogram  08/07/2024   Medicare Annual Wellness (AWV)  01/05/2025   DEXA SCAN  08/09/2025   Colonoscopy  10/27/2027   DTaP/Tdap/Td (5 - Td or Tdap) 02/02/2029   Pneumococcal Vaccine: 50+ Years  Completed   Influenza Vaccine  Completed   Hepatitis C Screening  Completed   Zoster Vaccines- Shingrix  Completed   Meningococcal B Vaccine  Aged Out        Assessment/Plan:  This is a routine wellness examination for [name].  Patient Care Team: Antonio Meth, Jamee SAUNDERS, DO as PCP - General Shlomo Wilbert SAUNDERS, MD as PCP - Cardiology (Cardiology) Frutoso Luz, MD as Referring Physician (Allergy) Geronimo Amel, MD as Consulting Physician (Pulmonary Disease) Robinson Pao, MD as Consulting Physician (Dermatology) Raelyn [facility])  I have personally reviewed and noted the following in  the patient's chart:   Medical and social history Use of alcohol, tobacco or illicit drugs  Current medications and supplements including opioid prescriptions. Functional ability and status Nutritional status Physical activity Advanced directives List of other physicians Hospitalizations, surgeries, and ER visits in previous 12 months Vitals Screenings to include cognitive, depression, and falls Referrals and appointments  No orders of the defined types were placed in this encounter.  In addition, I have reviewed and discussed with patient certain preventive protocols, quality metrics, and best practice recommendations. A written personalized care plan for preventive services as well as general preventive health recommendations were provided to patient.   Lolita Libra, CMA   01/06/2024   Return in 1 year (on 01/05/2025).  After Visit Summary: (MyChart) Due to this being a telephonic visit, the after visit summary with patients personalized plan was offered to patient via MyChart   Nurse Notes: see phone note

## 2024-01-10 NOTE — Progress Notes (Signed)
 Chief Complaint  Patient presents with   Medicare Wellness     Subjective:   [name] is a 68 y.o. female who presents for a Medicare Annual Wellness Visit.  Allergies (verified) Patient has no known allergies.   History: Past Medical History:  Diagnosis Date   Allergy    Aortic insufficiency    mild by echo 10/2023   CAD (coronary artery disease), native coronary artery    Coronary CTA showed a coronary calcium  score of 134 with <25% mid LAD/proximal to mid RCA/ostial LCx.   Cataract    Chronic cough 01/24/2019   Costochondritis 01/24/2019   Emphysema of lung (HCC) 2020   Mild emphysema   Epigastric pain 07/29/2016   FIBROIDS, UTERUS 01/18/2007   Qualifier: Diagnosis of  By: Antonio ROSALEA Rockers     H/O Bell's palsy 05/2010   HYPERTENSION 03/24/2007   Qualifier: Diagnosis of  By: Joshua CMA, Chemira     MENORRHAGIA, PERIMENOPAUSAL 01/13/2007   Qualifier: Diagnosis of  By: Antonio ROSALEA Rockers     MOLE 05/07/2010   Qualifier: Diagnosis of  By: Antonio ROSALEA Rockers     Osteopenia 08/23/2014   Osteoporosis 07/2019   RBBB (right bundle branch block with left anterior fascicular block)    RUQ pain 07/29/2016   Spontaneous pneumothorax 2005   R, s/p tube, then VATS   Spontaneous tension pneumothorax 03/24/2007   Qualifier: Diagnosis of  By: Antonio ROSALEA Rockers     Past Surgical History:  Procedure Laterality Date   BRONCHIAL WASHINGS  08/06/2019   Procedure: BRONCHIAL lavage;  Surgeon: Geronimo Amel, MD;  Location: [facility];  Service: Endoscopy;;   CATARACT EXTRACTION W/ INTRAOCULAR LENS IMPLANT Bilateral    june 2023   CESAREAN SECTION     CHEST TUBE INSERTION  2005   spontaneus Pneumothorax   COLONOSCOPY     EYE SURGERY  07/2020   Cararacts   EYE SURGERY Bilateral    yag-- Dr Milan   SPIROMETRY  01/20/2015   Dr. Frutoso   VIDEO ASSISTED THORACOSCOPY  2005   VIDEO BRONCHOSCOPY N/A 08/06/2019   Procedure: VIDEO BRONCHOSCOPY WITHOUT FLUORO;  Surgeon: Geronimo Amel, MD;  Location: [facility];  Service: Endoscopy;  Laterality: N/A;   WISDOM TOOTH EXTRACTION     YAG LASER APPLICATION Bilateral 11/2023   Family History  Problem Relation Age of Onset   Hypertension Mother    Stroke Mother    Heart disease Father    Hypertension Father    Arthritis Father    Cancer Maternal Grandmother 92       breast   Diabetes Maternal Grandmother    COPD Maternal Grandfather    Colon cancer Neg Hx    Esophageal cancer Neg Hx    Rectal cancer Neg Hx    Stomach cancer Neg Hx    Social History   Occupational History   Occupation: southern Corporate Investment Banker: [employer]    Comment: retired  Tobacco Use   Smoking status: Former    Current packs/day: 0.00    Average packs/day: 1 pack/day for 14.0 years (14.0 ttl pk-yrs)    Types: Cigarettes    Start date: 07/10/1968    Quit date: 07/11/1982    Years since quitting: 41.5   Smokeless tobacco: Never  Vaping Use   Vaping status: Never Used  Substance and Sexual Activity   Alcohol use: Yes    Alcohol/week: 0.0 standard drinks of alcohol  Comment: rare   Drug use: No   Sexual activity: Yes    Partners: Male    Birth control/protection: Post-menopausal    Comment: 1st intercourse 67 yo-Fewer than 5 partners   Tobacco Counseling Counseling given: Not Answered  SDOH Screenings   Food Insecurity: No Food Insecurity (01/02/2024)  Housing: Low Risk  (01/02/2024)  Transportation Needs: No Transportation Needs (01/02/2024)  Utilities: Not At Risk (01/06/2024)  Alcohol Screen: Low Risk  (01/02/2024)  Depression (PHQ2-9): Low Risk  (01/06/2024)  Financial Resource Strain: Low Risk  (01/02/2024)  Physical Activity: Sufficiently Active (01/02/2024)  Social Connections: Moderately Integrated (01/02/2024)  Stress: Stress Concern Present (01/02/2024)  Tobacco Use: Medium Risk (01/06/2024)  Health Literacy: Adequate Health Literacy (01/05/2023)   See flowsheets for full  screening details  Depression Screen PHQ 2 & 9 Depression Scale- Over the past 2 weeks, how often have you been bothered by any of the following problems? Little interest or pleasure in doing things: 0 Feeling down, depressed, or hopeless (PHQ Adolescent also includes...irritable): 0 PHQ-2 Total Score: 0 Trouble falling or staying asleep, or sleeping too much: 1 (takes trazadone and helps) Feeling tired or having little energy: 0 Poor appetite or overeating (PHQ Adolescent also includes...weight loss): 0 Feeling bad about yourself - or that you are a failure or have let yourself or your family down: 0 Trouble concentrating on things, such as reading the newspaper or watching television (PHQ Adolescent also includes...like school work): 0 Moving or speaking so slowly that other people could have noticed. Or the opposite - being so fidgety or restless that you have been moving around a lot more than usual: 0 Thoughts that you would be better off dead, or of hurting yourself in some way: 0 PHQ-9 Total Score: 1 If you checked off any problems, how difficult have these problems made it for you to do your work, take care of things at home, or get along with other people?: Not difficult at all  Depression Treatment Depression Interventions/Treatment : EYV7-0 Score <4 Follow-up Not Indicated     Goals Addressed             This Visit's Progress    Patient Stated   On track    Eat healthy, drink plenty of water & continue exercising       Visit info / Clinical Intake: Medicare Wellness Visit Type:: Initial Annual Wellness Visit Persons participating in visit:: patient Medicare Wellness Visit Mode:: Telephone If telephone:: video declined Because this visit was a virtual/telehealth visit:: pt reported vitals If Telephone or Video please confirm:: I connected with the patient using audio enabled telemedicine application and verified that I am speaking with the correct person using two  identifiers; I discussed the limitations of evaluation and management by telemedicine; The patient expressed understanding and agreed to proceed Patient Location:: hospital with spouse Provider Location:: office Information given by:: patient Interpreter Needed?: No Pre-visit prep was completed: yes AWV questionnaire completed by patient prior to visit?: yes Date:: 01/02/24 Living arrangements:: lives with spouse/significant other Patient's Overall Health Status Rating: good Typical amount of pain: some (occasional hip pain with increased walking) Does pain affect daily life?: no Are you currently prescribed opioids?: no  Dietary Habits and Nutritional Risks How many meals a day?: 3 Eats fruit and vegetables daily?: yes Most meals are obtained by: preparing own meals In the last 2 weeks, have you had any of the following?: none Diabetic:: no  Functional Status Activities of Daily Living (to include  ambulation/medication): (Patient-Rptd) Independent Ambulation: (Patient-Rptd) Independent Medication Administration: Independent Home Management: (Patient-Rptd) Independent Manage your own finances?: yes Primary transportation is: driving Concerns about vision?: no *vision screening is required for [facility]* (up to date with Dr Raelyn) Concerns about hearing?: no  Fall Screening Falls in the past year?: (Patient-Rptd) 0 Number of falls in past year: 0 Was there an injury with Fall?: 0 Fall Risk Category Calculator: 0 Patient Fall Risk Level: Low Fall Risk  Fall Risk Patient at Risk for Falls Due to: No Fall Risks Fall risk Follow up: Falls evaluation completed  Home and Transportation Safety: All rugs have non-skid backing?: yes All stairs or steps have railings?: (!) no (the front entrance steps do not have a handrail and they do not use that entrance.) Grab bars in the bathtub or shower?: yes Have non-skid surface in bathtub or shower?: yes Good home lighting?: yes Regular seat  belt use?: yes Hospital stays in the last year:: no  Cognitive Assessment Difficulty concentrating, remembering, or making decisions? : no Will 6CIT or Mini Cog be Completed: yes What year is it?: 0 points What month is it?: 0 points Give patient an address phrase to remember (5 components): [address], [place]  About what time is it?: 0 points Count backwards from 20 to 1: 0 points Say the months of the year in reverse: 0 points Repeat the address phrase from earlier: 0 points 6 CIT Score: 0 points  Advance Directives (For Healthcare) Does Patient Have a Medical Advance Directive?: Yes Does patient want to make changes to medical advance directive?: No - Patient declined Type of Advance Directive: Healthcare Power of Attorney Copy of Healthcare Power of Attorney in Chart?: No - copy requested  Reviewed/Updated  Reviewed/Updated: Reviewed All (Medical, Surgical, Family, Medications, Allergies, Care Teams, Patient Goals)        Objective:    Today's Vitals   01/06/24 1350  Weight: 126 lb (57.2 kg)  Height: 5' 2.5 (1.588 m)   Body mass index is 22.68 kg/m.  Current Medications (verified) Outpatient Encounter Medications as of 01/06/2024  Medication Sig   acetaminophen (TYLENOL) 500 MG tablet Take 500 mg by mouth every 6 (six) hours as needed for moderate pain or headache.   albuterol (VENTOLIN HFA) 108 (90 Base) MCG/ACT inhaler Inhale 1-2 puffs into the lungs every 6 (six) hours as needed for wheezing or shortness of breath.   aspirin  EC 81 MG tablet Take 81 mg by mouth daily. Swallow whole.   atorvastatin  (LIPITOR) 20 MG tablet TAKE 1 TABLET(20 MG) BY MOUTH DAILY   azelastine  (ASTELIN ) 0.1 % nasal spray Place 1 spray into both nostrils 2 (two) times daily.   denosumab  (PROLIA ) 60 MG/ML SOSY injection Inject 60 mg into the skin every 6 (six) months. 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  pt has appt on 09/22/23.   FIBER PO Take by mouth daily. Either Benefiber or Metamucil    fluticasone  (FLONASE) 50 MCG/ACT nasal spray Place 1 spray into both nostrils daily.   gabapentin  (NEURONTIN ) 300 MG capsule TAKE 1 CAPSULE BY MOUTH EVERY MONDAY, WEDNESDAY AND FRIDAY.   levocetirizine (XYZAL) 5 MG tablet Take 5 mg by mouth every evening.    montelukast  (SINGULAIR ) 10 MG tablet Take 10 mg by mouth at bedtime.   nitroGLYCERIN  (NITROSTAT ) 0.4 MG SL tablet Place 1 tablet (0.4 mg total) under the tongue every 5 (five) minutes as needed for chest pain.   spironolactone  (ALDACTONE ) 25 MG tablet Take 1 tablet (25 mg total) by  mouth once for 1 dose.   UNABLE TO FIND Take by mouth daily. Med Name: ViActiv   valsartan  (DIOVAN ) 160 MG tablet TAKE 1 TABLET(160 MG) BY MOUTH DAILY   Vitamin D , Ergocalciferol , (DRISDOL ) 1.25 MG (50000 UNIT) CAPS capsule TAKE 1 CAPSULE BY MOUTH EVERY 7 DAYS   Facility-Administered Encounter Medications as of 01/06/2024  Medication   [START ON 03/20/2024] denosumab  (PROLIA ) injection 60 mg   Hearing/Vision screen No results found. Immunizations and Health Maintenance Health Maintenance  Topic Date Due   COVID-19 Vaccine (8 - Pfizer risk 2025-26 season) 06/07/2024   Mammogram  08/07/2024   Medicare Annual Wellness (AWV)  01/05/2025   DEXA SCAN  08/09/2025   Colonoscopy  10/27/2027   DTaP/Tdap/Td (5 - Td or Tdap) 02/02/2029   Pneumococcal Vaccine: 50+ Years  Completed   Influenza Vaccine  Completed   Hepatitis C Screening  Completed   Zoster Vaccines- Shingrix  Completed   Meningococcal B Vaccine  Aged Out        Assessment/Plan:  This is a routine wellness examination for [name].  Patient Care Team: Antonio Meth, Jamee SAUNDERS, DO as PCP - General Shlomo Wilbert SAUNDERS, MD as PCP - Cardiology (Cardiology) Frutoso Luz, MD as Referring Physician (Allergy) Geronimo Amel, MD as Consulting Physician (Pulmonary Disease) Robinson Pao, MD as Consulting Physician (Dermatology) Raelyn [facility])  I have personally reviewed and noted the following in  the patient's chart:   Medical and social history Use of alcohol, tobacco or illicit drugs  Current medications and supplements including opioid prescriptions. Functional ability and status Nutritional status Physical activity Advanced directives List of other physicians Hospitalizations, surgeries, and ER visits in previous 12 months Vitals Screenings to include cognitive, depression, and falls Referrals and appointments  No orders of the defined types were placed in this encounter.  In addition, I have reviewed and discussed with patient certain preventive protocols, quality metrics, and best practice recommendations. A written personalized care plan for preventive services as well as general preventive health recommendations were provided to patient.   Lolita Libra, CMA   01/10/2024   Return in 1 year (on 01/05/2025).  After Visit Summary: (MyChart) Due to this being a telephonic visit, the after visit summary with patients personalized plan was offered to patient via MyChart   Nurse Notes: see phone note

## 2024-02-14 ENCOUNTER — Other Ambulatory Visit: Payer: Self-pay

## 2024-02-14 ENCOUNTER — Other Ambulatory Visit: Payer: Self-pay | Admitting: Family Medicine

## 2024-02-14 DIAGNOSIS — M810 Age-related osteoporosis without current pathological fracture: Secondary | ICD-10-CM

## 2024-03-01 ENCOUNTER — Telehealth: Payer: Self-pay

## 2024-03-01 ENCOUNTER — Other Ambulatory Visit (HOSPITAL_COMMUNITY): Payer: Self-pay

## 2024-03-01 NOTE — Telephone Encounter (Signed)
 Prolia  VOB initiated via [email or web address]  Next Prolia  inj DUE: 03/24/24

## 2024-03-02 ENCOUNTER — Other Ambulatory Visit: Payer: Self-pay

## 2024-03-05 ENCOUNTER — Other Ambulatory Visit: Payer: Self-pay

## 2024-03-07 ENCOUNTER — Other Ambulatory Visit (HOSPITAL_COMMUNITY): Payer: Self-pay

## 2024-03-07 ENCOUNTER — Other Ambulatory Visit: Payer: Self-pay

## 2024-03-07 ENCOUNTER — Other Ambulatory Visit: Payer: Self-pay | Admitting: *Deleted

## 2024-03-07 MED ORDER — PROLIA 60 MG/ML ~~LOC~~ SOSY
60.0000 mg | PREFILLED_SYRINGE | SUBCUTANEOUS | 0 refills | Status: AC
  Filled 2024-03-07 – 2024-03-29 (×3): qty 1, 180d supply, fill #0

## 2024-03-07 NOTE — Telephone Encounter (Signed)
 [name]

## 2024-03-07 NOTE — Telephone Encounter (Signed)
 Pt ready for scheduling for PROLIA  on or after : 03/24/24  Option# 1: Buy/Bill (Office supplied medication)  Out-of-pocket cost due at time of clinic visit: $392  Number of injection/visits approved: 2  Primary: [employer] Prolia  co-insurance: 20% (FOLLOWING MEDICARE GUIDELINE) Admin fee co-insurance: $40  Secondary: --- Prolia  co-insurance:  Admin fee co-insurance:   Medical Benefit Details: Date Benefits were checked: 03/06/24 Deductible: NO/ Coinsurance: 20%/ Admin Fee: $40  Prior Auth: APPROVED PA# [id number] Expiration Date: 02/23/24-02/21/25   # of doses approved: 2 ----------------------------------------------------------------------- Option# 2- Med Obtained from pharmacy:  Pharmacy benefit: Copay $64 (Paid to pharmacy) Admin Fee: $40 (Pay at clinic)  Prior Auth: N/A PA# Expiration Date:   # of doses approved:   If patient wants fill through the pharmacy benefit please send prescription to: [facility], and include estimated need by date in rx notes. Pharmacy will ship medication directly to the office.  Patient NOT eligible for Prolia  Copay Card. Copay Card can make patient's cost as little as $25. Link to apply: [email or web address]://[email or web address]  ** This summary of benefits is an estimation of the patient's out-of-pocket cost. Exact cost may very based on individual plan coverage.

## 2024-03-07 NOTE — Telephone Encounter (Signed)
 SABRA

## 2024-03-15 ENCOUNTER — Other Ambulatory Visit: Payer: Self-pay

## 2024-03-15 ENCOUNTER — Other Ambulatory Visit (HOSPITAL_COMMUNITY): Payer: Self-pay

## 2024-03-19 ENCOUNTER — Other Ambulatory Visit: Payer: Self-pay

## 2024-03-21 ENCOUNTER — Other Ambulatory Visit (HOSPITAL_COMMUNITY): Payer: Self-pay

## 2024-03-26 ENCOUNTER — Ambulatory Visit

## 2024-03-29 ENCOUNTER — Other Ambulatory Visit: Payer: Self-pay

## 2024-03-29 ENCOUNTER — Other Ambulatory Visit: Payer: Self-pay | Admitting: Pharmacy Technician

## 2024-03-29 NOTE — Progress Notes (Signed)
 Specialty Pharmacy Refill Coordination Note  [name] is a 69 y.o. female assessed today regarding refills of clinic administered specialty medication(s) Denosumab  (PROLIA )   Clinic requested Courier to Provider Office   Delivery date: 03/30/24   Verified address: [facility] [address]   Medication will be filled on: 03/30/24

## 2024-03-30 ENCOUNTER — Other Ambulatory Visit: Payer: Self-pay

## 2024-04-02 ENCOUNTER — Ambulatory Visit

## 2024-05-31 ENCOUNTER — Ambulatory Visit: Admitting: Family Medicine

## 2024-06-04 ENCOUNTER — Ambulatory Visit: Admitting: Family Medicine

## 2024-12-03 ENCOUNTER — Encounter: Admitting: Family Medicine

## 2025-01-09 ENCOUNTER — Ambulatory Visit
